# Patient Record
Sex: Male | Born: 1935
Health system: Southern US, Community
[De-identification: ages and names within clinical notes are randomized; demographics above are authoritative.]

## PROBLEM LIST (undated history)

## (undated) ENCOUNTER — Emergency Department (HOSPITAL_COMMUNITY): Discharge status: Absent without leave | Payer: PPO

## (undated) DIAGNOSIS — K219 Gastro-esophageal reflux disease without esophagitis: Secondary | ICD-10-CM

## (undated) DIAGNOSIS — R51 Headache: Secondary | ICD-10-CM

## (undated) DIAGNOSIS — C801 Malignant (primary) neoplasm, unspecified: Secondary | ICD-10-CM

## (undated) DIAGNOSIS — K449 Diaphragmatic hernia without obstruction or gangrene: Secondary | ICD-10-CM

## (undated) DIAGNOSIS — E785 Hyperlipidemia, unspecified: Secondary | ICD-10-CM

## (undated) DIAGNOSIS — J449 Chronic obstructive pulmonary disease, unspecified: Secondary | ICD-10-CM

## (undated) DIAGNOSIS — N400 Enlarged prostate without lower urinary tract symptoms: Secondary | ICD-10-CM

## (undated) DIAGNOSIS — R519 Headache, unspecified: Secondary | ICD-10-CM

## (undated) DIAGNOSIS — H269 Unspecified cataract: Secondary | ICD-10-CM

## (undated) DIAGNOSIS — I509 Heart failure, unspecified: Secondary | ICD-10-CM

## (undated) HISTORY — DX: Heart failure, unspecified: I50.9

## (undated) HISTORY — DX: Hyperlipidemia, unspecified: E78.5

## (undated) HISTORY — PX: OTHER SURGICAL HISTORY: SHX169

## (undated) HISTORY — PX: HERNIA REPAIR: SHX51

## (undated) HISTORY — PX: EYE SURGERY: SHX253

## (undated) HISTORY — DX: Malignant (primary) neoplasm, unspecified: C80.1

## (undated) HISTORY — DX: Benign prostatic hyperplasia without lower urinary tract symptoms: N40.0

## (undated) HISTORY — DX: Headache: R51

## (undated) HISTORY — DX: Unspecified cataract: H26.9

## (undated) HISTORY — DX: Gastro-esophageal reflux disease without esophagitis: K21.9

## (undated) HISTORY — DX: Headache, unspecified: R51.9

## (undated) HISTORY — DX: Diaphragmatic hernia without obstruction or gangrene: K44.9

## (undated) HISTORY — DX: Chronic obstructive pulmonary disease, unspecified: J44.9

---

## 1999-08-13 ENCOUNTER — Encounter (INDEPENDENT_AMBULATORY_CARE_PROVIDER_SITE_OTHER): Payer: Self-pay | Admitting: Specialist

## 1999-08-13 ENCOUNTER — Ambulatory Visit (HOSPITAL_COMMUNITY): Admission: RE | Admit: 1999-08-13 | Discharge: 1999-08-13 | Payer: Self-pay | Admitting: Gastroenterology

## 1999-08-13 ENCOUNTER — Encounter: Payer: Self-pay | Admitting: Gastroenterology

## 1999-10-16 ENCOUNTER — Ambulatory Visit (HOSPITAL_COMMUNITY): Admission: RE | Admit: 1999-10-16 | Discharge: 1999-10-16 | Payer: Self-pay | Admitting: Internal Medicine

## 1999-10-16 ENCOUNTER — Encounter (HOSPITAL_BASED_OUTPATIENT_CLINIC_OR_DEPARTMENT_OTHER): Payer: Self-pay | Admitting: Internal Medicine

## 2000-05-03 ENCOUNTER — Ambulatory Visit (HOSPITAL_COMMUNITY): Admission: RE | Admit: 2000-05-03 | Discharge: 2000-05-03 | Payer: Self-pay

## 2001-11-30 ENCOUNTER — Ambulatory Visit (HOSPITAL_COMMUNITY): Admission: RE | Admit: 2001-11-30 | Discharge: 2001-11-30 | Payer: Self-pay | Admitting: Gastroenterology

## 2001-11-30 ENCOUNTER — Encounter: Payer: Self-pay | Admitting: Gastroenterology

## 2003-08-17 ENCOUNTER — Ambulatory Visit (HOSPITAL_COMMUNITY): Admission: RE | Admit: 2003-08-17 | Discharge: 2003-08-17 | Payer: Self-pay | Admitting: Gastroenterology

## 2004-05-29 ENCOUNTER — Ambulatory Visit: Payer: Self-pay | Admitting: Cardiology

## 2005-01-23 ENCOUNTER — Ambulatory Visit: Payer: Self-pay | Admitting: Gastroenterology

## 2005-01-26 ENCOUNTER — Ambulatory Visit: Payer: Self-pay | Admitting: Gastroenterology

## 2005-03-10 ENCOUNTER — Encounter: Payer: Self-pay | Admitting: Internal Medicine

## 2005-03-10 ENCOUNTER — Ambulatory Visit (HOSPITAL_COMMUNITY): Admission: RE | Admit: 2005-03-10 | Discharge: 2005-03-10 | Payer: Self-pay | Admitting: Family Medicine

## 2005-06-10 ENCOUNTER — Ambulatory Visit (HOSPITAL_COMMUNITY): Admission: RE | Admit: 2005-06-10 | Discharge: 2005-06-10 | Payer: Self-pay | Admitting: Family Medicine

## 2005-06-22 ENCOUNTER — Encounter (HOSPITAL_COMMUNITY): Admission: RE | Admit: 2005-06-22 | Discharge: 2005-09-20 | Payer: Self-pay | Admitting: Dietician

## 2005-12-14 ENCOUNTER — Ambulatory Visit (HOSPITAL_COMMUNITY): Admission: RE | Admit: 2005-12-14 | Discharge: 2005-12-14 | Payer: Self-pay | Admitting: *Deleted

## 2005-12-23 ENCOUNTER — Inpatient Hospital Stay (HOSPITAL_COMMUNITY): Admission: EM | Admit: 2005-12-23 | Discharge: 2005-12-26 | Payer: Self-pay | Admitting: Emergency Medicine

## 2005-12-24 ENCOUNTER — Encounter (INDEPENDENT_AMBULATORY_CARE_PROVIDER_SITE_OTHER): Payer: Self-pay | Admitting: Specialist

## 2005-12-28 ENCOUNTER — Ambulatory Visit: Payer: Self-pay | Admitting: Internal Medicine

## 2006-04-12 ENCOUNTER — Encounter: Payer: Self-pay | Admitting: Internal Medicine

## 2006-06-09 ENCOUNTER — Encounter: Payer: Self-pay | Admitting: Internal Medicine

## 2006-06-09 ENCOUNTER — Ambulatory Visit (HOSPITAL_COMMUNITY): Admission: RE | Admit: 2006-06-09 | Discharge: 2006-06-09 | Payer: Self-pay | Admitting: Family Medicine

## 2007-01-12 ENCOUNTER — Ambulatory Visit: Payer: Self-pay | Admitting: Gastroenterology

## 2007-01-24 ENCOUNTER — Encounter: Payer: Self-pay | Admitting: Internal Medicine

## 2007-01-26 ENCOUNTER — Encounter: Payer: Self-pay | Admitting: Internal Medicine

## 2007-02-09 ENCOUNTER — Ambulatory Visit: Payer: Self-pay | Admitting: Gastroenterology

## 2007-02-09 ENCOUNTER — Encounter: Payer: Self-pay | Admitting: Gastroenterology

## 2007-03-28 DIAGNOSIS — E785 Hyperlipidemia, unspecified: Secondary | ICD-10-CM

## 2007-03-28 DIAGNOSIS — N400 Enlarged prostate without lower urinary tract symptoms: Secondary | ICD-10-CM

## 2007-03-28 DIAGNOSIS — J4489 Other specified chronic obstructive pulmonary disease: Secondary | ICD-10-CM | POA: Insufficient documentation

## 2007-03-28 DIAGNOSIS — K219 Gastro-esophageal reflux disease without esophagitis: Secondary | ICD-10-CM | POA: Insufficient documentation

## 2007-03-28 DIAGNOSIS — J40 Bronchitis, not specified as acute or chronic: Secondary | ICD-10-CM | POA: Insufficient documentation

## 2007-03-28 DIAGNOSIS — J449 Chronic obstructive pulmonary disease, unspecified: Secondary | ICD-10-CM

## 2007-04-28 ENCOUNTER — Ambulatory Visit: Payer: Self-pay | Admitting: Internal Medicine

## 2007-04-28 DIAGNOSIS — J309 Allergic rhinitis, unspecified: Secondary | ICD-10-CM | POA: Insufficient documentation

## 2007-04-28 DIAGNOSIS — J984 Other disorders of lung: Secondary | ICD-10-CM

## 2007-05-26 ENCOUNTER — Ambulatory Visit: Payer: Self-pay | Admitting: Internal Medicine

## 2007-05-26 ENCOUNTER — Encounter: Payer: Self-pay | Admitting: Internal Medicine

## 2007-05-31 ENCOUNTER — Ambulatory Visit: Payer: Self-pay | Admitting: Cardiovascular Disease

## 2007-06-20 ENCOUNTER — Ambulatory Visit: Payer: Self-pay | Admitting: Internal Medicine

## 2007-06-20 DIAGNOSIS — R1310 Dysphagia, unspecified: Secondary | ICD-10-CM | POA: Insufficient documentation

## 2007-09-19 ENCOUNTER — Ambulatory Visit: Payer: Self-pay | Admitting: Internal Medicine

## 2008-02-06 ENCOUNTER — Encounter: Payer: Self-pay | Admitting: Internal Medicine

## 2008-03-22 ENCOUNTER — Ambulatory Visit: Payer: Self-pay | Admitting: Internal Medicine

## 2008-07-18 ENCOUNTER — Encounter: Payer: Self-pay | Admitting: Internal Medicine

## 2009-04-19 ENCOUNTER — Ambulatory Visit: Payer: Self-pay | Admitting: Internal Medicine

## 2009-04-25 ENCOUNTER — Ambulatory Visit: Payer: Self-pay | Admitting: Cardiovascular Disease

## 2009-05-10 ENCOUNTER — Telehealth (INDEPENDENT_AMBULATORY_CARE_PROVIDER_SITE_OTHER): Payer: Self-pay | Admitting: *Deleted

## 2010-04-01 ENCOUNTER — Encounter: Payer: Self-pay | Admitting: Internal Medicine

## 2010-04-08 NOTE — Progress Notes (Signed)
Summary: fax request  Phone Note From Other Clinic   Caller: dr Dorinda Hill moore's office Call For: young Summary of Call: needs CT from 2/17 as well as last ov notes 2/11 faxed to: [phone] Initial call taken by: Tivis Ringer, CNA,  May 10, 2009 10:15 AM  Follow-up for Phone Call        faxed records//Juanita Follow-up by: Darletta Moll,  May 10, 2009 10:21 AM

## 2010-04-08 NOTE — Assessment & Plan Note (Signed)
Summary: 12 months/apc   Copy to:  Dr Jarold Motto Primary Provider/Referring Provider:  Vernon Prey  CC:  Yearly follow up visit.  History of Present Illness: LOV 77 year old man returning for follow up of bronchiectasis and chronic bronchitis.  Sputum culture for AFB was negative.  April 13.  He feels better in dry weather.  Dr.Moore sent old x-ray reports showing scarring in the lung apices.  A CT scan from June 09, 2006 was stable at that time.  Our CT scan of 05/31/2007 showed some bronchitis change and scarring with tree in bud changes consistent with mucous inairways, often a sign of bronchitis, and sometimes an indication of an MAIC.  I think it reflects his chronic bronchitis.  He feels better if he stays out of night air, but says every morning,  he has a congested cough for awhile.  Albuterol irritates his throat but Spiriva helps some.  03/22/08- Rhinitis, COPD,  Bronchitis Notices wheeze early AM. Generally good winter. Avoids "night air". Routine daily productive cough, white mucus. Had seasonal flu vax. Declines H1n1 vax. Meds ok on review. Denies fever, chest pain, sinus discomfort.  April 19, 2009- Rhinitis, COPD, Bronchitis, hx lung nodule "Allergic"  to pneumovax. Had flu vax. Needs refill neb solution. He has been needing it about once daily. If he sits for long he notes joints hurt and he dowsn't breathe as well. Easy dyspnea carrying moderate weight across 20-30 feet. Occasional gas pains. Daily cough productive white mucs. Takes mucinex regularly. Night air and cold air increase his cough. Sneeze with temperature change. He asks status of lung nodule. Has to sleep with extra pillow for orthopnea. Nocturia x 1, without edema. He does a little part time welding, lathing and machine work but doesn't describe it as detrimental to his breathing.     Current Medications (verified): 1)  Caltrate 600+d 600-400 Mg-Unit  Tabs (Calcium Carbonate-Vitamin D) .... Take One Tablet By Mouth  Two Times A Day 2)  Terazosin Hcl 5 Mg  Caps (Terazosin Hcl) .... Take One Capsule By Mouth Once Daily 3)  Proscar 5 Mg  Tabs (Finasteride) .... Take 1 Tablet By Mouth Once A Day 4)  Adult Aspirin Ec Low Strength 81 Mg  Tbec (Aspirin) .... Take 1 Tablet By Mouth Once A Day 5)  Gemfibrozil 600 Mg  Tabs (Gemfibrozil) .... Take 1 Tablet By Mouth Once A Day 6)  Protonix 40 Mg  Pack (Pantoprazole Sodium) .... Take 1 Tablet By Mouth Once A Day 7)  Nasonex 50 Mcg/act  Susp (Mometasone Furoate) .... As Needed 8)  Albuterol Sulfate (2.5 Mg/29ml) 0.083%  Nebu (Albuterol Sulfate) .... One Vial in Nebulizer Every 6 To 8 Hours As Needed 9)  Symbicort 160-4.5 Mcg/act Aero (Budesonide-Formoterol Fumarate) .... 2 Puffs Two Times A Day Rinse Mouth After Use 10)  Mucinex 600 Mg  Tb12 (Guaifenesin) .... Take 1 Tablet By Mouth Once A Day  Allergies (verified): 1)  ! Zocor 2)  ! Zantac 3)  ! Biaxin 4)  ! * Pneumoccal Vaccine  Past History:  Past Medical History: Last updated: 04/28/2007 COPD Pneumonia 2007 Local reaction to pneumovax GERD with esophageal stricture dilatation. Palpitation Recurrent headaches Allergic Rhinitis Hyperlipidemia  Past Surgical History: Last updated: 04/28/2007 Enlarged prostate 3 hernia surgeries  Family History: Last updated: 06/20/2007 Sister- emphysema Brother and son-  died with MI Father- lung ca Mother- ovarian ca brother- liver and colon ca  Social History: Last updated: 04/28/2007 Smoked 20 years, - quit 15 years ago.  Retired Teaching laboratory technician married  Risk Factors: Smoking Status: quit (03/28/2007)  Review of Systems      See HPI       The patient complains of dyspnea on exertion and prolonged cough.  The patient denies anorexia, fever, weight loss, weight gain, vision loss, decreased hearing, hoarseness, chest pain, syncope, peripheral edema, headaches, hemoptysis, and abdominal pain.    Vital Signs:  Patient profile:   77 year old  male Height:      68 inches Weight:      154.50 pounds BMI:     23.58 O2 Sat:      94 % on Room air Pulse rate:   71 / minute BP sitting:   118 / 62  (left arm) Cuff size:   regular  Vitals Entered By: Reynaldo Minium CMA (April 19, 2009 9:41 AM)  O2 Flow:  Room air  Physical Exam  Additional Exam:  General: A/Ox3; pleasant and cooperative, NAD, medium build SKIN: no rash, lesions NODES: no lymphadenopathy HEENT: [place]/AT, EOM- WNL, Conjuctivae- clear, PERRLA, TM-WNL, Nose- clear, Throat- clear and wnl, dentures, [place]  II NECK: Supple w/ fair ROM, JVD- none, normal carotid impulses w/o bruits Thyroid- normal to palpation CHEST: Clear to P&A, minor squeaks and wheeze. HEART: RRR, no m/g/r heard ABDOMEN: Soft and nl; nml bowel sounds; no organomegaly or masses noted ZOX:WRUE, nl pulses, no edema  NEURO: Restless shifting and mild twitching without tremor.      Impression & Recommendations:  Problem # 1:  LUNG NODULE (ICD-518.89)  CT last in 2009 had noted stability of old scarring, but commented on progression of bronchiectasis with some inflammatory tree- in bud changes. We will reassess.  Problem # 2:  COPD (ICD-496) We are refilling neb solution.  Medications Added to Medication List This Visit: 1)  Symbicort 160-4.5 Mcg/act Aero (Budesonide-formoterol fumarate) .... 2 puffs two times a day rinse mouth after use  Other Orders: Est. Patient Level III (45409) Radiology Referral (Radiology)  Patient Instructions: 1)  Schedule return in one year, earlier if needed 2)  Refill neb solution 3)  A Chest CT WITHOUT Contrast has been recommended. Your imaging study may require preauthorization.  Prescriptions: ALBUTEROL SULFATE (2.5 MG/3ML) 0.083%  NEBU (ALBUTEROL SULFATE) one vial in nebulizer every 6 to 8 hours as needed  #50 x prn   Entered and Authorized by:   Waymon Budge MD   Signed by:   Waymon Budge MD on 04/19/2009   Method used:   Print then Give to  Patient   RxID:   [id number]    Immunization History:  Influenza Immunization History:    Influenza:  historical (12/26/2008)

## 2010-04-18 ENCOUNTER — Other Ambulatory Visit: Payer: Self-pay | Admitting: Internal Medicine

## 2010-04-18 ENCOUNTER — Inpatient Hospital Stay: Admission: RE | Admit: 2010-04-18 | Payer: Self-pay | Source: Ambulatory Visit

## 2010-04-18 ENCOUNTER — Encounter: Payer: Self-pay | Admitting: Internal Medicine

## 2010-04-18 ENCOUNTER — Ambulatory Visit (INDEPENDENT_AMBULATORY_CARE_PROVIDER_SITE_OTHER)
Admission: RE | Admit: 2010-04-18 | Discharge: 2010-04-18 | Disposition: A | Discharge status: Home / Self Care | Payer: Medicare Other | Source: Ambulatory Visit | Attending: Internal Medicine | Admitting: Internal Medicine

## 2010-04-18 ENCOUNTER — Ambulatory Visit (INDEPENDENT_AMBULATORY_CARE_PROVIDER_SITE_OTHER): Payer: Medicare Other | Admitting: Internal Medicine

## 2010-04-18 DIAGNOSIS — J449 Chronic obstructive pulmonary disease, unspecified: Secondary | ICD-10-CM

## 2010-04-18 DIAGNOSIS — J309 Allergic rhinitis, unspecified: Secondary | ICD-10-CM

## 2010-04-18 DIAGNOSIS — Z09 Encounter for follow-up examination after completed treatment for conditions other than malignant neoplasm: Secondary | ICD-10-CM

## 2010-04-18 DIAGNOSIS — J40 Bronchitis, not specified as acute or chronic: Secondary | ICD-10-CM

## 2010-04-18 DIAGNOSIS — R0609 Other forms of dyspnea: Secondary | ICD-10-CM

## 2010-04-18 DIAGNOSIS — J984 Other disorders of lung: Secondary | ICD-10-CM

## 2010-04-24 NOTE — Assessment & Plan Note (Signed)
Summary: 1 yr follow up/mbw   Copy to:  Dr Jarold Motto Primary Provider/Referring Provider:  Vernon Prey  CC:  yearly follow up visit-COPD; "always having SOb and wheezing"..  History of Present Illness: 03/22/08- Rhinitis, COPD,  Bronchitis Notices wheeze early AM. Generally good winter. Avoids "night air". Routine daily productive cough, white mucus. Had seasonal flu vax. Declines H1n1 vax. Meds ok on review. Denies fever, chest pain, sinus discomfort.  April 19, 2009- Rhinitis, COPD, Bronchitis, hx lung nodule "Allergic"  to pneumovax. Had flu vax. Needs refill neb solution. He has been needing it about once daily. If he sits for long he notes joints hurt and he dowsn't breathe as well. Easy dyspnea carrying moderate weight across 20-30 feet. Occasional gas pains. Daily cough productive white mucs. Takes mucinex regularly. Night air and cold air increase his cough. Sneeze with temperature change. He asks status of lung nodule. Has to sleep with extra pillow for orthopnea. Nocturia x 1, without edema. He does a little part time welding, lathing and machine work but doesn't describe it as detrimental to his breathing.  April 18, 2010- Rhinitis, COPD, Bronchitis, hx lung nodule Nurse-CC: yearly follow up visit-COPD; "always having SOb and wheezing". CT 2011-IMPRESSION: Reviewed 1.  Stable CT appearance of the chest with chronic lung disease including paraseptal emphysema and biapical scarring. 2.  Chronic bronchiectasis and tree in bud opacity in the right upper lobe compatible with an area of chronic/recurrent infection. 3.  No findings which require specific CT follow-up are identified.// Reports little change in dyspnea or wheeze over several years. not affected by wood shop work. Mostly bothers in his sleep- smothering and some sweats. .  Bedroom kept cool. Denies feet swelling, chest pain- occ chest wall cramp. Daily morning cough. Sputum in past neg for AFB.    Preventive  Screening-Counseling & Management  Alcohol-Tobacco     Smoking Status: quit     Year Quit: 1995  Current Medications (verified): 1)  Caltrate 600+d 600-400 Mg-Unit  Tabs (Calcium Carbonate-Vitamin D) .... Take One Tablet By Mouth Two Times A Day 2)  Terazosin Hcl 5 Mg  Caps (Terazosin Hcl) .... Take One Capsule By Mouth Once Daily 3)  Proscar 5 Mg  Tabs (Finasteride) .... Take 1 Tablet By Mouth Once A Day 4)  Adult Aspirin Ec Low Strength 81 Mg  Tbec (Aspirin) .... Take 1 Tablet By Mouth Once A Day 5)  Gemfibrozil 600 Mg  Tabs (Gemfibrozil) .... Take 1 Tablet By Mouth Once A Day 6)  Protonix 40 Mg  Pack (Pantoprazole Sodium) .... Take 1 Tablet By Mouth Once A Day 7)  Nasonex 50 Mcg/act  Susp (Mometasone Furoate) .... As Needed 8)  Albuterol Sulfate (2.5 Mg/58ml) 0.083%  Nebu (Albuterol Sulfate) .... One Vial in Nebulizer Every 6 To 8 Hours As Needed 9)  Symbicort 160-4.5 Mcg/act Aero (Budesonide-Formoterol Fumarate) .... 2 Puffs Two Times A Day Rinse Mouth After Use 10)  Mucinex 600 Mg  Tb12 (Guaifenesin) .... Take 1 Tablet By Mouth Once A Day  Allergies (verified): 1)  ! Zocor 2)  ! Zantac 3)  ! Biaxin 4)  ! * Pneumoccal Vaccine  Past History:  Past Medical History: Last updated: 04/28/2007 COPD Pneumonia 2007 Local reaction to pneumovax GERD with esophageal stricture dilatation. Palpitation Recurrent headaches Allergic Rhinitis Hyperlipidemia  Past Surgical History: Last updated: 04/28/2007 Enlarged prostate 3 hernia surgeries  Family History: Last updated: 06/20/2007 Sister- emphysema Brother and son-  died with MI Father- lung ca Mother- ovarian  ca brother- liver and colon ca  Social History: Last updated: 04/28/2007 Smoked 20 years, - quit 15 years ago. Retired Teaching laboratory technician married  Risk Factors: Smoking Status: quit (04/18/2010)  Review of Systems       The patient complains of shortness of breath with activity and productive cough.  The  patient denies shortness of breath at rest, non-productive cough, coughing up blood, chest pain, irregular heartbeats, acid heartburn, indigestion, loss of appetite, weight change, abdominal pain, difficulty swallowing, sore throat, tooth/dental problems, headaches, nasal congestion/difficulty breathing through nose, sneezing, hand/feet swelling, joint stiffness or pain, rash, change in color of mucus, and fever.    Vital Signs:  Patient profile:   77 year old male Height:      68 inches Weight:      148.38 pounds BMI:     22.64 O2 Sat:      99 % on Room air Pulse rate:   68 / minute BP sitting:   148 / 60  (right arm) Cuff size:   regular  Vitals Entered By: Reynaldo Minium CMA (April 18, 2010 9:58 AM)  O2 Flow:  Room air CC: yearly follow up visit-COPD; "always having SOb and wheezing".   Physical Exam  Additional Exam:  General: A/Ox3; pleasant and cooperative, NAD, medium build SKIN: no rash, lesions NODES: no lymphadenopathy HEENT: [place]/AT, EOM- WNL, Conjuctivae- clear, PERRLA, TM-WNL, Nose- clear, Throat- clear and wnl, dentures, Mallampati  II NECK: Supple w/ fair ROM, JVD- none, normal carotid impulses w/o bruits Thyroid- normal to palpation CHEST: Clear to P&A, minor squeaks and wheeze. HEART: RRR, no m/g/r heard ABDOMEN: Trim  ZOX:WRUE, nl pulses, no edema  NEURO: Restless shifting and mild twitching without tremor.      Impression & Recommendations:  Problem # 1:  COPD (ICD-496) Chronic bronchitis and empysma. We discussed the suggestion of atypical AFB on previous CT. He uses his meds and nebulizers when he feels he needs. We will update CXR. Consider a f/u CT if there is real concern for progressive bronchiectasis. He emphasizes that he feels quite stable.   Problem # 2:  ALLERGIC RHINITIS (ICD-477.9)  Not currently bothering him so we won't change. His updated medication list for this problem includes:    Nasonex 50 Mcg/act Susp (Mometasone furoate) .Marland Kitchen... As  needed .  Other Orders: Est. Patient Level III (45409) T-2 View CXR (71020TC)  Patient Instructions: 1)  Please schedule a follow-up appointment in 1 year. 2)  A chest x-ray has been recommended.  Your imaging study may require preauthorization.  3)  Continue current meds.

## 2010-06-20 ENCOUNTER — Encounter: Payer: Self-pay | Admitting: Family Medicine

## 2010-07-22 NOTE — Letter (Signed)
February 09, 2007    Evie Lacks, MD  [facility]  [address] [place], [address]  [place], [place] [address]   RE:  Carlos Brooks, Carlos Brooks  MRN:  [id number]  /  DOB:  06/29/1926   Dear Rosanne Ashing:   I would appreciate it if you could see Trecia Rogers for evaluation of  his orogenic oropharyngeal dysphagia.  He has previously seen Casimiro Needle L.  Thad Ranger, M.D. in your office a couple of years ago and plans were to  reassess him in two years.  He continues to have rather severe dysphagia  and episodes of aspiration.  Unfortunately, Mr. Darden does not want to  see Dr. Thad Ranger because of some personality difficulties.  I would  appreciate it if you could see him, review his case, and be sure there  is no progressive neurological disorder accounting for his dysphagia.    Sincerely,      Vania Rea. Jarold Motto, MD, Caleen Essex, FAGA  Electronically Signed    DRP/MedQ  DD: 02/09/2007  DT: 02/09/2007  Job #: [id number]

## 2010-07-25 NOTE — Op Note (Signed)
[facility]  Patient:    Carlos Brooks, Carlos Brooks                    MRN: [id number] Proc. Date: 05/03/00 Adm. Date:  [id number] Attending:  Meredith Leeds                           Operative Report  PREOPERATIVE DIAGNOSIS:  Direct right inguinal hernia.  POSTOPERATIVE DIAGNOSIS:  Direct right inguinal hernia.  OPERATIVE PROCEDURE:  Repair of right inguinal hernia.  SURGEON:  Zigmund Daniel, M.D.  ANESTHESIA:  Local with sedation.  DESCRIPTION OF PROCEDURE:  After the patient was adequately monitored and sedated and after routine preparation and draping the right inguinal region, I liberally infused 0.50% Bupivacaine with epinephrine into the operative field and subsequently gave some local anesthetic as I deepened the dissection. The patient seemed to be comfortable throughout the procedure. I made a slightly oblique skin incision just above and lateral to the pubic tubercle, dissected down through the subcutaneous tissues to the external oblique. I got hemostasis throughout the procedure with the cautery. I opened the external oblique into the external ring, taking care to avoid injury to the ilioinguinal nerve and other cord structures. I then encircled the spermatic cord with a Penrose drain and dissected it up from the inguinal floor and discovered a large direct hernia. I separated that from the cord, reduced it, and held it in with a plug of polypropylene mesh, held in place with a 2-0 silk stitch. I then dissected the proximal part of the spermatic cord and found that there was no evidence of an indirect hernia. I fashioned a patch of polypropylene mesh to fit the inguinal floor with a slit made in the mesh to allow exit of the spermatic cord and then sewed it in from the pubic tubercle medially and superiorly with a basting stitch in the internal oblique fascia and laterally and inferiorly with a running stitch in the inguinal  ligament using 2-0 Prolene suture. I used a single 2-0 Prolene stitch to join the tails of the mesh together just lateral to the spermatic cord, leaving adequate room for exit of the cord. Sponge, needle, lap and instrument counts were correct. I put in a little bit more local anesthetic and assured good hemostasis. I closed the external oblique fascia and subcutaneous tissues with separate running layers of 3-0 Vicryl and I closed the skin with interrupted intracuticular 3-0 Vicryl and Steri-Strips. He tolerated the operation well. DD:  05/03/00 TD:  05/04/00 Job: [id number]

## 2010-07-25 NOTE — Discharge Summary (Signed)
NAMESAHEED, Carlos Brooks             ACCOUNT NO.:  [id number]   MEDICAL RECORD NO.:  [id number]          PATIENT TYPE:  INP   LOCATION:  5732                         FACILITY:  [facility]   PHYSICIAN:  Iva Boop, MD,FACGDATE OF BIRTH:  14-May-1926   DATE OF ADMISSION:  12/23/2005  DATE OF DISCHARGE:  12/26/2005                                 DISCHARGE SUMMARY   ADMITTING DIAGNOSIS:  1. Diarrheal illness, rule out colitis, rule out antritis.  2. History of colon polyps.  Most recent colonoscopy October 2003, at      which time multiple colon polyps and external hemorrhoids noted.  3. History of peptic esophageal stricture.  Latest dilation of esophageal      stricture with an upper endoscopy in November 2006.  4. Hypokalemia.  5. Benign prostatic hypertrophy.  Patient has not undergone TURP but is      status post cystoscopy in the past.  6. Question chronic obstructive pulmonary disease.  7. Stable right lung nodules.  Lingular pulmonary nodules somewhat      increased on recent CT scan of December 14, 2005.  Recommendation was for      follow-up CT in 6 months.  8. History of thyroid nodule.  9. Liver lesions too small to characterize noted on January 2007 CT      abdomen.  10.History of community-acquired pneumonia.  11.Osteoarthritis of the left knee.  12.History of chest pain and palpitations.  Unremarkable treadmill stress      test in 1996 and 2001.  13.Status post single right inguinal hernia repair.  14.Status post left inguinal hernia repair on two occasions.  The patient      thinks that there was mesh enforcement from that second surgery.  15.History of skin reaction owing to pneumococcal vaccine.   DISCHARGE DIAGNOSES:  1. Acute colitis, likely infectious.  Not as likely secondary to      Clostridium Difficile.  2. Hypokalemia, resolved.  3. Minimal anemia, normocytic.   CONSULTATIONS:  None.   PROCEDURES:  Flexible sigmoidoscopy by Dr. Stan Head December 24, 2005.  This showed mild internal hemorrhoids and mild to moderate left-sided  colitis. Appearance of that was that of infectious but not pseudomembranous  colitis.  Activity level was moderate.  Surgical pathology from biopsy of  the region showed active colitis.  Considerations for ischemic colitis  versus NSAID injury were raised on the biopsy.   BRIEF HISTORY:  Carlos Brooks is a pleasant 77 year old white male who is  active and generally healthy.  Beginning 5 days prior to admission, he  developed watery brown, non-bloody stool, associated with fevers, sweats and  rigors. He has become progressively weak.  Some nausea noted of clear watery  emesis.  Also complaining of decreased urine output and low midline  abdominal discomfort.  The patient had not taken any measures to alleviate  the symptoms and did not note increased pain following p.o. intake but stool  output was increased following p.o. intake.   The patient was treated with a course of prednisone about 6 weeks ago for an  upper respiratory infection.  He also notes that, on the day prior to the  onset of symptoms, he did eat a hamburger from a [employer].   LABS:  Hemoglobin 11.5, hematocrit 34.2.  Platelets 290.  White blood cell  count 9.4 with increased monocytes and eosinophils.  Sodium 136, potassium  3.4, corrected to 4.2.  Chloride 102, CO2 28.  Glucose range 92-108.  BUN  14, creatinine 0.8.  Calcium 8.7.  Albumin 3.2, total protein 6.1.  Total  bilirubin 0.6, alkaline phosphatase 50, AST 57, ALT 36.  Stool studies  including culture, C.  Diff, Giardia and Cryptosporidium screen were all  negative.   HOSPITAL COURSE:  1. Acute colitis.  The patient was admitted and placed on a limited clear      liquid diet and IV fluids.  By hospital day 2, he was still passing      multiple loose stools and still feeling quite poorly.  He underwent      flexible sigmoidoscopy on hospital day 2, showing the mildly active       colitis that Dr. Leone Payor felt was due to infectious causes but not      secondary to C.  Diff.  The patient had been started and continued on      IV Cipro and metronidazole, both before and after the flexible      sigmoidoscopy.  At discharge, she was converted over to oral Cipro and      metronidazole to continue an 8-day course.   The patient was with antibiotic therapy and IV fluids.  The patient began to  feel better.  The volume and frequency of bowel movements decreased.  His  appetite was better, and he was felt stable for discharge to home on October  20th.   FOLLOW-UP PLANS:  He is to follow up with Dr. Christell Constant within 2 weeks, and  patient was advised to make that appointment.  He was to call GI physicians  for any severe abdominal pain, blood in stool or return of severe diarrhea.   MEDICATIONS AT DISCHARGE:  Were:  1. Cipro 500 mg twice daily for 8 days.  2. Metronidazole 500 mg twice daily for 8 days.  3. Lomotil 1-2 every 6 hours as needed for diarrhea.  4. Spiriva inhaler 1 puff once daily.  5. Nasonex inhaler 1 puff once daily.  6. Proscar 5 mg once daily.  7. Hytrin 5 mg once daily.  8. Protonix 40 mg once daily.      Jennye Moccasin, PA-C      Iva Boop, MD,FACG  Electronically Signed   SG/MEDQ  D:  01/08/2006  T:  01/08/2006  Job:  [id number]   cc:   Vania Rea. Jarold Motto, MD, Caleen Essex, FAGA  Dr. Christell Constant

## 2010-07-25 NOTE — H&P (Signed)
**Note Carlos Brooks** Carlos Brooks             ACCOUNT NO.:  [id number]   MEDICAL RECORD NO.:  [id number]          PATIENT TYPE:  INP   LOCATION:  5732                         FACILITY:  [facility]   PHYSICIAN:  Iva Boop, MD,FACGDATE OF BIRTH:  07[birth year]27[birth year]   DATE OF ADMISSION:  12/23/2005  DATE OF DISCHARGE:                                HISTORY & PHYSICAL   PRIMARY CARE PHYSICIAN:  Ernestina Penna, M.D.   UROLOGIST:  Maretta Bees. Vonita Moss, M.D.   PULMONOLOGIST:  Caryl Comes. Slotnick, M.D. in [place].   GI PRIMARY:  Vania Rea. Jarold Motto, MD, Clementeen Graham.   CHIEF COMPLAINT:  Several days of diarrhea, weakness, lower abdominal  discomfort.  Recent low potassium on office visits to Dr. Kathi Der office  yesterday.   HISTORY OF PRESENT ILLNESS:  The patient is a nice active generally healthy  77 year old white male.  Since the morning on Saturday which is 5 days prior  to the date of admission, he developed watery, brown, nonbloody stools,  fevers, chills with rigors and sweats.  He feels very weak.  He is nauseated  and either dry heaving or bringing up clear watery deficits.  There is no  blood per rectum or within the emesis. He knows that his urine output has  decreased.  Abdominal pain is in the low midline are and is not severe.   The patient went to Dr. Kathi Der office yesterday.  He was given a bag of IV  fluids; and prescribed Lomotil and Phenergan.  These medications and the IV  fluids helped a little, but he has still has persistent diarrhea and he  feels awful.  The labs, which were obtained came back, and did show a  potassium of 3.0.  It is unclear whether the IV fluids.  She received on the  16th contained potassium or not.  In any event, at re-office visit today,  Dr. Christell Constant was concerned and called [facility] for him to be evaluated here  in [place].  We have advised him to present to the [facility].   The patient has had prior colonoscopy, the latest was in October 2003  at  which time he had multiple colon polyps and external hemorrhoids.  He has  had prior EGDs with dilatation of strictures notably in June of 2001 and  November of 2006.   The patient does say that he ate hamburgers from a placed called [employer]  and from [employer] another restaurant.  The burgers that he had at  [facility] were also shared with cohorts; and none of them are sick.  However, he was the only one who had the hamburger from [facility] that was  within 24 hours of onset of symptoms.  The patient denies the use of  antibiotics recently, but he does think that he has probably had antibiotics  within the last 6 months for upper respiratory infection.  About 6 weeks ago  he was treated with a prednisone taper over about 10-14 days; and says that  it was hard for him to get over his cough.   ALLERGIES:  Known allergies to  pneumococcal vaccine which resulted in a skin  reaction and a large swollen area at the injection site.   MEDICATIONS:  1. Calcium with vitamin D twice daily.  2. Gemfibrozil twice daily.  3. Advair inhaler as needed.  4. Proscar 5 mg once daily.  5. Hytrin 5 mg once daily.  6. Spiriva inhaler 1 puff daily.  7. Nasonex inhaler 1 puff bilaterally once daily.  8. Albuterol nebulizers as needed.  9. Protonix 40 mg a day.  10.Aspirin 81 mg daily.   PAST MEDICAL HISTORY:  1. __________ Question COPD.  2. Lung nodules, those on the right side are stable.  Those in the      lingular area were somewhat increased in size as of a CT chest December 14, 2005 and suggestion was for a followup CT in 6 months.  3. History of thyroid nodule.  4. Liver lesions too small to characterize according to a January 2007 CT      abdomen.  5. Benign prostatic hypertrophy.  He has not had TURP but has undergone      cystoscopy in the past.  6. Community-acquired pneumonia.  7. Status post single right inguinal hernia repair.  8. Status post left inguinal hernia repair x2  occasions.  He thinks there      is mesh in that site.  9. Gastroesophageal reflux disease and esophageal stricture.  10.Colon polyps.  11.Osteoarthritis of the left knee.  12.Chest pain and palpitations.  He had treadmill test in 1996 and 2001      which were unremarkable per notes sent over from Dr. Kathi Der office,      today.   SOCIAL HISTORY:  He is married.  He lives in [place].  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  He has not smoked tobacco for over  20 years.  Does not use alcohol.   FAMILY HISTORY:  Brother was in his 19s when he died with an MI.  Father  died from lung CA.  Mother died from ovarian CA.  Brother died secondary to  colon cancer, and the patient says liver cancer, but it is very possibly  that these were metastasis to the liver from the colon cancer.   REVIEW OF SYSTEMS:  As above, in addition there is been no inappropriate  bleeding.  No inappropriate bruising.  No syncope, though he does feel  tired.  He is not dizzy.  He has had some headaches but no visual changes;  and no unilateral weakness or loss of function of a limbs.  No new cough.  The patient and his wife do describes the patient as being chronically  fidgety in the upper extremities and lower extremities.  Otherwise the  review of systems is unremarkable.   PHYSICAL EXAMINATION:  GENERAL:  The patient is weak and a mildly toxic  looking white male.  He is not obese.  VITAL SIGNS:  Blood pressure 125/79, pulse 75, temperature 97.8,  respirations 20, room air saturation 94%.  HEENT EXAM:  There is no icterus, no pallor, and extraocular movements are  intact.  Oropharynx is dry and clear.  Teeth and dentition are well  repaired.  NECK:  No masses, no JVD, no bruits.  CHEST:  There is scattered scant wheezing.  The patient is NOT short of  breath and there is no cough with deep inspiration. COR:  There is regular rate and rhythm.  No murmurs, rubs, or  gallops.  ABDOMEN:   Soft, quiet, nondistended.  He is tender in the lower abdomen to  mid abdomen bilaterally; and this is greater on the left side.  There is no  guarding or rebound.  No masses, no hepatosplenomegaly, no bruits.  There is  no evidence for recurrent hernias in the groin.  RECTAL EXAM:  No stool no blood and the rectal area looks within normal  limits without erythema.  GU EXAM:  There is no scrotal edema.  EXTREMITIES:  No cyanosis, clubbing or edema.  LYMPH NODES:  No subclavicular or cervical adenopathy.  NEUROLOGIC:  The patient is alert and oriented x3.  He is restless and  moving his upper extremities and lower extremities about, but this is under  voluntary control and he can stop this at will.  PSYCHIATRIC:  The patient is appropriate and pleasant.  DERMATOLOGIC:  Patient is sun tanned.  There is no obvious gross lesions or  rashes on the trunk, lower extremities, or upper extremities.   IMPRESSION:  1. Diarrheal illness, rule out colitis, rule out enteritis.  2. History of colon polyps.  3. History of gastroesophageal reflux disease and esophageal strictures,      dilated in the past.  4. Asthmatic bronchitis.  Stable right lung nodules and somewhat enlarging      lingular lung nodules per CT scan October 8.  He will need a 28-month      follow up CT according to the radiologist.  5. Left thyroid nodule, unknown entity.  6. Status post multiple inguinal hernia repairs.   PLAN:  The patient to be admitted for supportive care including IV fluids,  empiric Flagyl because he may have C. diff.  Provide bowel rest provide  analgesia as needed.  If patient's symptoms do not improve, he will probably  need a flexible sigmoidoscopy in the morning.     ______________________________  Jennye Moccasin, PA-C      Iva Boop, MD,FACG  Electronically Signed    SG/MEDQ  D:  12/24/2005  T:  12/25/2005  Job:  [id number]   cc:   Ernestina Penna, M.D.

## 2010-07-25 NOTE — Op Note (Signed)
[facility]  Patient:    Carlos Brooks, Carlos Brooks                    MRN: [id number] Proc. Date: 08/13/99 Adm. Date:  [id number] Disc. Date: [id number] Attending:  Mardella Layman CC:         Vania Rea. Jarold Motto, M.D. [facility]             Monica Becton, M.D.             Clinton D. Maple Hudson, M.D.                           Operative Report  PROCEDURE:  Outpatient endoscopy and Savary dilation.  INDICATION:  Carlos Brooks is a 77 year old white male with asthmatic chronic bronchitis.  I have seen him in the past for colonic polyposis.  He has a long history of acid reflux disease, was seen in my office on Jul 29, 1999, complaining of progressive solid food dysphagia.  He was restarted on Aciphex 20 mg a day and was scheduled for endoscopy with Savary dilation.  The risks and benefits of this procedure were explained in detail, and he agreed to proceed as planned.  Preoperative cardiopulmonary and mental status exams were unremarkable except for scattered wheezes in both lung fields.  ENDOSCOPY REPORT:  Throughout this procedure, the patient was on pulse oximetry and cardiac monitoring.  He tolerated this procedure well, receiving supplemental low-flow oxygen by nasal cannula throughout the procedure.  He was anesthetized with Cetacaine spray to the oropharynx, fentanyl 50 mcg IV, and Versed 3 mg IV. He was intubated using the Olympus adult video endoscope. Hypopharynx and vocal cords appeared normal.  The esophagus was generally unremarkable except for what appeared to be a possible short segment of Barretts mucosa in the distal esophagus with some nodularity and granularity. There appeared to be a smooth benign stricture of the gastroesophageal junction with a 4-5 cm hiatal hernia present.  The scope passed easily into the stomach.  There were no retained food products or blood in the stomach. The mucosal pattern of the fundus, body, antrum, pylorus, duodenal  bulb, duodenal sweep was normal.  Retroflexed view of the fundus and cardia of the stomach otherwise was unremarkable.  The endoscope was then placed in the antrum of the stomach.  With fluoroscopic control, a floppy-tipped guidewire was inserted through the inner lumen of the endoscope.  The scope was then withdrawn over the guidewire.  With fluoroscopic control, he was then dilated with a #17, and then #18 mm Savary dilators.  These were seen fluoroscopically to pass across the gastroesophageal junction into the stomach.  The patient was then extubated without difficulty.  He tolerated the procedure well.  He was returned in stable condition to the recovery room for observation.  ASSESSMENT: 1. Moderate-sized hiatal hernia with chronic gastroesophageal reflux disease,    rule out Barretts mucosa. 2. Peptic stricture of the distal esophagus secondary to chronic acid reflux. 3. Status post Savary esophageal dilatation.  RECOMMENDATIONS: 1. Strict antireflux regimen along with daily PPI therapy. 2. Follow up on esophageal biopsies. 3. Office follow-up in six weeks- time. DD:  08/13/99 TD:  08/17/99 Job: [id number]

## 2010-09-15 ENCOUNTER — Encounter: Payer: Self-pay | Admitting: Cardiology

## 2011-04-14 ENCOUNTER — Encounter: Payer: Self-pay | Admitting: Internal Medicine

## 2011-04-14 ENCOUNTER — Ambulatory Visit (INDEPENDENT_AMBULATORY_CARE_PROVIDER_SITE_OTHER): Payer: Medicare Other | Admitting: Internal Medicine

## 2011-04-14 VITALS — BP 130/86 | HR 73 | Ht 68.0 in | Wt 152.0 lb

## 2011-04-14 DIAGNOSIS — J4489 Other specified chronic obstructive pulmonary disease: Secondary | ICD-10-CM

## 2011-04-14 DIAGNOSIS — J449 Chronic obstructive pulmonary disease, unspecified: Secondary | ICD-10-CM

## 2011-04-14 MED ORDER — FLUTICASONE-SALMETEROL 45-21 MCG/ACT IN AERO: INHALATION_SPRAY | RESPIRATORY_TRACT | Status: DC

## 2011-04-14 MED ORDER — FAMOTIDINE 20 MG PO TABS: ORAL_TABLET | ORAL | Status: DC

## 2011-04-14 MED ORDER — PREDNISONE (PAK) 10 MG PO TABS: ORAL_TABLET | ORAL | Status: AC

## 2011-04-14 NOTE — Progress Notes (Signed)
  Subjective:    Patient ID: Carlos Brooks, male    DOB: Apr 24, 1926   MRN: [id number]  HPI   Brief patient profile:  46 yowm followed by Dr Maple Hudson with dx of  GOLD II copd 2009  requested transfer care to [facility] office 04/14/2011    04/14/2011 1st pulmonary ov  cc cough daily x 10 years  onset well after stopped smoking with production of  > one tbsp each am  worse with  Winter uri's (but no exacerbations for last year)  c/o cough all day long but not typically disturbing sleep. Has symbicort but  doesn't seem to help and not able to use it constently due to throat irritation.   Has prilosec takes after breakfast daily  Also c/o DOE  X fast walk but not moderate pace x years, occ wakes up at night with breathing difficulty but uses vicks savve and p  usesit on his chest goes back to sleep  and does fine. Has not used the nebulizer in a month but helps when he uses it  Also denies any obvious fluctuation of symptoms with weather or environmental changes or other aggravating or alleviating factors except as outlined above    Review of Systems  Constitutional: Negative for fever and unexpected weight change.  HENT: Positive for congestion and sneezing. Negative for ear pain, nosebleeds, sore throat, rhinorrhea, trouble swallowing, dental problem, postnasal drip and sinus pressure.   Eyes: Negative for redness and itching.  Respiratory: Positive for cough and shortness of breath. Negative for chest tightness and wheezing.   Cardiovascular: Positive for chest pain. Negative for palpitations and leg swelling.  Gastrointestinal: Negative for nausea and vomiting.  Genitourinary: Negative for dysuria.  Musculoskeletal: Negative for joint swelling.  Skin: Negative for rash.  Neurological: Negative for headaches.  Hematological: Does not bruise/bleed easily.  Psychiatric/Behavioral: Negative for dysphoric mood. The patient is not nervous/anxious.        Objective:   Physical Exam  Hoarse amb  wm nad  04/14/2011  152   HEENT mild turbinate edema.  Oropharynx no thrush or excess pnd or cobblestoning.  No JVD or cervical adenopathy. Mild accessory muscle hypertrophy. Trachea midline, nl thryroid. Chest was hyperinflated by percussion with diminished breath sounds and moderate increased exp time with mid exp bilateral  wheeze. Hoover sign positive at mid inspiration. Regular rate and rhythm without murmur gallop or rub or increase P2 or edema.  Abd: no hsm, nl excursion. Ext warm without cyanosis or clubbing.        Assessment & Plan:

## 2011-04-14 NOTE — Patient Instructions (Signed)
Advair 45/21 one twice daily perfectly regularly   Work on inhaler technique:  relax and gently blow all the way out then take a nice smooth deep breath back in, triggering the inhaler at same time you start breathing in.  Hold for up to 5 seconds if you can.  Rinse and gargle with water when done   If your mouth or throat starts to bother you,   I suggest you time the inhaler to your dental care and after using the inhaler(s) brush teeth and tongue with a baking soda containing toothpaste and when you rinse this out, gargle with it first to see if this helps your mouth and throat.     Take prilosec (omeprazole) 40 mg Take 30-60 min before first meal of the day and pepcid 20 mg one at bedtime   GERD (REFLUX)  is an extremely common cause of respiratory symptoms, many times with no significant heartburn at all.    It can be treated with medication, but also with lifestyle changes including avoidance of late meals, excessive alcohol, smoking cessation, and avoid fatty foods, chocolate, peppermint, colas, red wine, and acidic juices such as orange juice.  NO MINT OR MENTHOL PRODUCTS SO NO COUGH DROPS  USE SUGARLESS CANDY INSTEAD (jolley ranchers or Stover's)  NO OIL BASED VITAMINS - use powdered substitutes.  Please schedule a follow up office visit in 4 weeks, sooner if needed

## 2011-04-14 NOTE — Assessment & Plan Note (Addendum)
-   pft's 05/2007   FEV1 1.94 (77%) ratio 58 no better p B2 and dlco 90%   - HFA 50% p coaching 04/14/2011   DDX of  difficult airways managment all start with A and  include Adherence, Ace Inhibitors, Acid Reflux, Active Sinus Disease, Alpha 1 Antitripsin deficiency, Anxiety masquerading as Airways dz,  ABPA,  allergy(esp in young), Aspiration (esp in elderly), Adverse effects of DPI,  Active smokers, plus two Bs  = Bronchiectasis and Beta blocker use..and one C= CHF   Adherence is always the initial "prime suspect" and is a multilayered concern that requires a "trust but verify" approach in every patient - starting with knowing how to use medications, especially inhalers, correctly, keeping up with refills and understanding the fundamental difference between maintenance and prns vs those medications only taken for a very short course and then stopped and not refilled. The proper method of use, as well as anticipated side effects, of this metered-dose inhaler are discussed and demonstrated to the patient. Improved only to 50% with coaching.  Try advair hfa lowest strength and frequency to see if can master hfa and if can tolerate it; otherwise consider change to neb formoterol and budesonide  ? Acid reflux > rec max gerd rx and diet to try to eliminate this as a factor in some of his non-specific symptoms

## 2011-04-16 ENCOUNTER — Telehealth: Payer: Self-pay | Admitting: Internal Medicine

## 2011-04-16 NOTE — Telephone Encounter (Signed)
Received copies from [facility],on 04/16/11. Forwarded  4pages to Dr. Rudell Cobb review.

## 2011-04-20 ENCOUNTER — Ambulatory Visit: Payer: Medicare Other | Admitting: Internal Medicine

## 2011-05-12 ENCOUNTER — Encounter: Payer: Self-pay | Admitting: Internal Medicine

## 2011-05-12 ENCOUNTER — Ambulatory Visit (INDEPENDENT_AMBULATORY_CARE_PROVIDER_SITE_OTHER): Payer: Medicare Other | Admitting: Internal Medicine

## 2011-05-12 VITALS — BP 138/80 | HR 76 | Temp 97.0°F | Ht 68.0 in | Wt 152.0 lb

## 2011-05-12 DIAGNOSIS — J449 Chronic obstructive pulmonary disease, unspecified: Secondary | ICD-10-CM

## 2011-05-12 DIAGNOSIS — J4489 Other specified chronic obstructive pulmonary disease: Secondary | ICD-10-CM

## 2011-05-12 MED ORDER — FLUTICASONE-SALMETEROL 115-21 MCG/ACT IN AERO: INHALATION_SPRAY | RESPIRATORY_TRACT | Status: DC

## 2011-05-12 MED ORDER — LEVALBUTEROL TARTRATE 45 MCG/ACT IN AERO: 1.0000 | INHALATION_SPRAY | RESPIRATORY_TRACT | Status: DC | PRN

## 2011-05-12 NOTE — Assessment & Plan Note (Signed)
-   pft's 05/2007   FEV1 1.94 (77%) ratio 58 no better p B2 and dlco 90%   - HFA 50% p coaching 04/14/2011  > 75% 05/12/2011   GOLD II copd with symptoms improved on low dose advair hfa and on gerd rx.  The proper method of use, as well as anticipated side effects, of this metered-dose inhaler are discussed and demonstrated to the patient. Improved to 75% with coaching so try the higher strength advair 115/41 one bid to see whether cough is completely eliminated in a dose - response fashion.    Each maintenance medication was reviewed in detail including most importantly the difference between maintenance and as needed and under what circumstances the prns are to be used.  Please see instructions for details which were reviewed in writing and the patient given a copy.

## 2011-05-12 NOTE — Progress Notes (Signed)
  Subjective:    Patient ID: Carlos Brooks, male    DOB: 16-Feb-1927   MRN: [id number]  HPI   Brief patient profile:  36 yowm followed by Dr Maple Hudson with dx of  GOLD II copd 2009  requested transfer care to [facility] office 04/14/2011    04/14/2011 1st pulmonary ov  cc cough daily x 10 years  onset well after stopped smoking with production of  > one tbsp each am  worse with  Winter uri's (but no exacerbations for last year)  c/o cough all day long but not typically disturbing sleep. Has symbicort but  doesn't seem to help and not able to use it constently due to throat irritation.   Has prilosec takes after breakfast daily  Also c/o DOE  X fast walk but not moderate pace x years, occ wakes up at night with breathing difficulty but uses vicks savve and p  usesit on his chest goes back to sleep  and does fine. Has not used the nebulizer in a month but helps when he uses it rec Advair 45/21 one twice daily perfectly regularly  Work on inhaler technique:   Take prilosec (omeprazole) 40 mg Take 30-60 min before first meal of the day and pepcid 20 mg one at bedtime   GERD  diet  05/12/2011 f/u ov/Carlos Brooks cc much better cough and breathing on advair 45/21 one bid and hoarsness less but still present on max gerd rx.  No need for daytime rescue saba, not really limited from desired activities by sob  Sleeping ok without nocturnal  or early am exacerbation  of respiratory  c/o's or need for noct saba. Also denies any obvious fluctuation of symptoms with weather or environmental changes or other aggravating or alleviating factors except as outlined above   ROS  At present neg for  any significant sore throat, dysphagia, itching, sneezing,  nasal congestion or excess/ purulent secretions,  fever, chills, sweats, unintended wt loss, pleuritic or exertional cp, hempoptysis, orthopnea pnd or leg swelling.  Also denies presyncope, palpitations, heartburn, abdominal pain, nausea, vomiting, diarrhea  or change in bowel  or urinary habits, dysuria,hematuria,  rash, arthralgias, visual complaints, headache, numbness weakness or ataxia.             Objective:   Physical Exam  Minimally hoarse  amb wm nad  04/14/2011  152  > 152  05/12/2011   HEENT mild turbinate edema.  Oropharynx no thrush or excess pnd or cobblestoning.  No JVD or cervical adenopathy. Mild accessory muscle hypertrophy. Trachea midline, nl thryroid. Chest was hyperinflated by percussion with diminished breath sounds and moderate increased exp time with mid exp bilateral  wheeze. Hoover sign positive at mid inspiration. Regular rate and rhythm without murmur gallop or rub or increase P2 or edema.  Abd: no hsm, nl excursion. Ext warm without cyanosis or clubbing.       cxr reviewed 04/18/10 IMPRESSION: No acute cardiopulmonary disease. Emphysema and  chronic biapical pleuroparenchymal scarring.  Assessment & Plan:

## 2011-05-12 NOTE — Patient Instructions (Addendum)
For cough [place] mucinex every 12 hours as needed  Think of your respiratory medications as multiple steps you can take to control your symptoms and avoid having to go to the ER   Plan A is your maintenance daily no matter what meds: Advair 115/21 one puff first thing  each am and one each pm Plan B only use after you've used your maintenance (Plan A) medication, and only if you can't catch your breath: xopenex HFA up to 2 puffs every  4hous Plan C only use after you've used plan A and B and still can't catch your breath: Nebulizer Xopenex up to 4 hours  Plan D(for Doctor):  If you've used A thru C and not doing a lot better or still needing C more than a once a day,  D = call the doctor for evaluation asap Plan E (for ER):  If still not able to catch your breath, even after using your nebulizer up to every 4 hours, go to ER   Please schedule a follow up office visit in 4 weeks, sooner if needed in [place] office

## 2011-06-09 ENCOUNTER — Ambulatory Visit (INDEPENDENT_AMBULATORY_CARE_PROVIDER_SITE_OTHER): Payer: Medicare Other | Admitting: Internal Medicine

## 2011-06-09 ENCOUNTER — Encounter: Payer: Self-pay | Admitting: Internal Medicine

## 2011-06-09 VITALS — BP 110/70 | HR 74 | Temp 97.8°F | Ht 68.0 in | Wt 151.0 lb

## 2011-06-09 DIAGNOSIS — J449 Chronic obstructive pulmonary disease, unspecified: Secondary | ICD-10-CM

## 2011-06-09 MED ORDER — MOMETASONE FURO-FORMOTEROL FUM 100-5 MCG/ACT IN AERO: INHALATION_SPRAY | RESPIRATORY_TRACT | Status: DC

## 2011-06-09 NOTE — Progress Notes (Signed)
Subjective:    Patient ID: Carlos Brooks, male    DOB: 1926/06/12   MRN: [id number]  HPI   Brief patient profile:  37 yowm quit smoking 1995  followed by Dr Maple Hudson with dx of  GOLD II copd 2009  requested transfer care to [facility] office 04/14/2011    04/14/2011 1st pulmonary ov  cc cough daily x 10 years  onset well after stopped smoking with production of  > one tbsp each am  worse with  Winter uri's (but no exacerbations for last year)  c/o cough all day long but not typically disturbing sleep. Has symbicort but  doesn't seem to help and not able to use it constently due to throat irritation.   Has prilosec takes after breakfast daily  Also c/o DOE  X fast walk but not moderate pace x years, occ wakes up at night with breathing difficulty but uses vicks savve and p  usesit on his chest goes back to sleep  and does fine. Has not used the nebulizer in a month but helps when he uses it rec Advair 45/21 one twice daily perfectly regularly  Work on inhaler technique:   Take prilosec (omeprazole) 40 mg Take 30-60 min before first meal of the day and pepcid 20 mg one at bedtime   GERD  diet  05/12/2011 f/u ov/Atul Delucia cc much better cough and breathing on advair 45/21 one bid and hoarsness less but still present on max gerd rx.  No need for daytime rescue saba, not really limited from desired activities by sob rec For cough [place] mucinex every 12 hours as needed Plan A is your maintenance daily no matter what meds: Advair 115/21 one puff first thing  each am and one each pm Plan B only use after you've used your maintenance (Plan A) medication, and only if you can't catch your breath: xopenex HFA up to 2 puffs every  4hous Plan C only use after you've used plan A and B and still can't catch your breath: Nebulizer Xopenex up to 4 hours  Plan D(for Doctor):  If you've used A thru C and not doing a lot better or still needing C more than a once a day,  D = call the doctor for evaluation asap Plan E (for  ER):   06/09/2011 f/u ov/Aaren Krog cc breathing much better, minimal daytime cough, no excess mucus or limiting activities but still feels needs mucinex daily- no need for saba hfa only, never neb  Sleeping ok without nocturnal  or early am exacerbation  of respiratory  c/o's or need for noct saba. Also denies any obvious fluctuation of symptoms with weather or environmental changes or other aggravating or alleviating factors except as outlined above   ROS  At present neg for  any significant sore throat, dysphagia, itching, sneezing,  nasal congestion or excess/ purulent secretions,  fever, chills, sweats, unintended wt loss, pleuritic or exertional cp, hempoptysis, orthopnea pnd or leg swelling.  Also denies presyncope, palpitations, heartburn, abdominal pain, nausea, vomiting, diarrhea  or change in bowel or urinary habits, dysuria,hematuria,  rash, arthralgias, visual complaints, headache, numbness weakness or ataxia.             Objective:   Physical Exam  Minimally hoarse  amb wm nad  04/14/2011  152  > 152  05/12/2011 > 151 06/09/2011   HEENT mild turbinate edema.  Oropharynx no thrush or excess pnd or cobblestoning.  No JVD or cervical adenopathy. Mild accessory muscle hypertrophy. Trachea  midline, nl thryroid. Chest was hyperinflated by percussion with diminished breath sounds and moderate increased exp time with mid exp bilateral  wheeze. Hoover sign positive at mid inspiration. Regular rate and rhythm without murmur gallop or rub or increase P2 or edema.  Abd: no hsm, nl excursion. Ext warm without cyanosis or clubbing.       cxr reviewed 04/18/10 IMPRESSION: No acute cardiopulmonary disease. Emphysema and  chronic biapical pleuroparenchymal scarring.  Assessment & Plan:

## 2011-06-09 NOTE — Patient Instructions (Signed)
dulera 100 one twice daily should do everything the advair hfa did and also reduce your cough - if not ok to continue advair  Please schedule a follow up visit in 3 months but call sooner if needed

## 2011-06-09 NOTE — Assessment & Plan Note (Addendum)
-   pft's 05/2007   FEV1 1.94 (77%) ratio 58 no better p B2 and dlco 90%   - HFA 90% p teaching 06/09/2011   GOLD II and clinical element of asthma and  Classic Upper airway cough syndrome, so named because it's frequently impossible to sort out how much is  CR/sinusitis with freq throat clearing (which can be related to primary GERD)   vs  causing  secondary (" extra esophageal")  GERD from wide swings in gastric pressure that occur with throat clearing, often  promoting self use of mint and menthol lozenges that reduce the lower esophageal sphincter tone and exacerbate the problem further in a cyclical fashion.   These are the same pts (now being labeled as having "irritable larynx syndrome" by some cough centers) who not infrequently have a history of having failed to tolerate ace inhibitors,  dry powder inhalers or biphosphonates or report having atypical reflux symptoms that don't respond to standard doses of PPI , and are easily confused as having aecopd or asthma flares by even experienced allergists/ pulmonologists.  To try tease out the real from pseudoasthma rec trial of dulera 100 one bid and in meantime emphasized continued diet/ ppi to control GERD    Each maintenance medication was reviewed in detail including most importantly the difference between maintenance and as needed and under what circumstances the prns are to be used.  Please see instructions for details which were reviewed in writing and the patient given a copy.    The proper method of use, as well as anticipated side effects, of a metered-dose inhaler are discussed and demonstrated to the patient. Improved effectiveness after extensive coaching during this visit to a level of approximately  90%

## 2011-07-20 ENCOUNTER — Ambulatory Visit
Admission: RE | Admit: 2011-07-20 | Discharge: 2011-07-20 | Disposition: A | Discharge status: Home / Self Care | Payer: Medicare Other | Source: Ambulatory Visit | Attending: Family Medicine | Admitting: Family Medicine

## 2011-07-20 ENCOUNTER — Other Ambulatory Visit: Payer: Self-pay | Admitting: Family Medicine

## 2011-07-20 DIAGNOSIS — R51 Headache: Secondary | ICD-10-CM

## 2011-10-13 ENCOUNTER — Ambulatory Visit (INDEPENDENT_AMBULATORY_CARE_PROVIDER_SITE_OTHER): Payer: Medicare Other | Admitting: Internal Medicine

## 2011-10-13 ENCOUNTER — Encounter: Payer: Self-pay | Admitting: Internal Medicine

## 2011-10-13 VITALS — BP 136/80 | HR 81 | Temp 97.8°F | Ht 68.0 in | Wt 154.0 lb

## 2011-10-13 DIAGNOSIS — J449 Chronic obstructive pulmonary disease, unspecified: Secondary | ICD-10-CM

## 2011-10-13 MED ORDER — MOMETASONE FURO-FORMOTEROL FUM 100-5 MCG/ACT IN AERO: INHALATION_SPRAY | RESPIRATORY_TRACT | Status: DC

## 2011-10-13 NOTE — Assessment & Plan Note (Signed)
-   pft's 05/2007   FEV1 1.94 (77%) ratio 58 no better p B2 and dlco 90%   - HFA 90% p teaching 06/09/2011   GOLD II and only bothersome symptom is cough which was no better on low dose dulera than low dose advair HFA.  Since advair more assoc with a dose resp curve for cough than dulera would like him to try 100 dosed 2 bid and even then maybe the 200 2bid before giving in and putting up with the chronic am need for mucinex for cough and congestion  See instructions for specific recommendations which were reviewed directly with the patient who was given a copy with highlighter outlining the key components.

## 2011-10-13 NOTE — Progress Notes (Signed)
Subjective:    Patient ID: Carlos Brooks, male    DOB: 20-Dec-1926   MRN: [id number]  HPI   Brief patient profile:  89 yowm quit smoking 1995  followed by Dr Maple Hudson with dx of  GOLD II copd 2009  requested transfer care to [facility] office 04/14/2011 with predominant symptom of cough and doe x fast walk.   04/14/2011 1st pulmonary ov  cc cough daily x 10 years  onset well after stopped smoking with production of  > one tbsp each am  worse with  Winter uri's (but no exacerbations for last year)  c/o cough all day long but not typically disturbing sleep. Has symbicort but  doesn't seem to help and not able to use it constently due to throat irritation.   Has prilosec takes after breakfast daily  Also c/o DOE  X fast walk but not moderate pace x years, occ wakes up at night with breathing difficulty but uses vicks savve and p  usesit on his chest goes back to sleep  and does fine. Has not used the nebulizer in a month but helps when he uses it rec Advair 45/21 one twice daily perfectly regularly  Work on inhaler technique:   Take prilosec (omeprazole) 40 mg Take 30-60 min before first meal of the day and pepcid 20 mg one at bedtime   GERD  diet  05/12/2011 f/u ov/Wert cc much better cough and breathing on advair 45/21 one bid and hoarsness less but still present on max gerd rx.  No need for daytime rescue saba, not really limited from desired activities by sob rec For cough [place] mucinex every 12 hours as needed Plan A is your maintenance daily no matter what meds: Advair 115/21 one puff first thing  each am and one each pm Plan B only use after you've used your maintenance (Plan A) medication, and only if you can't catch your breath: xopenex HFA up to 2 puffs every  4hous Plan C only use after you've used plan A and B and still can't catch your breath: Nebulizer Xopenex up to 4 hours  Plan D(for Doctor):  If you've used A thru C and not doing a lot better or still needing C more than a once a day,  D  = call the doctor for evaluation asap Plan E (for ER):   06/09/2011 f/u ov/Wert cc breathing much better, minimal daytime cough, no excess mucus or limiting activities but still feels needs mucinex daily- no need for saba hfa only, never neb. rec dulera 100 one twice daily should do everything the advair hfa did and also reduce your cough - if not ok to continue advair    10/13/2011 f/u ov/Wert cc chronic cough/congestion no change with dulera 100 so back on advair and takes mucinex one each am and occasional xopenex but never nebulizer and not really limited by sob at this point.  No   purulent sputum or sinus/hb symptoms on present rx.  Sleeping ok without nocturnal  or early am exacerbation  of respiratory  c/o's or need for noct saba. Also denies any obvious fluctuation of symptoms with weather or environmental changes or other aggravating or alleviating factors except as outlined above   ROS  The following are not active complaints unless bolded sore throat, dysphagia, dental problems, itching, sneezing,  nasal congestion or excess/ purulent secretions, ear ache,   fever, chills, sweats, unintended wt loss, pleuritic or exertional cp, hemoptysis,  orthopnea pnd or leg swelling,  presyncope, palpitations, heartburn, abdominal pain, anorexia, nausea, vomiting, diarrhea  or change in bowel or urinary habits, change in stools or urine, dysuria,hematuria,  rash, arthralgias, visual complaints, headache, numbness weakness or ataxia or problems with walking or coordination,  change in mood/affect or memory.    .             Objective:   Physical Exam  Minimally hoarse  amb wm nad  04/14/2011  152  > 152  05/12/2011 > 151 06/09/2011 > 10/13/2011  154   HEENT mild turbinate edema.  Oropharynx no thrush or excess pnd or cobblestoning.  No JVD or cervical adenopathy. Mild accessory muscle hypertrophy. Trachea midline, nl thryroid. Chest was hyperinflated by percussion with diminished breath sounds and  moderate increased exp time with mid exp bilateral  wheeze. Hoover sign positive at mid inspiration. Regular rate and rhythm without murmur gallop or rub or increase P2 or edema.  Abd: no hsm, nl excursion. Ext warm without cyanosis or clubbing.       cxr reviewed 04/18/10 IMPRESSION: No acute cardiopulmonary disease. Emphysema and  chronic biapical pleuroparenchymal scarring.  Assessment & Plan:

## 2011-10-13 NOTE — Patient Instructions (Addendum)
Dulera 100 Take 2 puffs first thing in am and then another 2 puffs about 12 hours later and if you are better then fill the prescription, if not > resume advair  CxR at this point is optional in terms of your care plan  Return every 6 months, sooner if needed

## 2011-12-08 ENCOUNTER — Telehealth: Payer: Self-pay | Admitting: *Deleted

## 2011-12-08 NOTE — Telephone Encounter (Signed)
Dr Kathi Der office called, Joyce Gross to report pt's reflux is getting worse and Dr Christell Constant increased his Omeprazole to 40mg  BID. Dr Christell Constant wants to know if Dr Jarold Motto will add a, EGD to the COLON recall due in 01/2012? Dr Jarold Motto, OK to add EGD to COLON? Thanks.

## 2011-12-16 NOTE — Telephone Encounter (Signed)
Recall added

## 2011-12-16 NOTE — Telephone Encounter (Signed)
yes

## 2012-02-16 ENCOUNTER — Ambulatory Visit (INDEPENDENT_AMBULATORY_CARE_PROVIDER_SITE_OTHER): Payer: Medicare Other | Admitting: Cardiology

## 2012-02-16 ENCOUNTER — Encounter: Payer: Self-pay | Admitting: Cardiology

## 2012-02-16 VITALS — BP 140/72 | HR 54 | Resp 16 | Ht 67.0 in | Wt 152.0 lb

## 2012-02-16 DIAGNOSIS — J449 Chronic obstructive pulmonary disease, unspecified: Secondary | ICD-10-CM

## 2012-02-16 DIAGNOSIS — K219 Gastro-esophageal reflux disease without esophagitis: Secondary | ICD-10-CM

## 2012-02-16 DIAGNOSIS — E7800 Pure hypercholesterolemia, unspecified: Secondary | ICD-10-CM

## 2012-02-16 DIAGNOSIS — J4489 Other specified chronic obstructive pulmonary disease: Secondary | ICD-10-CM

## 2012-02-16 NOTE — Assessment & Plan Note (Signed)
Patient is followed closely in the pulmonary clinic at [facility].  He is presently using Advair HFA 2 puffs twice a day with good results.  He had a recent chest cold and was placed on amoxicillin but stopped it because it caused diarrhea.  He states that this has happened in the past as well.  Patient is a former smoker but has not smoked for many years.

## 2012-02-16 NOTE — Assessment & Plan Note (Signed)
Patient has a history of GERD which is adequately controlled on his current therapy of omeprazole 40 mg daily

## 2012-02-16 NOTE — Assessment & Plan Note (Signed)
She has a history of hypercholesterolemia.  This is followed by Dr. Christell Constant in [place].  The patient is intolerant to all statins.  At the present time he is not on any cholesterol-lowering medicine but is working hard on dietary control.

## 2012-02-16 NOTE — Progress Notes (Signed)
Carlos Brooks Date of Birth:  October 19, 1926 [facility] [address] [place], [place]  [address] [phone]         Fax   [phone]  History of Present Illness: This pleasant 78 year old gentleman is seen after a long absence.  We do not have any prior visits in epic.  We both recalled that the patient had a stress test several years ago which did not show any ischemia.  The patient does not have any history of angina pectoris.  He does have a history of COPD and gets short of breath with walking.  He is followed by Dr. Sherene Sires and Dr. Maple Hudson.  He does not have any history of congestive heart failure.  He has not been aware of many arrhythmias or palpitations.  He does not have any history of ischemic chest pain.  He does get a cramping sensation in the right side of his chest and abdomen periodically if he turns over too quickly.  Current Outpatient Prescriptions  Medication Sig Dispense Refill  . albuterol (PROVENTIL) (2.5 MG/3ML) 0.083% nebulizer solution Take 2.5 mg by nebulization every 6 (six) hours as needed.        Marland Kitchen aspirin 81 MG EC tablet Take 81 mg by mouth daily.        . calcium carbonate (OS-CAL) 600 MG TABS Take 600 mg by mouth 2 (two) times daily with a meal.        . Cholecalciferol (VITAMIN D3) 2000 UNITS TABS Take 1 tablet by mouth daily.        . finasteride (PROSCAR) 5 MG tablet Take 5 mg by mouth daily.        . fluticasone-salmeterol (ADVAIR HFA) 115-21 MCG/ACT inhaler Inhale 2 puffs into the lungs 2 (two) times daily.      Marland Kitchen guaiFENesin (MUCINEX) 600 MG 12 hr tablet Take 1,200 mg by mouth 2 (two) times daily.        Marland Kitchen levalbuterol (XOPENEX HFA) 45 MCG/ACT inhaler Inhale 1 puff into the lungs every 4 (four) hours as needed for wheezing.  1 Inhaler  2  . omeprazole (PRILOSEC) 40 MG capsule Take 40 mg by mouth daily.        Marland Kitchen terazosin (HYTRIN) 5 MG capsule Take 5 mg by mouth at bedtime.          Allergies  Allergen Reactions  . Clarithromycin    . Crestor (Rosuvastatin Calcium)   . Lipitor (Atorvastatin Calcium)   . Niaspan (Niacin Er)   . Pravachol   . Ranitidine Hcl   . Simvastatin     Patient Active Problem List  Diagnosis  . HYPERLIPIDEMIA  . ALLERGIC RHINITIS  . COPD  . LUNG NODULE  . GERD  . DYSPHAGIA UNSPECIFIED  . BENIGN PROSTATIC HYPERTROPHY, HX OF  . Pure hypercholesterolemia    History  Smoking status  . Former Smoker  . Quit date: 04/13/1993  Smokeless tobacco  . Not on file    History  Alcohol Use: Not on file    No family history on file.  Review of Systems: Constitutional: no fever chills diaphoresis or fatigue or change in weight.  Head and neck: no hearing loss, no epistaxis, no photophobia or visual disturbance. Respiratory: No cough, shortness of breath or wheezing. Cardiovascular: No chest pain peripheral edema, palpitations. Gastrointestinal: No abdominal distention, no abdominal pain, no change in bowel habits hematochezia or melena. Genitourinary: No dysuria, no frequency, no urgency, no nocturia. Musculoskeletal:No arthralgias, no back pain,  no gait disturbance or myalgias. Neurological: No dizziness, no headaches, no numbness, no seizures, no syncope, no weakness, no tremors. Hematologic: No lymphadenopathy, no easy bruising. Psychiatric: No confusion, no hallucinations, no sleep disturbance.    Physical Exam: Filed Vitals:   02/16/12 0922  BP: 140/72  Pulse: 54  Resp: 16   the general appearance reveals a well-developed well-nourished gentleman in no distress.The head and neck exam reveals pupils equal and reactive.  Extraocular movements are full.  There is no scleral icterus.  The mouth and pharynx are normal.  The neck is supple.  The carotids reveal no bruits.  The jugular venous pressure is normal.  The  thyroid is not enlarged.  There is no lymphadenopathy.  The chest reveals mild expiratory wheezes . Expansion of the chest is symmetrical.  The precordium is quiet.  The  first heart sound is normal.  The second heart sound is physiologically split.  There is no murmur gallop rub or click.  There is no abnormal lift or heave.  The abdomen is soft and nontender.  The bowel sounds are normal.  The liver and spleen are not enlarged.  There are no abdominal masses.  There are no abdominal bruits.  Extremities reveal good pedal pulses.  There is no phlebitis or edema.  There is no cyanosis or clubbing.  Strength is normal and symmetrical in all extremities.  There is no lateralizing weakness.  There are no sensory deficits.  The skin is warm and dry.  There is no rash.  EKG shows sinus bradycardia and is within normal limits otherwise  Assessment / Plan: The patient is stable from a cardiovascular standpoint.  He is to continue current medication.  Recheck here when necessary.

## 2012-02-16 NOTE — Patient Instructions (Addendum)
Your physician recommends that you continue on your current medications as directed. Please refer to the Current Medication list given to you today.  Follow up as needed  

## 2012-04-12 ENCOUNTER — Ambulatory Visit: Payer: Medicare Other | Admitting: Internal Medicine

## 2012-06-07 ENCOUNTER — Encounter: Payer: Self-pay | Admitting: Internal Medicine

## 2012-06-07 ENCOUNTER — Ambulatory Visit (INDEPENDENT_AMBULATORY_CARE_PROVIDER_SITE_OTHER): Payer: Medicare Other | Admitting: Internal Medicine

## 2012-06-07 VITALS — BP 120/60 | HR 81 | Temp 97.8°F | Ht 68.0 in | Wt 151.0 lb

## 2012-06-07 DIAGNOSIS — J449 Chronic obstructive pulmonary disease, unspecified: Secondary | ICD-10-CM

## 2012-06-07 NOTE — Patient Instructions (Addendum)
For cough [place] mucinex every 12 hours as needed Plan A = automatically is your maintenance daily no matter what meds:  symbicort 160 Take 2 puffs first thing in am and then another 2 puffs about 12 hours later.  Plan B only use after you've used your maintenance (Plan A) medication, and only if you can't catch your breath: xopenex HFA up to 2 puffs every  4hous Plan C only use after you've used plan A and B and still can't catch your breath: Nebulizer Xopenex up to 4 hours    Return in 4 weeks with all your medications, even over the counter meds, separated in two separate bags, the ones you take no matter what vs the ones you stop once you feel better and take only as needed when you feel you need them.     Without this process, it simply isn't possible to assure that we are providing  your outpatient care  with  the attention to detail we feel you deserve.   If we cannot assure that you're getting that kind of care,  then we cannot manage your problem effectively from this clinic.

## 2012-06-07 NOTE — Assessment & Plan Note (Addendum)
-   pft's 05/2007   FEV1 1.94 (77%) ratio 58 no better p B2 and dlco 90%   - HFA 75% p coaching 06/07/2012   He continues to have symptoms on advair hfa   The proper method of use, as well as anticipated side effects, of a metered-dose inhaler are discussed and demonstrated to the patient. Improved effectiveness after extensive coaching during this visit to a level of approximately  75% from baseline of 50%  Try symbicort 160 2bid and address prns on return  He is struggling with concept of maint vs prns and med rec.  To keep things simple, I have asked the patient to first separate medicines that are perceived as maintenance, that is to be taken daily "no matter what", from those medicines that are taken on only on an as-needed basis and I have given the patient examples of both, and then return in one month  See instructions for specific recommendations which were reviewed directly with the patient who was given a copy with highlighter outlining the key components.

## 2012-06-07 NOTE — Progress Notes (Signed)
Subjective:    Patient ID: Carlos Brooks, male    DOB: January 06, 1927   MRN: [id number]      Brief patient profile:  59 yowm quit smoking 1995  followed by Dr Maple Hudson with dx of  GOLD II copd 2009  requested transfer care to [facility] office 04/14/2011 with predominant symptom of cough and doe x fast walk.  HPI 04/14/2011 1st pulmonary ov  cc cough daily x 10 years  onset well after stopped smoking with production of  > one tbsp each am  worse with  Winter uri's (but no exacerbations for last year)  c/o cough all day long but not typically disturbing sleep. Has symbicort but  doesn't seem to help and not able to use it constently due to throat irritation.   Has prilosec takes after breakfast daily  Also c/o DOE  X fast walk but not moderate pace x years, occ wakes up at night with breathing difficulty but uses vicks savve and p  usesit on his chest goes back to sleep  and does fine. Has not used the nebulizer in a month but helps when he uses it rec Advair 45/21 one twice daily perfectly regularly  Work on inhaler technique:   Take prilosec (omeprazole) 40 mg Take 30-60 min before first meal of the day and pepcid 20 mg one at bedtime   GERD  diet  05/12/2011 f/u ov/Wert cc much better cough and breathing on advair 45/21 one bid and hoarsness less but still present on max gerd rx.  No need for daytime rescue saba, not really limited from desired activities by sob rec For cough [place] mucinex every 12 hours as needed Plan A is your maintenance daily no matter what meds: Advair 115/21 one puff first thing  each am and one each pm Plan B only use after you've used your maintenance (Plan A) medication, and only if you can't catch your breath: xopenex HFA up to 2 puffs every  4hous Plan C only use after you've used plan A and B and still can't catch your breath: Nebulizer Xopenex up to 4 hours  Plan D(for Doctor):  If you've used A thru C and not doing a lot better or still needing C more than a once a day,   D = call the doctor for evaluation asap Plan E (for ER):   06/09/2011 f/u ov/Wert cc breathing much better, minimal daytime cough, no excess mucus or limiting activities but still feels needs mucinex daily- no need for saba hfa only, never neb. rec dulera 100 one twice daily should do everything the advair hfa did and also reduce your cough - if not ok to continue advair    10/13/2011 f/u ov/Wert cc chronic cough/congestion no change with dulera 100 so back on advair and takes mucinex one each am and occasional xopenex but never nebulizer and not really limited by sob at this point.  No   purulent sputum or sinus/hb symptoms on present rx. rec No change rx  06/07/2012 f/u ov/Wert/ [place] thoroughly confused with names of  meds/ instructions and maint vs prns/ has some wheezing and congestion each am "better p I use the pump"  But not really limited by breathing.   No obvious daytime variabilty or assoc chronic cough/ purulent sputum or cp or chest tightness, subjective wheeze overt sinus or hb symptoms. No unusual exp hx or h/o childhood pna/ asthma or premature birth to his knowledge.    Sleeping ok without nocturnal  or early am exacerbation  of respiratory  c/o's or need for noct saba. Also denies any obvious fluctuation of symptoms with weather or environmental changes or other aggravating or alleviating factors except as outlined above   ROS  The following are not active complaints unless bolded sore throat, dysphagia, dental problems, itching, sneezing,  nasal congestion or excess/ purulent secretions, ear ache,   fever, chills, sweats, unintended wt loss, pleuritic or exertional cp, hemoptysis,  orthopnea pnd or leg swelling, presyncope, palpitations, heartburn, abdominal pain, anorexia, nausea, vomiting, diarrhea  or change in bowel or urinary habits, change in stools or urine, dysuria,hematuria,  rash, arthralgias, visual complaints, headache, numbness weakness or ataxia or problems with  walking or coordination,  change in mood/affect or memory.    .             Objective:   Physical Exam  Minimally hoarse  amb wm nad  04/14/2011  152  > 152  05/12/2011 > 151 06/09/2011 > 10/13/2011  154 > 06/07/2012  151   HEENT mild turbinate edema.  Oropharynx no thrush or excess pnd or cobblestoning.  No JVD or cervical adenopathy. Mild accessory muscle hypertrophy. Trachea midline, nl thryroid. Chest was hyperinflated by percussion with diminished breath sounds and moderate increased exp time with mid exp bilateral  wheeze. Hoover sign positive at mid inspiration. Regular rate and rhythm without murmur gallop or rub or increase P2 or edema.  Abd: no hsm, nl excursion. Ext warm without cyanosis or clubbing.    CXR  reviewed 04/18/10 IMPRESSION: No acute cardiopulmonary disease. Emphysema and  chronic biapical pleuroparenchymal scarring.  Assessment & Plan:

## 2012-06-30 ENCOUNTER — Ambulatory Visit (INDEPENDENT_AMBULATORY_CARE_PROVIDER_SITE_OTHER): Payer: Medicare Other

## 2012-06-30 ENCOUNTER — Ambulatory Visit (INDEPENDENT_AMBULATORY_CARE_PROVIDER_SITE_OTHER): Payer: Medicare Other | Admitting: Family Medicine

## 2012-06-30 ENCOUNTER — Encounter: Payer: Self-pay | Admitting: Family Medicine

## 2012-06-30 ENCOUNTER — Telehealth: Payer: Self-pay | Admitting: Family Medicine

## 2012-06-30 VITALS — BP 149/84 | HR 104 | Temp 100.1°F | Ht 67.0 in | Wt 150.0 lb

## 2012-06-30 DIAGNOSIS — R509 Fever, unspecified: Secondary | ICD-10-CM

## 2012-06-30 DIAGNOSIS — E785 Hyperlipidemia, unspecified: Secondary | ICD-10-CM

## 2012-06-30 DIAGNOSIS — K219 Gastro-esophageal reflux disease without esophagitis: Secondary | ICD-10-CM

## 2012-06-30 DIAGNOSIS — R05 Cough: Secondary | ICD-10-CM

## 2012-06-30 DIAGNOSIS — J209 Acute bronchitis, unspecified: Secondary | ICD-10-CM

## 2012-06-30 DIAGNOSIS — R350 Frequency of micturition: Secondary | ICD-10-CM

## 2012-06-30 DIAGNOSIS — J449 Chronic obstructive pulmonary disease, unspecified: Secondary | ICD-10-CM

## 2012-06-30 LAB — POCT URINALYSIS DIPSTICK
Bilirubin, UA: NEGATIVE
Nitrite, UA: NEGATIVE
Protein, UA: NEGATIVE
pH, UA: 6.5

## 2012-06-30 LAB — POCT CBC
Hemoglobin: 15.1 g/dL (ref 14.1–18.1)
Lymph, poc: 1.7 (ref 0.6–3.4)
MCH, POC: 30.5 pg (ref 27–31.2)
MCHC: 34.3 g/dL (ref 31.8–35.4)
MPV: 7.9 fL (ref 0–99.8)
POC Granulocyte: 9.9 — AB (ref 2–6.9)
POC LYMPH PERCENT: 14.4 %L (ref 10–50)
RDW, POC: 14 %
WBC: 11.9 10*3/uL — AB (ref 4.6–10.2)

## 2012-06-30 LAB — POCT UA - MICROSCOPIC ONLY
Bacteria, U Microscopic: NEGATIVE
Casts, Ur, LPF, POC: NEGATIVE
Crystals, Ur, HPF, POC: NEGATIVE
Mucus, UA: NEGATIVE
Yeast, UA: NEGATIVE

## 2012-06-30 MED ORDER — ALBUTEROL SULFATE (2.5 MG/3ML) 0.083% IN NEBU
2.5000 mg | INHALATION_SOLUTION | RESPIRATORY_TRACT | Status: AC
  Administered 2012-06-30: 2.5 mg via RESPIRATORY_TRACT

## 2012-06-30 MED ORDER — CEFUROXIME AXETIL 250 MG PO TABS: 250.0000 mg | ORAL_TABLET | Freq: Two times a day (BID) | ORAL | Status: DC

## 2012-06-30 MED ORDER — BUDESONIDE 0.5 MG/2ML IN SUSP
0.2500 mg | Freq: Once | RESPIRATORY_TRACT | Status: AC
  Administered 2012-06-30: 0.25 mg via RESPIRATORY_TRACT

## 2012-06-30 NOTE — Progress Notes (Addendum)
Subjective:    Patient ID: Carlos Brooks, male    DOB: [birth year], 79 y.o.   MRN: [id number]  HPI This patient presents for cough congestion and fever and a recheck of multiple medical problems. He has had chills, fever , cough, and sore throat. Sputum is white and yellow.   Patient Active Problem List  Diagnosis  . HYPERLIPIDEMIA  . ALLERGIC RHINITIS  . COPD  . LUNG NODULE  . GERD  . DYSPHAGIA UNSPECIFIED  . BENIGN PROSTATIC HYPERTROPHY, HX OF  . Pure hypercholesterolemia    In addition, See ROS.  The allergies, current medications, past medical history, surgical history, family and social history are reviewed.         Review of Systems  Constitutional: Positive for fever, chills and fatigue.  HENT: Positive for congestion and sinus pressure.   Respiratory: Positive for cough, chest tightness and wheezing.   Genitourinary: Positive for frequency.  Musculoskeletal: Positive for myalgias (cramping).  Neurological: Positive for headaches.  Psychiatric/Behavioral: Positive for sleep disturbance (due to cough and fever).       Objective:   Physical Exam  Nursing note and vitals reviewed. Constitutional: He is oriented to person, place, and time. He appears well-developed and well-nourished.  HENT:  Head: Normocephalic and atraumatic.  Right Ear: External ear normal.  Left Ear: External ear normal.  Mouth/Throat: Oropharynx is clear and moist. No oropharyngeal exudate.  Nasal turbinate congestion  Eyes: Conjunctivae are normal. Right eye exhibits no discharge. Left eye exhibits no discharge. Scleral icterus is present.  Neck: Normal range of motion. No thyromegaly present.  Cardiovascular: Normal rate, regular rhythm and normal heart sounds.  Exam reveals no gallop and no friction rub.   No murmur heard. Pulmonary/Chest: Effort normal. No respiratory distress. He has wheezes. He has no rales.  Musculoskeletal: He exhibits no edema.  Lymphadenopathy:    He has  no cervical adenopathy.  Neurological: He is alert and oriented to person, place, and time. He has normal reflexes.  Skin: Skin is warm and dry.  Psychiatric: He has a normal mood and affect. His behavior is normal. Judgment and thought content normal.   [facility] reading (PRIMARY) by  Dr. Christell Constant: chest x-ray shows bronchitic changes                                  Results for orders placed in visit on 06/30/12  POCT CBC      Result Value Range   WBC 11.9 (*) 4.6 - 10.2 K/uL   Lymph, poc 1.7  0.6 - 3.4   POC LYMPH PERCENT 14.4  10 - 50 %L   POC Granulocyte 9.9 (*) 2 - 6.9   Granulocyte percent 83.1 (*) 37 - 80 %G   RBC 5.0  4.69 - 6.13 M/uL   Hemoglobin 15.1  14.1 - 18.1 g/dL   HCT, POC 40.9  81.1 - 53.7 %   MCV 88.9  80 - 97 fL   MCH, POC 30.5  27 - 31.2 pg   MCHC 34.3  31.8 - 35.4 g/dL   RDW, POC 91.4     Platelet Count, POC 172.0  142 - 424 K/uL   MPV 7.9  0 - 99.8 fL  POCT URINALYSIS DIPSTICK      Result Value Range   Color, UA yellow     Clarity, UA clear     Glucose, UA neg  Bilirubin, UA neg     Ketones, UA neg     Spec Grav, UA 1.010     Blood, UA mod     pH, UA 6.5     Protein, UA neg     Urobilinogen, UA negative     Nitrite, UA neg     Leukocytes, UA Negative    POCT UA - MICROSCOPIC ONLY      Result Value Range   WBC, Ur, HPF, POC neg     RBC, urine, microscopic 1-5     Bacteria, U Microscopic neg     Mucus, UA neg     Epithelial cells, urine per micros neg     Crystals, Ur, HPF, POC neg     Casts, Ur, LPF, POC neg     Yeast, UA neg            Assessment & Plan:  1. Cough - POCT CBC - DG Chest 2 View - cefUROXime (CEFTIN) 250 MG tablet; Take 1 tablet (250 mg total) by mouth 2 (two) times daily.  Dispense: 20 tablet; Refill: 0 - albuterol (PROVENTIL) (2.5 MG/3ML) 0.083% nebulizer solution 2.5 mg; Take 3 mLs (2.5 mg total) by nebulization now.  2. Fever, unspecified - POCT CBC - DG Chest 2 View - cefUROXime (CEFTIN) 250 MG tablet; Take 1 tablet  (250 mg total) by mouth 2 (two) times daily.  Dispense: 20 tablet; Refill: 0  3. Urinary frequency - POCT CBC - POCT urinalysis dipstick - POCT UA - Microscopic Only  4. COPD - budesonide (PULMICORT) nebulizer solution 0.25 mg; Take 1 mL (0.25 mg total) by nebulization once.  5. GERD  6. HYPERLIPIDEMIA  7. Acute bronchitis  Patient Instructions  Drink plenty of fluids. Take medication as directed Take Tylenol for aches pains and fever use nebulizer at home with albuterol 3 or 4 times a day Get some rest

## 2012-06-30 NOTE — Patient Instructions (Addendum)
Drink plenty of fluids. Take medication as directed Take Tylenol for aches pains and fever use nebulizer at home with albuterol 3 or 4 times a day Get some rest

## 2012-06-30 NOTE — Addendum Note (Signed)
Addended by: Almeta Monas on: 06/30/2012 03:46 PM   Modules accepted: Orders

## 2012-06-30 NOTE — Telephone Encounter (Signed)
APPT MADE

## 2012-07-12 ENCOUNTER — Ambulatory Visit (INDEPENDENT_AMBULATORY_CARE_PROVIDER_SITE_OTHER): Payer: Medicare Other | Admitting: Internal Medicine

## 2012-07-12 ENCOUNTER — Encounter: Payer: Self-pay | Admitting: Internal Medicine

## 2012-07-12 VITALS — BP 130/70 | HR 74 | Temp 98.0°F | Ht 68.0 in | Wt 151.0 lb

## 2012-07-12 DIAGNOSIS — J449 Chronic obstructive pulmonary disease, unspecified: Secondary | ICD-10-CM

## 2012-07-12 MED ORDER — BUDESONIDE-FORMOTEROL FUMARATE 160-4.5 MCG/ACT IN AERO: 2.0000 | INHALATION_SPRAY | Freq: Two times a day (BID) | RESPIRATORY_TRACT | Status: DC

## 2012-07-12 MED ORDER — LEVALBUTEROL TARTRATE 45 MCG/ACT IN AERO: 1.0000 | INHALATION_SPRAY | RESPIRATORY_TRACT | Status: DC | PRN

## 2012-07-12 NOTE — Patient Instructions (Addendum)
For cough use mucinex up to 1200 mg every 12 hours as needed  Work on inhaler technique:  relax and gently blow all the way out then take a nice smooth deep breath back in, triggering the inhaler at same time you start breathing in.  Hold for up to 5 seconds if you can.  Rinse and gargle with water when done      Plan A = automatically is your maintenance daily no matter what meds:  symbicort 160 Take 2 puffs first thing in am and then another 2 puffs about 12 hours later.  Plan B only use after you've used your maintenance (Plan A) medication, and only if you can't catch your breath: xopenex HFA up to 2 puffs every  4hous Plan C only use after you've used plan A and B and still can't catch your breath: Nebulizer albuterol or xopenex up to 4 hours if needed  Omeprazole 40 mg Take 30-60 min before first meal of the day  - if you start having a lot of cough or wheeze go ahead and use it twice daily by adding a second pill before supper

## 2012-07-12 NOTE — Progress Notes (Signed)
Subjective:    Patient ID: Carlos Brooks, male    DOB: March 23, 1926   MRN: [id number]      Brief patient profile:  37 yowm quit smoking 1995  followed by Dr Maple Hudson with dx of  GOLD II copd 2009  requested transfer care to [facility] office 04/14/2011 with predominant symptom of cough and doe x fast walk.  HPI 04/14/2011 1st pulmonary ov  cc cough daily x 10 years  onset well after stopped smoking with production of  > one tbsp each am  worse with  Winter uri's (but no exacerbations for last year)  c/o cough all day long but not typically disturbing sleep. Has symbicort but  doesn't seem to help and not able to use it constently due to throat irritation.   Has prilosec takes after breakfast daily  Also c/o DOE  X fast walk but not moderate pace x years, occ wakes up at night with breathing difficulty but uses vicks savve and p  usesit on his chest goes back to sleep  and does fine. Has not used the nebulizer in a month but helps when he uses it rec Advair 45/21 one twice daily perfectly regularly  Work on inhaler technique:   Take prilosec (omeprazole) 40 mg Take 30-60 min before first meal of the day and pepcid 20 mg one at bedtime   GERD  diet  05/12/2011 f/u ov/Magali Bray cc much better cough and breathing on advair 45/21 one bid and hoarsness less but still present on max gerd rx.  No need for daytime rescue saba, not really limited from desired activities by sob rec For cough [place] mucinex every 12 hours as needed Plan A is your maintenance daily no matter what meds: Advair 115/21 one puff first thing  each am and one each pm Plan B only use after you've used your maintenance (Plan A) medication, and only if you can't catch your breath: xopenex HFA up to 2 puffs every  4hous Plan C only use after you've used plan A and B and still can't catch your breath: Nebulizer Xopenex up to 4 hours  Plan D(for Doctor):  If you've used A thru C and not doing a lot better or still needing C more than a once a day,   D = call the doctor for evaluation asap Plan E (for ER):   06/09/2011 f/u ov/Eragon Hammond cc breathing much better, minimal daytime cough, no excess mucus or limiting activities but still feels needs mucinex daily- no need for saba hfa only, never neb. rec dulera 100 one twice daily should do everything the advair hfa did and also reduce your cough - if not ok to continue advair    10/13/2011 f/u ov/Gunhild Bautch cc chronic cough/congestion no change with dulera 100 so back on advair and takes mucinex one each am and occasional xopenex but never nebulizer and not really limited by sob at this point.  No   purulent sputum or sinus/hb symptoms on present rx. rec No change rx  06/07/2012 f/u ov/Arra Connaughton/ [place] thoroughly confused with names of  meds/ instructions and maint vs prns/ has some wheezing and congestion each am "better p I use the pump"  But not really limited by breathing.  rec Plan A thru E reviewed to include symbicor 160 2bid   07/12/2012 f/u ov/Harlee Eckroth re copd management Chief Complaint  Patient presents with  . Follow-up    for copd.    flared x 2 weeks with cough prod of yellow mucus  rx with ceftin and improved but not following instructions on use of rescue for flare.  No sob over baseline, no need for neb since last ov  No obvious daytime variabilty or assoc chronic cough/ purulent sputum or cp or chest tightness, subjective wheeze overt sinus or hb symptoms. No unusual exp hx or h/o childhood pna/ asthma or premature birth to his knowledge.    Sleeping ok without nocturnal  or early am exacerbation  of respiratory  c/o's or need for noct saba. Also denies any obvious fluctuation of symptoms with weather or environmental changes or other aggravating or alleviating factors except as outlined above   Current Medications, Allergies, Past Medical History, Past Surgical History, Family History, and Social History were reviewed in [employer] record.  ROS  The following are not  active complaints unless bolded sore throat, dysphagia, dental problems, itching, sneezing,  nasal congestion or excess/ purulent secretions, ear ache,   fever, chills, sweats, unintended wt loss, pleuritic or exertional cp, hemoptysis,  orthopnea pnd or leg swelling, presyncope, palpitations, heartburn, abdominal pain, anorexia, nausea, vomiting, diarrhea  or change in bowel or urinary habits, change in stools or urine, dysuria,hematuria,  rash, arthralgias, visual complaints, headache, numbness weakness or ataxia or problems with walking or coordination,  change in mood/affect or memory.                Objective:   Physical Exam  Minimally hoarse  amb wm nad  04/14/2011  152  > 152  05/12/2011 > 151 06/09/2011 > 10/13/2011  154 > 06/07/2012  151 > 07/12/2012  151  HEENT mild turbinate edema.  Oropharynx no thrush or excess pnd or cobblestoning.  No JVD or cervical adenopathy. Mild accessory muscle hypertrophy. Trachea midline, nl thryroid. Chest was hyperinflated by percussion with diminished breath sounds and moderate increased exp time with mid exp bilateral sonorous wheeze. Hoover sign positive at mid inspiration. Regular rate and rhythm without murmur gallop or rub or increase P2 or edema.  Abd: no hsm, nl excursion. Ext warm without cyanosis or clubbing.    CXR  reviewed 06/30/12 Biapical pleural/parenchymal scarring, stable.   Assessment & Plan:

## 2012-07-13 NOTE — Assessment & Plan Note (Addendum)
-   pft's 05/2007   FEV1 1.94 (77%) ratio 58 no better p B2 and dlco 90%   - HFA  75% p coaching 07/13/2012   Overall better on symbicort but still not understanding prns well  The proper method of use, as well as anticipated side effects, of a metered-dose inhaler are discussed and demonstrated to the patient. Improved effectiveness after extensive coaching during this visit to a level of approximately  75%   I had an extended discussion with the patient today lasting 15 to 20 minutes of a 25 minute visit on the following issues:    Each maintenance medication was reviewed in detail including most importantly the difference between maintenance and as needed and under what circumstances the prns are to be used (including second dose of ppi in event cough or wheezing flare on standard doses of symbicort .  Please see instructions for details which were reviewed in writing and the patient given a copy.

## 2012-08-04 ENCOUNTER — Encounter: Payer: Self-pay | Admitting: Family Medicine

## 2012-08-04 ENCOUNTER — Ambulatory Visit (INDEPENDENT_AMBULATORY_CARE_PROVIDER_SITE_OTHER): Payer: Medicare Other | Admitting: Family Medicine

## 2012-08-04 VITALS — BP 135/69 | HR 63 | Temp 96.7°F | Ht 67.0 in | Wt 148.4 lb

## 2012-08-04 DIAGNOSIS — J309 Allergic rhinitis, unspecified: Secondary | ICD-10-CM

## 2012-08-04 DIAGNOSIS — R5381 Other malaise: Secondary | ICD-10-CM

## 2012-08-04 DIAGNOSIS — K219 Gastro-esophageal reflux disease without esophagitis: Secondary | ICD-10-CM

## 2012-08-04 DIAGNOSIS — J449 Chronic obstructive pulmonary disease, unspecified: Secondary | ICD-10-CM

## 2012-08-04 DIAGNOSIS — E559 Vitamin D deficiency, unspecified: Secondary | ICD-10-CM

## 2012-08-04 DIAGNOSIS — R5383 Other fatigue: Secondary | ICD-10-CM

## 2012-08-04 DIAGNOSIS — E785 Hyperlipidemia, unspecified: Secondary | ICD-10-CM

## 2012-08-04 LAB — POCT CBC
Granulocyte percent: 72.9 %G (ref 37–80)
Lymph, poc: 2.3 (ref 0.6–3.4)
MPV: 8.2 fL (ref 0–99.8)
POC LYMPH PERCENT: 22.3 %L (ref 10–50)
Platelet Count, POC: 160 10*3/uL (ref 142–424)
RBC: 4.9 M/uL (ref 4.69–6.13)

## 2012-08-04 LAB — BASIC METABOLIC PANEL WITH GFR
Chloride: 102 mEq/L (ref 96–112)
Creat: 1.02 mg/dL (ref 0.50–1.35)
GFR, Est Non African American: 67 mL/min
Potassium: 4.7 mEq/L (ref 3.5–5.3)

## 2012-08-04 LAB — HEPATIC FUNCTION PANEL
Albumin: 4.4 g/dL (ref 3.5–5.2)
Total Protein: 6.8 g/dL (ref 6.0–8.3)

## 2012-08-04 LAB — THYROID PANEL WITH TSH: T3 Uptake: 33.7 % (ref 22.5–37.0)

## 2012-08-04 NOTE — Addendum Note (Signed)
Addended by: Lisbeth Ply C on: 08/04/2012 11:51 AM   Modules accepted: Orders

## 2012-08-04 NOTE — Progress Notes (Signed)
Subjective:    Patient ID: Carlos Brooks, male    DOB: [birth year], 79 y.o.   MRN: [id number]  HPI This patient presents for recheck of multiple medical problems. His wife accompanies the patient today.  Patient Active Problem List   Diagnosis Date Noted  . Pure hypercholesterolemia 02/16/2012  . DYSPHAGIA UNSPECIFIED 06/20/2007  . ALLERGIC RHINITIS 04/28/2007  . LUNG NODULE 04/28/2007  . HYPERLIPIDEMIA 03/28/2007  . COPD 03/28/2007  . GERD 03/28/2007  . BENIGN PROSTATIC HYPERTROPHY, HX OF 03/28/2007    In addition, see review of systems. His wife indicates when he did check 2 nevi on his back. He uses Symbicort regularly twice daily, and he has an albuterol that he uses as a rescue inhaler. He also takes Mucinex fairly regularly.  The allergies, current medications, past medical history, surgical history, family and social history are reviewed.  Immunizations reviewed.  Health maintenance reviewed.  The following items are outstanding: None      Review of Systems  Constitutional: Positive for fatigue.  HENT: Positive for ear pain (fullness), congestion, sneezing, postnasal drip and sinus pressure (slight). Negative for sore throat.   Eyes: Positive for itching (due to allergies).  Respiratory: Positive for cough and wheezing (slight). Negative for shortness of breath.   Cardiovascular: Negative.   Gastrointestinal: Positive for abdominal pain (LLQ occasional). Negative for constipation.  Endocrine: Negative.   Genitourinary: Negative.   Musculoskeletal: Positive for myalgias (R arm and R leg) and arthralgias (bilateral knees,).  Allergic/Immunologic: Positive for environmental allergies (seasonal).  Neurological: Positive for headaches.       Objective:   Physical Exam BP 135/69  Pulse 63  Temp(Src) 96.7 F (35.9 C) (Oral)  Ht 5\' 7"  (1.702 m)  Wt 148 lb 6.4 oz (67.314 kg)  BMI 23.24 kg/m2  The patient appeared well nourished and normally developed for his  age, alert and oriented to time and place. Speech, behavior and judgement appear normal. Vital signs as documented.  Head exam is unremarkable. No scleral icterus or pallor noted. He does have some head congestion. His mouth and throat were normal with dentures in place. TMs were normal Neck is without jugular venous distension, thyromegally, or carotid bruits. Carotid upstrokes are brisk bilaterally. No cervical adenopathy. Lungs are clear anteriorly and posteriorly to auscultation. The lungs were actually clear than usual for Carlos Brooks. Normal respiratory effort. Cardiac exam reveals regular rate and rhythm at 60 per minute. First and second heart sounds normal.  No murmurs, rubs or gallops.  Abdominal exam reveals normal bowl sounds, no masses, no organomegaly and no aortic enlargement. No inguinal adenopathy. Minimal tenderness in the epigastric area. Extremities are nonedematous and both femoral  are normal. Skin without pallor or jaundice.  Warm and dry, without rash. Neurologic exam reveals normal deep tendon reflexes and normal sensation.          Assessment & Plan:  1. Vitamin D deficiency - Vitamin D 25 hydroxy; Standing  2. Fatigue - POCT CBC; Standing - BASIC METABOLIC PANEL WITH GFR; Standing - Thyroid Panel With TSH  3. Hyperlipidemia - NMR Lipoprofile with Lipids; Standing - Hepatic function panel; Standing  4. ALLERGIC RHINITIS  5. COPD -Try [place] sample as directed  6. GERD Continue Prilosec  7. HYPERLIPIDEMIA Continue aggressive therapeutic lifestyle changes   Patient Instructions  Continue current medications.  Drink plenty of fluids. Try and use new inhaler 1 puff twice daily as directed, see if this helps breathing any Continue aggressive therapeutic lifestyle changes as  much as possible

## 2012-08-04 NOTE — Patient Instructions (Signed)
Continue current medications.  Drink plenty of fluids. Try and use new inhaler 1 puff twice daily as directed, see if this helps breathing any Continue aggressive therapeutic lifestyle changes as much as possible

## 2012-08-05 LAB — VITAMIN D 25 HYDROXY (VIT D DEFICIENCY, FRACTURES): Vit D, 25-Hydroxy: 49 ng/mL (ref 30–89)

## 2012-08-08 LAB — NMR LIPOPROFILE WITH LIPIDS
HDL Size: 8.6 nm — ABNORMAL LOW (ref >=9.2)
HDL-C: 45 mg/dL (ref >=40)
LDL (calc): 120 mg/dL — ABNORMAL HIGH (ref <100)
LDL Size: 20.3 nm — ABNORMAL LOW (ref >20.5)
LP-IR Score: 54 — ABNORMAL HIGH (ref <=45)

## 2012-08-22 ENCOUNTER — Encounter: Payer: Self-pay | Admitting: Physician Assistant

## 2012-08-22 ENCOUNTER — Telehealth: Payer: Self-pay | Admitting: Family Medicine

## 2012-08-22 ENCOUNTER — Ambulatory Visit (INDEPENDENT_AMBULATORY_CARE_PROVIDER_SITE_OTHER): Payer: Medicare Other | Admitting: Physician Assistant

## 2012-08-22 VITALS — BP 138/72 | HR 82 | Temp 97.3°F | Ht 66.0 in | Wt 147.0 lb

## 2012-08-22 DIAGNOSIS — J440 Chronic obstructive pulmonary disease with acute lower respiratory infection: Secondary | ICD-10-CM

## 2012-08-22 MED ORDER — CEFUROXIME AXETIL 250 MG PO TABS: 250.0000 mg | ORAL_TABLET | Freq: Two times a day (BID) | ORAL | Status: DC

## 2012-08-22 MED ORDER — METHYLPREDNISOLONE ACETATE 80 MG/ML IJ SUSP
80.0000 mg | Freq: Once | INTRAMUSCULAR | Status: AC
  Administered 2012-08-22: 80 mg via INTRAMUSCULAR

## 2012-08-22 NOTE — Progress Notes (Signed)
Subjective:     Patient ID: Carlos Brooks, male   DOB: 1926-11-18, 79 y.o.   MRN: [id number]  HPI Pt with a long hx of COPD with intermit bronchitis Pt has noticed increase in SOB and prod cough This is also assoc with general malaise Denies fever/chills No N/V/D Pt tried inhaler that Dr Christell Constant gave but did not like SE  Review of Systems  All other systems reviewed and are negative.       Objective:   Physical Exam  Nursing note and vitals reviewed. Oral- no lesions No cerv nodes Heart- RRR w/o M Lungs- Coarse sounds bilat with exp wheeze     Assessment:     Bronchitis    Plan:     Cont with current inhalers and Alb neb prn Depomedrol 80 IM today Ceftin 250 bid x 10 days that has worked prev F/U prn Keep F/U regarding chronic issues

## 2012-08-22 NOTE — Patient Instructions (Signed)

## 2012-08-22 NOTE — Telephone Encounter (Signed)
Cough- pm appt given

## 2012-09-07 ENCOUNTER — Other Ambulatory Visit: Payer: Self-pay | Admitting: Family Medicine

## 2012-12-07 ENCOUNTER — Other Ambulatory Visit: Payer: Self-pay | Admitting: Family Medicine

## 2012-12-26 ENCOUNTER — Ambulatory Visit (INDEPENDENT_AMBULATORY_CARE_PROVIDER_SITE_OTHER): Payer: Medicare Other

## 2012-12-26 DIAGNOSIS — Z23 Encounter for immunization: Secondary | ICD-10-CM

## 2013-01-02 ENCOUNTER — Ambulatory Visit: Payer: Medicare Other | Admitting: Family Medicine

## 2013-01-09 ENCOUNTER — Encounter: Payer: Self-pay | Admitting: Family Medicine

## 2013-01-09 ENCOUNTER — Encounter (INDEPENDENT_AMBULATORY_CARE_PROVIDER_SITE_OTHER): Payer: Self-pay

## 2013-01-09 ENCOUNTER — Ambulatory Visit (INDEPENDENT_AMBULATORY_CARE_PROVIDER_SITE_OTHER): Payer: Medicare Other | Admitting: Family Medicine

## 2013-01-09 VITALS — BP 140/71 | HR 57 | Temp 97.2°F | Ht 66.0 in | Wt 145.0 lb

## 2013-01-09 DIAGNOSIS — J449 Chronic obstructive pulmonary disease, unspecified: Secondary | ICD-10-CM

## 2013-01-09 DIAGNOSIS — K219 Gastro-esophageal reflux disease without esophagitis: Secondary | ICD-10-CM

## 2013-01-09 DIAGNOSIS — R634 Abnormal weight loss: Secondary | ICD-10-CM

## 2013-01-09 DIAGNOSIS — Z87898 Personal history of other specified conditions: Secondary | ICD-10-CM

## 2013-01-09 DIAGNOSIS — E559 Vitamin D deficiency, unspecified: Secondary | ICD-10-CM

## 2013-01-09 DIAGNOSIS — E785 Hyperlipidemia, unspecified: Secondary | ICD-10-CM

## 2013-01-09 LAB — POCT CBC
HCT, POC: 43.8 % (ref 43.5–53.7)
Hemoglobin: 14.2 g/dL (ref 14.1–18.1)
MCH, POC: 28.8 pg (ref 27–31.2)
MCHC: 32.5 g/dL (ref 31.8–35.4)
MPV: 7.8 fL (ref 0–99.8)
POC LYMPH PERCENT: 30 %L (ref 10–50)
RBC: 4.9 M/uL (ref 4.69–6.13)

## 2013-01-09 NOTE — Progress Notes (Signed)
Subjective:    Patient ID: Carlos Brooks, male    DOB: 27-Jul-1926, 79 y.o.   MRN: [id number]  HPI Pt here for follow up and management of chronic medical problems.      Patient Active Problem List   Diagnosis Date Noted  . DYSPHAGIA UNSPECIFIED 06/20/2007  . ALLERGIC RHINITIS 04/28/2007  . LUNG NODULE 04/28/2007  . HYPERLIPIDEMIA 03/28/2007  . COPD 03/28/2007  . GERD 03/28/2007  . BENIGN PROSTATIC HYPERTROPHY, HX OF 03/28/2007   Outpatient Encounter Prescriptions as of 01/09/2013  Medication Sig  . albuterol (PROVENTIL) (2.5 MG/3ML) 0.083% nebulizer solution Take 2.5 mg by nebulization every 6 (six) hours as needed.    Marland Kitchen aspirin 81 MG EC tablet Take 81 mg by mouth daily.    . budesonide-formoterol (SYMBICORT) 160-4.5 MCG/ACT inhaler Inhale 2 puffs into the lungs 2 (two) times daily.  . calcium carbonate (OS-CAL) 600 MG TABS Take 600 mg by mouth 2 (two) times daily with a meal.    . Cholecalciferol (VITAMIN D3) 2000 UNITS TABS Take 1 tablet by mouth daily.    . finasteride (PROSCAR) 5 MG tablet TAKE 1 TABLET DAILY  . fluticasone (FLONASE) 50 MCG/ACT nasal spray Place 2 sprays into the nose daily.  Marland Kitchen guaiFENesin (MUCINEX) 600 MG 12 hr tablet Take 1,200 mg by mouth 2 (two) times daily.    Marland Kitchen levalbuterol (XOPENEX HFA) 45 MCG/ACT inhaler Inhale 1-2 puffs into the lungs every 4 (four) hours as needed for wheezing.  Marland Kitchen omeprazole (PRILOSEC) 40 MG capsule TAKE  (1)  CAPSULE  TWICE DAILY.  Marland Kitchen terazosin (HYTRIN) 5 MG capsule TAKE (1) CAPSULE DAILY  . [DISCONTINUED] cefUROXime (CEFTIN) 250 MG tablet Take 1 tablet (250 mg total) by mouth 2 (two) times daily.    Review of Systems  Constitutional: Positive for unexpected weight change (lost approx 9 lbs over the summer).  HENT: Negative.   Eyes: Negative.   Respiratory: Negative.   Cardiovascular: Negative.   Gastrointestinal: Negative.   Endocrine: Negative.   Genitourinary: Negative.   Musculoskeletal: Negative.   Skin: Negative.         Check old tick bite- right lower leg  Allergic/Immunologic: Negative.   Neurological: Negative.   Hematological: Negative.   Psychiatric/Behavioral: Negative.        Objective:   Physical Exam  Nursing note and vitals reviewed. Constitutional: He is oriented to person, place, and time. He appears well-developed and well-nourished. No distress.  For his age even with the documented 8 pound weight loss  HENT:  Head: Normocephalic and atraumatic.  Right Ear: External ear normal.  Left Ear: External ear normal.  Nose: Nose normal.  Mouth/Throat: Oropharynx is clear and moist. No oropharyngeal exudate.  Eyes: Conjunctivae and EOM are normal. Pupils are equal, round, and reactive to light. Right eye exhibits no discharge. Left eye exhibits no discharge. No scleral icterus.  Neck: Normal range of motion. Neck supple. No tracheal deviation present. No thyromegaly present.  Cardiovascular: Normal rate, regular rhythm, normal heart sounds and intact distal pulses.  Exam reveals no gallop and no friction rub.   No murmur heard. At 72 per minute  Pulmonary/Chest: Effort normal. No respiratory distress. He has wheezes (a few expiratory wheezes). He has no rales. He exhibits no tenderness.  Abdominal: Soft. Bowel sounds are normal. He exhibits no mass. There is no tenderness. There is no rebound and no guarding.  Genitourinary: Rectum normal and penis normal.  Prostate is enlarged without lumps or masses. No rectal masses.  No inguinal hernia  Musculoskeletal: Normal range of motion. He exhibits no edema and no tenderness.  Lymphadenopathy:    He has no cervical adenopathy.  Neurological: He is alert and oriented to person, place, and time. He has normal reflexes. No cranial nerve deficit.  Skin: Skin is warm and dry. No rash noted. He is not diaphoretic. No erythema. No pallor.  Psychiatric: He has a normal mood and affect. His behavior is normal. Judgment and thought content normal.   BP  140/71  Pulse 57  Temp(Src) 97.2 F (36.2 C) (Oral)  Ht 5\' 6"  (1.676 m)  Wt 145 lb (65.772 kg)  BMI 23.41 kg/m2        Assessment & Plan:   1. BENIGN PROSTATIC HYPERTROPHY, HX OF   2. COPD   3. HYPERLIPIDEMIA   4. GERD   5. Vitamin D deficiency   6. Loss of weight    Orders Placed This Encounter  Procedures  . Hepatic function panel  . BMP8+EGFR  . NMR, lipoprofile  . Vit D  25 hydroxy (rtn osteoporosis monitoring)  . POCT CBC   No orders of the defined types were placed in this encounter.   Patient Instructions  Drink plenty of fluids Take Mucinex maximum strength or the generic version one twice daily with a large glass of water Use inhalers regularly Always be careful and do not put yourself at risk for fall Return FOBT   Nyra Capes MD

## 2013-01-09 NOTE — Patient Instructions (Addendum)
Drink plenty of fluids Take Mucinex maximum strength or the generic version one twice daily with a large glass of water Use inhalers regularly Always be careful and do not put yourself at risk for fall Return FOBT Try cortisone 10 over-the-counter for the tick bite itching

## 2013-01-11 LAB — HEPATIC FUNCTION PANEL
ALT: 16 IU/L (ref 0–44)
Albumin: 4.6 g/dL (ref 3.5–4.7)
Alkaline Phosphatase: 41 IU/L (ref 39–117)
Bilirubin, Direct: 0.11 mg/dL (ref 0.00–0.40)
Total Bilirubin: 0.4 mg/dL (ref 0.0–1.2)

## 2013-01-11 LAB — BMP8+EGFR
BUN/Creatinine Ratio: 19 (ref 10–22)
Calcium: 10 mg/dL (ref 8.6–10.2)
Chloride: 100 mmol/L (ref 97–108)
Creatinine, Ser: 1.05 mg/dL (ref 0.76–1.27)
GFR calc Af Amer: 75 mL/min/{1.73_m2} (ref >59)
GFR calc non Af Amer: 65 mL/min/{1.73_m2} (ref >59)
Glucose: 98 mg/dL (ref 65–99)
Potassium: 5.1 mmol/L (ref 3.5–5.2)
Sodium: 141 mmol/L (ref 134–144)

## 2013-01-11 LAB — NMR, LIPOPROFILE
Cholesterol: 208 mg/dL — ABNORMAL HIGH (ref <200)
HDL Cholesterol by NMR: 52 mg/dL (ref >=40)
HDL Particle Number: 33 umol/L (ref >=30.5)
LDL Size: 20.8 nm (ref >20.5)
LP-IR Score: 43 (ref <=45)
Small LDL Particle Number: 777 nmol/L — ABNORMAL HIGH (ref <=527)
Triglycerides by NMR: 150 mg/dL — ABNORMAL HIGH (ref <150)

## 2013-01-18 ENCOUNTER — Other Ambulatory Visit (INDEPENDENT_AMBULATORY_CARE_PROVIDER_SITE_OTHER): Payer: Medicare Other

## 2013-01-18 DIAGNOSIS — Z1212 Encounter for screening for malignant neoplasm of rectum: Secondary | ICD-10-CM

## 2013-01-20 LAB — FECAL OCCULT BLOOD, IMMUNOCHEMICAL: Fecal Occult Bld: NEGATIVE

## 2013-01-24 ENCOUNTER — Encounter: Payer: Self-pay | Admitting: *Deleted

## 2013-01-24 NOTE — Progress Notes (Signed)
Quick Note:  Copy of labs sent to patient ______ 

## 2013-03-04 ENCOUNTER — Other Ambulatory Visit: Payer: Self-pay | Admitting: Family Medicine

## 2013-03-14 ENCOUNTER — Ambulatory Visit (INDEPENDENT_AMBULATORY_CARE_PROVIDER_SITE_OTHER): Payer: Medicare Other | Admitting: Nurse Practitioner

## 2013-03-14 ENCOUNTER — Telehealth: Payer: Self-pay | Admitting: Family Medicine

## 2013-03-14 ENCOUNTER — Encounter: Payer: Self-pay | Admitting: Nurse Practitioner

## 2013-03-14 VITALS — BP 142/83 | HR 70 | Temp 96.7°F | Ht 66.0 in | Wt 147.0 lb

## 2013-03-14 DIAGNOSIS — J209 Acute bronchitis, unspecified: Secondary | ICD-10-CM

## 2013-03-14 MED ORDER — METHYLPREDNISOLONE ACETATE 80 MG/ML IJ SUSP
80.0000 mg | Freq: Once | INTRAMUSCULAR | Status: AC
  Administered 2013-03-14: 80 mg via INTRAMUSCULAR

## 2013-03-14 MED ORDER — AMOXICILLIN 875 MG PO TABS: 875.0000 mg | ORAL_TABLET | Freq: Two times a day (BID) | ORAL | Status: DC

## 2013-03-14 NOTE — Progress Notes (Signed)
   Subjective:    Patient ID: Carlos Brooks, male    DOB: Jun 27, 1926, 80 y.o.   MRN: [id number]  HPI  Patient here today c/o nasal congestion and cough- started several days ago- loosing his voice in the evenings. SOB in the evenoings.    Review of Systems  Constitutional: Positive for fever (low grade). Negative for chills and appetite change.  HENT: Positive for congestion, postnasal drip, rhinorrhea and sinus pressure. Negative for ear pain, sore throat and trouble swallowing.   Respiratory: Positive for cough (aproductive at times).   Cardiovascular: Negative.   Gastrointestinal: Negative.   All other systems reviewed and are negative.       Objective:   Physical Exam  Constitutional: He appears well-developed and well-nourished.  HENT:  Right Ear: Hearing, tympanic membrane, external ear and ear canal normal.  Left Ear: Hearing, tympanic membrane, external ear and ear canal normal.  Nose: Mucosal edema and rhinorrhea present. Right sinus exhibits no maxillary sinus tenderness and no frontal sinus tenderness. Left sinus exhibits no maxillary sinus tenderness and no frontal sinus tenderness.  Mouth/Throat: Posterior oropharyngeal erythema (mild) present.  Eyes: EOM are normal. Pupils are equal, round, and reactive to light.  Neck: Normal range of motion. Neck supple.  Cardiovascular: Normal rate, regular rhythm and normal heart sounds.   Pulmonary/Chest: Effort normal. He has wheezes (slight exp wheezes). He has no rales.  Abdominal: Soft. Bowel sounds are normal.  Lymphadenopathy:    He has no cervical adenopathy.    BP 142/83  Pulse 70  Temp(Src) 96.7 F (35.9 C) (Oral)  Ht 5\' 6"  (1.676 m)  Wt 147 lb (66.679 kg)  BMI 23.74 kg/m2       Assessment & Plan:   1. Acute bronchitis    Meds ordered this encounter  Medications  . methylPREDNISolone acetate (DEPO-MEDROL) injection 80 mg    Sig:   . amoxicillin (AMOXIL) 875 MG tablet    Sig: Take 1 tablet (875 mg  total) by mouth 2 (two) times daily.    Dispense:  20 tablet    Refill:  0    Order Specific Question:  Supervising Provider    Answer:  Chipper Herb [[id number]]   1. Take meds as prescribed 2. Use a cool mist humidifier especially during the winter months and when heat has  been humid. 3. Use saline nose sprays frequently 4. Saline irrigations of the nose can be very helpful if done frequently.  * 4X daily for 1 week*  * Use of a nettie pot can be helpful with this. Follow directions with this* 5. Drink plenty of fluids 6. Keep thermostat turn down low 7.For any cough or congestion  Use plain Mucinex- regular strength or max strength is fine   * Children- consult with Pharmacist for dosing 8. For fever or aces or pains- take tylenol or ibuprofen appropriate for age and weight.  * for fevers greater than 101 orally you may alternate ibuprofen and tylenol every  3 hours.   Mary-Margaret Hassell Done, FNP

## 2013-03-14 NOTE — Telephone Encounter (Signed)
appt today at 12:20

## 2013-03-14 NOTE — Patient Instructions (Signed)

## 2013-05-02 ENCOUNTER — Ambulatory Visit: Payer: Medicare Other | Admitting: Family Medicine

## 2013-05-20 ENCOUNTER — Ambulatory Visit (INDEPENDENT_AMBULATORY_CARE_PROVIDER_SITE_OTHER): Payer: Medicare Other | Admitting: Nurse Practitioner

## 2013-05-20 ENCOUNTER — Encounter: Payer: Self-pay | Admitting: Nurse Practitioner

## 2013-05-20 VITALS — BP 135/69 | HR 84 | Temp 96.6°F | Ht 66.0 in | Wt 146.6 lb

## 2013-05-20 DIAGNOSIS — J069 Acute upper respiratory infection, unspecified: Secondary | ICD-10-CM

## 2013-05-20 MED ORDER — HYDROCODONE-HOMATROPINE 5-1.5 MG/5ML PO SYRP: 5.0000 mL | ORAL_SOLUTION | Freq: Three times a day (TID) | ORAL | Status: DC | PRN

## 2013-05-20 NOTE — Patient Instructions (Signed)

## 2013-05-20 NOTE — Progress Notes (Signed)
   Subjective:    Patient ID: Carlos Brooks, male    DOB: 1926-06-28, 80 y.o.   MRN: [id number]  HPI Patient presents today complaining of cough that started yesterday. Associated symptoms include congestion, patient felt fever, and chills. Treatment tried includes Amoxicillin.    Review of Systems  Constitutional: Positive for fever and chills.  HENT: Positive for congestion, rhinorrhea and sinus pressure.   Respiratory: Positive for cough. Negative for shortness of breath and wheezing.   Cardiovascular: Negative for chest pain.  All other systems reviewed and are negative.       Objective:   Physical Exam  Constitutional: He is oriented to person, place, and time. He appears well-developed and well-nourished.  HENT:  Right Ear: External ear normal.  Left Ear: External ear normal.  Mouth/Throat: Oropharynx is clear and moist.  Neck: Normal range of motion. Neck supple.  Cardiovascular: Normal rate, regular rhythm and normal heart sounds.   Pulmonary/Chest: Effort normal and breath sounds normal.  Lymphadenopathy:    He has no cervical adenopathy.  Neurological: He is alert and oriented to person, place, and time.  Skin: Skin is warm and dry.  Psychiatric: He has a normal mood and affect. His behavior is normal. Judgment and thought content normal.   BP 135/69  Pulse 84  Temp(Src) 96.6 F (35.9 C) (Oral)  Ht 5\' 6"  (1.676 m)  Wt 146 lb 9.6 oz (66.497 kg)  BMI 23.67 kg/m2        Assessment & Plan:   1. Acute upper respiratory infections of unspecified site    Meds ordered this encounter  Medications  . HYDROcodone-homatropine (HYCODAN) 5-1.5 MG/5ML syrup    Sig: Take 5 mLs by mouth every 8 (eight) hours as needed for cough.    Dispense:  120 mL    Refill:  0    Order Specific Question:  Supervising Provider    Answer:  Chipper Herb [[id number]]    1. Take meds as prescribed 2. Use a cool mist humidifier especially during the winter months and when heat has  been humid. 3. Use saline nose sprays frequently 4. Saline irrigations of the nose can be very helpful if done frequently.  * 4X daily for 1 week*  * Use of a nettie pot can be helpful with this. Follow directions with this* 5. Drink plenty of fluids 6. Keep thermostat turn down low 7.For any cough or congestion  Use plain Mucinex- regular strength or max strength is fine   * Children- consult with Pharmacist for dosing 8. For fever or aces or pains- take tylenol or ibuprofen appropriate for age and weight.  * for fevers greater than 101 orally you may alternate ibuprofen and tylenol every  3 hours.   Mary-Margaret Hassell Done, FNP

## 2013-06-06 ENCOUNTER — Other Ambulatory Visit: Payer: Self-pay | Admitting: Family Medicine

## 2013-07-13 ENCOUNTER — Ambulatory Visit (INDEPENDENT_AMBULATORY_CARE_PROVIDER_SITE_OTHER): Payer: Medicare Other | Admitting: Family Medicine

## 2013-07-13 ENCOUNTER — Encounter: Payer: Self-pay | Admitting: Family Medicine

## 2013-07-13 ENCOUNTER — Ambulatory Visit (INDEPENDENT_AMBULATORY_CARE_PROVIDER_SITE_OTHER): Payer: Medicare Other

## 2013-07-13 VITALS — BP 136/69 | HR 75 | Temp 97.1°F | Ht 66.0 in | Wt 143.0 lb

## 2013-07-13 DIAGNOSIS — G589 Mononeuropathy, unspecified: Secondary | ICD-10-CM

## 2013-07-13 DIAGNOSIS — G629 Polyneuropathy, unspecified: Secondary | ICD-10-CM

## 2013-07-13 DIAGNOSIS — E559 Vitamin D deficiency, unspecified: Secondary | ICD-10-CM

## 2013-07-13 DIAGNOSIS — E785 Hyperlipidemia, unspecified: Secondary | ICD-10-CM

## 2013-07-13 DIAGNOSIS — M542 Cervicalgia: Secondary | ICD-10-CM

## 2013-07-13 DIAGNOSIS — K219 Gastro-esophageal reflux disease without esophagitis: Secondary | ICD-10-CM

## 2013-07-13 DIAGNOSIS — J449 Chronic obstructive pulmonary disease, unspecified: Secondary | ICD-10-CM

## 2013-07-13 DIAGNOSIS — Z87898 Personal history of other specified conditions: Secondary | ICD-10-CM

## 2013-07-13 MED ORDER — ALBUTEROL SULFATE (2.5 MG/3ML) 0.083% IN NEBU: 2.5000 mg | INHALATION_SOLUTION | Freq: Four times a day (QID) | RESPIRATORY_TRACT | Status: DC | PRN

## 2013-07-13 NOTE — Progress Notes (Signed)
 Subjective:    Patient ID: Carlos Brooks, male    DOB: 09/18/1926, 80 y.o.   MRN: [id number]  HPI Pt here for follow up and management of chronic medical problems. The patient's review of systems is negative except for some neck pain. The neck pain started after some episodes of coughing. The neck pain radiates to both sides of his neck about the same distance appeared As of note the patient is statin intolerant. He does have hyperlipidemia COPD and gastroesophageal reflux disease. He has a past history otherwise nodule which on getting repeated CTs required no further followup. His last chest x-ray was about one year ago and showed some by apical and pleural scarring that was stable.        Patient Active Problem List   Diagnosis Date Noted  . DYSPHAGIA UNSPECIFIED 06/20/2007  . ALLERGIC RHINITIS 04/28/2007  . LUNG NODULE 04/28/2007  . HYPERLIPIDEMIA 03/28/2007  . COPD 03/28/2007  . GERD 03/28/2007  . BENIGN PROSTATIC HYPERTROPHY, HX OF 03/28/2007   Outpatient Encounter Prescriptions as of 07/13/2013  Medication Sig  . albuterol (PROVENTIL) (2.5 MG/3ML) 0.083% nebulizer solution Take 2.5 mg by nebulization every 6 (six) hours as needed.    . aspirin 81 MG EC tablet Take 81 mg by mouth daily.    . budesonide-formoterol (SYMBICORT) 160-4.5 MCG/ACT inhaler Inhale 2 puffs into the lungs 2 (two) times daily.  . calcium carbonate (OS-CAL) 600 MG TABS Take 600 mg by mouth 2 (two) times daily with a meal.    . Cholecalciferol (VITAMIN D3) 2000 UNITS TABS Take 1 tablet by mouth daily.    . finasteride (PROSCAR) 5 MG tablet TAKE 1 TABLET DAILY  . guaiFENesin (MUCINEX) 600 MG 12 hr tablet Take 1,200 mg by mouth 2 (two) times daily.    . omeprazole (PRILOSEC) 40 MG capsule TAKE  (1)  CAPSULE  TWICE DAILY.  . terazosin (HYTRIN) 5 MG capsule TAKE 1 CAPSULE BY MOUTH ONCE A DAY TAKE 1 CAPSULE BY MOUTH ONCE A DAY  . fluticasone (FLONASE) 50 MCG/ACT nasal spray Place 2 sprays into the nose  daily.  . [DISCONTINUED] amoxicillin (AMOXIL) 875 MG tablet Take 1 tablet (875 mg total) by mouth 2 (two) times daily.  . [DISCONTINUED] HYDROcodone-homatropine (HYCODAN) 5-1.5 MG/5ML syrup Take 5 mLs by mouth every 8 (eight) hours as needed for cough.  . [DISCONTINUED] levalbuterol (XOPENEX HFA) 45 MCG/ACT inhaler Inhale 1-2 puffs into the lungs every 4 (four) hours as needed for wheezing.    Review of Systems  Constitutional: Negative.   HENT: Negative.   Eyes: Negative.   Respiratory: Negative.   Cardiovascular: Negative.   Gastrointestinal: Negative.   Endocrine: Negative.   Genitourinary: Negative.   Musculoskeletal: Positive for neck pain.  Skin: Negative.   Allergic/Immunologic: Negative.   Neurological: Negative.   Hematological: Negative.   Psychiatric/Behavioral: Negative.        Objective:   Physical Exam  Nursing note and vitals reviewed. Constitutional: He is oriented to person, place, and time. He appears well-developed and well-nourished. No distress.  Pleasant and cooperative  HENT:  Head: Normocephalic and atraumatic.  Right Ear: External ear normal.  Left Ear: External ear normal.  Nose: Nose normal.  Mouth/Throat: Oropharynx is clear and moist. No oropharyngeal exudate.  Eyes: Conjunctivae and EOM are normal. Pupils are equal, round, and reactive to light. Right eye exhibits no discharge. Left eye exhibits no discharge. No scleral icterus.  Neck: Normal range of motion. Neck supple. No thyromegaly present.     Subjective:    Patient ID: Carlos Brooks, male    DOB: 09/18/1926, 80 y.o.   MRN: [id number]  HPI Pt here for follow up and management of chronic medical problems. The patient's review of systems is negative except for some neck pain. The neck pain started after some episodes of coughing. The neck pain radiates to both sides of his neck about the same distance appeared As of note the patient is statin intolerant. He does have hyperlipidemia COPD and gastroesophageal reflux disease. He has a past history otherwise nodule which on getting repeated CTs required no further followup. His last chest x-ray was about one year ago and showed some by apical and pleural scarring that was stable.        Patient Active Problem List   Diagnosis Date Noted  . DYSPHAGIA UNSPECIFIED 06/20/2007  . ALLERGIC RHINITIS 04/28/2007  . LUNG NODULE 04/28/2007  . HYPERLIPIDEMIA 03/28/2007  . COPD 03/28/2007  . GERD 03/28/2007  . BENIGN PROSTATIC HYPERTROPHY, HX OF 03/28/2007   Outpatient Encounter Prescriptions as of 07/13/2013  Medication Sig  . albuterol (PROVENTIL) (2.5 MG/3ML) 0.083% nebulizer solution Take 2.5 mg by nebulization every 6 (six) hours as needed.    . aspirin 81 MG EC tablet Take 81 mg by mouth daily.    . budesonide-formoterol (SYMBICORT) 160-4.5 MCG/ACT inhaler Inhale 2 puffs into the lungs 2 (two) times daily.  . calcium carbonate (OS-CAL) 600 MG TABS Take 600 mg by mouth 2 (two) times daily with a meal.    . Cholecalciferol (VITAMIN D3) 2000 UNITS TABS Take 1 tablet by mouth daily.    . finasteride (PROSCAR) 5 MG tablet TAKE 1 TABLET DAILY  . guaiFENesin (MUCINEX) 600 MG 12 hr tablet Take 1,200 mg by mouth 2 (two) times daily.    . omeprazole (PRILOSEC) 40 MG capsule TAKE  (1)  CAPSULE  TWICE DAILY.  . terazosin (HYTRIN) 5 MG capsule TAKE 1 CAPSULE BY MOUTH ONCE A DAY TAKE 1 CAPSULE BY MOUTH ONCE A DAY  . fluticasone (FLONASE) 50 MCG/ACT nasal spray Place 2 sprays into the nose  daily.  . [DISCONTINUED] amoxicillin (AMOXIL) 875 MG tablet Take 1 tablet (875 mg total) by mouth 2 (two) times daily.  . [DISCONTINUED] HYDROcodone-homatropine (HYCODAN) 5-1.5 MG/5ML syrup Take 5 mLs by mouth every 8 (eight) hours as needed for cough.  . [DISCONTINUED] levalbuterol (XOPENEX HFA) 45 MCG/ACT inhaler Inhale 1-2 puffs into the lungs every 4 (four) hours as needed for wheezing.    Review of Systems  Constitutional: Negative.   HENT: Negative.   Eyes: Negative.   Respiratory: Negative.   Cardiovascular: Negative.   Gastrointestinal: Negative.   Endocrine: Negative.   Genitourinary: Negative.   Musculoskeletal: Positive for neck pain.  Skin: Negative.   Allergic/Immunologic: Negative.   Neurological: Negative.   Hematological: Negative.   Psychiatric/Behavioral: Negative.        Objective:   Physical Exam  Nursing note and vitals reviewed. Constitutional: He is oriented to person, place, and time. He appears well-developed and well-nourished. No distress.  Pleasant and cooperative  HENT:  Head: Normocephalic and atraumatic.  Right Ear: External ear normal.  Left Ear: External ear normal.  Nose: Nose normal.  Mouth/Throat: Oropharynx is clear and moist. No oropharyngeal exudate.  Eyes: Conjunctivae and EOM are normal. Pupils are equal, round, and reactive to light. Right eye exhibits no discharge. Left eye exhibits no discharge. No scleral icterus.  Neck: Normal range of motion. Neck supple. No thyromegaly present.     Subjective:    Patient ID: Carlos Brooks, male    DOB: 09/18/1926, 80 y.o.   MRN: [id number]  HPI Pt here for follow up and management of chronic medical problems. The patient's review of systems is negative except for some neck pain. The neck pain started after some episodes of coughing. The neck pain radiates to both sides of his neck about the same distance appeared As of note the patient is statin intolerant. He does have hyperlipidemia COPD and gastroesophageal reflux disease. He has a past history otherwise nodule which on getting repeated CTs required no further followup. His last chest x-ray was about one year ago and showed some by apical and pleural scarring that was stable.        Patient Active Problem List   Diagnosis Date Noted  . DYSPHAGIA UNSPECIFIED 06/20/2007  . ALLERGIC RHINITIS 04/28/2007  . LUNG NODULE 04/28/2007  . HYPERLIPIDEMIA 03/28/2007  . COPD 03/28/2007  . GERD 03/28/2007  . BENIGN PROSTATIC HYPERTROPHY, HX OF 03/28/2007   Outpatient Encounter Prescriptions as of 07/13/2013  Medication Sig  . albuterol (PROVENTIL) (2.5 MG/3ML) 0.083% nebulizer solution Take 2.5 mg by nebulization every 6 (six) hours as needed.    . aspirin 81 MG EC tablet Take 81 mg by mouth daily.    . budesonide-formoterol (SYMBICORT) 160-4.5 MCG/ACT inhaler Inhale 2 puffs into the lungs 2 (two) times daily.  . calcium carbonate (OS-CAL) 600 MG TABS Take 600 mg by mouth 2 (two) times daily with a meal.    . Cholecalciferol (VITAMIN D3) 2000 UNITS TABS Take 1 tablet by mouth daily.    . finasteride (PROSCAR) 5 MG tablet TAKE 1 TABLET DAILY  . guaiFENesin (MUCINEX) 600 MG 12 hr tablet Take 1,200 mg by mouth 2 (two) times daily.    . omeprazole (PRILOSEC) 40 MG capsule TAKE  (1)  CAPSULE  TWICE DAILY.  . terazosin (HYTRIN) 5 MG capsule TAKE 1 CAPSULE BY MOUTH ONCE A DAY TAKE 1 CAPSULE BY MOUTH ONCE A DAY  . fluticasone (FLONASE) 50 MCG/ACT nasal spray Place 2 sprays into the nose  daily.  . [DISCONTINUED] amoxicillin (AMOXIL) 875 MG tablet Take 1 tablet (875 mg total) by mouth 2 (two) times daily.  . [DISCONTINUED] HYDROcodone-homatropine (HYCODAN) 5-1.5 MG/5ML syrup Take 5 mLs by mouth every 8 (eight) hours as needed for cough.  . [DISCONTINUED] levalbuterol (XOPENEX HFA) 45 MCG/ACT inhaler Inhale 1-2 puffs into the lungs every 4 (four) hours as needed for wheezing.    Review of Systems  Constitutional: Negative.   HENT: Negative.   Eyes: Negative.   Respiratory: Negative.   Cardiovascular: Negative.   Gastrointestinal: Negative.   Endocrine: Negative.   Genitourinary: Negative.   Musculoskeletal: Positive for neck pain.  Skin: Negative.   Allergic/Immunologic: Negative.   Neurological: Negative.   Hematological: Negative.   Psychiatric/Behavioral: Negative.        Objective:   Physical Exam  Nursing note and vitals reviewed. Constitutional: He is oriented to person, place, and time. He appears well-developed and well-nourished. No distress.  Pleasant and cooperative  HENT:  Head: Normocephalic and atraumatic.  Right Ear: External ear normal.  Left Ear: External ear normal.  Nose: Nose normal.  Mouth/Throat: Oropharynx is clear and moist. No oropharyngeal exudate.  Eyes: Conjunctivae and EOM are normal. Pupils are equal, round, and reactive to light. Right eye exhibits no discharge. Left eye exhibits no discharge. No scleral icterus.  Neck: Normal range of motion. Neck supple. No thyromegaly present.  

## 2013-07-13 NOTE — Patient Instructions (Addendum)
Medicare Annual Wellness Visit  [facility] and the medical providers at [facility] strive to bring you the best medical care.  In doing so we not only want to address your current medical conditions and concerns but also to detect new conditions early and prevent illness, disease and health-related problems.    Medicare offers a yearly Wellness Visit which allows our clinical staff to assess your need for preventative services including immunizations, lifestyle education, counseling to decrease risk of preventable diseases and screening for fall risk and other medical concerns.    This visit is provided free of charge (no copay) for all Medicare recipients. The clinical pharmacists at [facility] have begun to conduct these Wellness Visits which will also include a thorough review of all your medications.    As you primary medical provider recommend that you make an appointment for your Annual Wellness Visit if you have not done so already this year.  You may set up this appointment before you leave today or you may call back ([phone]) and schedule an appointment.  Please make sure when you call that you mention that you are scheduling your Annual Wellness Visit with the clinical pharmacist so that the appointment may be made for the proper length of time.      Continue current medications. Continue good therapeutic lifestyle changes which include good diet and exercise. Fall precautions discussed with patient. If an FOBT was given today- please return it to our front desk. If you are over 15 years old - you may need Prevnar 26 or the adult Pneumonia vaccine.  Do not do any climbing Use warm wet compresses on your neck 20 minutes 3 or 4 times daily We will call you with the results of the cervical spine films as soon as those results are available At that time we will consider using a short course of prednisone to see if it  might help the inflammation in your neck Avoid heavy lifting

## 2013-07-14 ENCOUNTER — Other Ambulatory Visit: Payer: Self-pay | Admitting: *Deleted

## 2013-07-14 MED ORDER — ALBUTEROL SULFATE (2.5 MG/3ML) 0.083% IN NEBU: 2.5000 mg | INHALATION_SOLUTION | Freq: Four times a day (QID) | RESPIRATORY_TRACT | Status: DC | PRN

## 2013-07-17 ENCOUNTER — Other Ambulatory Visit: Payer: Self-pay | Admitting: *Deleted

## 2013-07-17 DIAGNOSIS — M4802 Spinal stenosis, cervical region: Secondary | ICD-10-CM

## 2013-07-18 ENCOUNTER — Telehealth: Payer: Self-pay | Admitting: *Deleted

## 2013-07-18 ENCOUNTER — Other Ambulatory Visit (INDEPENDENT_AMBULATORY_CARE_PROVIDER_SITE_OTHER): Payer: Medicare Other

## 2013-07-18 DIAGNOSIS — K219 Gastro-esophageal reflux disease without esophagitis: Secondary | ICD-10-CM

## 2013-07-18 DIAGNOSIS — R5381 Other malaise: Secondary | ICD-10-CM

## 2013-07-18 DIAGNOSIS — J449 Chronic obstructive pulmonary disease, unspecified: Secondary | ICD-10-CM

## 2013-07-18 DIAGNOSIS — R5383 Other fatigue: Principal | ICD-10-CM

## 2013-07-18 NOTE — Telephone Encounter (Signed)
Patient is having some anxiety and difficulty sleeping due to neck problems and upcoming MRI. His wife takes Lorazepam and he was wanting to know if he could get a prescription for this as well.

## 2013-07-18 NOTE — Progress Notes (Signed)
Pt came in for labs from the [facility]th for dwm

## 2013-07-18 NOTE — Addendum Note (Signed)
Addended by: Pollyann Kennedy F on: 07/18/2013 10:33 AM   Modules accepted: Orders

## 2013-07-18 NOTE — Telephone Encounter (Signed)
Please call in lorazepam 0.5 one daily as needed #30 no refill

## 2013-07-19 LAB — BMP8+EGFR
BUN/Creatinine Ratio: 25 — ABNORMAL HIGH (ref 10–22)
BUN: 24 mg/dL (ref 8–27)
CO2: 26 mmol/L (ref 18–29)
Calcium: 9.6 mg/dL (ref 8.6–10.2)
Chloride: 100 mmol/L (ref 97–108)
Creatinine, Ser: 0.96 mg/dL (ref 0.76–1.27)
GFR calc Af Amer: 83 mL/min/{1.73_m2} (ref >59)
GFR calc non Af Amer: 72 mL/min/{1.73_m2} (ref >59)
Glucose: 94 mg/dL (ref 65–99)
Potassium: 4.1 mmol/L (ref 3.5–5.2)
Sodium: 140 mmol/L (ref 134–144)

## 2013-07-19 LAB — CBC WITH DIFFERENTIAL
BASOS ABS: 0 10*3/uL (ref 0.0–0.2)
BASOS: 0 %
Eos: 0 %
Eosinophils Absolute: 0 10*3/uL (ref 0.0–0.4)
HEMATOCRIT: 39.7 % (ref 37.5–51.0)
HEMOGLOBIN: 13.3 g/dL (ref 12.6–17.7)
Immature Grans (Abs): 0 10*3/uL (ref 0.0–0.1)
Immature Granulocytes: 0 %
LYMPHS ABS: 2.7 10*3/uL (ref 0.7–3.1)
LYMPHS: 18 %
MCH: 30.4 pg (ref 26.6–33.0)
MCHC: 33.5 g/dL (ref 31.5–35.7)
MCV: 91 fL (ref 79–97)
MONOCYTES: 9 %
Monocytes Absolute: 1.4 10*3/uL — ABNORMAL HIGH (ref 0.1–0.9)
NEUTROS ABS: 11.1 10*3/uL — AB (ref 1.4–7.0)
Neutrophils Relative %: 73 %
Platelets: 237 10*3/uL (ref 150–379)
RBC: 4.37 x10E6/uL (ref 4.14–5.80)
RDW: 14.3 % (ref 12.3–15.4)
WBC: 15.2 10*3/uL — AB (ref 3.4–10.8)

## 2013-07-19 LAB — NMR, LIPOPROFILE
Cholesterol: 209 mg/dL — ABNORMAL HIGH (ref 100–199)
HDL CHOLESTEROL BY NMR: 60 mg/dL (ref >39)
HDL PARTICLE NUMBER: 33.3 umol/L (ref >=30.5)
LDL Particle Number: 1407 nmol/L — ABNORMAL HIGH (ref <1000)
LDL Size: 21 nm (ref >20.5)
LDLC SERPL CALC-MCNC: 126 mg/dL — AB (ref 0–99)
LP-IR Score: 41 (ref <=45)
Small LDL Particle Number: 490 nmol/L (ref <=527)
Triglycerides by NMR: 116 mg/dL (ref 0–149)

## 2013-07-19 LAB — HEPATIC FUNCTION PANEL
ALBUMIN: 4.5 g/dL (ref 3.5–4.7)
ALT: 13 IU/L (ref 0–44)
AST: 18 IU/L (ref 0–40)
Alkaline Phosphatase: 44 IU/L (ref 39–117)
BILIRUBIN TOTAL: 0.4 mg/dL (ref 0.0–1.2)
Bilirubin, Direct: 0.11 mg/dL (ref 0.00–0.40)
Total Protein: 6.6 g/dL (ref 6.0–8.5)

## 2013-07-19 LAB — VITAMIN D 25 HYDROXY (VIT D DEFICIENCY, FRACTURES): Vit D, 25-Hydroxy: 27.2 ng/mL — ABNORMAL LOW (ref 30.0–100.0)

## 2013-07-21 MED ORDER — LORAZEPAM 0.5 MG PO TABS
0.5000 mg | ORAL_TABLET | Freq: Every day | ORAL | Status: DC
Start: 2013-07-21 — End: 2013-09-01

## 2013-07-21 NOTE — Telephone Encounter (Signed)
Phoned into [facility]

## 2013-07-26 ENCOUNTER — Other Ambulatory Visit: Payer: Self-pay | Admitting: Family Medicine

## 2013-07-26 DIAGNOSIS — Z139 Encounter for screening, unspecified: Secondary | ICD-10-CM

## 2013-07-28 ENCOUNTER — Telehealth: Payer: Self-pay | Admitting: Family Medicine

## 2013-08-01 ENCOUNTER — Ambulatory Visit
Admission: RE | Admit: 2013-08-01 | Discharge: 2013-08-01 | Disposition: A | Discharge status: Home / Self Care | Payer: Medicare Other | Source: Ambulatory Visit | Attending: Family Medicine | Admitting: Family Medicine

## 2013-08-01 ENCOUNTER — Other Ambulatory Visit (INDEPENDENT_AMBULATORY_CARE_PROVIDER_SITE_OTHER): Payer: Medicare Other

## 2013-08-01 DIAGNOSIS — Z139 Encounter for screening, unspecified: Secondary | ICD-10-CM

## 2013-08-01 DIAGNOSIS — M4802 Spinal stenosis, cervical region: Secondary | ICD-10-CM

## 2013-08-01 DIAGNOSIS — E785 Hyperlipidemia, unspecified: Secondary | ICD-10-CM

## 2013-08-01 DIAGNOSIS — R5381 Other malaise: Secondary | ICD-10-CM

## 2013-08-01 DIAGNOSIS — R5383 Other fatigue: Secondary | ICD-10-CM

## 2013-08-01 DIAGNOSIS — E559 Vitamin D deficiency, unspecified: Secondary | ICD-10-CM

## 2013-08-01 LAB — POCT CBC
GRANULOCYTE PERCENT: 67.6 % (ref 37–80)
HCT, POC: 43.2 % — AB (ref 43.5–53.7)
HEMOGLOBIN: 13.7 g/dL — AB (ref 14.1–18.1)
Lymph, poc: 2.3 (ref 0.6–3.4)
MCH, POC: 29.7 pg (ref 27–31.2)
MCHC: 31.7 g/dL — AB (ref 31.8–35.4)
MCV: 93.6 fL (ref 80–97)
MPV: 7.4 fL (ref 0–99.8)
POC GRANULOCYTE: 5.5 (ref 2–6.9)
POC LYMPH %: 28 % (ref 10–50)
Platelet Count, POC: 179 10*3/uL (ref 142–424)
RBC: 4.6 M/uL — AB (ref 4.69–6.13)
RDW, POC: 13.4 %
WBC: 8.2 10*3/uL (ref 4.6–10.2)

## 2013-08-01 NOTE — Progress Notes (Signed)
Patient came in to repeat cbc

## 2013-08-03 ENCOUNTER — Ambulatory Visit: Payer: Medicare Other | Admitting: Family Medicine

## 2013-08-03 ENCOUNTER — Other Ambulatory Visit: Payer: Self-pay | Admitting: *Deleted

## 2013-08-03 DIAGNOSIS — M47812 Spondylosis without myelopathy or radiculopathy, cervical region: Secondary | ICD-10-CM

## 2013-08-10 ENCOUNTER — Encounter (INDEPENDENT_AMBULATORY_CARE_PROVIDER_SITE_OTHER): Payer: Medicare Other | Admitting: Ophthalmology

## 2013-09-01 ENCOUNTER — Other Ambulatory Visit: Payer: Self-pay | Admitting: Family Medicine

## 2013-09-04 ENCOUNTER — Other Ambulatory Visit: Payer: Self-pay | Admitting: Family Medicine

## 2013-09-04 NOTE — Telephone Encounter (Signed)
Patient last seen in office on 07-13-13. Rx last filled on 07-25-13 for #30. Please advise. If approved please route to pool A so nurse can phone in to pharmacy

## 2013-09-05 ENCOUNTER — Ambulatory Visit: Payer: Medicare Other | Attending: Physical Medicine and Rehabilitation | Admitting: Physical Therapy

## 2013-09-05 DIAGNOSIS — R293 Abnormal posture: Secondary | ICD-10-CM | POA: Insufficient documentation

## 2013-09-05 DIAGNOSIS — M5030 Other cervical disc degeneration, unspecified cervical region: Secondary | ICD-10-CM | POA: Insufficient documentation

## 2013-09-05 DIAGNOSIS — IMO0001 Reserved for inherently not codable concepts without codable children: Secondary | ICD-10-CM | POA: Diagnosis not present

## 2013-09-05 DIAGNOSIS — R5381 Other malaise: Secondary | ICD-10-CM | POA: Insufficient documentation

## 2013-09-05 NOTE — Telephone Encounter (Signed)
Last seen 07/13/13  DWM  If approved route to nurse to call into [facility]

## 2013-09-06 ENCOUNTER — Encounter (INDEPENDENT_AMBULATORY_CARE_PROVIDER_SITE_OTHER): Payer: Medicare Other | Admitting: Ophthalmology

## 2013-09-07 ENCOUNTER — Encounter (INDEPENDENT_AMBULATORY_CARE_PROVIDER_SITE_OTHER): Payer: Medicare Other | Admitting: Ophthalmology

## 2013-09-07 ENCOUNTER — Other Ambulatory Visit: Payer: Self-pay | Admitting: Family Medicine

## 2013-09-07 DIAGNOSIS — I10 Essential (primary) hypertension: Secondary | ICD-10-CM

## 2013-09-07 DIAGNOSIS — H43819 Vitreous degeneration, unspecified eye: Secondary | ICD-10-CM

## 2013-09-07 DIAGNOSIS — H35039 Hypertensive retinopathy, unspecified eye: Secondary | ICD-10-CM

## 2013-09-07 DIAGNOSIS — H26499 Other secondary cataract, unspecified eye: Secondary | ICD-10-CM

## 2013-09-11 NOTE — Telephone Encounter (Signed)
Called to [facility] 

## 2013-09-11 NOTE — Telephone Encounter (Signed)
Last seen 07/13/13 DWM  If approved route to nurse to call into [facility]

## 2013-09-11 NOTE — Telephone Encounter (Signed)
Last seen 07/13/13  DWM  If approved route to nurse to call into [facility]

## 2013-09-11 NOTE — Telephone Encounter (Signed)
This is okay to be refilled for 6 months.

## 2013-09-12 ENCOUNTER — Ambulatory Visit: Payer: Medicare Other | Attending: Physical Medicine and Rehabilitation | Admitting: *Deleted

## 2013-09-12 DIAGNOSIS — R293 Abnormal posture: Secondary | ICD-10-CM | POA: Insufficient documentation

## 2013-09-12 DIAGNOSIS — IMO0001 Reserved for inherently not codable concepts without codable children: Secondary | ICD-10-CM | POA: Insufficient documentation

## 2013-09-12 DIAGNOSIS — R5381 Other malaise: Secondary | ICD-10-CM | POA: Insufficient documentation

## 2013-09-12 DIAGNOSIS — M5030 Other cervical disc degeneration, unspecified cervical region: Secondary | ICD-10-CM | POA: Insufficient documentation

## 2013-09-18 ENCOUNTER — Ambulatory Visit: Payer: Medicare Other | Admitting: Physical Therapy

## 2013-09-18 DIAGNOSIS — IMO0001 Reserved for inherently not codable concepts without codable children: Secondary | ICD-10-CM | POA: Diagnosis not present

## 2013-10-02 ENCOUNTER — Other Ambulatory Visit: Payer: Self-pay | Admitting: Family Medicine

## 2013-10-13 ENCOUNTER — Encounter (INDEPENDENT_AMBULATORY_CARE_PROVIDER_SITE_OTHER): Payer: Medicare Other | Admitting: Ophthalmology

## 2013-10-13 DIAGNOSIS — H2700 Aphakia, unspecified eye: Secondary | ICD-10-CM

## 2013-11-03 ENCOUNTER — Other Ambulatory Visit: Payer: Self-pay

## 2013-11-03 MED ORDER — FINASTERIDE 5 MG PO TABS: ORAL_TABLET | ORAL | Status: DC

## 2013-11-03 MED ORDER — OMEPRAZOLE 40 MG PO CPDR: DELAYED_RELEASE_CAPSULE | ORAL | Status: DC

## 2013-11-08 ENCOUNTER — Other Ambulatory Visit: Payer: Self-pay | Admitting: Family Medicine

## 2013-11-24 ENCOUNTER — Ambulatory Visit: Payer: Medicare Other | Admitting: Family Medicine

## 2013-11-28 ENCOUNTER — Other Ambulatory Visit: Payer: Self-pay | Admitting: Family Medicine

## 2013-12-05 ENCOUNTER — Encounter: Payer: Self-pay | Admitting: Family Medicine

## 2013-12-05 ENCOUNTER — Ambulatory Visit (INDEPENDENT_AMBULATORY_CARE_PROVIDER_SITE_OTHER): Payer: Medicare Other | Admitting: Family Medicine

## 2013-12-05 VITALS — BP 121/67 | HR 64 | Temp 97.0°F | Ht 66.0 in | Wt 142.0 lb

## 2013-12-05 DIAGNOSIS — R300 Dysuria: Secondary | ICD-10-CM

## 2013-12-05 DIAGNOSIS — J449 Chronic obstructive pulmonary disease, unspecified: Secondary | ICD-10-CM

## 2013-12-05 DIAGNOSIS — K219 Gastro-esophageal reflux disease without esophagitis: Secondary | ICD-10-CM

## 2013-12-05 DIAGNOSIS — M545 Low back pain, unspecified: Secondary | ICD-10-CM

## 2013-12-05 DIAGNOSIS — Z87898 Personal history of other specified conditions: Secondary | ICD-10-CM

## 2013-12-05 DIAGNOSIS — E559 Vitamin D deficiency, unspecified: Secondary | ICD-10-CM

## 2013-12-05 DIAGNOSIS — E785 Hyperlipidemia, unspecified: Secondary | ICD-10-CM

## 2013-12-05 DIAGNOSIS — Z23 Encounter for immunization: Secondary | ICD-10-CM

## 2013-12-05 LAB — POCT UA - MICROSCOPIC ONLY
BACTERIA, U MICROSCOPIC: NEGATIVE
CRYSTALS, UR, HPF, POC: NEGATIVE
Casts, Ur, LPF, POC: NEGATIVE
Mucus, UA: NEGATIVE
Yeast, UA: NEGATIVE

## 2013-12-05 LAB — POCT CBC
Granulocyte percent: 69.4 %G (ref 37–80)
HCT, POC: 42.2 % — AB (ref 43.5–53.7)
HEMOGLOBIN: 14 g/dL — AB (ref 14.1–18.1)
LYMPH, POC: 1.6 (ref 0.6–3.4)
MCH: 30.4 pg (ref 27–31.2)
MCHC: 33.1 g/dL (ref 31.8–35.4)
MCV: 91.8 fL (ref 80–97)
MPV: 7.2 fL (ref 0–99.8)
POC Granulocyte: 5.1 (ref 2–6.9)
POC LYMPH %: 22.2 % (ref 10–50)
Platelet Count, POC: 180 10*3/uL (ref 142–424)
RBC: 4.63 M/uL — AB (ref 4.69–6.13)
RDW, POC: 14.5 %
WBC: 7.3 10*3/uL (ref 4.6–10.2)

## 2013-12-05 LAB — POCT URINALYSIS DIPSTICK
Bilirubin, UA: NEGATIVE
GLUCOSE UA: NEGATIVE
Ketones, UA: NEGATIVE
Nitrite, UA: NEGATIVE
Protein, UA: NEGATIVE
Urobilinogen, UA: NEGATIVE
pH, UA: 5

## 2013-12-05 MED ORDER — SULFAMETHOXAZOLE-TMP DS 800-160 MG PO TABS: 1.0000 | ORAL_TABLET | Freq: Two times a day (BID) | ORAL | Status: DC

## 2013-12-05 NOTE — Patient Instructions (Addendum)
Medicare Annual Wellness Visit  [facility] and the medical providers at [facility] strive to bring you the best medical care.  In doing so we not only want to address your current medical conditions and concerns but also to detect new conditions early and prevent illness, disease and health-related problems.    Medicare offers a yearly Wellness Visit which allows our clinical staff to assess your need for preventative services including immunizations, lifestyle education, counseling to decrease risk of preventable diseases and screening for fall risk and other medical concerns.    This visit is provided free of charge (no copay) for all Medicare recipients. The clinical pharmacists at [facility] have begun to conduct these Wellness Visits which will also include a thorough review of all your medications.    As you primary medical provider recommend that you make an appointment for your Annual Wellness Visit if you have not done so already this year.  You may set up this appointment before you leave today or you may call back ([phone]) and schedule an appointment.  Please make sure when you call that you mention that you are scheduling your Annual Wellness Visit with the clinical pharmacist so that the appointment may be made for the proper length of time.       Continue current medications. Continue good therapeutic lifestyle changes which include good diet and exercise. Fall precautions discussed with patient. If an FOBT was given today- please return it to our front desk. If you are over 76 years old - you may need Prevnar 30 or the adult Pneumonia vaccine.  Flu Shots will be available at our office starting mid- September. Please call and schedule a FLU CLINIC APPOINTMENT.   Drink plenty of fluids, continue his regular medication Try to use your inhalers more regularly including the nasal inhaler

## 2013-12-05 NOTE — Addendum Note (Signed)
Addended by: Zannie Cove on: 12/05/2013 04:17 PM   Modules accepted: Orders

## 2013-12-05 NOTE — Progress Notes (Signed)
Subjective:    Patient ID: Carlos Brooks, male    DOB: May 26, 1926, 80 y.o.   MRN: [id number]  HPI Pt here for follow up and management of chronic medical problems. The patient complains of neck and low back pain. He is seeing the orthopedic surgeon and getting injections for the neck pain. He is somewhat better with this. He is also having some frequency and dysuria. He sees the pulmonologist regularly. He is due to get his flu shot today.         Patient Active Problem List   Diagnosis Date Noted  . DYSPHAGIA UNSPECIFIED 06/20/2007  . ALLERGIC RHINITIS 04/28/2007  . LUNG NODULE 04/28/2007  . HYPERLIPIDEMIA 03/28/2007  . COPD 03/28/2007  . GERD 03/28/2007  . BENIGN PROSTATIC HYPERTROPHY, HX OF 03/28/2007   Outpatient Encounter Prescriptions as of 12/05/2013  Medication Sig  . albuterol (PROVENTIL) (2.5 MG/3ML) 0.083% nebulizer solution Take 3 mLs (2.5 mg total) by nebulization every 6 (six) hours as needed. Dx: 496  . aspirin 81 MG EC tablet Take 81 mg by mouth daily.    . calcium carbonate (OS-CAL) 600 MG TABS Take 600 mg by mouth 2 (two) times daily with a meal.    . Cholecalciferol (VITAMIN D3) 2000 UNITS TABS Take 1 tablet by mouth daily.    . finasteride (PROSCAR) 5 MG tablet TAKE 1 TABLET DAILY  . guaiFENesin (MUCINEX) 600 MG 12 hr tablet Take 1,200 mg by mouth 2 (two) times daily.    Marland Kitchen LORazepam (ATIVAN) 0.5 MG tablet TAKE 1 TABLET BY MOUTH EVERY DAY AS NEEDED  . omeprazole (PRILOSEC) 40 MG capsule TAKE  (1)  CAPSULE  TWICE DAILY.  Marland Kitchen terazosin (HYTRIN) 5 MG capsule TAKE ONE CAPSULE EVERY DAY  . budesonide-formoterol (SYMBICORT) 160-4.5 MCG/ACT inhaler Inhale 2 puffs into the lungs 2 (two) times daily.  . fluticasone (FLONASE) 50 MCG/ACT nasal spray Place 2 sprays into the nose daily.    Review of Systems  Constitutional: Negative.   HENT: Negative.   Eyes: Negative.   Respiratory: Negative.   Cardiovascular: Negative.   Gastrointestinal: Negative.   Endocrine:  Negative.   Genitourinary: Positive for dysuria and frequency.  Musculoskeletal: Positive for back pain (low back) and neck pain. Neck stiffness: going to Dr. Nelva Bush for this.  Skin: Negative.   Allergic/Immunologic: Negative.   Neurological: Negative.   Hematological: Negative.   Psychiatric/Behavioral: Negative.        Objective:   Physical Exam  Nursing note and vitals reviewed. Constitutional: He is oriented to person, place, and time. He appears well-developed and well-nourished. No distress.  Patient is doing well and continues to be followed by the orthopedic surgeon. He has had a slower stream and some discomfort with voiding  HENT:  Head: Normocephalic and atraumatic.  Right Ear: External ear normal.  Left Ear: External ear normal.  Nose: Nose normal.  Mouth/Throat: Oropharynx is clear and moist. No oropharyngeal exudate.  Nasal congestion left greater than  Eyes: Conjunctivae and EOM are normal. Pupils are equal, round, and reactive to light. Right eye exhibits no discharge. Left eye exhibits no discharge. No scleral icterus.  Neck: Normal range of motion. Neck supple. No tracheal deviation present. No thyromegaly present.  No carotid bruits  Cardiovascular: Normal rate, regular rhythm, normal heart sounds and intact distal pulses.  Exam reveals no gallop and no friction rub.   No murmur heard. At 72 per minute  Pulmonary/Chest: Effort normal. No respiratory distress. He has wheezes. He has  no rales. He exhibits no tenderness.  A few expiratory wheezes. No axillary adenopathy. Patient has a tight cough. He will not use his inhalers was discussed with him   Abdominal: Soft. Bowel sounds are normal. He exhibits no mass. There is no tenderness. There is no rebound and no guarding.  No suprapubic or epigastric tenderness  Genitourinary: Rectum normal and prostate normal.  The prostate was enlarged soft and tender to palpation. The external genitalia were not examined at the  visit today to  Musculoskeletal: Normal range of motion. He exhibits no edema and no tenderness.  Lymphadenopathy:    He has no cervical adenopathy.  Neurological: He is alert and oriented to person, place, and time. He has normal reflexes. No cranial nerve deficit.  Skin: Skin is warm and dry. No rash noted. No pallor.  Psychiatric: He has a normal mood and affect. His behavior is normal. Judgment and thought content normal.   BP 121/67  Pulse 64  Temp(Src) 97 F (36.1 C) (Oral)  Ht _0  (1.676 m)  Wt 142 lb (64.411 kg)  BMI 22.93 kg/m2        Assessment & Plan:  1. BENIGN PROSTATIC HYPERTROPHY, HX OF - POCT CBC  2. COPD - POCT CBC  3. Gastroesophageal reflux disease, esophagitis presence not specified - POCT CBC  4. HYPERLIPIDEMIA - POCT CBC - BMP8+EGFR - Hepatic function panel - NMR, lipoprofile  5. Dysuria -Urinalysis pending - POCT urinalysis dipstick - POCT UA - Microscopic Only - Urine culture - POCT CBC  6. Low back pain, unspecified back pain laterality, with sciatica presence unspecified - POCT urinalysis dipstick - POCT UA - Microscopic Only - Urine culture - POCT CBC  7. Vitamin D deficiency - Vit D  25 hydroxy (rtn osteoporosis monitoring)  Patient Instructions                       Medicare Annual Wellness Visit  [facility] and the medical providers at [facility] strive to bring you the best medical care.  In doing so we not only want to address your current medical conditions and concerns but also to detect new conditions early and prevent illness, disease and health-related problems.    Medicare offers a yearly Wellness Visit which allows our clinical staff to assess your need for preventative services including immunizations, lifestyle education, counseling to decrease risk of preventable diseases and screening for fall risk and other medical concerns.    This visit is provided free of charge (no copay) for all  Medicare recipients. The clinical pharmacists at [facility] have begun to conduct these Wellness Visits which will also include a thorough review of all your medications.    As you primary medical provider recommend that you make an appointment for your Annual Wellness Visit if you have not done so already this year.  You may set up this appointment before you leave today or you may call back ([phone]) and schedule an appointment.  Please make sure when you call that you mention that you are scheduling your Annual Wellness Visit with the clinical pharmacist so that the appointment may be made for the proper length of time.       Continue current medications. Continue good therapeutic lifestyle changes which include good diet and exercise. Fall precautions discussed with patient. If an FOBT was given today- please return it to our front desk. If you are over 39 years old -  you may need Prevnar 13 or the adult Pneumonia vaccine.  Flu Shots will be available at our office starting mid- September. Please call and schedule a FLU CLINIC APPOINTMENT.   Drink plenty of fluids, continue his regular medication Try to use your inhalers more regularly including the nasal inhaler   Arrie Senate MD

## 2013-12-06 LAB — NMR, LIPOPROFILE
Cholesterol: 230 mg/dL — ABNORMAL HIGH (ref 100–199)
HDL Cholesterol by NMR: 46 mg/dL (ref >39)
HDL Particle Number: 32.7 umol/L (ref >=30.5)
LDL Particle Number: 1833 nmol/L — ABNORMAL HIGH (ref <1000)
LDL SIZE: 20.2 nm (ref >20.5)
LDLC SERPL CALC-MCNC: 154 mg/dL — ABNORMAL HIGH (ref 0–99)
LP-IR Score: 51 — ABNORMAL HIGH (ref <=45)
Small LDL Particle Number: 1152 nmol/L — ABNORMAL HIGH (ref <=527)
Triglycerides by NMR: 150 mg/dL — ABNORMAL HIGH (ref 0–149)

## 2013-12-06 LAB — BMP8+EGFR
BUN/Creatinine Ratio: 17 (ref 10–22)
BUN: 18 mg/dL (ref 8–27)
CO2: 26 mmol/L (ref 18–29)
Calcium: 9.7 mg/dL (ref 8.6–10.2)
Chloride: 98 mmol/L (ref 97–108)
Creatinine, Ser: 1.09 mg/dL (ref 0.76–1.27)
GFR calc Af Amer: 71 mL/min/{1.73_m2} (ref >59)
GFR, EST NON AFRICAN AMERICAN: 62 mL/min/{1.73_m2} (ref >59)
Glucose: 97 mg/dL (ref 65–99)
Potassium: 4.1 mmol/L (ref 3.5–5.2)
SODIUM: 140 mmol/L (ref 134–144)

## 2013-12-06 LAB — HEPATIC FUNCTION PANEL
ALT: 12 IU/L (ref 0–44)
AST: 16 IU/L (ref 0–40)
Albumin: 4.7 g/dL (ref 3.5–4.7)
Alkaline Phosphatase: 53 IU/L (ref 39–117)
BILIRUBIN DIRECT: 0.1 mg/dL (ref 0.00–0.40)
Total Bilirubin: 0.5 mg/dL (ref 0.0–1.2)
Total Protein: 6.9 g/dL (ref 6.0–8.5)

## 2013-12-06 LAB — VITAMIN D 25 HYDROXY (VIT D DEFICIENCY, FRACTURES): Vit D, 25-Hydroxy: 31.3 ng/mL (ref 30.0–100.0)

## 2014-01-30 ENCOUNTER — Encounter: Payer: Self-pay | Admitting: Nurse Practitioner

## 2014-01-30 ENCOUNTER — Ambulatory Visit (INDEPENDENT_AMBULATORY_CARE_PROVIDER_SITE_OTHER): Payer: Medicare Other | Admitting: Nurse Practitioner

## 2014-01-30 VITALS — BP 156/79 | HR 88 | Temp 97.2°F | Ht 66.0 in | Wt 140.6 lb

## 2014-01-30 DIAGNOSIS — R059 Cough, unspecified: Secondary | ICD-10-CM

## 2014-01-30 DIAGNOSIS — J029 Acute pharyngitis, unspecified: Secondary | ICD-10-CM

## 2014-01-30 DIAGNOSIS — R05 Cough: Secondary | ICD-10-CM

## 2014-01-30 DIAGNOSIS — R52 Pain, unspecified: Secondary | ICD-10-CM

## 2014-01-30 DIAGNOSIS — J209 Acute bronchitis, unspecified: Secondary | ICD-10-CM

## 2014-01-30 LAB — POCT RAPID STREP A (OFFICE): Rapid Strep A Screen: NEGATIVE

## 2014-01-30 LAB — POCT INFLUENZA A/B
INFLUENZA A, POC: NEGATIVE
INFLUENZA B, POC: NEGATIVE

## 2014-01-30 MED ORDER — AMOXICILLIN 875 MG PO TABS
875.0000 mg | ORAL_TABLET | Freq: Two times a day (BID) | ORAL | Status: DC
Start: 2014-01-30 — End: 2014-04-17

## 2014-01-30 MED ORDER — BENZONATATE 100 MG PO CAPS: 100.0000 mg | ORAL_CAPSULE | Freq: Two times a day (BID) | ORAL | Status: DC | PRN

## 2014-01-30 NOTE — Patient Instructions (Signed)

## 2014-01-30 NOTE — Progress Notes (Signed)
Subjective:    Patient ID: Carlos Brooks, male    DOB: February 21, 1927, 80 y.o.   MRN: [id number]  HPI Patient in today c/o cough and congestion- patient says that it always gets down in i=his chest. No fever    Review of Systems  Constitutional: Negative for fever and chills.  HENT: Positive for congestion and rhinorrhea.   Respiratory: Positive for cough.   Cardiovascular: Negative.   Gastrointestinal: Negative.   Genitourinary: Negative.   Neurological: Negative.   Psychiatric/Behavioral: Negative.   All other systems reviewed and are negative.      Objective:   Physical Exam  Constitutional: He is oriented to person, place, and time. He appears well-developed and well-nourished.  HENT:  Right Ear: Hearing, tympanic membrane, external ear and ear canal normal.  Left Ear: Hearing, tympanic membrane, external ear and ear canal normal.  Nose: Mucosal edema and rhinorrhea present. Right sinus exhibits no maxillary sinus tenderness and no frontal sinus tenderness. Left sinus exhibits no maxillary sinus tenderness and no frontal sinus tenderness.  Mouth/Throat: Uvula is midline, oropharynx is clear and moist and mucous membranes are normal.  Eyes: Pupils are equal, round, and reactive to light.  Neck: Normal range of motion. Neck supple.  Cardiovascular: Normal rate, regular rhythm and normal heart sounds.   Pulmonary/Chest: Effort normal and breath sounds normal.  Deep dry cough  Abdominal: Soft.  Lymphadenopathy:    He has no cervical adenopathy.  Neurological: He is alert and oriented to person, place, and time.  Skin: Skin is warm and dry.  Psychiatric: He has a normal mood and affect. His behavior is normal. Judgment and thought content normal.    BP 156/79 mmHg  Pulse 88  Temp(Src) 97.2 F (36.2 C) (Oral)  Ht 5\' 6"  (1.676 m)  Wt 140 lb 9.6 oz (63.776 kg)  BMI 22.70 kg/m2       Assessment & Plan:   1. Sore throat   2. Cough   3. Body aches   4. Acute  bronchitis, unspecified organism    Meds ordered this encounter  Medications  . amoxicillin (AMOXIL) 875 MG tablet    Sig: Take 1 tablet (875 mg total) by mouth 2 (two) times daily.    Dispense:  20 tablet    Refill:  0    Order Specific Question:  Supervising Provider    Answer:  Chipper Herb [[id number]]  . benzonatate (TESSALON) 100 MG capsule    Sig: Take 1 capsule (100 mg total) by mouth 2 (two) times daily as needed for cough.    Dispense:  20 capsule    Refill:  0    Order Specific Question:  Supervising Provider    Answer:  Chipper Herb [[id number]]   1. Take meds as prescribed 2. Use a cool mist humidifier especially during the winter months and when heat has been humid. 3. Use saline nose sprays frequently 4. Saline irrigations of the nose can be very helpful if done frequently.  * 4X daily for 1 week*  * Use of a nettie pot can be helpful with this. Follow directions with this* 5. Drink plenty of fluids 6. Keep thermostat turn down low 7.For any cough or congestion  Use plain Mucinex- regular strength or max strength is fine   * Children- consult with Pharmacist for dosing 8. For fever or aces or pains- take tylenol or ibuprofen appropriate for age and weight.  * for fevers greater than 101 orally you may  alternate ibuprofen and tylenol every  3 hours.   Mary-Margaret Hassell Done, FNP

## 2014-02-05 ENCOUNTER — Other Ambulatory Visit: Payer: Self-pay | Admitting: Family Medicine

## 2014-03-05 ENCOUNTER — Other Ambulatory Visit: Payer: Self-pay | Admitting: Family Medicine

## 2014-03-06 NOTE — Telephone Encounter (Signed)
Last seen 12/05/13, last filled 02/05/14. Call into [facility]

## 2014-04-17 ENCOUNTER — Ambulatory Visit (INDEPENDENT_AMBULATORY_CARE_PROVIDER_SITE_OTHER): Payer: PPO

## 2014-04-17 ENCOUNTER — Ambulatory Visit (INDEPENDENT_AMBULATORY_CARE_PROVIDER_SITE_OTHER): Payer: PPO | Admitting: Family Medicine

## 2014-04-17 ENCOUNTER — Encounter: Payer: Self-pay | Admitting: Family Medicine

## 2014-04-17 VITALS — BP 142/81 | HR 80 | Temp 99.0°F | Ht 66.0 in | Wt 141.0 lb

## 2014-04-17 DIAGNOSIS — E785 Hyperlipidemia, unspecified: Secondary | ICD-10-CM

## 2014-04-17 DIAGNOSIS — R05 Cough: Secondary | ICD-10-CM

## 2014-04-17 DIAGNOSIS — E559 Vitamin D deficiency, unspecified: Secondary | ICD-10-CM

## 2014-04-17 DIAGNOSIS — I700 Atherosclerosis of aorta: Secondary | ICD-10-CM

## 2014-04-17 DIAGNOSIS — J209 Acute bronchitis, unspecified: Secondary | ICD-10-CM

## 2014-04-17 DIAGNOSIS — J441 Chronic obstructive pulmonary disease with (acute) exacerbation: Secondary | ICD-10-CM

## 2014-04-17 DIAGNOSIS — R059 Cough, unspecified: Secondary | ICD-10-CM

## 2014-04-17 DIAGNOSIS — K219 Gastro-esophageal reflux disease without esophagitis: Secondary | ICD-10-CM

## 2014-04-17 LAB — POCT CBC
GRANULOCYTE PERCENT: 78.8 % (ref 37–80)
HCT, POC: 43.8 % (ref 43.5–53.7)
Hemoglobin: 13.6 g/dL — AB (ref 14.1–18.1)
LYMPH, POC: 2.4 (ref 0.6–3.4)
MCH, POC: 28.5 pg (ref 27–31.2)
MCHC: 31.1 g/dL — AB (ref 31.8–35.4)
MCV: 91.5 fL (ref 80–97)
MPV: 7.8 fL (ref 0–99.8)
PLATELET COUNT, POC: 235 10*3/uL (ref 142–424)
POC GRANULOCYTE: 11.6 — AB (ref 2–6.9)
POC LYMPH PERCENT: 16.6 %L (ref 10–50)
RBC: 4.8 M/uL (ref 4.69–6.13)
RDW, POC: 14.2 %
WBC: 14.7 10*3/uL — AB (ref 4.6–10.2)

## 2014-04-17 MED ORDER — LEVOFLOXACIN 500 MG PO TABS: 500.0000 mg | ORAL_TABLET | Freq: Every day | ORAL | Status: DC

## 2014-04-17 MED ORDER — METHYLPREDNISOLONE ACETATE 80 MG/ML IJ SUSP
60.0000 mg | Freq: Once | INTRAMUSCULAR | Status: AC
  Administered 2014-04-17: 60 mg via INTRAMUSCULAR

## 2014-04-17 MED ORDER — PREDNISONE 10 MG PO TABS: ORAL_TABLET | ORAL | Status: DC

## 2014-04-17 NOTE — Progress Notes (Signed)
Subjective:    Patient ID: Carlos Brooks, male    DOB: 1926-08-17, 81 y.o.   MRN: [id number]  HPI Pt here for follow up and management of chronic medical problems which includes hyperlipidemia. He is taking medications regularly. The patient has a history of COPD GERD and hyperlipidemia. He has had headache congestion and cough for 3 days. He continues to have neck pain radiating to both shoulders and he is been seen by the neurosurgeon for this. He is due today to get an FOBT and he is due soon for a chest x-ray. He is also due to get lab work. He is using his inhaler regularly but is not using a long acting bronchodilator. The patient is not using his albuterol inhaler regularly. He is taking Mucinex. He has been running some low-grade fever with this cough and congestion. He's also had a headache. As far as the neck pain is concerned he has seen the neurosurgeon regularly and has had up to this point in time 3 injections and was told by the neurosurgeon if he continued to have neck pain he would need to see him again for a further examination before he could do any more injections. His GI tract and urinary tract symptoms are stable. He is not having any chest pain that his significant other than soreness with coughing.       Patient Active Problem List   Diagnosis Date Noted  . DYSPHAGIA UNSPECIFIED 06/20/2007  . ALLERGIC RHINITIS 04/28/2007  . LUNG NODULE 04/28/2007  . HYPERLIPIDEMIA 03/28/2007  . COPD 03/28/2007  . GERD 03/28/2007  . BENIGN PROSTATIC HYPERTROPHY, HX OF 03/28/2007   Outpatient Encounter Prescriptions as of 04/17/2014  Medication Sig  . albuterol (PROVENTIL) (2.5 MG/3ML) 0.083% nebulizer solution Take 3 mLs (2.5 mg total) by nebulization every 6 (six) hours as needed. Dx: 496  . aspirin 81 MG EC tablet Take 81 mg by mouth daily.    . calcium carbonate (OS-CAL) 600 MG TABS Take 600 mg by mouth 2 (two) times daily with a meal.    . Cholecalciferol (VITAMIN D3) 2000  UNITS TABS Take 1 tablet by mouth daily.    . finasteride (PROSCAR) 5 MG tablet TAKE 1 TABLET DAILY  . guaiFENesin (MUCINEX) 600 MG 12 hr tablet Take 1,200 mg by mouth 2 (two) times daily.    Marland Kitchen LORazepam (ATIVAN) 0.5 MG tablet TAKE 1 TABLET DAILY AS NEEDED  . omeprazole (PRILOSEC) 40 MG capsule TAKE (1) CAPSULE TWICE DAILY.  Marland Kitchen terazosin (HYTRIN) 5 MG capsule TAKE ONE CAPSULE EVERY DAY  . fluticasone (FLONASE) 50 MCG/ACT nasal spray Place 2 sprays into the nose daily.  . [DISCONTINUED] amoxicillin (AMOXIL) 875 MG tablet Take 1 tablet (875 mg total) by mouth 2 (two) times daily.  . [DISCONTINUED] benzonatate (TESSALON) 100 MG capsule Take 1 capsule (100 mg total) by mouth 2 (two) times daily as needed for cough.  . [DISCONTINUED] budesonide-formoterol (SYMBICORT) 160-4.5 MCG/ACT inhaler Inhale 2 puffs into the lungs 2 (two) times daily.  . [DISCONTINUED] sulfamethoxazole-trimethoprim (BACTRIM DS) 800-160 MG per tablet Take 1 tablet by mouth 2 (two) times daily.     Review of Systems  Constitutional: Negative.   HENT: Positive for congestion and postnasal drip.   Eyes: Negative.   Respiratory: Positive for cough (started saturday.).   Cardiovascular: Negative.   Gastrointestinal: Negative.   Endocrine: Negative.   Genitourinary: Negative.   Musculoskeletal: Positive for neck pain (rediates down into shoulders- sees Dr Nelva Bush for this).  Skin: Negative.   Allergic/Immunologic: Negative.   Neurological: Positive for headaches.  Hematological: Negative.   Psychiatric/Behavioral: Negative.        Objective:   Physical Exam  Constitutional: He is oriented to person, place, and time. He appears well-developed and well-nourished. No distress.  The patient is alert and appears somewhat fatigued.  HENT:  Head: Normocephalic and atraumatic.  Right Ear: External ear normal.  Left Ear: External ear normal.  Nose: Nose normal.  Mouth/Throat: Oropharynx is clear and moist. No oropharyngeal  exudate.  There are no carotid bruits. There is no thyromegaly. There is mild nasal congestion bilaterally  Eyes: Conjunctivae and EOM are normal. Pupils are equal, round, and reactive to light. Right eye exhibits no discharge. Left eye exhibits no discharge. No scleral icterus.  Neck: Normal range of motion. Neck supple. No thyromegaly present.  Cardiovascular: Normal rate, regular rhythm, normal heart sounds and intact distal pulses.  Exam reveals no gallop and no friction rub.   No murmur heard. The heart is regular at 72/m  Pulmonary/Chest: Effort normal. No respiratory distress. He has wheezes. He has no rales. He exhibits no tenderness.  There is inspiratory and expiratory wheezes. There are no rales or rhonchi.  Abdominal: Soft. Bowel sounds are normal. He exhibits no mass. There is no tenderness. There is no rebound and no guarding.  There is no abdominal tenderness or abdominal bruits.  Musculoskeletal: Normal range of motion. He exhibits no edema or tenderness.  Lymphadenopathy:    He has no cervical adenopathy.  Neurological: He is alert and oriented to person, place, and time. He has normal reflexes. No cranial nerve deficit.  Skin: Skin is warm and dry. No rash noted. No erythema. No pallor.  Psychiatric: He has a normal mood and affect. His behavior is normal. Judgment and thought content normal.  Nursing note and vitals reviewed.  BP 142/81 mmHg  Pulse 80  Temp(Src) 99 F (37.2 C) (Oral)  Ht 5' 6"  (1.676 m)  Wt 141 lb (63.957 kg)  BMI 22.77 kg/m2  Results for orders placed or performed in visit on 04/17/14  POCT CBC  Result Value Ref Range   WBC 14.7 (A) 4.6 - 10.2 K/uL   Lymph, poc 2.4 0.6 - 3.4   POC LYMPH PERCENT 16.6 10 - 50 %L   POC Granulocyte 11.6 (A) 2 - 6.9   Granulocyte percent 78.8 37 - 80 %G   RBC 4.8 4.69 - 6.13 M/uL   Hemoglobin 13.6 (A) 14.1 - 18.1 g/dL   HCT, POC 43.8 43.5 - 53.7 %   MCV 91.5 80 - 97 fL   MCH, POC 28.5 27 - 31.2 pg   MCHC 31.1  (A) 31.8 - 35.4 g/dL   RDW, POC 14.2 %   Platelet Count, POC 235.0 142 - 424 K/uL   MPV 7.8 0 - 99.8 fL   The patient was informed of the CBC result before he left the office.  [facility] reading (PRIMARY) by  Dr. Brunilda Payor x-ray- no active disease, atherosclerosis of the thoracic aorta.                                       Assessment & Plan:  1. Gastroesophageal reflux disease, esophagitis presence not specified -The patient is doing well with this at this time and not having any problems and should continue with his omeprazole. - POCT CBC  2.  Vitamin D deficiency -The patient should continue with his vitamin D3 2000 daily and any change in this will be noted with the upcoming lab work which is being done today. - POCT CBC - Vit D  25 hydroxy (rtn osteoporosis monitoring)  3. Hyperlipidemia -Since the patient is statin intolerant he will have to continue with diet and exercise for controlling his lipids and he should continue to do this. - POCT CBC - BMP8+EGFR - NMR, lipoprofile - Hepatic function panel  4. Cough -He'll be asked to continue with his Mucinex, and good pulmonary hygiene as noted in the patient instructions. - DG Chest 2 View; Future - methylPREDNISolone acetate (DEPO-MEDROL) injection 60 mg; Inject 0.75 mLs (60 mg total) into the muscle once.  5. Bronchitis, acute, with bronchospasm -Take antibiotic as directed -Take prednisone as directed -Practice good pulmonary hygiene as noted and patient instructions  6. COPD exacerbation -Use inhalers as directed and this includes long acting bronchodilator as well as needed albuterol inhaler  7. Atherosclerosis of the thoracic aorta   Meds ordered this encounter  Medications  . predniSONE (DELTASONE) 10 MG tablet    Sig: TAPER- 1 tab by mouth four times daily for 2 days, 1 tab by mouth three times daily for 2 days, 1 tab by mouth twice a day for 2 days, then 1 tab by mouth daily for 2 days, then stop.    Dispense:   20 tablet    Refill:  0  . levofloxacin (LEVAQUIN) 500 MG tablet    Sig: Take 1 tablet (500 mg total) by mouth daily.    Dispense:  7 tablet    Refill:  0  . methylPREDNISolone acetate (DEPO-MEDROL) injection 60 mg    Sig:    Patient Instructions                       Medicare Annual Wellness Visit  [facility] and the medical providers at [facility] strive to bring you the best medical care.  In doing so we not only want to address your current medical conditions and concerns but also to detect new conditions early and prevent illness, disease and health-related problems.    Medicare offers a yearly Wellness Visit which allows our clinical staff to assess your need for preventative services including immunizations, lifestyle education, counseling to decrease risk of preventable diseases and screening for fall risk and other medical concerns.    This visit is provided free of charge (no copay) for all Medicare recipients. The clinical pharmacists at [facility] have begun to conduct these Wellness Visits which will also include a thorough review of all your medications.    As you primary medical provider recommend that you make an appointment for your Annual Wellness Visit if you have not done so already this year.  You may set up this appointment before you leave today or you may call back ([phone]) and schedule an appointment.  Please make sure when you call that you mention that you are scheduling your Annual Wellness Visit with the clinical pharmacist so that the appointment may be made for the proper length of time.     Continue current medications. Continue good therapeutic lifestyle changes which include good diet and exercise. Fall precautions discussed with patient. If an FOBT was given today- please return it to our front desk. If you are over 45 years old - you may need Prevnar 80 or the adult Pneumonia vaccine.  Flu Shots are  still available at our office. If you still haven't had one please call to set up a nurse visit to get one.   After your visit with [place] today you will receive a survey in the mail or online from [employer] regarding your care with [place]. Please take a moment to fill this out. Your feedback is very important to [place] as you can help [place] better understand your patient needs as well as improve your experience and satisfaction. WE CARE ABOUT YOU!!!   The patient should continue to drink plenty of fluids and practice good pulmonary hygiene. He should keep the house as cool as possible and use a cool mist humidifier. He should also use nasal saline and continue to take Mucinex plain, blue and white in color with a large glass of water for cough and congestion He should take Tylenol for aches pains and fever He should take the antibiotic and the prednisone that we are calling in today as directed    Use the sample inhaler and use 1 puff daily and rinse mouth after using Arrie Senate MD

## 2014-04-17 NOTE — Patient Instructions (Addendum)
Medicare Annual Wellness Visit  [facility] and the medical providers at [facility] strive to bring you the best medical care.  In doing so we not only want to address your current medical conditions and concerns but also to detect new conditions early and prevent illness, disease and health-related problems.    Medicare offers a yearly Wellness Visit which allows our clinical staff to assess your need for preventative services including immunizations, lifestyle education, counseling to decrease risk of preventable diseases and screening for fall risk and other medical concerns.    This visit is provided free of charge (no copay) for all Medicare recipients. The clinical pharmacists at [facility] have begun to conduct these Wellness Visits which will also include a thorough review of all your medications.    As you primary medical provider recommend that you make an appointment for your Annual Wellness Visit if you have not done so already this year.  You may set up this appointment before you leave today or you may call back ([phone]) and schedule an appointment.  Please make sure when you call that you mention that you are scheduling your Annual Wellness Visit with the clinical pharmacist so that the appointment may be made for the proper length of time.     Continue current medications. Continue good therapeutic lifestyle changes which include good diet and exercise. Fall precautions discussed with patient. If an FOBT was given today- please return it to our front desk. If you are over 68 years old - you may need Prevnar 44 or the adult Pneumonia vaccine.  Flu Shots are still available at our office. If you still haven't had one please call to set up a nurse visit to get one.   After your visit with [place] today you will receive a survey in the mail or online from [employer] regarding your care with [place]. Please take a moment to  fill this out. Your feedback is very important to [place] as you can help [place] better understand your patient needs as well as improve your experience and satisfaction. WE CARE ABOUT YOU!!!   The patient should continue to drink plenty of fluids and practice good pulmonary hygiene. He should keep the house as cool as possible and use a cool mist humidifier. He should also use nasal saline and continue to take Mucinex plain, blue and white in color with a large glass of water for cough and congestion He should take Tylenol for aches pains and fever He should take the antibiotic and the prednisone that we are calling in today as directed The patient should use his long-acting bronchodilator which is Anoro--- one puff daily and rinse mouth after using samples of this were given to the patient to use.

## 2014-04-18 LAB — BMP8+EGFR
BUN / CREAT RATIO: 16 (ref 10–22)
BUN: 16 mg/dL (ref 8–27)
CHLORIDE: 98 mmol/L (ref 97–108)
CO2: 25 mmol/L (ref 18–29)
Calcium: 9.8 mg/dL (ref 8.6–10.2)
Creatinine, Ser: 1.02 mg/dL (ref 0.76–1.27)
GFR calc Af Amer: 77 mL/min/{1.73_m2} (ref >59)
GFR calc non Af Amer: 67 mL/min/{1.73_m2} (ref >59)
Glucose: 92 mg/dL (ref 65–99)
POTASSIUM: 4.7 mmol/L (ref 3.5–5.2)
SODIUM: 138 mmol/L (ref 134–144)

## 2014-04-18 LAB — NMR, LIPOPROFILE
CHOLESTEROL: 213 mg/dL — AB (ref 100–199)
HDL Cholesterol by NMR: 54 mg/dL (ref >39)
HDL Particle Number: 31.4 umol/L (ref >=30.5)
LDL PARTICLE NUMBER: 1472 nmol/L — AB (ref <1000)
LDL SIZE: 20.6 nm (ref >20.5)
LDL-C: 136 mg/dL — ABNORMAL HIGH (ref 0–99)
LP-IR Score: 31 (ref <=45)
Small LDL Particle Number: 630 nmol/L — ABNORMAL HIGH (ref <=527)
TRIGLYCERIDES BY NMR: 115 mg/dL (ref 0–149)

## 2014-04-18 LAB — HEPATIC FUNCTION PANEL
ALK PHOS: 60 IU/L (ref 39–117)
ALT: 9 IU/L (ref 0–44)
AST: 12 IU/L (ref 0–40)
Albumin: 4.3 g/dL (ref 3.5–4.7)
Bilirubin Total: 0.5 mg/dL (ref 0.0–1.2)
Bilirubin, Direct: 0.15 mg/dL (ref 0.00–0.40)
TOTAL PROTEIN: 6.9 g/dL (ref 6.0–8.5)

## 2014-04-18 LAB — VITAMIN D 25 HYDROXY (VIT D DEFICIENCY, FRACTURES): Vit D, 25-Hydroxy: 27.4 ng/mL — ABNORMAL LOW (ref 30.0–100.0)

## 2014-04-18 MED ORDER — VITAMIN D (ERGOCALCIFEROL) 1.25 MG (50000 UNIT) PO CAPS: 50000.0000 [IU] | ORAL_CAPSULE | ORAL | Status: DC

## 2014-04-18 NOTE — Addendum Note (Signed)
Addended by: Marylin Crosby on: 04/18/2014 09:00 AM   Modules accepted: Orders

## 2014-04-24 ENCOUNTER — Telehealth: Payer: Self-pay | Admitting: Family Medicine

## 2014-04-24 MED ORDER — DOXYCYCLINE HYCLATE 100 MG PO TABS: 100.0000 mg | ORAL_TABLET | Freq: Two times a day (BID) | ORAL | Status: DC

## 2014-04-24 NOTE — Telephone Encounter (Signed)
Patient completed prednisone and levaquin yesterday and he still seems stopped up, he states he is not coughing, he states that he unstable in his feet. He states he can not get in today and wants to know if you can send in another antibiotic.

## 2014-04-24 NOTE — Telephone Encounter (Signed)
Pharm and pt notified

## 2014-04-24 NOTE — Telephone Encounter (Signed)
Please try doxycycline 100 mg twice daily for 10 days with food If possible repeat CBC and a couple of days Reviewing his last visit the white blood cell count was elevated. The chest x-ray was read as no active disease. His kidney function test was good as far as the creatinine is concerned. He should continue with Mucinex and drink plenty of fluids

## 2014-04-26 ENCOUNTER — Other Ambulatory Visit (INDEPENDENT_AMBULATORY_CARE_PROVIDER_SITE_OTHER): Payer: PPO

## 2014-04-26 DIAGNOSIS — D72829 Elevated white blood cell count, unspecified: Secondary | ICD-10-CM

## 2014-04-26 NOTE — Addendum Note (Signed)
Addended by: Wyline Mood on: 04/26/2014 08:58 AM   Modules accepted: Orders

## 2014-04-26 NOTE — Progress Notes (Signed)
Lab only 

## 2014-04-26 NOTE — Addendum Note (Signed)
Addended by: Earlene Plater on: 04/26/2014 09:39 AM   Modules accepted: Orders

## 2014-04-27 LAB — CBC WITH DIFFERENTIAL/PLATELET
BASOS ABS: 0.1 10*3/uL (ref 0.0–0.2)
Basos: 1 %
EOS: 2 %
Eosinophils Absolute: 0.3 10*3/uL (ref 0.0–0.4)
HCT: 44.3 % (ref 37.5–51.0)
Hemoglobin: 14.8 g/dL (ref 12.6–17.7)
Lymphocytes Absolute: 3.6 10*3/uL — ABNORMAL HIGH (ref 0.7–3.1)
Lymphs: 26 %
MCH: 30.5 pg (ref 26.6–33.0)
MCHC: 33.4 g/dL (ref 31.5–35.7)
MCV: 91 fL (ref 79–97)
Monocytes Absolute: 1.4 10*3/uL — ABNORMAL HIGH (ref 0.1–0.9)
Monocytes: 10 %
NEUTROS PCT: 54 %
Neutrophils Absolute: 7.6 10*3/uL — ABNORMAL HIGH (ref 1.4–7.0)
PLATELETS: 290 10*3/uL (ref 150–379)
RBC: 4.85 x10E6/uL (ref 4.14–5.80)
RDW: 14.5 % (ref 12.3–15.4)
WBC: 13.8 10*3/uL — AB (ref 3.4–10.8)

## 2014-04-27 LAB — IMMATURE CELLS
Bands(Auto) Relative: 1 %
Metamyelocytes: 4 % — ABNORMAL HIGH (ref 0–0)
Myelocytes: 2 % — ABNORMAL HIGH (ref 0–0)

## 2014-04-30 ENCOUNTER — Telehealth: Payer: Self-pay | Admitting: Family Medicine

## 2014-04-30 NOTE — Telephone Encounter (Signed)
Ok, pt to come tomorrow

## 2014-05-01 ENCOUNTER — Ambulatory Visit (INDEPENDENT_AMBULATORY_CARE_PROVIDER_SITE_OTHER): Payer: PPO | Admitting: Family Medicine

## 2014-05-01 ENCOUNTER — Encounter: Payer: Self-pay | Admitting: Family Medicine

## 2014-05-01 VITALS — BP 133/71 | HR 60 | Temp 96.9°F | Ht 66.0 in | Wt 140.0 lb

## 2014-05-01 DIAGNOSIS — J441 Chronic obstructive pulmonary disease with (acute) exacerbation: Secondary | ICD-10-CM

## 2014-05-01 DIAGNOSIS — R062 Wheezing: Secondary | ICD-10-CM

## 2014-05-01 DIAGNOSIS — J209 Acute bronchitis, unspecified: Secondary | ICD-10-CM

## 2014-05-01 LAB — POCT CBC
GRANULOCYTE PERCENT: 62.4 % (ref 37–80)
HEMATOCRIT: 45 % (ref 43.5–53.7)
Hemoglobin: 14.1 g/dL (ref 14.1–18.1)
Lymph, poc: 3 (ref 0.6–3.4)
MCH, POC: 28.8 pg (ref 27–31.2)
MCHC: 31.4 g/dL — AB (ref 31.8–35.4)
MCV: 91.7 fL (ref 80–97)
MPV: 6.9 fL (ref 0–99.8)
POC Granulocyte: 6.8 (ref 2–6.9)
POC LYMPH PERCENT: 27.3 %L (ref 10–50)
Platelet Count, POC: 240 10*3/uL (ref 142–424)
RBC: 4.9 M/uL (ref 4.69–6.13)
RDW, POC: 14.6 %
WBC: 10.9 10*3/uL — AB (ref 4.6–10.2)

## 2014-05-01 NOTE — Patient Instructions (Addendum)
Continue using Anoro elip--1 puff once daily Continue taking Mucinex and finish doxycycline See Dr. Luan Pulling as planned the middle of next month Increase activity gradually

## 2014-05-01 NOTE — Progress Notes (Signed)
Subjective:    Patient ID: Carlos Brooks, male    DOB: [birth year], 81 y.o.   MRN: [id number]  HPI Patient here today for 2-3 week follow up on bronchitis. He is still having some congestion, but is feeling some better. The patient also complains of some lightheadedness with standing. He is finished his prednisone and is still taking his doxycycline and has a few of these left.  The chest x-ray at the last visit was reviewed and showed no active disease.        Patient Active Problem List   Diagnosis Date Noted  . DYSPHAGIA UNSPECIFIED 06/20/2007  . ALLERGIC RHINITIS 04/28/2007  . LUNG NODULE 04/28/2007  . HYPERLIPIDEMIA 03/28/2007  . COPD 03/28/2007  . GERD 03/28/2007  . BENIGN PROSTATIC HYPERTROPHY, HX OF 03/28/2007   Outpatient Encounter Prescriptions as of 05/01/2014  Medication Sig  . albuterol (PROVENTIL) (2.5 MG/3ML) 0.083% nebulizer solution Take 3 mLs (2.5 mg total) by nebulization every 6 (six) hours as needed. Dx: 496  . aspirin 81 MG EC tablet Take 81 mg by mouth daily.    . calcium carbonate (OS-CAL) 600 MG TABS Take 600 mg by mouth 2 (two) times daily with a meal.    . Cholecalciferol (VITAMIN D3) 2000 UNITS TABS Take 1 tablet by mouth daily.    Marland Kitchen doxycycline (VIBRA-TABS) 100 MG tablet Take 1 tablet (100 mg total) by mouth 2 (two) times daily.  . finasteride (PROSCAR) 5 MG tablet TAKE 1 TABLET DAILY  . fluticasone (FLONASE) 50 MCG/ACT nasal spray Place 2 sprays into the nose daily.  Marland Kitchen guaiFENesin (MUCINEX) 600 MG 12 hr tablet Take 1,200 mg by mouth 2 (two) times daily.    Marland Kitchen LORazepam (ATIVAN) 0.5 MG tablet TAKE 1 TABLET DAILY AS NEEDED  . omeprazole (PRILOSEC) 40 MG capsule TAKE (1) CAPSULE TWICE DAILY.  Marland Kitchen terazosin (HYTRIN) 5 MG capsule TAKE ONE CAPSULE EVERY DAY  . Vitamin D, Ergocalciferol, (DRISDOL) 50000 UNITS CAPS capsule Take 1 capsule (50,000 Units total) by mouth every 7 (seven) days.  . [DISCONTINUED] predniSONE (DELTASONE) 10 MG tablet TAPER- 1 tab by  mouth four times daily for 2 days, 1 tab by mouth three times daily for 2 days, 1 tab by mouth twice a day for 2 days, then 1 tab by mouth daily for 2 days, then stop.  . [DISCONTINUED] levofloxacin (LEVAQUIN) 500 MG tablet Take 1 tablet (500 mg total) by mouth daily.    Review of Systems  Constitutional: Negative.   HENT: Positive for congestion.   Eyes: Negative.   Respiratory: Negative.   Cardiovascular: Negative.   Gastrointestinal: Negative.   Endocrine: Negative.   Genitourinary: Negative.   Musculoskeletal: Negative.   Skin: Negative.   Allergic/Immunologic: Negative.   Neurological: Positive for light-headedness (with standing).  Hematological: Negative.   Psychiatric/Behavioral: Negative.        Objective:   Physical Exam  Constitutional: He is oriented to person, place, and time. He appears well-developed and well-nourished. No distress.  The patient appears less stressed and breathing better than when we saw him at the last visit.  HENT:  Head: Normocephalic and atraumatic.  Right Ear: External ear normal.  Left Ear: External ear normal.  Nose: Nose normal.  Mouth/Throat: Oropharynx is clear and moist. No oropharyngeal exudate.  Oral mucosa is well-hydrated  Eyes: Conjunctivae and EOM are normal. Pupils are equal, round, and reactive to light. Right eye exhibits no discharge. Left eye exhibits no discharge. No scleral icterus.  Neck:  Normal range of motion. Neck supple. No thyromegaly present.  Cardiovascular: Normal rate, regular rhythm, normal heart sounds and intact distal pulses.  Exam reveals no gallop and no friction rub.   No murmur heard. The heart is regular at 72/m  Pulmonary/Chest: Effort normal and breath sounds normal. No respiratory distress. He has no wheezes. He has no rales. He exhibits no tenderness.  The lungs are much clearer and with minimal wheezes. There is slight congestion with coughing.  Musculoskeletal: Normal range of motion. He exhibits  no edema.  Lymphadenopathy:    He has no cervical adenopathy.  Neurological: He is alert and oriented to person, place, and time.  Skin: Skin is warm and dry. No rash noted.  Psychiatric: He has a normal mood and affect. His behavior is normal. Judgment and thought content normal.  Nursing note and vitals reviewed.  BP 133/71 mmHg  Pulse 60  Temp(Src) 96.9 F (36.1 C) (Oral)  Ht 5\' 6"  (1.676 m)  Wt 140 lb (63.504 kg)  BMI 22.61 kg/m2  SpO2 98%        Assessment & Plan:  1. Acute bronchitis, unspecified organism --Antibiotic should be finished - POCT CBC  2. Wheezing -Continue with Anoro, 1 puff daily  3. COPD exacerbation -This is improved and he should continue with his inhaler and Mucinex -Follow up with Dr. Luan Pulling as planned  Patient Instructions  Continue using Anoro elip--1 puff once daily Continue taking Mucinex and finish doxycycline See Dr. Luan Pulling as planned the middle of next month Increase activity gradually   Arrie Senate MD

## 2014-05-08 ENCOUNTER — Other Ambulatory Visit: Payer: Self-pay | Admitting: Family Medicine

## 2014-05-08 NOTE — Telephone Encounter (Signed)
Last filled 04/09/14, last seen 04/17/14. Call in at [facility]

## 2014-05-22 ENCOUNTER — Other Ambulatory Visit: Payer: Self-pay | Admitting: Family Medicine

## 2014-06-05 ENCOUNTER — Ambulatory Visit (INDEPENDENT_AMBULATORY_CARE_PROVIDER_SITE_OTHER): Payer: PPO

## 2014-06-05 ENCOUNTER — Encounter: Payer: Self-pay | Admitting: Family Medicine

## 2014-06-05 ENCOUNTER — Ambulatory Visit (INDEPENDENT_AMBULATORY_CARE_PROVIDER_SITE_OTHER): Payer: PPO | Admitting: Family Medicine

## 2014-06-05 VITALS — BP 146/81 | HR 62 | Temp 96.2°F | Ht 66.0 in | Wt 142.0 lb

## 2014-06-05 DIAGNOSIS — J418 Mixed simple and mucopurulent chronic bronchitis: Secondary | ICD-10-CM | POA: Diagnosis not present

## 2014-06-05 DIAGNOSIS — R05 Cough: Secondary | ICD-10-CM

## 2014-06-05 DIAGNOSIS — R059 Cough, unspecified: Secondary | ICD-10-CM

## 2014-06-05 DIAGNOSIS — J441 Chronic obstructive pulmonary disease with (acute) exacerbation: Secondary | ICD-10-CM | POA: Diagnosis not present

## 2014-06-05 LAB — POCT CBC
Granulocyte percent: 61.2 %G (ref 37–80)
HCT, POC: 44.9 % (ref 43.5–53.7)
Hemoglobin: 13.8 g/dL — AB (ref 14.1–18.1)
LYMPH, POC: 2.7 (ref 0.6–3.4)
MCH, POC: 28.2 pg (ref 27–31.2)
MCHC: 30.7 g/dL — AB (ref 31.8–35.4)
MCV: 92 fL (ref 80–97)
MPV: 7 fL (ref 0–99.8)
POC Granulocyte: 5.1 (ref 2–6.9)
POC LYMPH PERCENT: 32.1 %L (ref 10–50)
Platelet Count, POC: 223 10*3/uL (ref 142–424)
RBC: 4.88 M/uL (ref 4.69–6.13)
RDW, POC: 14.5 %
WBC: 8.3 10*3/uL (ref 4.6–10.2)

## 2014-06-05 NOTE — Progress Notes (Signed)
Subjective:    Patient ID: Carlos Brooks, male    DOB: Aug 28, 1926, 81 y.o.   MRN: [id number]  HPI Patient here today for follow up on bronchitis. He is feeling some better and has completed all antibiotics. The patient recently saw the pulmonologist and was placed on doxycycline because of suspected pneumonia. He has finished this antibiotic and is feeling somewhat better.  The patient has been using his Anoro regularly.      Patient Active Problem List   Diagnosis Date Noted  . DYSPHAGIA UNSPECIFIED 06/20/2007  . ALLERGIC RHINITIS 04/28/2007  . LUNG NODULE 04/28/2007  . HYPERLIPIDEMIA 03/28/2007  . COPD 03/28/2007  . GERD 03/28/2007  . BENIGN PROSTATIC HYPERTROPHY, HX OF 03/28/2007   Outpatient Encounter Prescriptions as of 06/05/2014  Medication Sig  . albuterol (PROVENTIL) (2.5 MG/3ML) 0.083% nebulizer solution Take 3 mLs (2.5 mg total) by nebulization every 6 (six) hours as needed. Dx: 496  . aspirin 81 MG EC tablet Take 81 mg by mouth daily.    . calcium carbonate (OS-CAL) 600 MG TABS Take 600 mg by mouth 2 (two) times daily with a meal.    . Cholecalciferol (VITAMIN D3) 2000 UNITS TABS Take 1 tablet by mouth daily.    . finasteride (PROSCAR) 5 MG tablet TAKE 1 TABLET DAILY  . fluticasone (FLONASE) 50 MCG/ACT nasal spray Place 2 sprays into the nose daily.  Marland Kitchen guaiFENesin (MUCINEX) 600 MG 12 hr tablet Take 1,200 mg by mouth 2 (two) times daily.    Marland Kitchen LORazepam (ATIVAN) 0.5 MG tablet TAKE 1 TABLET BY MOUTH EVERY DAY AS NEEDED  . omeprazole (PRILOSEC) 40 MG capsule TAKE (1) CAPSULE TWICE DAILY.  Marland Kitchen terazosin (HYTRIN) 5 MG capsule TAKE ONE CAPSULE EVERY DAY  . Vitamin D, Ergocalciferol, (DRISDOL) 50000 UNITS CAPS capsule Take 1 capsule (50,000 Units total) by mouth every 7 (seven) days.  . [DISCONTINUED] doxycycline (VIBRA-TABS) 100 MG tablet Take 1 tablet (100 mg total) by mouth 2 (two) times daily.    Review of Systems  Constitutional: Negative.   HENT: Positive for  congestion (slight).   Eyes: Negative.   Respiratory: Positive for cough (slight).   Cardiovascular: Negative.   Gastrointestinal: Negative.   Endocrine: Negative.   Genitourinary: Negative.   Musculoskeletal: Negative.   Skin: Negative.   Allergic/Immunologic: Negative.   Neurological: Negative.   Hematological: Negative.   Psychiatric/Behavioral: Negative.        Objective:   Physical Exam  Constitutional: He is oriented to person, place, and time. He appears well-developed and well-nourished.  HENT:  Head: Normocephalic and atraumatic.  Right Ear: External ear normal.  Left Ear: External ear normal.  Nose: Nose normal.  Mouth/Throat: Oropharynx is clear and moist. No oropharyngeal exudate.  Eyes: Conjunctivae and EOM are normal. Pupils are equal, round, and reactive to light. Right eye exhibits discharge. Left eye exhibits no discharge.  Neck: Normal range of motion. Neck supple. No thyromegaly present.  Cardiovascular: Normal rate, regular rhythm and normal heart sounds.   No murmur heard. Pulmonary/Chest: Effort normal. No respiratory distress. He has wheezes. He has no rales.  The patient has expiratory wheezes and some rhonchi with coughing. The lung bases were clear.  Musculoskeletal: Normal range of motion. He exhibits no edema.  Neurological: He is alert and oriented to person, place, and time.  Skin: Skin is warm and dry. No rash noted.  Psychiatric: He has a normal mood and affect. His behavior is normal. Judgment and thought content normal.  Nursing note and vitals reviewed.  BP 146/81 mmHg  Pulse 62  Temp(Src) 96.2 F (35.7 C) (Oral)  Ht 5\' 6"  (1.676 m)  Wt 142 lb (64.411 kg)  BMI 22.93 kg/m2  [facility] reading (PRIMARY) by  Dr. Brunilda Payor x-ray- no active disease                                       Assessment & Plan:  1. Cough -The patient should continue to take Mucinex regularly twice daily - DG Chest 2 View; Future - POCT CBC  2. Mixed simple  and mucopurulent chronic bronchitis -Use rescue inhaler as needed  3. COPD exacerbation -This is improved and patient should remain on his Anoro inhaler--1 puff daily  Patient Instructions  The patient should continue to take Mucinex regularly twice daily with a large glass of water He should avoid irritating environments as much as possible He should continue to use his albuterol inhaler as a rescue inhaler He should use the Anoro 1 puff daily He should continue to drink plenty of fluids   Arrie Senate MD

## 2014-06-05 NOTE — Patient Instructions (Signed)
The patient should continue to take Mucinex regularly twice daily with a large glass of water He should avoid irritating environments as much as possible He should continue to use his albuterol inhaler as a rescue inhaler He should use the Anoro 1 puff daily He should continue to drink plenty of fluids

## 2014-08-09 ENCOUNTER — Other Ambulatory Visit: Payer: Self-pay | Admitting: Nurse Practitioner

## 2014-08-09 ENCOUNTER — Other Ambulatory Visit: Payer: Self-pay | Admitting: Family Medicine

## 2014-08-09 NOTE — Telephone Encounter (Signed)
Last seen 06/05/14  DWM  If approved route to nurse to call into [facility]

## 2014-08-10 ENCOUNTER — Other Ambulatory Visit: Payer: Self-pay | Admitting: Family Medicine

## 2014-08-10 NOTE — Telephone Encounter (Signed)
Please call in ativan with 1 refills 

## 2014-08-10 NOTE — Telephone Encounter (Signed)
[facility] pt, last filled 07/12/14, last seen 06/05/14. Call into [facility]

## 2014-08-30 ENCOUNTER — Ambulatory Visit (INDEPENDENT_AMBULATORY_CARE_PROVIDER_SITE_OTHER): Payer: PPO | Admitting: Family Medicine

## 2014-08-30 ENCOUNTER — Encounter: Payer: Self-pay | Admitting: Family Medicine

## 2014-08-30 VITALS — BP 132/60 | HR 48 | Temp 96.8°F | Ht 66.0 in | Wt 145.0 lb

## 2014-08-30 DIAGNOSIS — R3989 Other symptoms and signs involving the genitourinary system: Secondary | ICD-10-CM | POA: Diagnosis not present

## 2014-08-30 DIAGNOSIS — R39198 Other difficulties with micturition: Secondary | ICD-10-CM

## 2014-08-30 DIAGNOSIS — R441 Visual hallucinations: Secondary | ICD-10-CM

## 2014-08-30 DIAGNOSIS — K219 Gastro-esophageal reflux disease without esophagitis: Secondary | ICD-10-CM

## 2014-08-30 DIAGNOSIS — E559 Vitamin D deficiency, unspecified: Secondary | ICD-10-CM

## 2014-08-30 DIAGNOSIS — J439 Emphysema, unspecified: Secondary | ICD-10-CM

## 2014-08-30 DIAGNOSIS — I700 Atherosclerosis of aorta: Secondary | ICD-10-CM | POA: Diagnosis not present

## 2014-08-30 DIAGNOSIS — E785 Hyperlipidemia, unspecified: Secondary | ICD-10-CM | POA: Diagnosis not present

## 2014-08-30 LAB — POCT UA - MICROSCOPIC ONLY
Casts, Ur, LPF, POC: NEGATIVE
Crystals, Ur, HPF, POC: NEGATIVE
MUCUS UA: NEGATIVE
WBC, UR, HPF, POC: NEGATIVE
Yeast, UA: NEGATIVE

## 2014-08-30 LAB — POCT CBC
GRANULOCYTE PERCENT: 66 % (ref 37–80)
HCT, POC: 44.6 % (ref 43.5–53.7)
Hemoglobin: 14 g/dL — AB (ref 14.1–18.1)
Lymph, poc: 2.7 (ref 0.6–3.4)
MCH, POC: 28.8 pg (ref 27–31.2)
MCHC: 31.3 g/dL — AB (ref 31.8–35.4)
MCV: 92 fL (ref 80–97)
MPV: 7.8 fL (ref 0–99.8)
PLATELET COUNT, POC: 199 10*3/uL (ref 142–424)
POC GRANULOCYTE: 7 — AB (ref 2–6.9)
POC LYMPH %: 25.9 % (ref 10–50)
RBC: 4.85 M/uL (ref 4.69–6.13)
RDW, POC: 13.8 %
WBC: 10.6 10*3/uL — AB (ref 4.6–10.2)

## 2014-08-30 LAB — POCT URINALYSIS DIPSTICK
BILIRUBIN UA: NEGATIVE
Glucose, UA: NEGATIVE
KETONES UA: NEGATIVE
Leukocytes, UA: NEGATIVE
Nitrite, UA: NEGATIVE
Protein, UA: NEGATIVE
Spec Grav, UA: 1.015
Urobilinogen, UA: NEGATIVE
pH, UA: 5

## 2014-08-30 NOTE — Patient Instructions (Addendum)
Medicare Annual Wellness Visit  [facility] and the medical providers at [facility] strive to bring you the best medical care.  In doing so we not only want to address your current medical conditions and concerns but also to detect new conditions early and prevent illness, disease and health-related problems.    Medicare offers a yearly Wellness Visit which allows our clinical staff to assess your need for preventative services including immunizations, lifestyle education, counseling to decrease risk of preventable diseases and screening for fall risk and other medical concerns.    This visit is provided free of charge (no copay) for all Medicare recipients. The clinical pharmacists at [facility] have begun to conduct these Wellness Visits which will also include a thorough review of all your medications.    As you primary medical provider recommend that you make an appointment for your Annual Wellness Visit if you have not done so already this year.  You may set up this appointment before you leave today or you may call back ([phone]) and schedule an appointment.  Please make sure when you call that you mention that you are scheduling your Annual Wellness Visit with the clinical pharmacist so that the appointment may be made for the proper length of time.     Continue current medications. Continue good therapeutic lifestyle changes which include good diet and exercise. Fall precautions discussed with patient. If an FOBT was given today- please return it to our front desk. If you are over 68 years old - you may need Prevnar 7 or the adult Pneumonia vaccine.  Flu Shots are still available at our office. If you still haven't had one please call to set up a nurse visit to get one.   After your visit with [place] today you will receive a survey in the mail or online from [employer] regarding your care with [place]. Please take a moment to  fill this out. Your feedback is very important to [place] as you can help [place] better understand your patient needs as well as improve your experience and satisfaction. WE CARE ABOUT YOU!!!   Continue follow-up with pulmonologist as planned If you have any more visual hallucinations please get back in touch with [place] and we may arrange to get a CT scan or MRI of your head and schedule a visit possibly with the neurologist. Please tell the ophthalmologist about these visual hallucinations. Please use your inhalers regularly As mentioned during the visit, take Zantac 150 before dinner every night and take your omeprazole in the morning before breakfast and see if this helps her swallowing issues and your heartburn [facility].

## 2014-08-30 NOTE — Progress Notes (Signed)
Subjective:    Patient ID: Carlos Brooks, male    DOB: [birth year], 81 y.o.   MRN: [id number]  HPI Pt here for follow up and management of chronic medical problems which includes hyperlipidemia and GERD. He is taking medications regularly. The patient sees the pulmonologist regularly. He complains today of some issues with visual hallucinations and this has occurred on 2 occasions 6 months apart in a doctor's office in [place]. He has recently seen the ophthalmologist and no problems were noted from that visit. The patient continues to have breathing but those problems are stable currently and he does see the pulmonologist for this. He denies chest pain. He indicates he does have the reflux issues and he is taking omeprazole but can only take it once a day because of GI side effects. He is also having episodes of slowness with voiding. And that is better presently but recently there was an episode where he had trouble passing his water at all.    Patient Active Problem List   Diagnosis Date Noted  . DYSPHAGIA UNSPECIFIED 06/20/2007  . ALLERGIC RHINITIS 04/28/2007  . LUNG NODULE 04/28/2007  . HYPERLIPIDEMIA 03/28/2007  . COPD 03/28/2007  . GERD 03/28/2007  . BENIGN PROSTATIC HYPERTROPHY, HX OF 03/28/2007   Outpatient Encounter Prescriptions as of 08/30/2014  Medication Sig  . albuterol (PROVENTIL) (2.5 MG/3ML) 0.083% nebulizer solution Take 3 mLs (2.5 mg total) by nebulization every 6 (six) hours as needed. Dx: 496  . aspirin 81 MG EC tablet Take 81 mg by mouth daily.    . calcium carbonate (OS-CAL) 600 MG TABS Take 600 mg by mouth 2 (two) times daily with a meal.    . Cholecalciferol (VITAMIN D3) 2000 UNITS TABS Take 1 tablet by mouth daily.    . finasteride (PROSCAR) 5 MG tablet TAKE 1 TABLET DAILY  . fluticasone (FLONASE) 50 MCG/ACT nasal spray Place 2 sprays into the nose daily.  Marland Kitchen guaiFENesin (MUCINEX) 600 MG 12 hr tablet Take 1,200 mg by mouth 2 (two) times daily.    Marland Kitchen  LORazepam (ATIVAN) 0.5 MG tablet TAKE 1 TABLET BY MOUTH EVERY DAY AS NEEDED  . omeprazole (PRILOSEC) 40 MG capsule TAKE (1) CAPSULE TWICE DAILY.  Marland Kitchen terazosin (HYTRIN) 5 MG capsule TAKE ONE CAPSULE EVERY DAY  . Vitamin D, Ergocalciferol, (DRISDOL) 50000 UNITS CAPS capsule Take 1 capsule (50,000 Units total) by mouth every 7 (seven) days.   No facility-administered encounter medications on file as of 08/30/2014.      Review of Systems  Constitutional: Negative.   HENT: Negative.   Eyes: Negative.   Respiratory: Negative.   Cardiovascular: Negative.   Gastrointestinal: Negative.   Endocrine: Negative.   Genitourinary: Positive for difficulty urinating. Dysuria: has the urge - can not go.  Musculoskeletal: Negative.   Skin: Negative.   Allergic/Immunologic: Negative.   Neurological: Positive for light-headedness (recent "spell" at Dr Nelva Bush office).  Hematological: Negative.   Psychiatric/Behavioral: Negative.        Objective:   Physical Exam  Constitutional: He is oriented to person, place, and time. He appears well-developed and well-nourished. No distress.  HENT:  Head: Normocephalic and atraumatic.  Right Ear: External ear normal.  Left Ear: External ear normal.  Nose: Nose normal.  Mouth/Throat: Oropharynx is clear and moist. No oropharyngeal exudate.  Eyes: Conjunctivae and EOM are normal. Pupils are equal, round, and reactive to light. Right eye exhibits no discharge. Left eye exhibits no discharge. No scleral icterus.  Neck: Normal range of  motion. Neck supple. No thyromegaly present.  Cardiovascular: Normal rate, regular rhythm, normal heart sounds and intact distal pulses.   No murmur heard. Pulmonary/Chest: Effort normal. No respiratory distress. He has wheezes. He has no rales. He exhibits no tenderness.  There are a few crackles in the left base posteriorly and some expiratory wheezes off and on bilaterally.  Abdominal: Soft. Bowel sounds are normal. He exhibits no  mass. There is no tenderness. There is no rebound and no guarding.  Musculoskeletal: Normal range of motion. He exhibits no edema or tenderness.  Lymphadenopathy:    He has no cervical adenopathy.  Neurological: He is alert and oriented to person, place, and time. He has normal reflexes. No cranial nerve deficit.  Skin: Skin is warm and dry. No rash noted. No erythema. No pallor.  Psychiatric: He has a normal mood and affect. His behavior is normal. Judgment and thought content normal.  Nursing note and vitals reviewed.   BP 132/60 mmHg  Pulse 48  Temp(Src) 96.8 F (36 C) (Oral)  Ht 5' 6"  (1.676 m)  Wt 145 lb (65.772 kg)  BMI 23.41 kg/m2       Assessment & Plan:  1. Gastroesophageal reflux disease, esophagitis presence not specified -The patient will continue with his omeprazole in the morning and will add Zantac 150 before dinner at nighttime - POCT CBC - Hepatic function panel  2. Vitamin D deficiency -Continue current vitamin D treatment pending results of lab work - POCT CBC - Vit D  25 hydroxy (rtn osteoporosis monitoring)  3. Hyperlipidemia -Continue with aggressive therapeutic lifestyle changes - POCT CBC - BMP8+EGFR - Hepatic function panel - NMR, lipoprofile  4. Thoracic aorta atherosclerosis -Is having no issues with this at the current time and no chest pain. - POCT CBC - NMR, lipoprofile  5. Difficulty urinating -He is taking finasteride he should continue with this as this will help his prostate enlargement - POCT CBC - POCT UA - Microscopic Only - POCT urinalysis dipstick  6. Visual hallucinations -This is a new problem and it has occurred on only 2 occasions and we will ask him to let us know if it continues to occur and will get a head scan and visit to the neurologist if the problem continues  7. Pulmonary emphysema, unspecified emphysema type -Continue follow-up with pulmonology  Patient Instructions                       Medicare Annual  Wellness Visit  [facility] and the medical providers at [facility] strive to bring you the best medical care.  In doing so we not only want to address your current medical conditions and concerns but also to detect new conditions early and prevent illness, disease and health-related problems.    Medicare offers a yearly Wellness Visit which allows our clinical staff to assess your need for preventative services including immunizations, lifestyle education, counseling to decrease risk of preventable diseases and screening for fall risk and other medical concerns.    This visit is provided free of charge (no copay) for all Medicare recipients. The clinical pharmacists at [facility] have begun to conduct these Wellness Visits which will also include a thorough review of all your medications.    As you primary medical provider recommend that you make an appointment for your Annual Wellness Visit if you have not done so already this year.  You may set up this appointment before  you leave today or you may call back ([phone]) and schedule an appointment.  Please make sure when you call that you mention that you are scheduling your Annual Wellness Visit with the clinical pharmacist so that the appointment may be made for the proper length of time.     Continue current medications. Continue good therapeutic lifestyle changes which include good diet and exercise. Fall precautions discussed with patient. If an FOBT was given today- please return it to our front desk. If you are over 78 years old - you may need Prevnar 33 or the adult Pneumonia vaccine.  Flu Shots are still available at our office. If you still haven't had one please call to set up a nurse visit to get one.   After your visit with [place] today you will receive a survey in the mail or online from [employer] regarding your care with [place]. Please take a moment to fill this out. Your feedback is very  important to [place] as you can help [place] better understand your patient needs as well as improve your experience and satisfaction. WE CARE ABOUT YOU!!!   Continue follow-up with pulmonologist as planned If you have any more visual hallucinations please get back in touch with [place] and we may arrange to get a CT scan or MRI of your head and schedule a visit possibly with the neurologist. Please tell the ophthalmologist about these visual hallucinations. Please use your inhalers regularly As mentioned during the visit, take Zantac 150 before dinner every night and take your omeprazole in the morning before breakfast and see if this helps her swallowing issues and your heartburn Moore.   Arrie Senate MD

## 2014-08-31 LAB — NMR, LIPOPROFILE
Cholesterol: 227 mg/dL — ABNORMAL HIGH (ref 100–199)
HDL Cholesterol by NMR: 55 mg/dL (ref >39)
HDL PARTICLE NUMBER: 29.4 umol/L — AB (ref >=30.5)
LDL Particle Number: 1726 nmol/L — ABNORMAL HIGH (ref <1000)
LDL Size: 20.5 nm (ref >20.5)
LDL-C: 147 mg/dL — ABNORMAL HIGH (ref 0–99)
LP-IR Score: 59 — ABNORMAL HIGH (ref <=45)
SMALL LDL PARTICLE NUMBER: 973 nmol/L — AB (ref <=527)
TRIGLYCERIDES BY NMR: 126 mg/dL (ref 0–149)

## 2014-08-31 LAB — HEPATIC FUNCTION PANEL
ALK PHOS: 36 IU/L — AB (ref 39–117)
ALT: 12 IU/L (ref 0–44)
AST: 16 IU/L (ref 0–40)
Albumin: 5 g/dL — ABNORMAL HIGH (ref 3.5–4.7)
Bilirubin Total: 0.4 mg/dL (ref 0.0–1.2)
Bilirubin, Direct: 0.13 mg/dL (ref 0.00–0.40)
Total Protein: 7.4 g/dL (ref 6.0–8.5)

## 2014-08-31 LAB — BMP8+EGFR
BUN/Creatinine Ratio: 15 (ref 10–22)
BUN: 16 mg/dL (ref 8–27)
CALCIUM: 9.6 mg/dL (ref 8.6–10.2)
CO2: 25 mmol/L (ref 18–29)
CREATININE: 1.04 mg/dL (ref 0.76–1.27)
Chloride: 100 mmol/L (ref 97–108)
GFR calc Af Amer: 75 mL/min/{1.73_m2} (ref >59)
GFR calc non Af Amer: 65 mL/min/{1.73_m2} (ref >59)
Glucose: 92 mg/dL (ref 65–99)
Potassium: 4.2 mmol/L (ref 3.5–5.2)
Sodium: 142 mmol/L (ref 134–144)

## 2014-08-31 LAB — VITAMIN D 25 HYDROXY (VIT D DEFICIENCY, FRACTURES): Vit D, 25-Hydroxy: 42 ng/mL (ref 30.0–100.0)

## 2014-09-05 ENCOUNTER — Other Ambulatory Visit: Payer: PPO

## 2014-09-05 DIAGNOSIS — Z1212 Encounter for screening for malignant neoplasm of rectum: Secondary | ICD-10-CM

## 2014-09-07 LAB — FECAL OCCULT BLOOD, IMMUNOCHEMICAL: FECAL OCCULT BLD: NEGATIVE

## 2014-09-14 ENCOUNTER — Encounter: Payer: Self-pay | Admitting: Family Medicine

## 2014-09-20 ENCOUNTER — Other Ambulatory Visit: Payer: Self-pay | Admitting: Family Medicine

## 2014-09-20 NOTE — Telephone Encounter (Signed)
Last seen and last VIt D 08/30/14 DW[facility]   42.0

## 2014-10-14 ENCOUNTER — Other Ambulatory Visit: Payer: Self-pay | Admitting: Family Medicine

## 2014-10-15 NOTE — Telephone Encounter (Signed)
Last filled 09/08/14, last seen 08/30/14. Call in at [facility]

## 2014-10-19 ENCOUNTER — Ambulatory Visit (INDEPENDENT_AMBULATORY_CARE_PROVIDER_SITE_OTHER): Payer: PPO | Admitting: Pharmacist

## 2014-10-19 ENCOUNTER — Ambulatory Visit: Payer: Self-pay

## 2014-10-19 VITALS — BP 142/62 | HR 60 | Ht 66.0 in | Wt 148.0 lb

## 2014-10-19 DIAGNOSIS — Z0000 Encounter for general adult medical examination without abnormal findings: Secondary | ICD-10-CM | POA: Diagnosis not present

## 2014-10-19 NOTE — Progress Notes (Signed)
Patient ID: Carlos Brooks, male   DOB: 1926-09-18, 81 y.o.   MRN: [id number]    Subjective:   Carlos Brooks is a 81 y.o. male who presents for an Initial Medicare Annual Wellness Visit.  [name] is a WM in NAD.  He lives in [place] , [place] with his wife.  He is retired from working in a [employer] but he has returned to work part time.   He has no health related concerns today.   Current Medications (verified) Outpatient Encounter Prescriptions as of 10/19/2014  Medication Sig  . albuterol (PROVENTIL) (2.5 MG/3ML) 0.083% nebulizer solution Take 3 mLs (2.5 mg total) by nebulization every 6 (six) hours as needed. Dx: 496  . aspirin 81 MG EC tablet Take 81 mg by mouth daily.    . calcium carbonate (OS-CAL) 600 MG TABS Take 600 mg by mouth 2 (two) times daily with a meal.    . finasteride (PROSCAR) 5 MG tablet TAKE 1 TABLET DAILY  . fluticasone (FLONASE) 50 MCG/ACT nasal spray Place 2 sprays into the nose daily.  Marland Kitchen guaiFENesin (MUCINEX) 600 MG 12 hr tablet Take 1,200 mg by mouth 2 (two) times daily.    Marland Kitchen LORazepam (ATIVAN) 0.5 MG tablet TAKE 1 TABLET BY MOUTH EVERY DAY AS NEEDED  . omeprazole (PRILOSEC) 40 MG capsule TAKE (1) CAPSULE TWICE DAILY.  Marland Kitchen terazosin (HYTRIN) 5 MG capsule TAKE ONE CAPSULE EVERY DAY  . Vitamin D, Ergocalciferol, (DRISDOL) 50000 UNITS CAPS capsule TAKE 1 CAPSULE (50,000 UNITS TOTAL) BY MOUTH EVERY 7 (SEVEN) DAYS.  Marland Kitchen Cholecalciferol (VITAMIN D3) 2000 UNITS TABS Take 1 tablet by mouth daily.     No facility-administered encounter medications on file as of 10/19/2014.    Allergies (verified) Clarithromycin; Crestor; Lipitor; Niaspan; Pneumovax; Pravachol; Ranitidine hcl; and Simvastatin   History: Past Medical History  Diagnosis Date  . COPD (chronic obstructive pulmonary disease)   . BPH (benign prostatic hyperplasia)   . Hyperlipidemia   . GERD (gastroesophageal reflux disease)   . Hiatal hernia   . Cataract    Past Surgical History  Procedure  Laterality Date  . Hernia repair    . Eye surgery     Family History  Problem Relation Age of Onset  . Cancer Mother     [name]  . Cancer Father     lung   Social History   Occupational History  . Not on file.   Social History Main Topics  . Smoking status: Former Smoker    Quit date: 04/13/1993  . Smokeless tobacco: Not on file  . Alcohol Use: No  . Drug Use: No  . Sexual Activity: Not on file    Do you feel safe at home?  Yes  Dietary issues and exercise activities discussed: Current Exercise Habits:: The patient has a physically strenous job, but has no regular exercise apart from work.  Current Dietary habits:  Tries to limit fat intake.  He has elevated  LDL but is intolerant of statins. No h/o ASCVD  Cardiac Risk Factors include: advanced age (>72men, >29 women);hypertension;male gender;sedentary lifestyle  Objective:    Today's Vitals   10/19/14 1515  BP: 142/62  Pulse: 60  Height: 5\' 6"  (1.676 m)  Weight: 148 lb (67.132 kg)  PainSc: 2   PainLoc: Ankle   Body mass index is 23.9 kg/(m^2).   Activities of Daily Living In your present state of health, do you have any difficulty performing the following activities: 10/19/2014  Hearing? Carlos Brooks  Vision? N  Difficulty concentrating or making decisions? N  Walking or climbing stairs? N  Dressing or bathing? N  Doing errands, shopping? N  Preparing Food and eating ? N  Using the Toilet? N  In the past six months, have you accidently leaked urine? N  Do you have problems with loss of bowel control? N  Managing your Medications? N  Managing your Finances? N  Housekeeping or managing your Housekeeping? N    Are there smokers in your home (other than you)? No    Depression Screen PHQ 2/9 Scores 10/19/2014 08/30/2014 06/05/2014 05/01/2014  PHQ - 2 Score 0 0 0 0    Fall Risk Fall Risk  10/19/2014 08/30/2014 06/05/2014 05/01/2014 04/17/2014  Falls in the past year? No No No No No    Cognitive Function: MMSE -  Mini Mental State Exam 10/19/2014  Orientation to time 5  Orientation to Place 5  Registration 3  Attention/ Calculation 4  Recall 3  Language- name 2 objects 2  Language- repeat 1  Language- follow 3 step command 3  Language- read & follow direction 1  Write a sentence 1  Copy design 1  Total score 29    Immunizations and Health Maintenance Immunization History  Administered Date(s) Administered  . Influenza Split 12/08/2010, 12/08/2011  . Influenza Whole 12/26/2008  . Influenza,inj,Quad PF,36+ Mos 12/26/2012  . Influenza,inj,quad, With Preservative 12/05/2013  . Pneumococcal Polysaccharide-23 03/09/1994  . Td 03/09/2005  . Zoster 01/08/2007   Health Maintenance Due  Topic Date Due  . INFLUENZA VACCINE  10/08/2014    Patient Care Team: Chipper Herb, MD as PCP - General (Family Medicine) Adelina Mings Margret Chance, MD as Consulting Physician (Optometry) Hayden Pedro, MD as Consulting Physician (Ophthalmology) Jarome Matin, MD as Consulting Physician (Dermatology) Lennon Alstrom, MD as Consulting Physician (Neurology)  Indicate any recent Medical Services you may have received from other than [facility] providers in the past year (date may be approximate).    Assessment:    Annual Wellness Visit  HTN  - patient's BP elevated but previous was at goal.   Screening Tests Health Maintenance  Topic Date Due  . INFLUENZA VACCINE  10/08/2014  . PNA vac Low Risk Adult (2 of 2 - PCV13) 08/29/2024 (Originally 03/10/1995)  . TETANUS/TDAP  03/10/2015  . COLONOSCOPY  02/06/2017  . ZOSTAVAX  Completed        Plan:   During the course of the visit [place] was educated and counseled about the following appropriate screening and preventive services:   Vaccines to include Pneumoccal, Influenza, Hepatitis B, Td, Zostavax - due Prevnar 13 but patient have contraindication due to h/o arm swelling with past pneumonia vaccine  Colorectal cancer screening - colonoscopy UTD; FOBT  UTD  Cardiovascular disease screening - last EKG 02/2012.  LDL elevated; BP slightly elevated today but last check was WNL. Recheck BP in 1-2 months.  Diabetes screening - UTD  Glaucoma screening / Eye Exam - UTD  Nutrition counseling - continue to limit high fat foods  Prostate cancer screening - last was 2011; get with next labs  Advanced Directives - information given  Exercise - continue with active lifestyle; consider adding walking to daily activities.    Patient Instructions (the written plan) were given to the patient.   Cherre Robins, [facility]   10/19/2014

## 2014-10-19 NOTE — Patient Instructions (Signed)
  [name] , Thank you for taking time to come for your Medicare Wellness Visit. I appreciate your ongoing commitment to your health goals. Please review the following plan we discussed and let me know if I can assist you in the future.    This is a list of the screening recommended for you and due dates:  Health Maintenance  Topic Date Due  . Flu Shot  Fall 2016  . Pneumonia vaccines (2 of 2 - PCV13) Unable to get due to past history of allergic reaction  . Tetanus Vaccine  03/10/2015  . Colon Cancer Screening  02/06/2017  . Shingles Vaccine  Completed  *Topic was postponed. The date shown is not the original due date.

## 2014-11-07 ENCOUNTER — Other Ambulatory Visit: Payer: Self-pay | Admitting: Family Medicine

## 2014-11-07 ENCOUNTER — Ambulatory Visit (INDEPENDENT_AMBULATORY_CARE_PROVIDER_SITE_OTHER): Payer: PPO | Admitting: Family Medicine

## 2014-11-07 ENCOUNTER — Encounter: Payer: Self-pay | Admitting: Family Medicine

## 2014-11-07 ENCOUNTER — Ambulatory Visit (INDEPENDENT_AMBULATORY_CARE_PROVIDER_SITE_OTHER): Payer: PPO

## 2014-11-07 VITALS — BP 143/79 | HR 81 | Temp 97.0°F | Ht 66.0 in | Wt 145.0 lb

## 2014-11-07 DIAGNOSIS — S60940A Unspecified superficial injury of right index finger, initial encounter: Secondary | ICD-10-CM | POA: Diagnosis not present

## 2014-11-07 DIAGNOSIS — T148XXA Other injury of unspecified body region, initial encounter: Secondary | ICD-10-CM

## 2014-11-07 DIAGNOSIS — S61220A Laceration with foreign body of right index finger without damage to nail, initial encounter: Secondary | ICD-10-CM | POA: Diagnosis not present

## 2014-11-07 DIAGNOSIS — Z23 Encounter for immunization: Secondary | ICD-10-CM | POA: Diagnosis not present

## 2014-11-07 MED ORDER — CEPHALEXIN 500 MG PO CAPS: 500.0000 mg | ORAL_CAPSULE | Freq: Two times a day (BID) | ORAL | Status: DC

## 2014-11-07 NOTE — Progress Notes (Signed)
Subjective:    Patient ID: Carlos Brooks, male    DOB: August 08, 1926, 81 y.o.   MRN: [id number]  HPI  81 year old male comes in today with a large splinter in his right index finger. He did not try to remove it.   Patient Active Problem List   Diagnosis Date Noted  . DYSPHAGIA UNSPECIFIED 06/20/2007  . ALLERGIC RHINITIS 04/28/2007  . LUNG NODULE 04/28/2007  . HYPERLIPIDEMIA 03/28/2007  . COPD 03/28/2007  . GERD 03/28/2007  . BENIGN PROSTATIC HYPERTROPHY, HX OF 03/28/2007   Outpatient Encounter Prescriptions as of 11/07/2014  Medication Sig  . albuterol (PROVENTIL) (2.5 MG/3ML) 0.083% nebulizer solution Take 3 mLs (2.5 mg total) by nebulization every 6 (six) hours as needed. Dx: 496  . aspirin 81 MG EC tablet Take 81 mg by mouth daily.    . calcium carbonate (OS-CAL) 600 MG TABS Take 600 mg by mouth 2 (two) times daily with a meal.    . Cholecalciferol (VITAMIN D3) 2000 UNITS TABS Take 1 tablet by mouth daily.    . finasteride (PROSCAR) 5 MG tablet TAKE 1 TABLET DAILY  . fluticasone (FLONASE) 50 MCG/ACT nasal spray Place 2 sprays into the nose daily.  Marland Kitchen guaiFENesin (MUCINEX) 600 MG 12 hr tablet Take 1,200 mg by mouth 2 (two) times daily.    Marland Kitchen LORazepam (ATIVAN) 0.5 MG tablet TAKE 1 TABLET BY MOUTH EVERY DAY AS NEEDED  . omeprazole (PRILOSEC) 40 MG capsule TAKE (1) CAPSULE TWICE DAILY.  Marland Kitchen terazosin (HYTRIN) 5 MG capsule TAKE ONE CAPSULE EVERY DAY  . Vitamin D, Ergocalciferol, (DRISDOL) 50000 UNITS CAPS capsule TAKE 1 CAPSULE (50,000 UNITS TOTAL) BY MOUTH EVERY 7 (SEVEN) DAYS.   No facility-administered encounter medications on file as of 11/07/2014.       Review of Systems  Constitutional: Negative.   HENT: Negative.   Eyes: Negative.   Respiratory: Negative.   Cardiovascular: Negative.   Gastrointestinal: Negative.   Endocrine: Negative.   Genitourinary: Negative.   Musculoskeletal: Negative.   Skin: Positive for wound (large splinter right index finger).    Allergic/Immunologic: Negative.   Neurological: Negative.   Hematological: Negative.   Psychiatric/Behavioral: Negative.        Objective:   Physical Exam  BP 143/79 mmHg  Pulse 81  Temp(Src) 97 F (36.1 C) (Oral)  Ht 5\' 6"  (1.676 m)  Wt 145 lb (65.772 kg)  BMI 23.41 kg/m2   [facility] reading (PRIMARY) by  Dr. Dimitri Ped i.e. splinter thru right index finger                                Xylocaine was infiltrated into finger and a small incision was made in order to grasp the splinter that was through the finger. The splinter was successfully removed. There was some bleeding following and one suture was inserted to stop the bleeding. A pressure dressing was applied. The patient tolerated the procedure well. He had movement of the finger other than with the dressing that was applied. The fingertip was somewhat numb but Xylocaine had been infiltrated.     Assessment & Plan:  1. Splinter -The splinter was removed after soaking the finger in Betadine solution following anesthesia with locally infiltrated Xylocaine. -A pressure dressing was applied - DG Finger Index Right; Future  Meds ordered this encounter  Medications  . cephALEXin (KEFLEX) 500 MG capsule    Sig: Take 1 capsule (500 mg total)  by mouth 2 (two) times daily.    Dispense:  20 capsule    Refill:  0    Patient Instructions  Keep dressing applied to wound and return to clinic in 2 days for recheck Take antibiotic as directed and until completed   Arrie Senate MD

## 2014-11-07 NOTE — Patient Instructions (Signed)
Keep dressing applied to wound and return to clinic in 2 days for recheck Take antibiotic as directed and until completed

## 2014-11-09 ENCOUNTER — Ambulatory Visit: Payer: PPO | Admitting: Family Medicine

## 2014-11-14 ENCOUNTER — Ambulatory Visit (INDEPENDENT_AMBULATORY_CARE_PROVIDER_SITE_OTHER): Payer: PPO | Admitting: Family Medicine

## 2014-11-14 ENCOUNTER — Encounter: Payer: Self-pay | Admitting: Family Medicine

## 2014-11-14 VITALS — BP 137/69 | HR 56 | Temp 97.1°F | Ht 66.0 in | Wt 147.0 lb

## 2014-11-14 DIAGNOSIS — K219 Gastro-esophageal reflux disease without esophagitis: Secondary | ICD-10-CM

## 2014-11-14 DIAGNOSIS — T148 Other injury of unspecified body region: Secondary | ICD-10-CM

## 2014-11-14 DIAGNOSIS — I700 Atherosclerosis of aorta: Secondary | ICD-10-CM

## 2014-11-14 DIAGNOSIS — E559 Vitamin D deficiency, unspecified: Secondary | ICD-10-CM

## 2014-11-14 DIAGNOSIS — T148XXA Other injury of unspecified body region, initial encounter: Secondary | ICD-10-CM

## 2014-11-14 DIAGNOSIS — E785 Hyperlipidemia, unspecified: Secondary | ICD-10-CM

## 2014-11-14 NOTE — Progress Notes (Signed)
Subjective:    Patient ID: Carlos Brooks, male    DOB: December 18, 1926, 81 y.o.   MRN: [id number]  HPI Patient here today for suture removal of finger. Patient continues to take the antibiotic and the finger is feeling better though not completely healed.      Patient Active Problem List   Diagnosis Date Noted  . DYSPHAGIA UNSPECIFIED 06/20/2007  . ALLERGIC RHINITIS 04/28/2007  . LUNG NODULE 04/28/2007  . HYPERLIPIDEMIA 03/28/2007  . COPD 03/28/2007  . GERD 03/28/2007  . BENIGN PROSTATIC HYPERTROPHY, HX OF 03/28/2007   Outpatient Encounter Prescriptions as of 11/14/2014  Medication Sig  . albuterol (PROVENTIL) (2.5 MG/3ML) 0.083% nebulizer solution Take 3 mLs (2.5 mg total) by nebulization every 6 (six) hours as needed. Dx: 496  . aspirin 81 MG EC tablet Take 81 mg by mouth daily.    . calcium carbonate (OS-CAL) 600 MG TABS Take 600 mg by mouth 2 (two) times daily with a meal.    . cephALEXin (KEFLEX) 500 MG capsule Take 1 capsule (500 mg total) by mouth 2 (two) times daily.  . Cholecalciferol (VITAMIN D3) 2000 UNITS TABS Take 1 tablet by mouth daily.    . finasteride (PROSCAR) 5 MG tablet TAKE 1 TABLET DAILY  . fluticasone (FLONASE) 50 MCG/ACT nasal spray Place 2 sprays into the nose daily.  Marland Kitchen guaiFENesin (MUCINEX) 600 MG 12 hr tablet Take 1,200 mg by mouth 2 (two) times daily.    Marland Kitchen LORazepam (ATIVAN) 0.5 MG tablet TAKE 1 TABLET BY MOUTH EVERY DAY AS NEEDED  . omeprazole (PRILOSEC) 40 MG capsule TAKE (1) CAPSULE TWICE DAILY.  Marland Kitchen terazosin (HYTRIN) 5 MG capsule TAKE ONE CAPSULE EVERY DAY  . Vitamin D, Ergocalciferol, (DRISDOL) 50000 UNITS CAPS capsule TAKE 1 CAPSULE (50,000 UNITS TOTAL) BY MOUTH EVERY 7 (SEVEN) DAYS.   No facility-administered encounter medications on file as of 11/14/2014.      Review of Systems  Constitutional: Negative.   HENT: Negative.   Eyes: Negative.   Respiratory: Negative.   Cardiovascular: Negative.   Gastrointestinal: Negative.   Endocrine:  Negative.   Genitourinary: Negative.   Musculoskeletal: Negative.   Skin: Negative.   Allergic/Immunologic: Negative.   Neurological: Negative.   Hematological: Negative.   Psychiatric/Behavioral: Negative.        Objective:   Physical Exam BP 137/69 mmHg  Pulse 56  Temp(Src) 97.1 F (36.2 C) (Oral)  Ht 5\' 6"  (1.676 m)  Wt 147 lb (66.679 kg)  BMI 23.74 kg/m2  The single suture was removed without problems. The patient still has some distal numbness and tingling in the finger. There is minimal swelling and there is no erythema or drainage from the wound.      Assessment & Plan:  1. Gastroesophageal reflux disease, esophagitis presence not specified  2. Vitamin D deficiency  3. Hyperlipidemia  4. Thoracic aorta atherosclerosis  5. Splinter -The suture was removed from the finger today and the finger pills to be healing well other than some residual swelling. There is no redness erythema or drainage and the patient will continue and complete antibiotic that was started. He will call us back in about a week and let us know how things are going.  Patient Instructions                       Medicare Annual Wellness Visit  [facility] and the medical providers at [facility] strive to bring you the best medical  care.  In doing so we not only want to address your current medical conditions and concerns but also to detect new conditions early and prevent illness, disease and health-related problems.    Medicare offers a yearly Wellness Visit which allows our clinical staff to assess your need for preventative services including immunizations, lifestyle education, counseling to decrease risk of preventable diseases and screening for fall risk and other medical concerns.    This visit is provided free of charge (no copay) for all Medicare recipients. The clinical pharmacists at [facility] have begun to conduct these Wellness Visits which  will also include a thorough review of all your medications.    As you primary medical provider recommend that you make an appointment for your Annual Wellness Visit if you have not done so already this year.  You may set up this appointment before you leave today or you may call back [phone]) and schedule an appointment.  Please make sure when you call that you mention that you are scheduling your Annual Wellness Visit with the clinical pharmacist so that the appointment may be made for the proper length of time.      Continue current medications. Continue good therapeutic lifestyle changes which include good diet and exercise. Fall precautions discussed with patient. If an FOBT was given today- please return it to our front desk. If you are over 72 years old - you may need Prevnar 81 or the adult Pneumonia vaccine.  **Flu shots will be available soon--- please call and schedule a FLU-CLINIC appointment**  After your visit with [place] today you will receive a survey in the mail or online from [employer] regarding your care with [place]. Please take a moment to fill this out. Your feedback is very important to [place] as you can help [place] better understand your patient needs as well as improve your experience and satisfaction. WE CARE ABOUT YOU!!!   **Please join [place] [email or web address], 2016 from 5:00 to 7:00pm for our OPEN HOUSE! Come out and meet our NEW providers** The patient should continue and complete the antibiotic that was started    Arrie Senate MD

## 2014-11-14 NOTE — Patient Instructions (Addendum)
Medicare Annual Wellness Visit  [facility] and the medical providers at [facility] strive to bring you the best medical care.  In doing so we not only want to address your current medical conditions and concerns but also to detect new conditions early and prevent illness, disease and health-related problems.    Medicare offers a yearly Wellness Visit which allows our clinical staff to assess your need for preventative services including immunizations, lifestyle education, counseling to decrease risk of preventable diseases and screening for fall risk and other medical concerns.    This visit is provided free of charge (no copay) for all Medicare recipients. The clinical pharmacists at [facility] have begun to conduct these Wellness Visits which will also include a thorough review of all your medications.    As you primary medical provider recommend that you make an appointment for your Annual Wellness Visit if you have not done so already this year.  You may set up this appointment before you leave today or you may call back [phone]) and schedule an appointment.  Please make sure when you call that you mention that you are scheduling your Annual Wellness Visit with the clinical pharmacist so that the appointment may be made for the proper length of time.      Continue current medications. Continue good therapeutic lifestyle changes which include good diet and exercise. Fall precautions discussed with patient. If an FOBT was given today- please return it to our front desk. If you are over 58 years old - you may need Prevnar 38 or the adult Pneumonia vaccine.  **Flu shots will be available soon--- please call and schedule a FLU-CLINIC appointment**  After your visit with [place] today you will receive a survey in the mail or online from [employer] regarding your care with [place]. Please take a moment to fill this out. Your feedback is  very important to [place] as you can help [place] better understand your patient needs as well as improve your experience and satisfaction. WE CARE ABOUT YOU!!!   **Please join [place] [email or web address], 2016 from 5:00 to 7:00pm for our OPEN HOUSE! Come out and meet our NEW providers** The patient should continue and complete the antibiotic that was started

## 2014-11-19 ENCOUNTER — Other Ambulatory Visit: Payer: Self-pay | Admitting: Family Medicine

## 2014-12-10 ENCOUNTER — Other Ambulatory Visit: Payer: Self-pay | Admitting: Family Medicine

## 2014-12-10 NOTE — Telephone Encounter (Signed)
Last seen 11/14/14  Dr Laurance Flatten  If approved route to nurse to [facility] into [facility]

## 2014-12-21 ENCOUNTER — Ambulatory Visit (INDEPENDENT_AMBULATORY_CARE_PROVIDER_SITE_OTHER): Payer: PPO

## 2014-12-21 DIAGNOSIS — Z23 Encounter for immunization: Secondary | ICD-10-CM

## 2014-12-27 ENCOUNTER — Other Ambulatory Visit: Payer: Self-pay | Admitting: Family Medicine

## 2014-12-28 NOTE — Telephone Encounter (Signed)
Vitamin D level was 42 in June, 2016

## 2015-01-15 ENCOUNTER — Ambulatory Visit (INDEPENDENT_AMBULATORY_CARE_PROVIDER_SITE_OTHER): Payer: PPO | Admitting: Family Medicine

## 2015-01-15 ENCOUNTER — Encounter: Payer: Self-pay | Admitting: Family Medicine

## 2015-01-15 VITALS — BP 138/65 | HR 53 | Temp 96.1°F | Ht 66.0 in | Wt 144.0 lb

## 2015-01-15 DIAGNOSIS — J439 Emphysema, unspecified: Secondary | ICD-10-CM

## 2015-01-15 DIAGNOSIS — G629 Polyneuropathy, unspecified: Secondary | ICD-10-CM

## 2015-01-15 DIAGNOSIS — N400 Enlarged prostate without lower urinary tract symptoms: Secondary | ICD-10-CM | POA: Diagnosis not present

## 2015-01-15 DIAGNOSIS — E559 Vitamin D deficiency, unspecified: Secondary | ICD-10-CM

## 2015-01-15 DIAGNOSIS — E785 Hyperlipidemia, unspecified: Secondary | ICD-10-CM

## 2015-01-15 DIAGNOSIS — K219 Gastro-esophageal reflux disease without esophagitis: Secondary | ICD-10-CM | POA: Diagnosis not present

## 2015-01-15 DIAGNOSIS — I700 Atherosclerosis of aorta: Secondary | ICD-10-CM

## 2015-01-15 LAB — POCT URINALYSIS DIPSTICK
Bilirubin, UA: NEGATIVE
GLUCOSE UA: NEGATIVE
Ketones, UA: NEGATIVE
LEUKOCYTES UA: NEGATIVE
NITRITE UA: NEGATIVE
PROTEIN UA: NEGATIVE
SPEC GRAV UA: 1.015
UROBILINOGEN UA: NEGATIVE
pH, UA: 5

## 2015-01-15 LAB — POCT UA - MICROSCOPIC ONLY
CRYSTALS, UR, HPF, POC: NEGATIVE
Casts, Ur, LPF, POC: NEGATIVE
Mucus, UA: NEGATIVE
Yeast, UA: NEGATIVE

## 2015-01-15 NOTE — Patient Instructions (Addendum)
Medicare Annual Wellness Visit  [facility] and the medical providers at [facility] strive to bring you the best medical care.  In doing so we not only want to address your current medical conditions and concerns but also to detect new conditions early and prevent illness, disease and health-related problems.    Medicare offers a yearly Wellness Visit which allows our clinical staff to assess your need for preventative services including immunizations, lifestyle education, counseling to decrease risk of preventable diseases and screening for fall risk and other medical concerns.    This visit is provided free of charge (no copay) for all Medicare recipients. The clinical pharmacists at [facility] have begun to conduct these Wellness Visits which will also include a thorough review of all your medications.    As you primary medical provider recommend that you make an appointment for your Annual Wellness Visit if you have not done so already this year.  You may set up this appointment before you leave today or you may call back [phone]) and schedule an appointment.  Please make sure when you call that you mention that you are scheduling your Annual Wellness Visit with the clinical pharmacist so that the appointment may be made for the proper length of time.     Continue current medications. Continue good therapeutic lifestyle changes which include good diet and exercise. Fall precautions discussed with patient. If an FOBT was given today- please return it to our front desk. If you are over 8 years old - you may need Prevnar 36 or the adult Pneumonia vaccine.  **Flu shots are available--- please call and schedule a FLU-CLINIC appointment**  After your visit with [place] today you will receive a survey in the mail or online from [employer] regarding your care with [place]. Please take a moment to fill this out. Your feedback is very  important to [place] as you can help [place] better understand your patient needs as well as improve your experience and satisfaction. WE CARE ABOUT YOU!!!   The patient will call back and make arrangements to see the orthopedic surgeon for another injection in his neck He should continue to be careful and avoid heavy lifting pushing and pulling because of the disc problems that he has in his neck He should continue to follow-up as needed with his pulmonologist This winter he should drink plenty of fluids and stay well hydrated We will call with the lab work results as soon as that becomes available

## 2015-01-15 NOTE — Progress Notes (Signed)
Subjective:    Patient ID: Carlos Brooks, male    DOB: 12/09/1926, 81 y.o.   MRN: [id number]  HPI Pt here for follow up and management of chronic medical problems which includes hyperlipidemia. He is taking medications regularly. The patient continues to have problems with his neck. He had an MRI of this over year ago and it shows cervical disc disease especially at C5 and 6 with a bulging disc. He has seen the orthopedic surgeon and has had injections. He is having more trouble with his neck now. It is especially is bad at nighttime when he was sleeping and when he turns his head to one side or the other. He denies any chest pain or any more shortness of breath than usual. He does have problems with indigestion at times and he takes omeprazole. He has not seen any blood in the stool or had any black tarry bowel movements. He does have problems with passing his water and it is slow at times but not always.      Patient Active Problem List   Diagnosis Date Noted  . DYSPHAGIA UNSPECIFIED 06/20/2007  . ALLERGIC RHINITIS 04/28/2007  . LUNG NODULE 04/28/2007  . HYPERLIPIDEMIA 03/28/2007  . COPD 03/28/2007  . GERD 03/28/2007  . BENIGN PROSTATIC HYPERTROPHY, HX OF 03/28/2007   Outpatient Encounter Prescriptions as of 01/15/2015  Medication Sig  . albuterol (PROVENTIL) (2.5 MG/3ML) 0.083% nebulizer solution Take 3 mLs (2.5 mg total) by nebulization every 6 (six) hours as needed. Dx: 496  . aspirin 81 MG EC tablet Take 81 mg by mouth daily.    . calcium carbonate (OS-CAL) 600 MG TABS Take 600 mg by mouth 2 (two) times daily with a meal.    . finasteride (PROSCAR) 5 MG tablet TAKE 1 TABLET DAILY  . guaiFENesin (MUCINEX) 600 MG 12 hr tablet Take 1,200 mg by mouth 2 (two) times daily.    Marland Kitchen LORazepam (ATIVAN) 0.5 MG tablet TAKE 1 TABLET EVERY DAY AS NEEDED  . terazosin (HYTRIN) 5 MG capsule TAKE ONE CAPSULE EVERY DAY  . Vitamin D, Ergocalciferol, (DRISDOL) 50000 UNITS CAPS capsule TAKE ONE  CAPSULE BY MOUTH EVERY 7 DAYS  . [DISCONTINUED] Cholecalciferol (VITAMIN D3) 2000 UNITS TABS Take 1 tablet by mouth daily.    . fluticasone (FLONASE) 50 MCG/ACT nasal spray Place 2 sprays into the nose daily.  Marland Kitchen omeprazole (PRILOSEC) 40 MG capsule TAKE (1) CAPSULE TWICE DAILY. (Patient not taking: Reported on 01/15/2015)  . [DISCONTINUED] cephALEXin (KEFLEX) 500 MG capsule Take 1 capsule (500 mg total) by mouth 2 (two) times daily.   No facility-administered encounter medications on file as of 01/15/2015.     Review of Systems  Constitutional: Negative.   HENT: Negative.   Eyes: Negative.   Respiratory: Negative.   Cardiovascular: Negative.   Gastrointestinal: Negative.   Endocrine: Negative.   Genitourinary: Negative.   Musculoskeletal: Positive for neck pain.  Skin: Negative.   Allergic/Immunologic: Negative.   Neurological: Negative.   Hematological: Negative.   Psychiatric/Behavioral: Negative.        Objective:   Physical Exam  Constitutional: He is oriented to person, place, and time. He appears well-developed and well-nourished. No distress.  The patient is alert and pleasant and much younger looking than his stated age.  HENT:  Head: Normocephalic and atraumatic.  Right Ear: External ear normal.  Left Ear: External ear normal.  Nose: Nose normal.  Mouth/Throat: Oropharynx is clear and moist. No oropharyngeal exudate.  Eyes: Conjunctivae and  EOM are normal. Pupils are equal, round, and reactive to light. Right eye exhibits no discharge. Left eye exhibits no discharge. No scleral icterus.  Neck: Normal range of motion. Neck supple. No thyromegaly present.  Cardiovascular: Normal rate, regular rhythm, normal heart sounds and intact distal pulses.  Exam reveals no gallop and no friction rub.   No murmur heard. The rhythm is regular at 60/m  Pulmonary/Chest: Effort normal and breath sounds normal. No respiratory distress. He has no wheezes. He has no rales. He exhibits no  tenderness.  There are a few expiratory wheezes but minimally compared to the past. There is no axillary adenopathy. There is no chest wall masses.  Abdominal: Soft. Bowel sounds are normal. He exhibits no mass. There is tenderness. There is no rebound and no guarding.  There is some generalized abdominal tenderness without organ enlargement or bruits.  Genitourinary: Rectum normal and penis normal.  The prostate is enlarged but smooth. There is no rectal masses. The external genitalia were within normal limits and there was no inguinal hernia palpable. There are no inguinal nodes.  Musculoskeletal: He exhibits no edema or tenderness.  There is limited range of motion at the neck without pain with turning the head from one side to the other.  Lymphadenopathy:    He has no cervical adenopathy.  Neurological: He is alert and oriented to person, place, and time. He has normal reflexes. No cranial nerve deficit.  Skin: Skin is warm and dry. No rash noted.  Psychiatric: He has a normal mood and affect. His behavior is normal. Judgment and thought content normal.  Nursing note and vitals reviewed.  BP 138/65 mmHg  Pulse 53  Temp(Src) 96.1 F (35.6 C) (Oral)  Ht 5' 6"  (1.676 m)  Wt 144 lb (65.318 kg)  BMI 23.25 kg/m2        Assessment & Plan:  1. Gastroesophageal reflux disease, esophagitis presence not specified -The patient should continue with his omeprazole or Zantac as needed - CBC with Differential/Platelet - Hepatic function panel  2. Vitamin D deficiency -Continue with vitamin D replacement. The results of lab work - CBC with Differential/Platelet - Vit D  25 hydroxy (rtn osteoporosis monitoring)  3. Hyperlipidemia -Patient is statin intolerant. He will continue with as aggressive therapeutic lifestyle changes as possible - BMP8+EGFR - CBC with Differential/Platelet - Hepatic function panel - NMR, lipoprofile  4. Thoracic aorta atherosclerosis ([place]) -Continue with low  cholesterol diet and aggressive therapeutic lifestyle changes - CBC with Differential/Platelet  5. BPH (benign prostatic hyperplasia) -The patient says his voiding is slow at times but good at times. He will continue with the finasteride he is taking. - POCT UA - Microscopic Only - POCT urinalysis dipstick  6. Pulmonary emphysema, unspecified emphysema type ([place]) -This seems to be stable at present. He sees the pulmonologist as needed.  7. Neuropathy ([place]) -He has the neuropathy in both arms from his disc disease in his neck. He will follow-up with orthopedic surgeon for another injection in his neck.  Patient Instructions                       Medicare Annual Wellness Visit  [facility] and the medical providers at [facility] strive to bring you the best medical care.  In doing so we not only want to address your current medical conditions and concerns but also to detect new conditions early and prevent illness, disease and health-related problems.  Medicare offers a yearly Wellness Visit which allows our clinical staff to assess your need for preventative services including immunizations, lifestyle education, counseling to decrease risk of preventable diseases and screening for fall risk and other medical concerns.    This visit is provided free of charge (no copay) for all Medicare recipients. The clinical pharmacists at [facility] have begun to conduct these Wellness Visits which will also include a thorough review of all your medications.    As you primary medical provider recommend that you make an appointment for your Annual Wellness Visit if you have not done so already this year.  You may set up this appointment before you leave today or you may call back ([phone]) and schedule an appointment.  Please make sure when you call that you mention that you are scheduling your Annual Wellness Visit with the clinical pharmacist so that the  appointment may be made for the proper length of time.     Continue current medications. Continue good therapeutic lifestyle changes which include good diet and exercise. Fall precautions discussed with patient. If an FOBT was given today- please return it to our front desk. If you are over 42 years old - you may need Prevnar 63 or the adult Pneumonia vaccine.  **Flu shots are available--- please call and schedule a FLU-CLINIC appointment**  After your visit with [place] today you will receive a survey in the mail or online from [employer] regarding your care with [place]. Please take a moment to fill this out. Your feedback is very important to [place] as you can help [place] better understand your patient needs as well as improve your experience and satisfaction. WE CARE ABOUT YOU!!!   The patient will call back and make arrangements to see the orthopedic surgeon for another injection in his neck He should continue to be careful and avoid heavy lifting pushing and pulling because of the disc problems that he has in his neck He should continue to follow-up as needed with his pulmonologist This winter he should drink plenty of fluids and stay well hydrated We will call with the lab work results as soon as that becomes available   Arrie Senate MD

## 2015-01-16 LAB — CBC WITH DIFFERENTIAL/PLATELET
Basophils Absolute: 0.1 10*3/uL (ref 0.0–0.2)
Basos: 1 %
EOS (ABSOLUTE): 0.9 10*3/uL — ABNORMAL HIGH (ref 0.0–0.4)
EOS: 11 %
HEMATOCRIT: 42.2 % (ref 37.5–51.0)
HEMOGLOBIN: 14.1 g/dL (ref 12.6–17.7)
IMMATURE GRANS (ABS): 0 10*3/uL (ref 0.0–0.1)
IMMATURE GRANULOCYTES: 0 %
Lymphocytes Absolute: 2.7 10*3/uL (ref 0.7–3.1)
Lymphs: 35 %
MCH: 30.5 pg (ref 26.6–33.0)
MCHC: 33.4 g/dL (ref 31.5–35.7)
MCV: 91 fL (ref 79–97)
MONOCYTES: 10 %
Monocytes Absolute: 0.8 10*3/uL (ref 0.1–0.9)
NEUTROS PCT: 43 %
Neutrophils Absolute: 3.3 10*3/uL (ref 1.4–7.0)
Platelets: 195 10*3/uL (ref 150–379)
RBC: 4.62 x10E6/uL (ref 4.14–5.80)
RDW: 13.7 % (ref 12.3–15.4)
WBC: 7.8 10*3/uL (ref 3.4–10.8)

## 2015-01-16 LAB — NMR, LIPOPROFILE
CHOLESTEROL: 214 mg/dL — AB (ref 100–199)
HDL Cholesterol by NMR: 47 mg/dL (ref >39)
HDL Particle Number: 29.8 umol/L — ABNORMAL LOW (ref >=30.5)
LDL Particle Number: 1350 nmol/L — ABNORMAL HIGH (ref <1000)
LDL Size: 20.6 nm (ref >20.5)
LDL-C: 138 mg/dL — AB (ref 0–99)
LP-IR Score: 56 — ABNORMAL HIGH (ref <=45)
Small LDL Particle Number: 581 nmol/L — ABNORMAL HIGH (ref <=527)
TRIGLYCERIDES BY NMR: 144 mg/dL (ref 0–149)

## 2015-01-16 LAB — BMP8+EGFR
BUN/Creatinine Ratio: 18 (ref 10–22)
BUN: 17 mg/dL (ref 8–27)
CO2: 25 mmol/L (ref 18–29)
CREATININE: 0.97 mg/dL (ref 0.76–1.27)
Calcium: 10 mg/dL (ref 8.6–10.2)
Chloride: 99 mmol/L (ref 97–106)
GFR calc Af Amer: 81 mL/min/{1.73_m2} (ref >59)
GFR calc non Af Amer: 70 mL/min/{1.73_m2} (ref >59)
GLUCOSE: 91 mg/dL (ref 65–99)
Potassium: 4.4 mmol/L (ref 3.5–5.2)
Sodium: 142 mmol/L (ref 136–144)

## 2015-01-16 LAB — VITAMIN D 25 HYDROXY (VIT D DEFICIENCY, FRACTURES): Vit D, 25-Hydroxy: 46.8 ng/mL (ref 30.0–100.0)

## 2015-01-16 LAB — HEPATIC FUNCTION PANEL
ALBUMIN: 4.7 g/dL (ref 3.5–4.7)
ALK PHOS: 41 IU/L (ref 39–117)
ALT: 15 IU/L (ref 0–44)
AST: 22 IU/L (ref 0–40)
BILIRUBIN TOTAL: 0.5 mg/dL (ref 0.0–1.2)
Bilirubin, Direct: 0.13 mg/dL (ref 0.00–0.40)
Total Protein: 7.1 g/dL (ref 6.0–8.5)

## 2015-02-04 ENCOUNTER — Other Ambulatory Visit: Payer: Self-pay | Admitting: Family Medicine

## 2015-02-04 NOTE — Telephone Encounter (Signed)
Last seen 11/14/14  DWM  If approved route [facility]

## 2015-04-05 DIAGNOSIS — L820 Inflamed seborrheic keratosis: Secondary | ICD-10-CM | POA: Diagnosis not present

## 2015-04-05 DIAGNOSIS — D485 Neoplasm of uncertain behavior of skin: Secondary | ICD-10-CM | POA: Diagnosis not present

## 2015-04-05 DIAGNOSIS — C44629 Squamous cell carcinoma of skin of left upper limb, including shoulder: Secondary | ICD-10-CM | POA: Diagnosis not present

## 2015-04-05 DIAGNOSIS — L821 Other seborrheic keratosis: Secondary | ICD-10-CM | POA: Diagnosis not present

## 2015-04-05 DIAGNOSIS — D1801 Hemangioma of skin and subcutaneous tissue: Secondary | ICD-10-CM | POA: Diagnosis not present

## 2015-04-05 DIAGNOSIS — D692 Other nonthrombocytopenic purpura: Secondary | ICD-10-CM | POA: Diagnosis not present

## 2015-04-05 DIAGNOSIS — M7130 Other bursal cyst, unspecified site: Secondary | ICD-10-CM | POA: Diagnosis not present

## 2015-04-08 ENCOUNTER — Other Ambulatory Visit: Payer: Self-pay | Admitting: Family Medicine

## 2015-04-08 NOTE — Telephone Encounter (Signed)
Last filled 03/07/15, last seen 01/15/15. [facility]

## 2015-04-11 ENCOUNTER — Encounter: Payer: Self-pay | Admitting: *Deleted

## 2015-04-18 ENCOUNTER — Encounter: Payer: Self-pay | Admitting: Family Medicine

## 2015-05-06 ENCOUNTER — Other Ambulatory Visit: Payer: Self-pay | Admitting: Family Medicine

## 2015-06-04 ENCOUNTER — Other Ambulatory Visit: Payer: Self-pay | Admitting: Family Medicine

## 2015-06-04 ENCOUNTER — Ambulatory Visit: Payer: Self-pay | Admitting: Family Medicine

## 2015-06-05 NOTE — Telephone Encounter (Signed)
Last seen 01/15/15 DWM  Last VIT D 01/15/15  46.8  If approved route to [facility]

## 2015-06-06 ENCOUNTER — Encounter: Payer: Self-pay | Admitting: Family Medicine

## 2015-06-06 ENCOUNTER — Ambulatory Visit (INDEPENDENT_AMBULATORY_CARE_PROVIDER_SITE_OTHER): Payer: PPO | Admitting: Family Medicine

## 2015-06-06 ENCOUNTER — Telehealth: Payer: Self-pay | Admitting: *Deleted

## 2015-06-06 VITALS — BP 131/69 | HR 70 | Temp 96.6°F | Ht 66.0 in | Wt 145.0 lb

## 2015-06-06 DIAGNOSIS — E785 Hyperlipidemia, unspecified: Secondary | ICD-10-CM | POA: Diagnosis not present

## 2015-06-06 DIAGNOSIS — J449 Chronic obstructive pulmonary disease, unspecified: Secondary | ICD-10-CM

## 2015-06-06 DIAGNOSIS — J301 Allergic rhinitis due to pollen: Secondary | ICD-10-CM

## 2015-06-06 DIAGNOSIS — K219 Gastro-esophageal reflux disease without esophagitis: Secondary | ICD-10-CM

## 2015-06-06 DIAGNOSIS — I700 Atherosclerosis of aorta: Secondary | ICD-10-CM

## 2015-06-06 DIAGNOSIS — E559 Vitamin D deficiency, unspecified: Secondary | ICD-10-CM | POA: Diagnosis not present

## 2015-06-06 DIAGNOSIS — N400 Enlarged prostate without lower urinary tract symptoms: Secondary | ICD-10-CM

## 2015-06-06 DIAGNOSIS — J40 Bronchitis, not specified as acute or chronic: Secondary | ICD-10-CM | POA: Diagnosis not present

## 2015-06-06 LAB — MICROSCOPIC EXAMINATION
BACTERIA UA: NONE SEEN
EPITHELIAL CELLS (NON RENAL): NONE SEEN /HPF (ref 0–10)
WBC UA: NONE SEEN /HPF (ref 0–?)

## 2015-06-06 LAB — URINALYSIS, COMPLETE
Bilirubin, UA: NEGATIVE
Glucose, UA: NEGATIVE
Ketones, UA: NEGATIVE
Leukocytes, UA: NEGATIVE
NITRITE UA: NEGATIVE
PH UA: 5.5 (ref 5.0–7.5)
Protein, UA: NEGATIVE
Specific Gravity, UA: 1.025 (ref 1.005–1.030)
UUROB: 0.2 mg/dL (ref 0.2–1.0)

## 2015-06-06 NOTE — Progress Notes (Signed)
 Subjective:    Patient ID: Carlos Brooks, male    DOB: 03/07/1927, 82 y.o.   MRN: [id number]  HPI Pt here for follow up and management of chronic medical problems which includes hyperlipidemia. He is taking medications regularly.The patient today does complain of some neck pain and congestion. The patient continues to have problems with his neck and with chest congestion and sometimes some problems with swallowing. He does not use a long-acting bronchodilator and only uses his when necessary albuterol. I do not think he is using his Flonase regularly either. It appears that he is not taking his proton pump inhibitor either. He has problems with COPD and with reflux. I reminded him that it is important to use his inhalers regularly especially during this time of the year. Also reminded him is important to control his reflux symptoms with either an H2 blocker or proton pump inhibitor. The patient denies any shortness of breath other than his typical COPD and shortness of breath. He does have some left-sided chest pain that is sharp in nature. He also complains with his neck and has been seeing orthopedic surgeon for this. He complains of some trouble swallowing light food once to hang up in his throat. He otherwise denies any heartburn indigestion nausea vomiting diarrhea or blood in the stool. He passes his water well but it is slow in nature. 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      Patient Active Problem List   Diagnosis Date Noted  . DYSPHAGIA UNSPECIFIED 06/20/2007  . ALLERGIC RHINITIS 04/28/2007  . LUNG NODULE 04/28/2007  . HYPERLIPIDEMIA 03/28/2007  . COPD 03/28/2007  . GERD 03/28/2007  . BENIGN PROSTATIC HYPERTROPHY, HX OF 03/28/2007   Outpatient Encounter Prescriptions as of 06/06/2015  Medication Sig  .  albuterol (PROVENTIL) (2.5 MG/3ML) 0.083% nebulizer solution Take 3 mLs (2.5 mg total) by nebulization every 6 (six) hours as needed. Dx: 496  . aspirin 81 MG EC tablet Take 81 mg by mouth daily.    . calcium carbonate (OS-CAL) 600 MG TABS Take 600 mg by mouth 2 (two) times daily with a meal.    . finasteride (PROSCAR) 5 MG tablet TAKE 1 TABLET DAILY  . fluticasone (FLONASE) 50 MCG/ACT nasal spray Place 2 sprays into the nose daily.  . guaiFENesin (MUCINEX) 600 MG 12 hr tablet Take 1,200 mg by mouth 2 (two) times daily.    . LORazepam (ATIVAN) 0.5 MG tablet TAKE 1 TABLET DAILY AS NEEDED  . omeprazole (PRILOSEC) 40 MG capsule TAKE (1) CAPSULE TWICE DAILY.  . terazosin (HYTRIN) 5 MG capsule TAKE ONE CAPSULE EVERY DAY  . Vitamin D, Ergocalciferol, (DRISDOL) 50000 units CAPS capsule TAKE ONE CAPSULE BY MOUTH EVERY 7 DAYS   No facility-administered encounter medications on file as of 06/06/2015.      Review of Systems  Constitutional: Negative.   HENT: Positive for congestion.   Eyes: Negative.   Respiratory: Negative.   Cardiovascular: Negative.   Gastrointestinal: Negative.   Endocrine: Negative.   Genitourinary: Negative.   Musculoskeletal: Positive for neck pain.  Skin: Negative.   Allergic/Immunologic: Negative.   Neurological: Negative.   Hematological: Negative.   Psychiatric/Behavioral: Negative.        Objective:   Physical Exam  Constitutional: He is oriented to person, place,    Subjective:    Patient ID: Carlos Brooks, male    DOB: 03/07/1927, 82 y.o.   MRN: [id number]  HPI Pt here for follow up and management of chronic medical problems which includes hyperlipidemia. He is taking medications regularly.The patient today does complain of some neck pain and congestion. The patient continues to have problems with his neck and with chest congestion and sometimes some problems with swallowing. He does not use a long-acting bronchodilator and only uses his when necessary albuterol. I do not think he is using his Flonase regularly either. It appears that he is not taking his proton pump inhibitor either. He has problems with COPD and with reflux. I reminded him that it is important to use his inhalers regularly especially during this time of the year. Also reminded him is important to control his reflux symptoms with either an H2 blocker or proton pump inhibitor. The patient denies any shortness of breath other than his typical COPD and shortness of breath. He does have some left-sided chest pain that is sharp in nature. He also complains with his neck and has been seeing orthopedic surgeon for this. He complains of some trouble swallowing light food once to hang up in his throat. He otherwise denies any heartburn indigestion nausea vomiting diarrhea or blood in the stool. He passes his water well but it is slow in nature. 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      Patient Active Problem List   Diagnosis Date Noted  . DYSPHAGIA UNSPECIFIED 06/20/2007  . ALLERGIC RHINITIS 04/28/2007  . LUNG NODULE 04/28/2007  . HYPERLIPIDEMIA 03/28/2007  . COPD 03/28/2007  . GERD 03/28/2007  . BENIGN PROSTATIC HYPERTROPHY, HX OF 03/28/2007   Outpatient Encounter Prescriptions as of 06/06/2015  Medication Sig  .  albuterol (PROVENTIL) (2.5 MG/3ML) 0.083% nebulizer solution Take 3 mLs (2.5 mg total) by nebulization every 6 (six) hours as needed. Dx: 496  . aspirin 81 MG EC tablet Take 81 mg by mouth daily.    . calcium carbonate (OS-CAL) 600 MG TABS Take 600 mg by mouth 2 (two) times daily with a meal.    . finasteride (PROSCAR) 5 MG tablet TAKE 1 TABLET DAILY  . fluticasone (FLONASE) 50 MCG/ACT nasal spray Place 2 sprays into the nose daily.  . guaiFENesin (MUCINEX) 600 MG 12 hr tablet Take 1,200 mg by mouth 2 (two) times daily.    . LORazepam (ATIVAN) 0.5 MG tablet TAKE 1 TABLET DAILY AS NEEDED  . omeprazole (PRILOSEC) 40 MG capsule TAKE (1) CAPSULE TWICE DAILY.  . terazosin (HYTRIN) 5 MG capsule TAKE ONE CAPSULE EVERY DAY  . Vitamin D, Ergocalciferol, (DRISDOL) 50000 units CAPS capsule TAKE ONE CAPSULE BY MOUTH EVERY 7 DAYS   No facility-administered encounter medications on file as of 06/06/2015.      Review of Systems  Constitutional: Negative.   HENT: Positive for congestion.   Eyes: Negative.   Respiratory: Negative.   Cardiovascular: Negative.   Gastrointestinal: Negative.   Endocrine: Negative.   Genitourinary: Negative.   Musculoskeletal: Positive for neck pain.  Skin: Negative.   Allergic/Immunologic: Negative.   Neurological: Negative.   Hematological: Negative.   Psychiatric/Behavioral: Negative.        Objective:   Physical Exam  Constitutional: He is oriented to person, place,    Subjective:    Patient ID: Carlos Brooks, male    DOB: 03/07/1927, 82 y.o.   MRN: [id number]  HPI Pt here for follow up and management of chronic medical problems which includes hyperlipidemia. He is taking medications regularly.The patient today does complain of some neck pain and congestion. The patient continues to have problems with his neck and with chest congestion and sometimes some problems with swallowing. He does not use a long-acting bronchodilator and only uses his when necessary albuterol. I do not think he is using his Flonase regularly either. It appears that he is not taking his proton pump inhibitor either. He has problems with COPD and with reflux. I reminded him that it is important to use his inhalers regularly especially during this time of the year. Also reminded him is important to control his reflux symptoms with either an H2 blocker or proton pump inhibitor. The patient denies any shortness of breath other than his typical COPD and shortness of breath. He does have some left-sided chest pain that is sharp in nature. He also complains with his neck and has been seeing orthopedic surgeon for this. He complains of some trouble swallowing light food once to hang up in his throat. He otherwise denies any heartburn indigestion nausea vomiting diarrhea or blood in the stool. He passes his water well but it is slow in nature. 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      Patient Active Problem List   Diagnosis Date Noted  . DYSPHAGIA UNSPECIFIED 06/20/2007  . ALLERGIC RHINITIS 04/28/2007  . LUNG NODULE 04/28/2007  . HYPERLIPIDEMIA 03/28/2007  . COPD 03/28/2007  . GERD 03/28/2007  . BENIGN PROSTATIC HYPERTROPHY, HX OF 03/28/2007   Outpatient Encounter Prescriptions as of 06/06/2015  Medication Sig  .  albuterol (PROVENTIL) (2.5 MG/3ML) 0.083% nebulizer solution Take 3 mLs (2.5 mg total) by nebulization every 6 (six) hours as needed. Dx: 496  . aspirin 81 MG EC tablet Take 81 mg by mouth daily.    . calcium carbonate (OS-CAL) 600 MG TABS Take 600 mg by mouth 2 (two) times daily with a meal.    . finasteride (PROSCAR) 5 MG tablet TAKE 1 TABLET DAILY  . fluticasone (FLONASE) 50 MCG/ACT nasal spray Place 2 sprays into the nose daily.  . guaiFENesin (MUCINEX) 600 MG 12 hr tablet Take 1,200 mg by mouth 2 (two) times daily.    . LORazepam (ATIVAN) 0.5 MG tablet TAKE 1 TABLET DAILY AS NEEDED  . omeprazole (PRILOSEC) 40 MG capsule TAKE (1) CAPSULE TWICE DAILY.  . terazosin (HYTRIN) 5 MG capsule TAKE ONE CAPSULE EVERY DAY  . Vitamin D, Ergocalciferol, (DRISDOL) 50000 units CAPS capsule TAKE ONE CAPSULE BY MOUTH EVERY 7 DAYS   No facility-administered encounter medications on file as of 06/06/2015.      Review of Systems  Constitutional: Negative.   HENT: Positive for congestion.   Eyes: Negative.   Respiratory: Negative.   Cardiovascular: Negative.   Gastrointestinal: Negative.   Endocrine: Negative.   Genitourinary: Negative.   Musculoskeletal: Positive for neck pain.  Skin: Negative.   Allergic/Immunologic: Negative.   Neurological: Negative.   Hematological: Negative.   Psychiatric/Behavioral: Negative.        Objective:   Physical Exam  Constitutional: He is oriented to person, place,

## 2015-06-06 NOTE — Telephone Encounter (Signed)
The patient says that the Prilosec that he was taking causes him to have loose bowel movements. He could try some Pepcid before meals and see if this bothers him it is similar to Zantac.

## 2015-06-06 NOTE — Patient Instructions (Addendum)
Medicare Annual Wellness Visit  [facility] and the medical providers at [facility] strive to bring you the best medical care.  In doing so we not only want to address your current medical conditions and concerns but also to detect new conditions early and prevent illness, disease and health-related problems.    Medicare offers a yearly Wellness Visit which allows our clinical staff to assess your need for preventative services including immunizations, lifestyle education, counseling to decrease risk of preventable diseases and screening for fall risk and other medical concerns.    This visit is provided free of charge (no copay) for all Medicare recipients. The clinical pharmacists at [facility] have begun to conduct these Wellness Visits which will also include a thorough review of all your medications.    As you primary medical provider recommend that you make an appointment for your Annual Wellness Visit if you have not done so already this year.  You may set up this appointment before you leave today or you may call back [phone] and schedule an appointment.  Please make sure when you call that you mention that you are scheduling your Annual Wellness Visit with the clinical pharmacist so that the appointment may be made for the proper length of time.     Continue current medications. Continue good therapeutic lifestyle changes which include good diet and exercise. Fall precautions discussed with patient. If an FOBT was given today- please return it to our front desk. If you are over 27 years old - you may need Prevnar 49 or the adult Pneumonia vaccine.  **Flu shots are available--- please call and schedule a FLU-CLINIC appointment**  After your visit with [place] today you will receive a survey in the mail or online from [employer] regarding your care with [place]. Please take a moment to fill this out. Your feedback is very  important to [place] as you can help [place] better understand your patient needs as well as improve your experience and satisfaction. WE CARE ABOUT YOU!!!   Continue to follow up with pulmonology Wear respiratory protection because of the heavy pollen outside--- this is very important Use your Flonase regularly Use your albuterol as needed Consider trying Symbicort or Brio and discuss this with your pulmonologist Drink plenty of fluids Take Tylenol for aches pains and fever If the neck problems continue call Dr. Nelva Bush to consider another injection in the neck

## 2015-06-06 NOTE — Telephone Encounter (Signed)
Pt aware not to use the OTC Zantac - but wonders if you would like him to be on a similar drug / different kind.?

## 2015-06-06 NOTE — Telephone Encounter (Signed)
Pt aware.

## 2015-06-07 LAB — HEPATIC FUNCTION PANEL
ALK PHOS: 39 IU/L (ref 39–117)
ALT: 13 IU/L (ref 0–44)
AST: 18 IU/L (ref 0–40)
Albumin: 4.6 g/dL (ref 3.5–4.7)
BILIRUBIN, DIRECT: 0.13 mg/dL (ref 0.00–0.40)
Bilirubin Total: 0.5 mg/dL (ref 0.0–1.2)
Total Protein: 7 g/dL (ref 6.0–8.5)

## 2015-06-07 LAB — BMP8+EGFR
BUN/Creatinine Ratio: 21 (ref 10–22)
BUN: 20 mg/dL (ref 8–27)
CALCIUM: 9.6 mg/dL (ref 8.6–10.2)
CHLORIDE: 101 mmol/L (ref 96–106)
CO2: 24 mmol/L (ref 18–29)
Creatinine, Ser: 0.96 mg/dL (ref 0.76–1.27)
GFR calc non Af Amer: 71 mL/min/{1.73_m2} (ref >59)
GFR, EST AFRICAN AMERICAN: 82 mL/min/{1.73_m2} (ref >59)
Glucose: 83 mg/dL (ref 65–99)
Potassium: 4.6 mmol/L (ref 3.5–5.2)
Sodium: 142 mmol/L (ref 134–144)

## 2015-06-07 LAB — CBC WITH DIFFERENTIAL/PLATELET
BASOS ABS: 0.1 10*3/uL (ref 0.0–0.2)
Basos: 1 %
EOS (ABSOLUTE): 1.1 10*3/uL — AB (ref 0.0–0.4)
Eos: 14 %
HEMOGLOBIN: 14 g/dL (ref 12.6–17.7)
Hematocrit: 41.6 % (ref 37.5–51.0)
IMMATURE GRANS (ABS): 0.1 10*3/uL (ref 0.0–0.1)
IMMATURE GRANULOCYTES: 1 %
LYMPHS: 30 %
Lymphocytes Absolute: 2.4 10*3/uL (ref 0.7–3.1)
MCH: 31 pg (ref 26.6–33.0)
MCHC: 33.7 g/dL (ref 31.5–35.7)
MCV: 92 fL (ref 79–97)
MONOCYTES: 10 %
Monocytes Absolute: 0.8 10*3/uL (ref 0.1–0.9)
NEUTROS ABS: 3.5 10*3/uL (ref 1.4–7.0)
NEUTROS PCT: 44 %
PLATELETS: 193 10*3/uL (ref 150–379)
RBC: 4.51 x10E6/uL (ref 4.14–5.80)
RDW: 14.4 % (ref 12.3–15.4)
WBC: 8 10*3/uL (ref 3.4–10.8)

## 2015-06-07 LAB — LIPID PANEL
CHOLESTEROL TOTAL: 207 mg/dL — AB (ref 100–199)
Chol/HDL Ratio: 4.5 ratio units (ref 0.0–5.0)
HDL: 46 mg/dL (ref >39)
LDL Calculated: 128 mg/dL — ABNORMAL HIGH (ref 0–99)
TRIGLYCERIDES: 167 mg/dL — AB (ref 0–149)
VLDL Cholesterol Cal: 33 mg/dL (ref 5–40)

## 2015-06-07 LAB — VITAMIN D 25 HYDROXY (VIT D DEFICIENCY, FRACTURES): Vit D, 25-Hydroxy: 39.4 ng/mL (ref 30.0–100.0)

## 2015-07-23 DIAGNOSIS — M47812 Spondylosis without myelopathy or radiculopathy, cervical region: Secondary | ICD-10-CM | POA: Diagnosis not present

## 2015-08-01 ENCOUNTER — Encounter: Payer: Medicare Other | Admitting: Cardiology

## 2015-08-01 ENCOUNTER — Telehealth: Payer: Self-pay | Admitting: Family Medicine

## 2015-08-01 NOTE — Telephone Encounter (Signed)
Practice is EPIC

## 2015-08-07 ENCOUNTER — Encounter: Payer: Self-pay | Admitting: Cardiology

## 2015-08-07 ENCOUNTER — Ambulatory Visit (INDEPENDENT_AMBULATORY_CARE_PROVIDER_SITE_OTHER): Payer: PPO | Admitting: Cardiology

## 2015-08-07 ENCOUNTER — Other Ambulatory Visit: Payer: Self-pay | Admitting: Family Medicine

## 2015-08-07 VITALS — BP 142/78 | HR 56 | Ht 67.0 in | Wt 141.0 lb

## 2015-08-07 DIAGNOSIS — E785 Hyperlipidemia, unspecified: Secondary | ICD-10-CM

## 2015-08-07 DIAGNOSIS — R0600 Dyspnea, unspecified: Secondary | ICD-10-CM | POA: Insufficient documentation

## 2015-08-07 NOTE — Telephone Encounter (Signed)
Last seen 06/06/15  DWM  If approved route to nurse to call into [facility]

## 2015-08-07 NOTE — Patient Instructions (Signed)
Medication Instructions:  The current medical regimen is effective;  continue present plan and medications.  Follow-Up: Follow up in 2 years with Dr. Percival Spanish in [place].  You will receive a letter in the mail 2 months before you are due.  Please call us when you receive this letter to schedule your follow up appointment.  If you need a refill on your cardiac medications before your next appointment, please call your pharmacy.  Thank you for choosing [facility]!!

## 2015-08-07 NOTE — Progress Notes (Signed)
Cardiology Office Note   Date:  08/07/2015   ID:  Carlos Brooks, DOB 1926-04-14, MRN [id number]  PCP:  Redge Gainer, MD  Cardiologist:   Minus Breeding, MD   No chief complaint on file.     History of Present Illness: Carlos Brooks is a 82 y.o. male who presents for evaluation of dyspnea. He has previously seen Dr. Mare Ferrari although it has been greater than 3 years. He has a history of COPD. However, he's not had any cardiac history in the past. He does have shortness of breath with exertion and this has been slightly worse over the years. However, he's not describing any chest pressure, neck or arm discomfort. He doesn't describe palpitations, presyncope or syncope. He does not have any PND or orthopnea. 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  Past Medical History  Diagnosis Date  . COPD (chronic obstructive pulmonary disease) ([place])   . BPH (benign prostatic hyperplasia)   . Hyperlipidemia   . GERD (gastroesophageal reflux disease)   . Hiatal hernia   . Cataract     Past Surgical History  Procedure Laterality Date  . Hernia repair      x 3, inguinal   . Eye surgery       Current Outpatient Prescriptions  Medication Sig Dispense Refill  . albuterol (PROVENTIL) (2.5 MG/3ML) 0.083% nebulizer solution Take 3 mLs (2.5 mg total) by nebulization every 6 (six) hours as needed. Dx: 496 75 mL 3  . aspirin 81 MG EC tablet Take 81 mg by mouth daily.      . calcium carbonate (OS-CAL) 600 MG TABS Take 600 mg by mouth 2 (two) times daily with a meal.      . finasteride (PROSCAR) 5 MG tablet Take 5 mg by mouth daily.    . fluticasone (FLONASE) 50 MCG/ACT nasal spray Place 2 sprays into the nose daily.    Marland Kitchen guaiFENesin (MUCINEX) 600 MG 12 hr tablet Take 1,200 mg by mouth 2 (two) times daily.      Marland Kitchen LORazepam (ATIVAN) 0.5 MG tablet Take 0.5 mg by mouth daily.    Marland Kitchen terazosin (HYTRIN) 5 MG capsule Take 5 mg by mouth at bedtime.    . Vitamin D, Ergocalciferol,  (DRISDOL) 50000 units CAPS capsule TAKE ONE CAPSULE BY MOUTH EVERY 7 DAYS 12 capsule 0   No current facility-administered medications for this visit.    Allergies:   Clarithromycin; Crestor; Lipitor; Niaspan; Pneumovax; Pravachol; Ranitidine hcl; and Simvastatin    Social History:  The patient  reports that he quit smoking about 22 years ago. He has never used smokeless tobacco. He reports that he does not drink alcohol or use illicit drugs.   Family History:  The patient's family history includes Cancer in his father and mother; Heart attack (age of onset: 77) in his son; Heart attack (age of onset: 68) in his brother.    ROS:  Please see the history of present illness.   Otherwise, review of systems are positive for none.   All other systems are reviewed and negative.    PHYSICAL EXAM: VS:  BP 142/78 mmHg  Pulse 56  Ht 5\' 7"  (1.702 m)  Wt 141 lb (63.957 kg)  BMI 22.08 kg/m2 , BMI Body mass index is 22.08 kg/(m^2). GENERAL:  Well appearing HEENT:  Pupils equal round and reactive, fundi not visualized, oral mucosa unremarkable NECK:  No jugular venous distention, waveform within normal limits, carotid upstroke  brisk and symmetric, no bruits, no thyromegaly LYMPHATICS:  No cervical, inguinal adenopathy LUNGS:  Diffuse mild wheezing BACK:  No CVA tenderness CHEST:  Unremarkable HEART:  PMI not displaced or sustained,S1 and S2 within normal limits, no S3, no S4, no clicks, no rubs, no murmurs ABD:  Flat, positive bowel sounds normal in frequency in pitch, no bruits, no rebound, no guarding, no midline pulsatile mass, no hepatomegaly, no splenomegaly EXT:  2 plus pulses throughout, no edema, no cyanosis no clubbing SKIN:  No rashes no nodules NEURO:  Cranial nerves II through XII grossly intact, motor grossly intact throughout PSYCH:  Cognitively intact, oriented to person place and time    EKG:  EKG is ordered today. The ekg ordered today demonstrates sinus bradycardia, rate 54,  axis within normal limits, intervals within normal limits, no acute ST-T wave changes.   Recent Labs: 08/30/2014: Hemoglobin 14.0* 06/06/2015: ALT 13; BUN 20; Creatinine, Ser 0.96; Platelets 193; Potassium 4.6; Sodium 142    Lipid Panel    Component Value Date/Time   CHOL 207* 06/06/2015 1033   CHOL 214* 01/15/2015 0932   CHOL 197 08/04/2012 1151   TRIG 167* 06/06/2015 1033   TRIG 144 01/15/2015 0932   TRIG 162* 08/04/2012 1151   HDL 46 06/06/2015 1033   HDL 47 01/15/2015 0932   HDL 45 08/04/2012 1151   CHOLHDL 4.5 06/06/2015 1033   LDLCALC 128* 06/06/2015 1033   LDLCALC 154* 12/05/2013 0920   LDLCALC 120* 08/04/2012 1151      Wt Readings from Last 3 Encounters:  08/07/15 141 lb (63.957 kg)  06/06/15 145 lb (65.772 kg)  01/15/15 144 lb (65.318 kg)      Other studies Reviewed: Additional studies/ records that were reviewed today include: Office records. Review of the above records demonstrates:  Please see elsewhere in the note.     ASSESSMENT AND PLAN:  Dyspnea:  I suspect this is related to his COPD. Seems to be chronic and stable. I don't see any evidence of heart failure. At this point no change in therapy would be indicated. I don't think further imaging is indicated unless he has worsening symptoms.  Bradycardia:  He is not reporting any symptoms related to this. No change in therapy is indicated.  Dyslipidemia:  He does have some mild dyslipidemia. However, the absence of coronary disease or would not suggest any change in therapy.  Current medicines are reviewed at length with the patient today.  The patient does not have concerns regarding medicines.  The following changes have been made:  no change  Labs/ tests ordered today include: None  No orders of the defined types were placed in this encounter.     Disposition:   FU with me in a couple of years.      Signed, Minus Breeding, MD  08/07/2015 10:06 AM    [facility]

## 2015-09-02 DIAGNOSIS — H04123 Dry eye syndrome of bilateral lacrimal glands: Secondary | ICD-10-CM | POA: Diagnosis not present

## 2015-09-02 DIAGNOSIS — H40033 Anatomical narrow angle, bilateral: Secondary | ICD-10-CM | POA: Diagnosis not present

## 2015-09-04 ENCOUNTER — Other Ambulatory Visit: Payer: Self-pay | Admitting: Family Medicine

## 2015-09-06 DIAGNOSIS — L820 Inflamed seborrheic keratosis: Secondary | ICD-10-CM | POA: Diagnosis not present

## 2015-09-06 DIAGNOSIS — Z85828 Personal history of other malignant neoplasm of skin: Secondary | ICD-10-CM | POA: Diagnosis not present

## 2015-09-06 DIAGNOSIS — D1801 Hemangioma of skin and subcutaneous tissue: Secondary | ICD-10-CM | POA: Diagnosis not present

## 2015-09-06 DIAGNOSIS — L821 Other seborrheic keratosis: Secondary | ICD-10-CM | POA: Diagnosis not present

## 2015-09-18 ENCOUNTER — Ambulatory Visit (INDEPENDENT_AMBULATORY_CARE_PROVIDER_SITE_OTHER): Payer: PPO | Admitting: Family Medicine

## 2015-09-18 ENCOUNTER — Encounter: Payer: Self-pay | Admitting: Family Medicine

## 2015-09-18 VITALS — BP 142/77 | HR 68 | Temp 97.2°F | Ht 67.0 in | Wt 142.0 lb

## 2015-09-18 DIAGNOSIS — M1990 Unspecified osteoarthritis, unspecified site: Secondary | ICD-10-CM

## 2015-09-18 MED ORDER — MELOXICAM 7.5 MG PO TABS: 7.5000 mg | ORAL_TABLET | Freq: Every day | ORAL | Status: DC

## 2015-09-18 NOTE — Progress Notes (Signed)
BP 142/77 mmHg  Pulse 68  Temp(Src) 97.2 F (36.2 C) (Oral)  Ht 5\' 7"  (1.702 m)  Wt 142 lb (64.411 kg)  BMI 22.24 kg/m2   Subjective:    Patient ID: Carlos Brooks, male    DOB: August 20, 1926, 82 y.o.   MRN: [id number]  HPI: Carlos Brooks is a 82 y.o. male presenting on 09/18/2015 for swelling knuckles   HPI Pain and swelling in joints associated with his fingers. Patient has pain and swelling that's been coming on gradually in the joints of his fingers. Specifically it's worse on his ring finger and pinky finger on his left hand. The patient has been to a dermatologist and they thought it might be cystic in nature but he also has some firm hard areas underneath. There is no erythema or warmth noted in the joint. There is no decreased range of motion. Pain is low-grade and 2 out of 10.  Relevant past medical, surgical, family and social history reviewed and updated as indicated. Interim medical history since our last visit reviewed. Allergies and medications reviewed and updated.  Review of Systems  Constitutional: Negative for fever.  HENT: Negative for ear discharge and ear pain.   Eyes: Negative for discharge and visual disturbance.  Respiratory: Negative for shortness of breath and wheezing.   Cardiovascular: Negative for chest pain and leg swelling.  Gastrointestinal: Negative for abdominal pain, diarrhea and constipation.  Genitourinary: Negative for difficulty urinating.  Musculoskeletal: Positive for joint swelling and arthralgias. Negative for back pain and gait problem.  Skin: Negative for rash.  Neurological: Negative for syncope, light-headedness and headaches.  All other systems reviewed and are negative.   Per HPI unless specifically indicated above     Medication List       This list is accurate as of: 09/18/15  9:54 AM.  Always use your most recent med list.               albuterol (2.5 MG/3ML) 0.083% nebulizer solution  Commonly known as:   PROVENTIL  Take 3 mLs (2.5 mg total) by nebulization every 6 (six) hours as needed. Dx: 496     aspirin 81 MG EC tablet  Take 81 mg by mouth daily.     calcium carbonate 600 MG Tabs tablet  Commonly known as:  OS-CAL  Take 600 mg by mouth 2 (two) times daily with a meal.     finasteride 5 MG tablet  Commonly known as:  PROSCAR  Take 5 mg by mouth daily.     fluticasone 50 MCG/ACT nasal spray  Commonly known as:  FLONASE  Place 2 sprays into the nose daily.     guaiFENesin 600 MG 12 hr tablet  Commonly known as:  MUCINEX  Take 1,200 mg by mouth 2 (two) times daily.     LORazepam 0.5 MG tablet  Commonly known as:  ATIVAN  TAKE 1 TABLET DAILY AS NEEDED     meloxicam 7.5 MG tablet  Commonly known as:  MOBIC  Take 1 tablet (7.5 mg total) by mouth daily.     terazosin 5 MG capsule  Commonly known as:  HYTRIN  TAKE ONE CAPSULE EVERY DAY     Vitamin D (Ergocalciferol) 50000 units Caps capsule  Commonly known as:  DRISDOL  TAKE ONE CAPSULE BY MOUTH EVERY 7 DAYS           Objective:    BP 142/77 mmHg  Pulse 68  Temp(Src) 97.2 F (36.2 C) (  Oral)  Ht 5\' 7"  (1.702 m)  Wt 142 lb (64.411 kg)  BMI 22.24 kg/m2  Wt Readings from Last 3 Encounters:  09/18/15 142 lb (64.411 kg)  08/07/15 141 lb (63.957 kg)  06/06/15 145 lb (65.772 kg)    Physical Exam  Constitutional: He is oriented to person, place, and time. He appears well-developed and well-nourished. No distress.  Eyes: Conjunctivae and EOM are normal. Pupils are equal, round, and reactive to light. Right eye exhibits no discharge. No scleral icterus.  Neck: Neck supple. No thyromegaly present.  Cardiovascular: Normal rate, regular rhythm, normal heart sounds and intact distal pulses.   No murmur heard. Pulmonary/Chest: Effort normal and breath sounds normal. No respiratory distress. He has no wheezes.  Musculoskeletal: Normal range of motion. He exhibits no edema.       Left hand: He exhibits tenderness (Tenderness  and swelling in PIP joint of fourth and fifth finger on left hand. Low-grade tenderness are all PIP joints. Swelling is more firm consistent with a possible Heberden's). Normal sensation noted. Normal strength noted.  Lymphadenopathy:    He has no cervical adenopathy.  Neurological: He is alert and oriented to person, place, and time. Coordination normal.  Skin: Skin is warm and dry. No rash noted. He is not diaphoretic.  Psychiatric: He has a normal mood and affect. His behavior is normal.  Nursing note and vitals reviewed.     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  Counseling provided for all of the vaccine components No orders of the defined types were placed in this encounter.    Caryl Pina, MD [facility] 09/18/2015, 9:54 AM

## 2015-10-03 ENCOUNTER — Other Ambulatory Visit: Payer: Self-pay | Admitting: Family Medicine

## 2015-10-03 NOTE — Telephone Encounter (Signed)
Last filled 09/07/15

## 2015-10-21 ENCOUNTER — Ambulatory Visit (INDEPENDENT_AMBULATORY_CARE_PROVIDER_SITE_OTHER): Payer: PPO | Admitting: Family Medicine

## 2015-10-21 ENCOUNTER — Encounter: Payer: Self-pay | Admitting: Family Medicine

## 2015-10-21 VITALS — BP 128/68 | HR 57 | Temp 97.0°F | Ht 67.0 in | Wt 143.0 lb

## 2015-10-21 DIAGNOSIS — E559 Vitamin D deficiency, unspecified: Secondary | ICD-10-CM

## 2015-10-21 DIAGNOSIS — E785 Hyperlipidemia, unspecified: Secondary | ICD-10-CM

## 2015-10-21 DIAGNOSIS — M79642 Pain in left hand: Secondary | ICD-10-CM | POA: Diagnosis not present

## 2015-10-21 DIAGNOSIS — G629 Polyneuropathy, unspecified: Secondary | ICD-10-CM

## 2015-10-21 DIAGNOSIS — I700 Atherosclerosis of aorta: Secondary | ICD-10-CM | POA: Diagnosis not present

## 2015-10-21 DIAGNOSIS — M79641 Pain in right hand: Secondary | ICD-10-CM

## 2015-10-21 DIAGNOSIS — J449 Chronic obstructive pulmonary disease, unspecified: Secondary | ICD-10-CM | POA: Diagnosis not present

## 2015-10-21 DIAGNOSIS — N400 Enlarged prostate without lower urinary tract symptoms: Secondary | ICD-10-CM

## 2015-10-21 DIAGNOSIS — K219 Gastro-esophageal reflux disease without esophagitis: Secondary | ICD-10-CM | POA: Diagnosis not present

## 2015-10-21 DIAGNOSIS — J4489 Other specified chronic obstructive pulmonary disease: Secondary | ICD-10-CM

## 2015-10-21 NOTE — Patient Instructions (Addendum)
Medicare Annual Wellness Visit  [facility] [address] and the medical providers at [facility] strive to bring you the best medical care.  In doing so we not only want to address your current medical conditions and concerns but also to detect new conditions early and prevent illness, disease and health-related problems.    Medicare offers a yearly Wellness Visit which allows our clinical staff to assess your need for preventative services including immunizations, lifestyle education, counseling to decrease risk of preventable diseases and screening for fall risk and other medical concerns.    This visit is provided free of charge (no copay) for all Medicare recipients. The clinical pharmacists at [facility] have begun to conduct these Wellness Visits which will also include a thorough review of all your medications.    As you primary medical provider recommend that you make an appointment for your Annual Wellness Visit if you have not done so already this year.  You may set up this appointment before you leave today or you may call back [phone] and schedule an appointment.  Please make sure when you call that you mention that you are scheduling your Annual Wellness Visit with the clinical pharmacist so that the appointment may be made for the proper length of time.     Continue current medications. Continue good therapeutic lifestyle changes which include good diet and exercise. Fall precautions discussed with patient. If an FOBT was given today- please return it to our front desk. If you are over 42 years old - you may need Prevnar 49 or the adult Pneumonia vaccine.  **Flu shots a  After your visit with [place] today you will receive a survey in the mail or online from [employer] regarding your care with [place]. Please take a moment to fill this out. Your feedback is very important to [place] as you can help [place] better understand your patient needs  as well as improve your experience and satisfaction. WE CARE ABOUT YOU!!!   We will arrange for you to have an appointment with the hand surgeon in [place] to help check out your arthralgias in the left hand and possibly receive some injections We will also ask that you pick up some Zantac or ranitidine 150 mg twice daily, the equate brand at [facility] and take this twice daily before breakfast and supper Continue to follow-up with pulmonology Be sure and continue with Mucinex and use your Advair inhaler more regularly as this would help your breathing Drink plenty of fluids and stay well hydrated Avoid NSAIDs as much as possible as these are not good for the stomach nor good for your blood pressure. Return the FOBT follow-up with Dr. Nelva Bush as needed Follow-up with Dr. Luan Pulling

## 2015-10-21 NOTE — Progress Notes (Signed)
Subjective:    Patient ID: Carlos Brooks, male    DOB: May 17, 1926, 82 y.o.   MRN: [id number]  HPI Pt here for follow up and management of chronic medical problems which includes hyperlipidemia and GERD. He is taking medications regularly.The patient is doing well overall. He does today complain of some neck pain and right hip pain. He has a history of COPD and sees the pulmonologist on a regular basis. He also has hyperlipidemia and GERD. He is due to return an FOBT and get lab work. His vital signs are stable and his weight is stable. He is intolerant to statins. The patient has multiple complaints with his joints including his right hip with stiffness and soreness,Continued problems with his neck for which he has seen Dr. Nelva Bush, and ongoing problems especially with the left hand and the joints and swelling in his left hand. The patient also complains with swallowing and feeling that food gets hung up in his throat. He is had multiple evaluations for this with no specific findings that he is aware of or that I am aware of. He recently had one episode where he had some dark bowel movements. He also has some indigestion. He does not describe any chest pain or issues with his COPD beyond his usual issues with these. He's passing his water without problems. It is just slow at times. He is up-to-date on his eye exams.     Patient Active Problem List   Diagnosis Date Noted  . Dyspnea 08/07/2015  . DYSPHAGIA UNSPECIFIED 06/20/2007  . Allergic rhinitis 04/28/2007  . LUNG NODULE 04/28/2007  . Hyperlipidemia LDL goal <100 03/28/2007  . COPD (chronic obstructive pulmonary disease) with chronic bronchitis ([place]) 03/28/2007  . GERD 03/28/2007  . BPH (benign prostatic hyperplasia) 03/28/2007   Outpatient Encounter Prescriptions as of 10/21/2015  Medication Sig  . albuterol (PROVENTIL) (2.5 MG/3ML) 0.083% nebulizer solution Take 3 mLs (2.5 mg total) by nebulization every 6 (six) hours as needed. Dx: 496   . aspirin 81 MG EC tablet Take 81 mg by mouth daily.    . calcium carbonate (OS-CAL) 600 MG TABS Take 600 mg by mouth 2 (two) times daily with a meal.    . finasteride (PROSCAR) 5 MG tablet Take 5 mg by mouth daily.  . fluticasone (FLONASE) 50 MCG/ACT nasal spray Place 2 sprays into the nose daily.  [facility] Kitchen guaiFENesin (MUCINEX) 600 MG 12 hr tablet Take 1,200 mg by mouth 2 (two) times daily.    Marland Kitchen LORazepam (ATIVAN) 0.5 MG tablet TAKE 1 TABLET EVERY DAY AS NEEDED  . meloxicam (MOBIC) 7.5 MG tablet Take 1 tablet (7.5 mg total) by mouth daily.  Marland Kitchen terazosin (HYTRIN) 5 MG capsule TAKE ONE CAPSULE EVERY DAY  . Vitamin D, Ergocalciferol, (DRISDOL) 50000 units CAPS capsule TAKE ONE CAPSULE BY MOUTH EVERY 7 DAYS  . azithromycin (ZITHROMAX) 250 MG tablet TAKE 2 TABLETS BY MOUTH TODAY, THEN TAKE 1 TABLET DAILY FOR 4 DAYS   No facility-administered encounter medications on file as of 10/21/2015.       Review of Systems  Constitutional: Negative.   HENT: Negative.   Eyes: Negative.   Respiratory: Negative.   Cardiovascular: Negative.   Gastrointestinal: Negative.   Endocrine: Negative.   Genitourinary: Negative.   Musculoskeletal: Positive for arthralgias (right hip pain) and neck pain.  Skin: Negative.   Allergic/Immunologic: Negative.   Neurological: Negative.   Hematological: Negative.   Psychiatric/Behavioral: Negative.        Objective:  Physical Exam  Constitutional: He is oriented to person, place, and time. He appears well-developed and well-nourished. No distress.  The patient is small framed but alert and communicative. He looks much younger than his stated age of 75 years.  HENT:  Head: Normocephalic and atraumatic.  Right Ear: External ear normal.  Left Ear: External ear normal.  Mouth/Throat: Oropharynx is clear and moist. No oropharyngeal exudate.  Nasal congestion bilaterally  Eyes: Conjunctivae and EOM are normal. Pupils are equal, round, and reactive to light. Right eye  exhibits no discharge. Left eye exhibits no discharge. No scleral icterus.  Neck: Normal range of motion. Neck supple. No thyromegaly present.  No bruits thyromegaly or adenopathy  Cardiovascular: Normal rate, regular rhythm, normal heart sounds and intact distal pulses.   No murmur heard. The heart has a regular rate and rhythm at 72/m  Pulmonary/Chest: Effort normal and breath sounds normal. No respiratory distress. He has no wheezes. He has no rales. He exhibits no tenderness.  The lungs are clear anteriorly and posteriorly but with faint wheezes. With coughing there is a tight cough.  Abdominal: Soft. Bowel sounds are normal. He exhibits no mass. There is tenderness. There is no rebound and no guarding.  Slight epigastric tenderness  Musculoskeletal: Normal range of motion. He exhibits no edema.  The patient has stiffness in the neck and the right hip and the hip pain seems to be getting some better and he does not recall injuring it in any way. He also has a lot of problems with the PIP joints third finger of the left hand. He had an appointment to see a hand surgeon but they were not on his insurance and he did not follow-up with them.  Lymphadenopathy:    He has no cervical adenopathy.  Neurological: He is alert and oriented to person, place, and time. He has normal reflexes. No cranial nerve deficit.  Skin: Skin is warm and dry. No rash noted.  Psychiatric: He has a normal mood and affect. His behavior is normal. Judgment and thought content normal.  Nursing note and vitals reviewed.  BP 128/68 (BP Location: Left Arm)   Pulse (!) 57   Temp 97 F (36.1 C) (Oral)   Ht _0  (1.702 m)   Wt 143 lb (64.9 kg)   BMI 22.40 kg/m         Assessment & Plan:  1. Hyperlipidemia -The patient is statin intolerant as multiple statins have been tried. He must continue with as aggressive therapeutic lifestyle changes as possible - CBC with Differential/Platelet - BMP8+EGFR - Hepatic  function panel - NMR, lipoprofile  2. Vitamin D deficiency -Continue with vitamin D replacement pending results of lab work - CBC with Differential/Platelet - VITAMIN D 25 Hydroxy (Vit-D Deficiency, Fractures)  3. Gastroesophageal reflux disease, esophagitis presence not specified -He was encouraged to use ranitidine 150 mg twice daily before breakfast and supper - CBC with Differential/Platelet  4. Thoracic aorta atherosclerosis ([place]) -Continue with aggressive therapeutic lifestyle changes - CBC with Differential/Platelet  5. BPH (benign prostatic hyperplasia) -Continue to drink plenty of fluids. He still has problems with a slow stream at times and urgency at other times. - CBC with Differential/Platelet  6. Bilateral hand pain -Referral to Dr. Apolonio Schneiders for possible injection - Ambulatory referral to Orthopedic Surgery  7. COPD -Continue to follow-up with Dr. Luan Pulling.  8. Peripheral neuropathy no specific complaints regarding this problem today.  Patient Instructions  Medicare Annual Wellness Visit  [facility] and the medical providers at [facility] strive to bring you the best medical care.  In doing so we not only want to address your current medical conditions and concerns but also to detect new conditions early and prevent illness, disease and health-related problems.    Medicare offers a yearly Wellness Visit which allows our clinical staff to assess your need for preventative services including immunizations, lifestyle education, counseling to decrease risk of preventable diseases and screening for fall risk and other medical concerns.    This visit is provided free of charge (no copay) for all Medicare recipients. The clinical pharmacists at [facility] have begun to conduct these Wellness Visits which will also include a thorough review of all your medications.    As you primary medical provider  recommend that you make an appointment for your Annual Wellness Visit if you have not done so already this year.  You may set up this appointment before you leave today or you may call back [phone]) and schedule an appointment.  Please make sure when you call that you mention that you are scheduling your Annual Wellness Visit with the clinical pharmacist so that the appointment may be made for the proper length of time.     Continue current medications. Continue good therapeutic lifestyle changes which include good diet and exercise. Fall precautions discussed with patient. If an FOBT was given today- please return it to our front desk. If you are over 99 years old - you may need Prevnar 33 or the adult Pneumonia vaccine.  **Flu shots a  After your visit with [place] today you will receive a survey in the mail or online from [employer] regarding your care with [place]. Please take a moment to fill this out. Your feedback is very important to [place] as you can help [place] better understand your patient needs as well as improve your experience and satisfaction. WE CARE ABOUT YOU!!!   We will arrange for you to have an appointment with the hand surgeon in [place] to help check out your arthralgias in the left hand and possibly receive some injections We will also ask that you pick up some Zantac or ranitidine 150 mg twice daily, the equate brand at [facility] and take this twice daily before breakfast and supper Continue to follow-up with pulmonology Be sure and continue with Mucinex and use your Advair inhaler more regularly as this would help your breathing Drink plenty of fluids and stay well hydrated Avoid NSAIDs as much as possible as these are not good for the stomach nor good for your blood pressure. Return the FOBT follow-up with Dr. Nelva Bush as needed Follow-up with Dr. Hassan Rowan MD

## 2015-10-22 LAB — NMR, LIPOPROFILE
CHOLESTEROL: 194 mg/dL (ref 100–199)
HDL CHOLESTEROL BY NMR: 46 mg/dL (ref >39)
HDL PARTICLE NUMBER: 29 umol/L — AB (ref >=30.5)
LDL Particle Number: 1309 nmol/L — ABNORMAL HIGH (ref <1000)
LDL Size: 20.3 nm (ref >20.5)
LDL-C: 110 mg/dL — AB (ref 0–99)
LP-IR SCORE: 61 — AB (ref <=45)
Small LDL Particle Number: 728 nmol/L — ABNORMAL HIGH (ref <=527)
Triglycerides by NMR: 191 mg/dL — ABNORMAL HIGH (ref 0–149)

## 2015-10-22 LAB — HEPATIC FUNCTION PANEL
ALBUMIN: 4.6 g/dL (ref 3.5–4.7)
ALT: 11 IU/L (ref 0–44)
AST: 16 IU/L (ref 0–40)
Alkaline Phosphatase: 39 IU/L (ref 39–117)
BILIRUBIN TOTAL: 0.4 mg/dL (ref 0.0–1.2)
Bilirubin, Direct: 0.11 mg/dL (ref 0.00–0.40)
Total Protein: 6.8 g/dL (ref 6.0–8.5)

## 2015-10-22 LAB — VITAMIN D 25 HYDROXY (VIT D DEFICIENCY, FRACTURES): Vit D, 25-Hydroxy: 31.4 ng/mL (ref 30.0–100.0)

## 2015-10-22 LAB — CBC WITH DIFFERENTIAL/PLATELET
BASOS ABS: 0.1 10*3/uL (ref 0.0–0.2)
Basos: 1 %
EOS (ABSOLUTE): 0.8 10*3/uL — AB (ref 0.0–0.4)
Eos: 11 %
Hematocrit: 42.8 % (ref 37.5–51.0)
Hemoglobin: 14.3 g/dL (ref 12.6–17.7)
IMMATURE GRANULOCYTES: 0 %
Immature Grans (Abs): 0 10*3/uL (ref 0.0–0.1)
LYMPHS ABS: 2.1 10*3/uL (ref 0.7–3.1)
Lymphs: 28 %
MCH: 30.7 pg (ref 26.6–33.0)
MCHC: 33.4 g/dL (ref 31.5–35.7)
MCV: 92 fL (ref 79–97)
MONOS ABS: 0.7 10*3/uL (ref 0.1–0.9)
Monocytes: 9 %
NEUTROS PCT: 51 %
Neutrophils Absolute: 3.8 10*3/uL (ref 1.4–7.0)
PLATELETS: 200 10*3/uL (ref 150–379)
RBC: 4.66 x10E6/uL (ref 4.14–5.80)
RDW: 14.3 % (ref 12.3–15.4)
WBC: 7.5 10*3/uL (ref 3.4–10.8)

## 2015-10-22 LAB — BMP8+EGFR
BUN/Creatinine Ratio: 18 (ref 10–24)
BUN: 18 mg/dL (ref 8–27)
CALCIUM: 9.6 mg/dL (ref 8.6–10.2)
CHLORIDE: 102 mmol/L (ref 96–106)
CO2: 25 mmol/L (ref 18–29)
Creatinine, Ser: 1.02 mg/dL (ref 0.76–1.27)
GFR calc Af Amer: 76 mL/min/{1.73_m2} (ref >59)
GFR calc non Af Amer: 66 mL/min/{1.73_m2} (ref >59)
Glucose: 91 mg/dL (ref 65–99)
POTASSIUM: 4.5 mmol/L (ref 3.5–5.2)
SODIUM: 141 mmol/L (ref 134–144)

## 2015-10-24 ENCOUNTER — Ambulatory Visit (INDEPENDENT_AMBULATORY_CARE_PROVIDER_SITE_OTHER): Payer: PPO | Admitting: Pharmacist

## 2015-10-24 ENCOUNTER — Encounter: Payer: Self-pay | Admitting: Pharmacist

## 2015-10-24 ENCOUNTER — Other Ambulatory Visit: Payer: Self-pay | Admitting: *Deleted

## 2015-10-24 VITALS — BP 138/68 | HR 60 | Ht 66.0 in | Wt 143.0 lb

## 2015-10-24 DIAGNOSIS — I700 Atherosclerosis of aorta: Secondary | ICD-10-CM

## 2015-10-24 DIAGNOSIS — Z0000 Encounter for general adult medical examination without abnormal findings: Secondary | ICD-10-CM

## 2015-10-24 NOTE — Patient Instructions (Addendum)
[name] , Thank you for taking time to come for your Medicare Wellness Visit. I appreciate your ongoing commitment to your health goals. Please review the following plan we discussed and let me know if I can assist you in the future.   These are the goals we discussed:  Appointment with Dr Ambrose Finland with [facility] for Tuesday, August 29th at 12:15pm  Increase non-starchy vegetables - carrots, green bean, squash, zucchini, tomatoes, onions, peppers, spinach and other green leafy vegetables, cabbage, lettuce, cucumbers, asparagus, okra (not fried), eggplant Limit sugar and processed foods (cakes, cookies, ice cream, crackers and chips) Increase fresh fruit but limit serving sizes 1/2 cup or about the size of tennis or baseball Limit red meat to no more than 1-2 times per week (serving size about the size of your palm) Choose whole grains / lean proteins - whole wheat bread, quinoa, whole grain rice (1/2 cup), fish, chicken, [place] Avoid sugar and calorie containing beverages - soda, sweet tea and juice.  Choose water or unsweetened tea instead.   This is a list of the screening recommended for you and due dates:  Health Maintenance  Topic Date Due  . Pneumonia vaccines (2 of 2 - PCV13)  not recommended due to history of reaction to pneumonia vaccine in past  . Flu Shot  05/20/2016*  . Tetanus Vaccine  11/06/2024  . Shingles Vaccine  Completed  *Topic was postponed. The date shown is not the original due date.   Health Maintenance, Male A healthy lifestyle and preventative care can promote health and wellness.  Maintain regular health, dental, and eye exams.  Eat a healthy diet. Foods like vegetables, fruits, whole grains, low-fat dairy products, and lean protein foods contain the nutrients you need and are low in calories. Decrease your intake of foods high in solid fats, added sugars, and salt. Get information about a proper diet from your health care provider, if  necessary.  Regular physical exercise is one of the most important things you can do for your health. Most adults should get at least 150 minutes of moderate-intensity exercise (any activity that increases your heart rate and causes you to sweat) each week. In addition, most adults need muscle-strengthening exercises on 2 or more days a week.   Maintain a healthy weight. The body mass index (BMI) is a screening tool to identify possible weight problems. It provides an estimate of body fat based on height and weight. Your health care provider can find your BMI and can help you achieve or maintain a healthy weight. For males 20 years and older:  A BMI below 18.5 is considered underweight.  A BMI of 18.5 to 24.9 is normal.  A BMI of 25 to 29.9 is considered overweight.  A BMI of 30 and above is considered obese.  Maintain normal blood lipids and cholesterol by exercising and minimizing your intake of saturated fat. Eat a balanced diet with plenty of fruits and vegetables. Blood tests for lipids and cholesterol should begin at age 55 and be repeated every 5 years. If your lipid or cholesterol levels are high, you are over age 20, or you are at high risk for heart disease, you may need your cholesterol levels checked more frequently.Ongoing high lipid and cholesterol levels should be treated with medicines if diet and exercise are not working.  If you smoke, find out from your health care provider how to quit. If you do not use tobacco, do not start.  Lung cancer screening is  recommended for adults aged 12-80 years who are at high risk for developing lung cancer because of a history of smoking. A yearly low-dose CT scan of the lungs is recommended for people who have at least a 30-pack-year history of smoking and are current smokers or have quit within the past 15 years. A pack year of smoking is smoking an average of 1 pack of cigarettes a day for 1 year (for example, a 30-pack-year history of  smoking could mean smoking 1 pack a day for 30 years or 2 packs a day for 15 years). Yearly screening should continue until the smoker has stopped smoking for at least 15 years. Yearly screening should be stopped for people who develop a health problem that would prevent them from having lung cancer treatment.  If you choose to drink alcohol, do not have more than 2 drinks per day. One drink is considered to be 12 oz (360 mL) of beer, 5 oz (150 mL) of wine, or 1.5 oz (45 mL) of liquor.  Avoid the use of street drugs. Do not share needles with anyone. Ask for help if you need support or instructions about stopping the use of drugs.  High blood pressure causes heart disease and increases the risk of stroke. High blood pressure is more likely to develop in:  People who have blood pressure in the end of the normal range (100-139/85-89 mm Hg).  People who are overweight or obese.  People who are African American.  If you are 61-13 years of age, have your blood pressure checked every 3-5 years. If you are 72 years of age or older, have your blood pressure checked every year. You should have your blood pressure measured twice--once when you are at a hospital or clinic, and once when you are not at a hospital or clinic. Record the average of the two measurements. To check your blood pressure when you are not at a hospital or clinic, you can use:  An automated blood pressure machine at a pharmacy.  A home blood pressure monitor.  If you are 38-32 years old, ask your health care provider if you should take aspirin to prevent heart disease.  Diabetes screening involves taking a blood sample to check your fasting blood sugar level. This should be done once every 3 years after age 39 if you are at a normal weight and without risk factors for diabetes. Testing should be considered at a younger age or be carried out more frequently if you are overweight and have at least 1 risk factor for  diabetes.  Colorectal cancer can be detected and often prevented. Most routine colorectal cancer screening begins at the age of 46 and continues through age 75. However, your health care provider may recommend screening at an earlier age if you have risk factors for colon cancer. On a yearly basis, your health care provider may provide home test kits to check for hidden blood in the stool. A small camera at the end of a tube may be used to directly examine the colon (sigmoidoscopy or colonoscopy) to detect the earliest forms of colorectal cancer. Talk to your health care provider about this at age 37 when routine screening begins. A direct exam of the colon should be repeated every 5-10 years through age 33, unless early forms of precancerous polyps or small growths are found.  People who are at an increased risk for hepatitis B should be screened for this virus. You are considered at high risk for  hepatitis B if:  You were born in a country where hepatitis B occurs often. Talk with your health care provider about which countries are considered high risk.  Your parents were born in a high-risk country and you have not received a shot to protect against hepatitis B (hepatitis B vaccine).  You have HIV or AIDS.  You use needles to inject street drugs.  You live with, or have sex with, someone who has hepatitis B.  You are a man who has sex with other men (MSM).  You get hemodialysis treatment.  You take certain medicines for conditions like cancer, organ transplantation, and autoimmune conditions.  Hepatitis C blood testing is recommended for all people born from [birth year] through 1963 and any individual with known risk factors for hepatitis C.  Healthy men should no longer receive prostate-specific antigen (PSA) blood tests as part of routine cancer screening. Talk to your health care provider about prostate cancer screening.  Testicular cancer screening is not recommended for adolescents or  adult males who have no symptoms. Screening includes self-exam, a health care provider exam, and other screening tests. Consult with your health care provider about any symptoms you have or any concerns you have about testicular cancer.  Practice safe sex. Use condoms and avoid high-risk sexual practices to reduce the spread of sexually transmitted infections (STIs).  You should be screened for STIs, including gonorrhea and chlamydia if:  You are sexually active and are younger than 24 years.  You are older than 24 years, and your health care provider tells you that you are at risk for this type of infection.  Your sexual activity has changed since you were last screened, and you are at an increased risk for chlamydia or gonorrhea. Ask your health care provider if you are at risk.  If you are at risk of being infected with HIV, it is recommended that you take a prescription medicine daily to prevent HIV infection. This is called pre-exposure prophylaxis (PrEP). You are considered at risk if:  You are a man who has sex with other men (MSM).  You are a heterosexual man who is sexually active with multiple partners.  You take drugs by injection.  You are sexually active with a partner who has HIV.  Talk with your health care provider about whether you are at high risk of being infected with HIV. If you choose to begin PrEP, you should first be tested for HIV. You should then be tested every 3 months for as long as you are taking PrEP.  Use sunscreen. Apply sunscreen liberally and repeatedly throughout the day. You should seek shade when your shadow is shorter than you. Protect yourself by wearing long sleeves, pants, a wide-brimmed hat, and sunglasses year round whenever you are outdoors.  Tell your health care provider of new moles or changes in moles, especially if there is a change in shape or color. Also, tell your health care provider if a mole is larger than the size of a pencil  eraser.  A one-time screening for abdominal aortic aneurysm (AAA) and surgical repair of large AAAs by ultrasound is recommended for men aged 31-75 years who are current or former smokers.  Stay current with your vaccines (immunizations).   This information is not intended to replace advice given to you by your health care provider. Make sure you discuss any questions you have with your health care provider.   Document Released: 08/22/2007 Document Revised: 03/16/2014 Document Reviewed: 07/21/2010 Elsevier Interactive  Patient Education 2016 [employer].

## 2015-10-25 NOTE — Progress Notes (Signed)
Patient ID: Carlos Brooks, male   DOB: [birth year], 82 y.o.   MRN: [id number]    Subjective:   Carlos Brooks is a 82 y.o. male who presents for a sbusequent Medicare Annual Wellness Visit.  Mr. Akers is married. Has 5 grown children. He is a retired Furniture conservator/restorer at [employer] but he still works there from time to time when needed to cover vacations or for special projects.   His main health concern is his hand and hip pain.  He was referred to [facility] but when he called a few days ago he owed a balance and could not make appt.  The balance has since been removed from his record but he has not called back for appt.   Review of Systems  Review of Systems  Constitutional: Negative.   HENT: Negative.   Eyes: Negative.   Respiratory: Negative.   Cardiovascular: Negative.   Gastrointestinal: Negative.   Genitourinary: Negative.   Musculoskeletal: Positive for joint pain (hip and hand  pain) and neck pain.  Skin: Negative.   Neurological: Negative.   Endo/Heme/Allergies: Negative.   Psychiatric/Behavioral: The patient is nervous/anxious (take lorazepam as needed with adequate relief).        Current Medications (verified) Outpatient Encounter Prescriptions as of 10/24/2015  Medication Sig  . albuterol (PROVENTIL) (2.5 MG/3ML) 0.083% nebulizer solution Take 3 mLs (2.5 mg total) by nebulization every 6 (six) hours as needed. Dx: 496  . aspirin 81 MG EC tablet Take 81 mg by mouth daily.    . calcium carbonate (OS-CAL) 600 MG TABS Take 600 mg by mouth 2 (two) times daily with a meal.    . finasteride (PROSCAR) 5 MG tablet Take 5 mg by mouth daily.  . fluticasone (FLONASE) 50 MCG/ACT nasal spray Place 2 sprays into the nose daily.  Marland Kitchen guaiFENesin (MUCINEX) 600 MG 12 hr tablet Take 1,200 mg by mouth 2 (two) times daily.    Marland Kitchen LORazepam (ATIVAN) 0.5 MG tablet TAKE 1 TABLET EVERY DAY AS NEEDED  . meloxicam (MOBIC) 7.5 MG tablet Take 1 tablet (7.5 mg total) by mouth daily.  .  RESTASIS MULTIDOSE 0.05 % ophthalmic emulsion   . terazosin (HYTRIN) 5 MG capsule TAKE ONE CAPSULE EVERY DAY  . Vitamin D, Ergocalciferol, (DRISDOL) 50000 units CAPS capsule TAKE ONE CAPSULE BY MOUTH EVERY 7 DAYS  . azithromycin (ZITHROMAX) 250 MG tablet TAKE 2 TABLETS BY MOUTH TODAY, THEN TAKE 1 TABLET DAILY FOR 4 DAYS   No facility-administered encounter medications on file as of 10/24/2015.     Allergies (verified) Clarithromycin; Crestor [rosuvastatin calcium]; Lipitor [atorvastatin calcium]; Niaspan [niacin er]; Pneumovax [pneumococcal polysaccharide vaccine]; Pravachol; Ranitidine hcl; and Simvastatin   History: Past Medical History:  Diagnosis Date  . BPH (benign prostatic hyperplasia)   . Cancer ([place])    skin  . Cataract   . COPD (chronic obstructive pulmonary disease) ([place])   . Generalized headaches   . GERD (gastroesophageal reflux disease)   . Hiatal hernia   . Hyperlipidemia    Past Surgical History:  Procedure Laterality Date  . EYE SURGERY    . HERNIA REPAIR     x 3, inguinal    Family History  Problem Relation Age of Onset  . Cancer Mother     [name]  . Cancer Father     lung  . Heart attack Brother 64    Not clear details  . Heart attack Son 31    Died with an MI  .  Cancer Brother     liver  . Heart attack Brother 15  . Aneurysm Brother    Social History   Occupational History  . Not on file.   Social History Main Topics  . Smoking status: Former Smoker    Quit date: 04/13/1993  . Smokeless tobacco: Never Used  . Alcohol use No  . Drug use: No  . Sexual activity: No    Do you feel safe at home?  Yes Are there smokers in your home (other than you)? No  Dietary issues and exercise activities discussed: Current Exercise Habits: The patient does not participate in regular exercise at present, Exercise limited by: orthopedic condition(s) (neck and hip pain - appt to see ortho)  Current Dietary habits:  Eats lots of vegetables; lean proteins  mostly.     Cardiac Risk Factors include: advanced age (>9men, >32 women);dyslipidemia;male gender  Objective:    Today's Vitals   10/24/15 1011  BP: 138/68  Pulse: 60  Weight: 143 lb (64.9 kg)  Height: 5\' 6"  (1.676 m)  PainSc: 6   PainLoc: Abdomen   Body mass index is 23.08 kg/m.   Activities of Daily Living In your present state of health, do you have any difficulty performing the following activities: 10/24/2015  Hearing? N  Vision? N  Difficulty concentrating or making decisions? N  Walking or climbing stairs? N  Dressing or bathing? N  Doing errands, shopping? N  Preparing Food and eating ? Y  Using the Toilet? N  In the past six months, have you accidently leaked urine? N  Do you have problems with loss of bowel control? N  Managing your Medications? N  Managing your Finances? N  Housekeeping or managing your Housekeeping? N  Some recent data might be hidden     Depression Screen PHQ 2/9 Scores 10/24/2015 10/21/2015 09/18/2015 06/06/2015  PHQ - 2 Score 0 0 0 0     Fall Risk Fall Risk  10/24/2015 10/21/2015 09/18/2015 06/06/2015 01/15/2015  Falls in the past year? No No No No No    Cognitive Function: MMSE - Mini Mental State Exam 10/24/2015 10/19/2014  Orientation to time 5 5  Orientation to Place 5 5  Registration 3 3  Attention/ Calculation 4 4  Recall 2 3  Language- name 2 objects 2 2  Language- repeat 1 1  Language- follow 3 step command 3 3  Language- read & follow direction 1 1  Write a sentence 1 1  Copy design 1 1  Total score 28 29    Immunizations and Health Maintenance Immunization History  Administered Date(s) Administered  . Influenza Split 12/08/2010, 12/08/2011  . Influenza Whole 12/26/2008  . Influenza,inj,Quad PF,36+ Mos 12/26/2012, 12/21/2014  . Influenza,inj,quad, With Preservative 12/05/2013  . Pneumococcal Polysaccharide-23 03/09/1994  . Td 03/09/2005, 11/07/2014  . Zoster 01/08/2007   There are no preventive care reminders to  display for this patient.  Patient Care Team: Chipper Herb, MD as PCP - General (Family Medicine) Adelina Mings Margret Chance, MD as Consulting Physician (Optometry) Hayden Pedro, MD as Consulting Physician (Ophthalmology) Jarome Matin, MD as Consulting Physician (Dermatology) Minus Breeding, MD as Consulting Physician (Cardiology)  Indicate any recent Medical Services you may have received from other than [facility] providers in the past year (date may be approximate).    Assessment:    Annual Wellness Visit    Screening Tests Health Maintenance  Topic Date Due  . PNA vac Low Risk Adult (2 of 2 -  PCV13) 11/13/2015 (Originally 03/10/1995)  . INFLUENZA VACCINE  05/20/2016 (Originally 10/08/2015)  . TETANUS/TDAP  11/06/2024  . ZOSTAVAX  Completed        Plan:   During the course of the visit Emidio was educated and counseled about the following appropriate screening and preventive services:   Vaccines to include Pneumoccal, Influenza,  Td, Zostavax - UTD except prevnar 13 but since patient has had past adverse rxn to pneumo 23 will not administer prevnar 13.  Reminded to call for influenza vaccine at end of August  Colorectal cancer screening - colonoscopy UTD - patient brouht in FOBT today - results pending  Cardiovascular disease screening - last EKG 2017  Diabetes screening - UTD  Glaucoma screening / Eye Exam - UTD  Nutrition counseling - continue to limit high fat foods and eat lots of vegetables  Called [facility] for patient and helped make appt for August 29th for evaluation of hand pain   Advanced Directives - pt and wife are working on these currently  Physical Activity - pending appt with ortho.  Would not recommend any increase in activity currently but continue to stay active and once pain in hands / hip addressed may be able to start exercise if cleared by ortho.     Patient Instructions (the written plan) were given to the patient.   Cherre Robins,  PharmD   10/25/2015

## 2015-10-28 LAB — FECAL OCCULT BLOOD, IMMUNOCHEMICAL: Fecal Occult Bld: NEGATIVE

## 2015-11-02 ENCOUNTER — Other Ambulatory Visit: Payer: Self-pay | Admitting: Family Medicine

## 2015-11-05 DIAGNOSIS — M19042 Primary osteoarthritis, left hand: Secondary | ICD-10-CM | POA: Diagnosis not present

## 2015-11-20 DIAGNOSIS — J410 Simple chronic bronchitis: Secondary | ICD-10-CM | POA: Diagnosis not present

## 2015-11-20 DIAGNOSIS — K210 Gastro-esophageal reflux disease with esophagitis: Secondary | ICD-10-CM | POA: Diagnosis not present

## 2015-11-20 DIAGNOSIS — M542 Cervicalgia: Secondary | ICD-10-CM | POA: Diagnosis not present

## 2015-11-25 DIAGNOSIS — M19042 Primary osteoarthritis, left hand: Secondary | ICD-10-CM | POA: Diagnosis not present

## 2015-12-02 DIAGNOSIS — M19042 Primary osteoarthritis, left hand: Secondary | ICD-10-CM | POA: Diagnosis not present

## 2015-12-03 DIAGNOSIS — H40033 Anatomical narrow angle, bilateral: Secondary | ICD-10-CM | POA: Diagnosis not present

## 2015-12-03 DIAGNOSIS — H1013 Acute atopic conjunctivitis, bilateral: Secondary | ICD-10-CM | POA: Diagnosis not present

## 2015-12-05 ENCOUNTER — Other Ambulatory Visit: Payer: Self-pay | Admitting: Family Medicine

## 2015-12-05 NOTE — Telephone Encounter (Signed)
Lorazepam last filled 11/04/15. If approved please route to pool and advise

## 2015-12-06 DIAGNOSIS — L820 Inflamed seborrheic keratosis: Secondary | ICD-10-CM | POA: Diagnosis not present

## 2015-12-06 DIAGNOSIS — L821 Other seborrheic keratosis: Secondary | ICD-10-CM | POA: Diagnosis not present

## 2015-12-06 DIAGNOSIS — C44222 Squamous cell carcinoma of skin of right ear and external auricular canal: Secondary | ICD-10-CM | POA: Diagnosis not present

## 2015-12-06 DIAGNOSIS — Z85828 Personal history of other malignant neoplasm of skin: Secondary | ICD-10-CM | POA: Diagnosis not present

## 2015-12-06 DIAGNOSIS — D485 Neoplasm of uncertain behavior of skin: Secondary | ICD-10-CM | POA: Diagnosis not present

## 2015-12-10 ENCOUNTER — Other Ambulatory Visit: Payer: Self-pay | Admitting: Family Medicine

## 2015-12-11 ENCOUNTER — Other Ambulatory Visit: Payer: Self-pay | Admitting: *Deleted

## 2015-12-13 ENCOUNTER — Ambulatory Visit (INDEPENDENT_AMBULATORY_CARE_PROVIDER_SITE_OTHER): Payer: PPO

## 2015-12-13 DIAGNOSIS — Z23 Encounter for immunization: Secondary | ICD-10-CM | POA: Diagnosis not present

## 2015-12-23 ENCOUNTER — Encounter: Payer: Self-pay | Admitting: Pharmacist

## 2015-12-25 ENCOUNTER — Telehealth: Payer: Self-pay

## 2015-12-26 NOTE — Telephone Encounter (Signed)
Pt  Called - this message was for his wife helen.

## 2016-01-02 DIAGNOSIS — G5602 Carpal tunnel syndrome, left upper limb: Secondary | ICD-10-CM | POA: Diagnosis not present

## 2016-01-02 DIAGNOSIS — M19042 Primary osteoarthritis, left hand: Secondary | ICD-10-CM | POA: Diagnosis not present

## 2016-01-15 DIAGNOSIS — M5033 Other cervical disc degeneration, cervicothoracic region: Secondary | ICD-10-CM | POA: Diagnosis not present

## 2016-02-03 ENCOUNTER — Other Ambulatory Visit: Payer: Self-pay | Admitting: Family Medicine

## 2016-02-04 NOTE — Telephone Encounter (Signed)
rx called into pharmacy

## 2016-02-25 ENCOUNTER — Ambulatory Visit (INDEPENDENT_AMBULATORY_CARE_PROVIDER_SITE_OTHER): Payer: PPO | Admitting: Family Medicine

## 2016-02-25 ENCOUNTER — Encounter: Payer: Self-pay | Admitting: Family Medicine

## 2016-02-25 VITALS — BP 138/71 | HR 53 | Temp 96.7°F | Ht 66.0 in | Wt 142.0 lb

## 2016-02-25 DIAGNOSIS — I700 Atherosclerosis of aorta: Secondary | ICD-10-CM

## 2016-02-25 DIAGNOSIS — E559 Vitamin D deficiency, unspecified: Secondary | ICD-10-CM | POA: Diagnosis not present

## 2016-02-25 DIAGNOSIS — J449 Chronic obstructive pulmonary disease, unspecified: Secondary | ICD-10-CM | POA: Diagnosis not present

## 2016-02-25 DIAGNOSIS — E7800 Pure hypercholesterolemia, unspecified: Secondary | ICD-10-CM | POA: Diagnosis not present

## 2016-02-25 DIAGNOSIS — G629 Polyneuropathy, unspecified: Secondary | ICD-10-CM | POA: Diagnosis not present

## 2016-02-25 DIAGNOSIS — N400 Enlarged prostate without lower urinary tract symptoms: Secondary | ICD-10-CM

## 2016-02-25 DIAGNOSIS — J4489 Other specified chronic obstructive pulmonary disease: Secondary | ICD-10-CM

## 2016-02-25 DIAGNOSIS — K219 Gastro-esophageal reflux disease without esophagitis: Secondary | ICD-10-CM

## 2016-02-25 NOTE — Patient Instructions (Addendum)
Medicare Annual Wellness Visit  [facility] and the medical providers at [facility] strive to bring you the best medical care.  In doing so we not only want to address your current medical conditions and concerns but also to detect new conditions early and prevent illness, disease and health-related problems.    Medicare offers a yearly Wellness Visit which allows our clinical staff to assess your need for preventative services including immunizations, lifestyle education, counseling to decrease risk of preventable diseases and screening for fall risk and other medical concerns.    This visit is provided free of charge (no copay) for all Medicare recipients. The clinical pharmacists at [facility] have begun to conduct these Wellness Visits which will also include a thorough review of all your medications.    As you primary medical provider recommend that you make an appointment for your Annual Wellness Visit if you have not done so already this year.  You may set up this appointment before you leave today or you may call back [phone]) and schedule an appointment.  Please make sure when you call that you mention that you are scheduling your Annual Wellness Visit with the clinical pharmacist so that the appointment may be made for the proper length of time.     Continue current medications. Continue good therapeutic lifestyle changes which include good diet and exercise. Fall precautions discussed with patient. If an FOBT was given today- please return it to our front desk. If you are over 80 years old - you may need Prevnar 27 or the adult Pneumonia vaccine.  **Flu shots are available--- please call and schedule a FLU-CLINIC appointment**  After your visit with [place] today you will receive a survey in the mail or online from [employer] regarding your care with [place]. Please take a moment to fill this out. Your feedback is very  important to [place] as you can help [place] better understand your patient needs as well as improve your experience and satisfaction. WE CARE ABOUT YOU!!!   Avoid the use of overhead fans Keep the house as cool as possible Use a cool mist humidifier Use your inhalers regularly Follow-up with the pulmonologist as planned Drink plenty of fluids and stay well hydrated

## 2016-02-25 NOTE — Progress Notes (Signed)
Subjective:    Patient ID: Carlos Brooks, male    DOB: 1926-07-09, 82 y.o.   MRN: [id number]  HPI Pt here for follow up and management of chronic medical problems which includes hyperlipidemia. He is taking medications regularly.     Patient Active Problem List   Diagnosis Date Noted  . Neuropathy ([place]) 10/21/2015  . Thoracic aorta atherosclerosis ([place]) 10/21/2015  . Dyspnea 08/07/2015  . DYSPHAGIA UNSPECIFIED 06/20/2007  . Allergic rhinitis 04/28/2007  . LUNG NODULE 04/28/2007  . Hyperlipidemia LDL goal <100 03/28/2007  . COPD (chronic obstructive pulmonary disease) with chronic bronchitis ([place]) 03/28/2007  . GERD 03/28/2007  . BPH (benign prostatic hyperplasia) 03/28/2007   Outpatient Encounter Prescriptions as of 02/25/2016  Medication Sig  . albuterol (PROVENTIL) (2.5 MG/3ML) 0.083% nebulizer solution Take 3 mLs (2.5 mg total) by nebulization every 6 (six) hours as needed. Dx: 496  . aspirin 81 MG EC tablet Take 81 mg by mouth daily.    Marland Kitchen azithromycin (ZITHROMAX) 250 MG tablet TAKE 2 TABLETS BY MOUTH TODAY, THEN TAKE 1 TABLET DAILY FOR 4 DAYS  . calcium carbonate (OS-CAL) 600 MG TABS Take 600 mg by mouth 2 (two) times daily with a meal.    . finasteride (PROSCAR) 5 MG tablet TAKE 1 TABLET DAILY  . fluticasone (FLONASE) 50 MCG/ACT nasal spray Place 2 sprays into the nose daily.  Marland Kitchen guaiFENesin (MUCINEX) 600 MG 12 hr tablet Take 1,200 mg by mouth 2 (two) times daily.    Marland Kitchen LORazepam (ATIVAN) 0.5 MG tablet TAKE 1 TABLET EVERY DAY AS NEEDED  . RESTASIS MULTIDOSE 0.05 % ophthalmic emulsion   . terazosin (HYTRIN) 5 MG capsule TAKE ONE CAPSULE EVERY DAY  . Vitamin D, Ergocalciferol, (DRISDOL) 50000 units CAPS capsule TAKE ONE CAPSULE BY MOUTH EVERY 7 DAYS  . [DISCONTINUED] finasteride (PROSCAR) 5 MG tablet Take 5 mg by mouth daily.  . [DISCONTINUED] meloxicam (MOBIC) 7.5 MG tablet Take 1 tablet (7.5 mg total) by mouth daily.   No facility-administered encounter medications on  file as of 02/25/2016.      Review of Systems  Constitutional: Negative.   HENT: Negative.   Eyes: Negative.   Respiratory: Negative.   Cardiovascular: Negative.   Gastrointestinal: Negative.   Endocrine: Negative.   Genitourinary: Negative.   Musculoskeletal: Negative.   Skin: Negative.   Allergic/Immunologic: Negative.   Neurological: Negative.   Hematological: Negative.   Psychiatric/Behavioral: Negative.        Objective:   Physical Exam  Constitutional: He is oriented to person, place, and time. He appears well-developed and well-nourished. No distress.  The patient is pleasant and alert with no specific complaints. He is followed also by the pulmonologist.  HENT:  Head: Normocephalic and atraumatic.  Right Ear: External ear normal.  Left Ear: External ear normal.  Nose: Nose normal.  Mouth/Throat: Oropharynx is clear and moist. No oropharyngeal exudate.  Eyes: Conjunctivae and EOM are normal. Pupils are equal, round, and reactive to light. Right eye exhibits no discharge. Left eye exhibits no discharge. No scleral icterus.  Neck: Normal range of motion. Neck supple. No thyromegaly present.  No bruits thyromegaly or anterior cervical adenopathy  Cardiovascular: Normal rate, regular rhythm, normal heart sounds and intact distal pulses.   No murmur heard. The heart was regular at 60/m  Pulmonary/Chest: Effort normal. No respiratory distress. He has no wheezes. He has no rales.  Occasional wheezes. Slightly tight cough.  Abdominal: Soft. Bowel sounds are normal. He exhibits no mass. There  is no tenderness. There is no rebound and no guarding.  No liver or spleen enlargement no bruits and no inguinal adenopathy  Genitourinary: Rectum normal and penis normal.  Genitourinary Comments: The prostate is enlarged. There were no rectal masses. There are no inguinal hernias. External genitalia were within normal limits.  Musculoskeletal: Normal range of motion. He exhibits no  edema.  Lymphadenopathy:    He has no cervical adenopathy.  Neurological: He is alert and oriented to person, place, and time. He has normal reflexes. No cranial nerve deficit.  Skin: Skin is warm and dry. No rash noted.  The skin is dry in general.  Psychiatric: He has a normal mood and affect. His behavior is normal. Judgment and thought content normal.  Nursing note and vitals reviewed.  BP 138/71 (BP Location: Left Arm)   Pulse (!) 53   Temp 97.1 F (36.2 C) (Oral)   Ht 5' 6"  (1.676 m)   Wt 142 lb (64.4 kg)   BMI 22.92 kg/m         Assessment & Plan:  1. Pure hypercholesterolemia -Continue aggressive therapeutic lifestyle changes - BMP8+EGFR - CBC with Differential/Platelet - Hepatic function panel - Lipid panel  2. Vitamin D deficiency -Continue current treatment pending results of lab work - CBC with Differential/Platelet - VITAMIN D 25 Hydroxy (Vit-D Deficiency, Fractures)  3. Thoracic aorta atherosclerosis ([place]) -Continue aggressive therapeutic lifestyle changes which include diet and exercise as much as possible - CBC with Differential/Platelet - Lipid panel  4. Gastroesophageal reflux disease, esophagitis presence not specified -Avoid NSAIDs as much as possible and take Tylenol if needed for pain - CBC with Differential/Platelet  5. Benign prostatic hyperplasia, unspecified whether lower urinary tract symptoms present -The patient continues to have an enlarged prostate and the rectal exam was negative and he should continue with his pro scar and Hytrin. - CBC with Differential/Platelet - Urinalysis, Complete  6. Neuropathy ([place]) -No complaints with this today.  7. COPD (chronic obstructive pulmonary disease) with chronic bronchitis (HCC) *-Continue inhalers and drinking plenty of fluids and keeping the house as cool as possible and avoiding the use of the overhead fan  Patient Instructions                       Medicare Annual Wellness Visit  [facility] and the medical providers at [facility] strive to bring you the best medical care.  In doing so we not only want to address your current medical conditions and concerns but also to detect new conditions early and prevent illness, disease and health-related problems.    Medicare offers a yearly Wellness Visit which allows our clinical staff to assess your need for preventative services including immunizations, lifestyle education, counseling to decrease risk of preventable diseases and screening for fall risk and other medical concerns.    This visit is provided free of charge (no copay) for all Medicare recipients. The clinical pharmacists at [facility] have begun to conduct these Wellness Visits which will also include a thorough review of all your medications.    As you primary medical provider recommend that you make an appointment for your Annual Wellness Visit if you have not done so already this year.  You may set up this appointment before you leave today or you may call back ([phone]) and schedule an appointment.  Please make sure when you call that you mention that you are scheduling your Annual Wellness Visit  with the clinical pharmacist so that the appointment may be made for the proper length of time.     Continue current medications. Continue good therapeutic lifestyle changes which include good diet and exercise. Fall precautions discussed with patient. If an FOBT was given today- please return it to our front desk. If you are over 38 years old - you may need Prevnar 47 or the adult Pneumonia vaccine.  **Flu shots are available--- please call and schedule a FLU-CLINIC appointment**  After your visit with [place] today you will receive a survey in the mail or online from [employer] regarding your care with [place]. Please take a moment to fill this out. Your feedback is very important to [place] as you can help [place] better understand your patient  needs as well as improve your experience and satisfaction. WE CARE ABOUT YOU!!!   Avoid the use of overhead fans Keep the house as cool as possible Use a cool mist humidifier Use your inhalers regularly Follow-up with the pulmonologist as planned Drink plenty of fluids and stay well hydrated    Arrie Senate MD

## 2016-03-09 ENCOUNTER — Other Ambulatory Visit: Payer: Self-pay | Admitting: Family Medicine

## 2016-03-13 DIAGNOSIS — D485 Neoplasm of uncertain behavior of skin: Secondary | ICD-10-CM | POA: Diagnosis not present

## 2016-03-13 DIAGNOSIS — Z85828 Personal history of other malignant neoplasm of skin: Secondary | ICD-10-CM | POA: Diagnosis not present

## 2016-03-13 DIAGNOSIS — C44519 Basal cell carcinoma of skin of other part of trunk: Secondary | ICD-10-CM | POA: Diagnosis not present

## 2016-03-13 DIAGNOSIS — L821 Other seborrheic keratosis: Secondary | ICD-10-CM | POA: Diagnosis not present

## 2016-04-07 ENCOUNTER — Other Ambulatory Visit: Payer: Self-pay | Admitting: Family

## 2016-05-04 ENCOUNTER — Other Ambulatory Visit: Payer: Self-pay | Admitting: Family Medicine

## 2016-06-03 ENCOUNTER — Other Ambulatory Visit: Payer: Self-pay | Admitting: Family Medicine

## 2016-06-03 NOTE — Telephone Encounter (Signed)
Last filled 05/06/16, last seen 02/25/16. Call in

## 2016-06-18 DIAGNOSIS — N401 Enlarged prostate with lower urinary tract symptoms: Secondary | ICD-10-CM | POA: Diagnosis not present

## 2016-06-18 DIAGNOSIS — J411 Mucopurulent chronic bronchitis: Secondary | ICD-10-CM | POA: Diagnosis not present

## 2016-06-18 DIAGNOSIS — R079 Chest pain, unspecified: Secondary | ICD-10-CM | POA: Diagnosis not present

## 2016-06-18 DIAGNOSIS — K210 Gastro-esophageal reflux disease with esophagitis: Secondary | ICD-10-CM | POA: Diagnosis not present

## 2016-07-15 ENCOUNTER — Ambulatory Visit: Payer: PPO | Admitting: Family Medicine

## 2016-08-04 ENCOUNTER — Other Ambulatory Visit: Payer: Self-pay | Admitting: Family Medicine

## 2016-08-07 ENCOUNTER — Other Ambulatory Visit: Payer: PPO

## 2016-08-07 DIAGNOSIS — E7800 Pure hypercholesterolemia, unspecified: Secondary | ICD-10-CM

## 2016-08-07 DIAGNOSIS — E559 Vitamin D deficiency, unspecified: Secondary | ICD-10-CM | POA: Diagnosis not present

## 2016-08-07 DIAGNOSIS — I700 Atherosclerosis of aorta: Secondary | ICD-10-CM | POA: Diagnosis not present

## 2016-08-07 DIAGNOSIS — K219 Gastro-esophageal reflux disease without esophagitis: Secondary | ICD-10-CM

## 2016-08-08 LAB — BMP8+EGFR
BUN / CREAT RATIO: 19 (ref 10–24)
BUN: 20 mg/dL (ref 8–27)
CHLORIDE: 103 mmol/L (ref 96–106)
CO2: 25 mmol/L (ref 18–29)
CREATININE: 1.07 mg/dL (ref 0.76–1.27)
Calcium: 9.6 mg/dL (ref 8.6–10.2)
GFR calc non Af Amer: 62 mL/min/{1.73_m2} (ref >59)
GFR, EST AFRICAN AMERICAN: 71 mL/min/{1.73_m2} (ref >59)
Glucose: 93 mg/dL (ref 65–99)
Potassium: 4.4 mmol/L (ref 3.5–5.2)
Sodium: 142 mmol/L (ref 134–144)

## 2016-08-08 LAB — CBC WITH DIFFERENTIAL/PLATELET
BASOS: 1 %
Basophils Absolute: 0.1 10*3/uL (ref 0.0–0.2)
EOS (ABSOLUTE): 0.8 10*3/uL — AB (ref 0.0–0.4)
Eos: 10 %
Hematocrit: 42.8 % (ref 37.5–51.0)
Hemoglobin: 14.6 g/dL (ref 13.0–17.7)
IMMATURE GRANS (ABS): 0 10*3/uL (ref 0.0–0.1)
Immature Granulocytes: 0 %
LYMPHS: 29 %
Lymphocytes Absolute: 2.2 10*3/uL (ref 0.7–3.1)
MCH: 30.9 pg (ref 26.6–33.0)
MCHC: 34.1 g/dL (ref 31.5–35.7)
MCV: 91 fL (ref 79–97)
MONOS ABS: 0.8 10*3/uL (ref 0.1–0.9)
Monocytes: 11 %
NEUTROS ABS: 3.6 10*3/uL (ref 1.4–7.0)
Neutrophils: 49 %
PLATELETS: 158 10*3/uL (ref 150–379)
RBC: 4.72 x10E6/uL (ref 4.14–5.80)
RDW: 14.7 % (ref 12.3–15.4)
WBC: 7.4 10*3/uL (ref 3.4–10.8)

## 2016-08-08 LAB — HEPATIC FUNCTION PANEL
ALK PHOS: 49 IU/L (ref 39–117)
ALT: 11 IU/L (ref 0–44)
AST: 17 IU/L (ref 0–40)
Albumin: 4.5 g/dL (ref 3.5–4.7)
BILIRUBIN, DIRECT: 0.14 mg/dL (ref 0.00–0.40)
Bilirubin Total: 0.5 mg/dL (ref 0.0–1.2)
Total Protein: 6.6 g/dL (ref 6.0–8.5)

## 2016-08-08 LAB — LIPID PANEL
Chol/HDL Ratio: 4 ratio (ref 0.0–5.0)
Cholesterol, Total: 195 mg/dL (ref 100–199)
HDL: 49 mg/dL (ref >39)
LDL Calculated: 121 mg/dL — ABNORMAL HIGH (ref 0–99)
Triglycerides: 127 mg/dL (ref 0–149)
VLDL Cholesterol Cal: 25 mg/dL (ref 5–40)

## 2016-08-08 LAB — VITAMIN D 25 HYDROXY (VIT D DEFICIENCY, FRACTURES): VIT D 25 HYDROXY: 43.4 ng/mL (ref 30.0–100.0)

## 2016-08-10 ENCOUNTER — Encounter: Payer: Self-pay | Admitting: Family Medicine

## 2016-08-10 ENCOUNTER — Ambulatory Visit (INDEPENDENT_AMBULATORY_CARE_PROVIDER_SITE_OTHER): Payer: PPO

## 2016-08-10 ENCOUNTER — Ambulatory Visit (INDEPENDENT_AMBULATORY_CARE_PROVIDER_SITE_OTHER): Payer: PPO | Admitting: Family Medicine

## 2016-08-10 VITALS — BP 130/69 | HR 72 | Temp 97.4°F | Ht 66.0 in | Wt 140.1 lb

## 2016-08-10 DIAGNOSIS — I700 Atherosclerosis of aorta: Secondary | ICD-10-CM

## 2016-08-10 DIAGNOSIS — E785 Hyperlipidemia, unspecified: Secondary | ICD-10-CM | POA: Diagnosis not present

## 2016-08-10 DIAGNOSIS — K219 Gastro-esophageal reflux disease without esophagitis: Secondary | ICD-10-CM

## 2016-08-10 DIAGNOSIS — G629 Polyneuropathy, unspecified: Secondary | ICD-10-CM | POA: Diagnosis not present

## 2016-08-10 DIAGNOSIS — E559 Vitamin D deficiency, unspecified: Secondary | ICD-10-CM

## 2016-08-10 DIAGNOSIS — J449 Chronic obstructive pulmonary disease, unspecified: Secondary | ICD-10-CM | POA: Diagnosis not present

## 2016-08-10 DIAGNOSIS — M549 Dorsalgia, unspecified: Secondary | ICD-10-CM | POA: Diagnosis not present

## 2016-08-10 DIAGNOSIS — N400 Enlarged prostate without lower urinary tract symptoms: Secondary | ICD-10-CM | POA: Diagnosis not present

## 2016-08-10 DIAGNOSIS — M150 Primary generalized (osteo)arthritis: Secondary | ICD-10-CM

## 2016-08-10 DIAGNOSIS — M159 Polyosteoarthritis, unspecified: Secondary | ICD-10-CM

## 2016-08-10 DIAGNOSIS — M542 Cervicalgia: Secondary | ICD-10-CM | POA: Insufficient documentation

## 2016-08-10 NOTE — Patient Instructions (Addendum)
Continue current medications. Continue good therapeutic lifestyle changes which include good diet and exercise. Fall precautions discussed with patient. If an FOBT was given today- please return it to our front desk. If you are over 81 years old - you may need Prevnar 12 or the adult Pneumonia vaccine.   After your visit with [place] today you will receive a survey in the mail or online from [employer] regarding your care with [place]. Please take a moment to fill this out. Your feedback is very important to [place] as you can help [place] better understand your patient needs as well as improve your experience and satisfaction. WE CARE ABOUT YOU!!!   We will schedule you to see the orthopedic surgeon regarding her neck pain and neuropathy We will also have him look at your back and the neuropathy that you're having in your feet Stay active but remember your age and drink plenty of fluids and stay well hydrated Avoid any climbing unless absolutely necessary Follow-up with the pulmonologist as planned The swallowing issues get worse get back in touch with [place]

## 2016-08-10 NOTE — Progress Notes (Signed)
Subjective:    Patient ID: Carlos Brooks, male    DOB: [birth year], 83 y.o.   MRN: [id number]  HPI  Carlos Brooks is here today for a 4 month follow up on hypertension, hyperlipidemia, GERD and Vitamin D deficiency with no complaints.The patient comes to the visit today for regular follow-up. He has no specific complaints. Has a history of BPH atherosclerosis of the aorta and chronic obstructive pulmonary disease and reflux as well as hyperlipidemia. He is due to get a chest x-ray and we will review his lab work with him during the visit. On his cholesterol his LDL C remains elevated at 121. The patient has been intolerant to statin drugs in the past and we'll have to continue with as aggressive therapeutic lifestyle changes as possible. His HDL is good and his triglycerides are within normal limits. CBC has a normal white blood cell count a good hemoglobin at 14.6 with adequate platelet count. The blood sugar was good at 93 and a creatinine and electrolytes were within normal limits. All liver function tests were good. Vitamin D level was also good at 43.4. The patient has COPD and does see Dr. Luan Pulling periodically. He says he has some shortness of breath at nighttime periodically and occasionally uses his inhalers but not regularly. He denies any chest pain related to exertion. He does have some facial pain and neck pain and has had shots in his neck in the past from the orthopedic surgeon. He also has some trouble swallowing at times and something seems to get hung up in his throat he specifically mentioned popcorn. He has not seen any blood in the stool or had any black tarry bowel movements. He says he is slow to start his urine whenever he starts to void. As mentioned he does see the pulmonologist periodically.       Patient Active Problem List   Diagnosis Date Noted  . Neuropathy 10/21/2015  . Thoracic aorta atherosclerosis ([place]) 10/21/2015  . Dyspnea 08/07/2015  . DYSPHAGIA UNSPECIFIED  06/20/2007  . Allergic rhinitis 04/28/2007  . LUNG NODULE 04/28/2007  . Hyperlipidemia LDL goal <100 03/28/2007  . COPD (chronic obstructive pulmonary disease) with chronic bronchitis ([place]) 03/28/2007  . GERD 03/28/2007  . BPH (benign prostatic hyperplasia) 03/28/2007   Outpatient Encounter Prescriptions as of 08/10/2016  Medication Sig  . albuterol (PROVENTIL) (2.5 MG/3ML) 0.083% nebulizer solution Take 3 mLs (2.5 mg total) by nebulization every 6 (six) hours as needed. Dx: 496  . aspirin 81 MG EC tablet Take 81 mg by mouth daily.    . calcium carbonate (OS-CAL) 600 MG TABS Take 600 mg by mouth 2 (two) times daily with a meal.    . finasteride (PROSCAR) 5 MG tablet TAKE 1 TABLET EVERY DAY  . fluticasone (FLONASE) 50 MCG/ACT nasal spray Place 2 sprays into the nose daily.  Marland Kitchen guaiFENesin (MUCINEX) 600 MG 12 hr tablet Take 1,200 mg by mouth 2 (two) times daily.    Marland Kitchen LORazepam (ATIVAN) 0.5 MG tablet TAKE 1 TABLET BY MOUTH EVERY DAY AS NEEDED  . RESTASIS MULTIDOSE 0.05 % ophthalmic emulsion   . terazosin (HYTRIN) 5 MG capsule TAKE 1 CAPSULE EVERY DAY  . Vitamin D, Ergocalciferol, (DRISDOL) 50000 units CAPS capsule TAKE ONE CAPSULE BY MOUTH EVERY WEEK  . [DISCONTINUED] azithromycin (ZITHROMAX) 250 MG tablet TAKE 2 TABLETS BY MOUTH TODAY, THEN TAKE 1 TABLET DAILY FOR 4 DAYS   No facility-administered encounter medications on file as of 08/10/2016.  Review of Systems  Constitutional: Negative.   HENT: Negative.   Eyes: Negative.   Respiratory: Negative.   Cardiovascular: Negative.   Gastrointestinal: Negative.   Endocrine: Negative.   Genitourinary: Negative.   Musculoskeletal: Negative.   Skin: Negative.   Allergic/Immunologic: Negative.   Neurological: Negative.   Hematological: Negative.   Psychiatric/Behavioral: Negative.        Objective:   Physical Exam  Constitutional: He is oriented to person, place, and time. He appears well-developed and well-nourished. No distress.    The patient is pleasant and alert  HENT:  Head: Normocephalic and atraumatic.  Right Ear: External ear normal.  Left Ear: External ear normal.  Nose: Nose normal.  Mouth/Throat: Oropharynx is clear and moist. No oropharyngeal exudate.  Eyes: Conjunctivae and EOM are normal. Pupils are equal, round, and reactive to light. Right eye exhibits no discharge. Left eye exhibits no discharge. No scleral icterus.  Neck: Normal range of motion. Neck supple. No thyromegaly present.  No bruits thyromegaly or anterior cervical adenopathy  Cardiovascular: Normal rate, regular rhythm, normal heart sounds and intact distal pulses.   No murmur heard. Heart is regular at 72/m  Pulmonary/Chest: Effort normal. No respiratory distress. He has wheezes. He has no rales. He exhibits no tenderness.  Axillary negative for adenopathy Expiratory wheezes right greater than left  Abdominal: Soft. Bowel sounds are normal. He exhibits no mass. There is tenderness. There is no rebound and no guarding.  Slight lower abdominal tenderness  Musculoskeletal: He exhibits no edema or tenderness.  Range of motion is limited with his neck.  Lymphadenopathy:    He has no cervical adenopathy.  Neurological: He is alert and oriented to person, place, and time. He has normal reflexes. No cranial nerve deficit.  Skin: Skin is warm and dry. No rash noted.  Psychiatric: He has a normal mood and affect. His behavior is normal. Judgment and thought content normal.  Nursing note and vitals reviewed.  BP 130/69   Pulse 72   Temp 97.4 F (36.3 C) (Oral)   Ht 5\' 6"  (1.676 m)   Wt 140 lb 1.6 oz (63.5 kg)   BMI 22.61 kg/m         Assessment & Plan:  1. Hyperlipidemia LDL goal <100 -Continue aggressive therapeutic lifestyle changes  2. COPD (chronic obstructive pulmonary disease) with chronic bronchitis ([place]) -Follow-up with pulmonology as planned - DG Chest 2 View; Future  3. Hyperlipidemia, unspecified hyperlipidemia  type -Continue aggressive therapeutic lifestyle changes and he is intolerant to statins.  4. Gastroesophageal reflux disease, esophagitis presence not specified -The patient still has some issues with swallowing at times and will try to avoid irritating foods.  5. Vitamin D deficiency -Continue current treatment  6. Benign prostatic hyperplasia, unspecified whether lower urinary tract symptoms present -The patient voiding symptoms are that he has trouble with a slow stream.  7. Thoracic aorta atherosclerosis ([place]) -Continue with as aggressive therapeutic lifestyle changes as possible  8. Neuropathy - AMB referral to orthopedics  9. Neck pain - AMB referral to orthopedics  10. Back pain, unspecified back location, unspecified back pain laterality, unspecified chronicity - AMB referral to orthopedics  11. Osteoarthritis multiple joints -The patient has been a Games developer for many years and has a lot of rigidity in his neck and numbness and tingling in his lower extremities.  Patient Instructions  Continue current medications. Continue good therapeutic lifestyle changes which include good diet and exercise. Fall precautions discussed with patient. If an FOBT was  given today- please return it to our front desk. If you are over 30 years old - you may need Prevnar 39 or the adult Pneumonia vaccine.   After your visit with [place] today you will receive a survey in the mail or online from [employer] regarding your care with [place]. Please take a moment to fill this out. Your feedback is very important to [place] as you can help [place] better understand your patient needs as well as improve your experience and satisfaction. WE CARE ABOUT YOU!!!   We will schedule you to see the orthopedic surgeon regarding her neck pain and neuropathy We will also have him look at your back and the neuropathy that you're having in your feet Stay active but remember your age and drink plenty of fluids and stay well  hydrated Avoid any climbing unless absolutely necessary Follow-up with the pulmonologist as planned The swallowing issues get worse get back in touch with [place]  Arrie Senate MD

## 2016-09-01 DIAGNOSIS — M5033 Other cervical disc degeneration, cervicothoracic region: Secondary | ICD-10-CM | POA: Diagnosis not present

## 2016-09-03 ENCOUNTER — Other Ambulatory Visit: Payer: Self-pay | Admitting: Family Medicine

## 2016-09-18 DIAGNOSIS — L821 Other seborrheic keratosis: Secondary | ICD-10-CM | POA: Diagnosis not present

## 2016-09-18 DIAGNOSIS — Z85828 Personal history of other malignant neoplasm of skin: Secondary | ICD-10-CM | POA: Diagnosis not present

## 2016-09-18 DIAGNOSIS — D692 Other nonthrombocytopenic purpura: Secondary | ICD-10-CM | POA: Diagnosis not present

## 2016-09-18 DIAGNOSIS — L570 Actinic keratosis: Secondary | ICD-10-CM | POA: Diagnosis not present

## 2016-09-18 DIAGNOSIS — L820 Inflamed seborrheic keratosis: Secondary | ICD-10-CM | POA: Diagnosis not present

## 2016-09-18 DIAGNOSIS — D1801 Hemangioma of skin and subcutaneous tissue: Secondary | ICD-10-CM | POA: Diagnosis not present

## 2016-10-16 DIAGNOSIS — H43393 Other vitreous opacities, bilateral: Secondary | ICD-10-CM | POA: Diagnosis not present

## 2016-10-16 DIAGNOSIS — H40033 Anatomical narrow angle, bilateral: Secondary | ICD-10-CM | POA: Diagnosis not present

## 2016-10-19 ENCOUNTER — Encounter: Payer: Self-pay | Admitting: *Deleted

## 2016-10-28 ENCOUNTER — Ambulatory Visit: Payer: Self-pay | Admitting: *Deleted

## 2016-10-28 ENCOUNTER — Ambulatory Visit: Payer: Self-pay | Admitting: Pharmacist

## 2016-11-02 ENCOUNTER — Encounter: Payer: Self-pay | Admitting: *Deleted

## 2016-11-04 ENCOUNTER — Other Ambulatory Visit: Payer: Self-pay | Admitting: Family Medicine

## 2016-11-06 DIAGNOSIS — Z85828 Personal history of other malignant neoplasm of skin: Secondary | ICD-10-CM | POA: Diagnosis not present

## 2016-11-06 DIAGNOSIS — L239 Allergic contact dermatitis, unspecified cause: Secondary | ICD-10-CM | POA: Diagnosis not present

## 2016-11-27 ENCOUNTER — Other Ambulatory Visit: Payer: Self-pay | Admitting: Family Medicine

## 2016-11-30 ENCOUNTER — Ambulatory Visit: Payer: Self-pay | Admitting: *Deleted

## 2016-12-11 DIAGNOSIS — Z85828 Personal history of other malignant neoplasm of skin: Secondary | ICD-10-CM | POA: Diagnosis not present

## 2016-12-11 DIAGNOSIS — L738 Other specified follicular disorders: Secondary | ICD-10-CM | POA: Diagnosis not present

## 2016-12-15 ENCOUNTER — Encounter: Payer: Self-pay | Admitting: Family Medicine

## 2016-12-15 ENCOUNTER — Ambulatory Visit (INDEPENDENT_AMBULATORY_CARE_PROVIDER_SITE_OTHER): Payer: PPO | Admitting: Family Medicine

## 2016-12-15 VITALS — BP 138/67 | HR 47 | Temp 96.8°F | Ht 66.0 in | Wt 138.0 lb

## 2016-12-15 DIAGNOSIS — Z23 Encounter for immunization: Secondary | ICD-10-CM

## 2016-12-15 DIAGNOSIS — E785 Hyperlipidemia, unspecified: Secondary | ICD-10-CM

## 2016-12-15 DIAGNOSIS — J449 Chronic obstructive pulmonary disease, unspecified: Secondary | ICD-10-CM | POA: Diagnosis not present

## 2016-12-15 DIAGNOSIS — K219 Gastro-esophageal reflux disease without esophagitis: Secondary | ICD-10-CM | POA: Diagnosis not present

## 2016-12-15 DIAGNOSIS — I700 Atherosclerosis of aorta: Secondary | ICD-10-CM | POA: Diagnosis not present

## 2016-12-15 DIAGNOSIS — E559 Vitamin D deficiency, unspecified: Secondary | ICD-10-CM

## 2016-12-15 DIAGNOSIS — J4489 Other specified chronic obstructive pulmonary disease: Secondary | ICD-10-CM

## 2016-12-15 NOTE — Progress Notes (Signed)
Subjective:    Patient ID: Carlos Brooks, male    DOB: 1926/12/11, 83 y.o.   MRN: [id number]  HPI Pt here for follow up and management of chronic medical problems which includes hyperlipidemia and COPD. He is taking medication regularly. The patient today complains of some congestion and cough. He had an infection or lesion on his back which he would like for [place] to follow-up on. He would like to get his flu shot today and we will determine that after doing his exam. He is due to return an FOBT and will get lab work. His vital signs are stable his weight is down a couple of pounds. The patient does plan to see his pulmonologist in a couple weeks. He has had some left-sided pain and this was when he was not physically doing anything. Since that time he's been active physically with no pain. The patient denies any more shortness of breath than usual. He says that the inhaler given to him by his pulmonologist has helped him a lot and he does not use his rescue inhaler. He denies any trouble with his stomach and attributes this to taking his probiotic daily. He's passing his water without problems just goes frequently. He does take his Mucinex regularly. I encouraged him to keep doing this.    Patient Active Problem List   Diagnosis Date Noted  . Primary osteoarthritis involving multiple joints 08/10/2016  . Back pain 08/10/2016  . Neck pain 08/10/2016  . Neuropathy 10/21/2015  . Aortic atherosclerosis ([place]) 10/21/2015  . Dyspnea 08/07/2015  . DYSPHAGIA UNSPECIFIED 06/20/2007  . Allergic rhinitis 04/28/2007  . LUNG NODULE 04/28/2007  . Hyperlipidemia LDL goal <100 03/28/2007  . COPD (chronic obstructive pulmonary disease) with chronic bronchitis ([place]) 03/28/2007  . GERD 03/28/2007  . BPH (benign prostatic hyperplasia) 03/28/2007   Outpatient Encounter Prescriptions as of 12/15/2016  Medication Sig  . albuterol (PROVENTIL) (2.5 MG/3ML) 0.083% nebulizer solution Take 3 mLs (2.5 mg total) by  nebulization every 6 (six) hours as needed. Dx: 496  . aspirin 81 MG EC tablet Take 81 mg by mouth daily.    . calcium carbonate (OS-CAL) 600 MG TABS Take 600 mg by mouth 2 (two) times daily with a meal.    . finasteride (PROSCAR) 5 MG tablet TAKE 1 TABLET EVERY DAY  . fluticasone (FLONASE) 50 MCG/ACT nasal spray Place 2 sprays into the nose daily.  Marland Kitchen guaiFENesin (MUCINEX) 600 MG 12 hr tablet Take 1,200 mg by mouth 2 (two) times daily.    Marland Kitchen LORazepam (ATIVAN) 0.5 MG tablet TAKE 1 TABLET DAILY AS NEEDED  . RESTASIS MULTIDOSE 0.05 % ophthalmic emulsion   . terazosin (HYTRIN) 5 MG capsule TAKE 1 CAPSULE EVERY DAY  . Vitamin D, Ergocalciferol, (DRISDOL) 50000 units CAPS capsule TAKE ONE CAPSULE BY MOUTH ONE TIME PER WEEK   No facility-administered encounter medications on file as of 12/15/2016.       Review of Systems  Constitutional: Negative.   HENT: Positive for congestion.   Eyes: Negative.   Respiratory: Positive for cough.   Cardiovascular: Negative.   Gastrointestinal: Negative.   Endocrine: Negative.   Genitourinary: Negative.   Musculoskeletal: Negative.   Skin: Negative.        lesion on back - check for infection   Allergic/Immunologic: Negative.   Neurological: Negative.   Hematological: Negative.   Psychiatric/Behavioral: Negative.        Objective:   Physical Exam  Constitutional: He is oriented to person, place,  and time. He appears well-developed and well-nourished. No distress.  The patient is 82 years old but is staying active and alert with some speech hesitancy.  HENT:  Head: Normocephalic and atraumatic.  Right Ear: External ear normal.  Left Ear: External ear normal.  Nose: Nose normal.  Mouth/Throat: Oropharynx is clear and moist. No oropharyngeal exudate.  Eyes: Pupils are equal, round, and reactive to light. Conjunctivae and EOM are normal. Right eye exhibits no discharge. Left eye exhibits no discharge. No scleral icterus.  Neck: Normal range of  motion. Neck supple. No thyromegaly present.  No bruits thyromegaly or anterior cervical adenopathy  Cardiovascular: Normal rate, regular rhythm, normal heart sounds and intact distal pulses.   No murmur heard. The heart is regular at 60/m  Pulmonary/Chest: Effort normal and breath sounds normal. No respiratory distress. He has no wheezes. He has no rales. He exhibits no tenderness.  No axillary adenopathy Clear anteriorly and posteriorly and slight congestion with coughing  Abdominal: Soft. Bowel sounds are normal. He exhibits no mass. There is no tenderness. There is no rebound and no guarding.  No abdominal tenderness masses or organ enlargement or bruits  Musculoskeletal: Normal range of motion. He exhibits no edema.  Lymphadenopathy:    He has no cervical adenopathy.  Neurological: He is alert and oriented to person, place, and time. He has normal reflexes. No cranial nerve deficit.  Skin: Skin is warm and dry. No rash noted.  Psychiatric: He has a normal mood and affect. His behavior is normal. Judgment and thought content normal.  Nursing note and vitals reviewed.  BP 138/67 (BP Location: Left Arm)   Pulse (!) 47   Temp (!) 96.8 F (36 C) (Oral)   Ht 5' 6"  (1.676 m)   Wt 138 lb (62.6 kg)   BMI 22.27 kg/m         Assessment & Plan:  1. COPD (chronic obstructive pulmonary disease) with chronic bronchitis (HCC) -Continue inhalers and follow-up with Dr. Luan Pulling as planned - CBC with Differential/Platelet  2. Hyperlipidemia LDL goal <100 -Continue with as aggressive therapeutic lifestyle changes as possible - CBC with Differential/Platelet - Lipid panel  3. Gastroesophageal reflux disease, esophagitis presence not specified -Continue with anti-reflux measures and diet - CBC with Differential/Platelet - Hepatic function panel  4. Vitamin D deficiency -Continue with current treatment pending results of lab work - CBC with Differential/Platelet - VITAMIN D 25 Hydroxy  (Vit-D Deficiency, Fractures)  5. Thoracic aorta atherosclerosis ([place]) -Continue with as aggressive therapeutic lifestyle changes as possible. The patient has had intolerance to statin drugs in the past and we'll have to continue with therapeutic lifestyle changes. - CBC with Differential/Platelet - BMP8+EGFR - Lipid panel  Patient Instructions                       Medicare Annual Wellness Visit  [facility] and the medical providers at [facility] strive to bring you the best medical care.  In doing so we not only want to address your current medical conditions and concerns but also to detect new conditions early and prevent illness, disease and health-related problems.    Medicare offers a yearly Wellness Visit which allows our clinical staff to assess your need for preventative services including immunizations, lifestyle education, counseling to decrease risk of preventable diseases and screening for fall risk and other medical concerns.    This visit is provided free of charge (no copay) for all Medicare recipients.  The clinical pharmacists at [facility] have begun to conduct these Wellness Visits which will also include a thorough review of all your medications.    As you primary medical provider recommend that you make an appointment for your Annual Wellness Visit if you have not done so already this year.  You may set up this appointment before you leave today or you may call back ([phone]) and schedule an appointment.  Please make sure when you call that you mention that you are scheduling your Annual Wellness Visit with the clinical pharmacist so that the appointment may be made for the proper length of time.     Continue current medications. Continue good therapeutic lifestyle changes which include good diet and exercise. Fall precautions discussed with patient. If an FOBT was given today- please return it to our front desk. If you  are over 41 years old - you may need Prevnar 35 or the adult Pneumonia vaccine.  **Flu shots are available--- please call and schedule a FLU-CLINIC appointment**  After your visit with [place] today you will receive a survey in the mail or online from [employer] regarding your care with [place]. Please take a moment to fill this out. Your feedback is very important to [place] as you can help [place] better understand your patient needs as well as improve your experience and satisfaction. WE CARE ABOUT YOU!!!   The patient should continue to follow-up with the pulmonologist as planned He should drink plenty of fluids and stay well hydrated He should continue to use his inhaler regularly He should continue his Mucinex twice daily with a large glass of water on a regular basis He should use his rescue inhaler if needed He should return the FOBT at his convenience    Arrie Senate MD

## 2016-12-15 NOTE — Patient Instructions (Addendum)
Medicare Annual Wellness Visit  [facility] and the medical providers at [facility] strive to bring you the best medical care.  In doing so we not only want to address your current medical conditions and concerns but also to detect new conditions early and prevent illness, disease and health-related problems.    Medicare offers a yearly Wellness Visit which allows our clinical staff to assess your need for preventative services including immunizations, lifestyle education, counseling to decrease risk of preventable diseases and screening for fall risk and other medical concerns.    This visit is provided free of charge (no copay) for all Medicare recipients. The clinical pharmacists at [facility] have begun to conduct these Wellness Visits which will also include a thorough review of all your medications.    As you primary medical provider recommend that you make an appointment for your Annual Wellness Visit if you have not done so already this year.  You may set up this appointment before you leave today or you may call back ([phone]) and schedule an appointment.  Please make sure when you call that you mention that you are scheduling your Annual Wellness Visit with the clinical pharmacist so that the appointment may be made for the proper length of time.     Continue current medications. Continue good therapeutic lifestyle changes which include good diet and exercise. Fall precautions discussed with patient. If an FOBT was given today- please return it to our front desk. If you are over 5 years old - you may need Prevnar 79 or the adult Pneumonia vaccine.  **Flu shots are available--- please call and schedule a FLU-CLINIC appointment**  After your visit with [place] today you will receive a survey in the mail or online from [employer] regarding your care with [place]. Please take a moment to fill this out. Your feedback is very  important to [place] as you can help [place] better understand your patient needs as well as improve your experience and satisfaction. WE CARE ABOUT YOU!!!   The patient should continue to follow-up with the pulmonologist as planned He should drink plenty of fluids and stay well hydrated He should continue to use his inhaler regularly He should continue his Mucinex twice daily with a large glass of water on a regular basis He should use his rescue inhaler if needed He should return the FOBT at his convenience

## 2016-12-16 LAB — CBC WITH DIFFERENTIAL/PLATELET
BASOS ABS: 0.1 10*3/uL (ref 0.0–0.2)
Basos: 1 %
EOS (ABSOLUTE): 1.2 10*3/uL — ABNORMAL HIGH (ref 0.0–0.4)
Eos: 13 %
HEMOGLOBIN: 14.3 g/dL (ref 13.0–17.7)
Hematocrit: 40.7 % (ref 37.5–51.0)
IMMATURE GRANS (ABS): 0.1 10*3/uL (ref 0.0–0.1)
IMMATURE GRANULOCYTES: 1 %
LYMPHS: 28 %
Lymphocytes Absolute: 2.4 10*3/uL (ref 0.7–3.1)
MCH: 30.9 pg (ref 26.6–33.0)
MCHC: 35.1 g/dL (ref 31.5–35.7)
MCV: 88 fL (ref 79–97)
MONOCYTES: 10 %
Monocytes Absolute: 0.9 10*3/uL (ref 0.1–0.9)
Neutrophils Absolute: 4.2 10*3/uL (ref 1.4–7.0)
Neutrophils: 47 %
Platelets: 228 10*3/uL (ref 150–379)
RBC: 4.63 x10E6/uL (ref 4.14–5.80)
RDW: 14.2 % (ref 12.3–15.4)
WBC: 8.7 10*3/uL (ref 3.4–10.8)

## 2016-12-16 LAB — LIPID PANEL
CHOLESTEROL TOTAL: 198 mg/dL (ref 100–199)
Chol/HDL Ratio: 3.9 ratio (ref 0.0–5.0)
HDL: 51 mg/dL (ref >39)
LDL CALC: 127 mg/dL — AB (ref 0–99)
TRIGLYCERIDES: 99 mg/dL (ref 0–149)
VLDL CHOLESTEROL CAL: 20 mg/dL (ref 5–40)

## 2016-12-16 LAB — HEPATIC FUNCTION PANEL
ALBUMIN: 4.7 g/dL (ref 3.5–4.7)
ALK PHOS: 68 IU/L (ref 39–117)
ALT: 13 IU/L (ref 0–44)
AST: 21 IU/L (ref 0–40)
Bilirubin Total: 0.4 mg/dL (ref 0.0–1.2)
Bilirubin, Direct: 0.12 mg/dL (ref 0.00–0.40)
Total Protein: 6.8 g/dL (ref 6.0–8.5)

## 2016-12-16 LAB — BMP8+EGFR
BUN/Creatinine Ratio: 15 (ref 10–24)
BUN: 16 mg/dL (ref 8–27)
CO2: 25 mmol/L (ref 20–29)
CREATININE: 1.1 mg/dL (ref 0.76–1.27)
Calcium: 9.5 mg/dL (ref 8.6–10.2)
Chloride: 100 mmol/L (ref 96–106)
GFR calc Af Amer: 69 mL/min/{1.73_m2} (ref >59)
GFR calc non Af Amer: 60 mL/min/{1.73_m2} (ref >59)
GLUCOSE: 82 mg/dL (ref 65–99)
Potassium: 4.4 mmol/L (ref 3.5–5.2)
SODIUM: 141 mmol/L (ref 134–144)

## 2016-12-16 LAB — VITAMIN D 25 HYDROXY (VIT D DEFICIENCY, FRACTURES): Vit D, 25-Hydroxy: 51.1 ng/mL (ref 30.0–100.0)

## 2016-12-21 DIAGNOSIS — J411 Mucopurulent chronic bronchitis: Secondary | ICD-10-CM | POA: Diagnosis not present

## 2016-12-21 DIAGNOSIS — N401 Enlarged prostate with lower urinary tract symptoms: Secondary | ICD-10-CM | POA: Diagnosis not present

## 2016-12-21 DIAGNOSIS — K210 Gastro-esophageal reflux disease with esophagitis: Secondary | ICD-10-CM | POA: Diagnosis not present

## 2016-12-21 DIAGNOSIS — M542 Cervicalgia: Secondary | ICD-10-CM | POA: Diagnosis not present

## 2017-02-02 ENCOUNTER — Other Ambulatory Visit: Payer: Self-pay | Admitting: Family Medicine

## 2017-03-13 ENCOUNTER — Other Ambulatory Visit: Payer: Self-pay | Admitting: Family Medicine

## 2017-04-27 DIAGNOSIS — L820 Inflamed seborrheic keratosis: Secondary | ICD-10-CM | POA: Diagnosis not present

## 2017-04-27 DIAGNOSIS — L821 Other seborrheic keratosis: Secondary | ICD-10-CM | POA: Diagnosis not present

## 2017-04-27 DIAGNOSIS — D1801 Hemangioma of skin and subcutaneous tissue: Secondary | ICD-10-CM | POA: Diagnosis not present

## 2017-04-27 DIAGNOSIS — L570 Actinic keratosis: Secondary | ICD-10-CM | POA: Diagnosis not present

## 2017-04-27 DIAGNOSIS — Z85828 Personal history of other malignant neoplasm of skin: Secondary | ICD-10-CM | POA: Diagnosis not present

## 2017-04-30 ENCOUNTER — Other Ambulatory Visit: Payer: Self-pay | Admitting: Family Medicine

## 2017-05-04 ENCOUNTER — Other Ambulatory Visit: Payer: Self-pay | Admitting: Family Medicine

## 2017-05-04 NOTE — Telephone Encounter (Signed)
Last Vit D 12/15/16  51.1

## 2017-05-12 ENCOUNTER — Encounter: Payer: Self-pay | Admitting: Family Medicine

## 2017-05-12 ENCOUNTER — Ambulatory Visit (INDEPENDENT_AMBULATORY_CARE_PROVIDER_SITE_OTHER): Payer: PPO | Admitting: Family Medicine

## 2017-05-12 VITALS — BP 139/69 | HR 57 | Temp 96.6°F | Ht 66.0 in | Wt 136.0 lb

## 2017-05-12 DIAGNOSIS — E785 Hyperlipidemia, unspecified: Secondary | ICD-10-CM

## 2017-05-12 DIAGNOSIS — N400 Enlarged prostate without lower urinary tract symptoms: Secondary | ICD-10-CM | POA: Diagnosis not present

## 2017-05-12 DIAGNOSIS — K219 Gastro-esophageal reflux disease without esophagitis: Secondary | ICD-10-CM | POA: Diagnosis not present

## 2017-05-12 DIAGNOSIS — Z0000 Encounter for general adult medical examination without abnormal findings: Secondary | ICD-10-CM | POA: Diagnosis not present

## 2017-05-12 DIAGNOSIS — E559 Vitamin D deficiency, unspecified: Secondary | ICD-10-CM

## 2017-05-12 DIAGNOSIS — I700 Atherosclerosis of aorta: Secondary | ICD-10-CM | POA: Diagnosis not present

## 2017-05-12 DIAGNOSIS — J449 Chronic obstructive pulmonary disease, unspecified: Secondary | ICD-10-CM

## 2017-05-12 DIAGNOSIS — J4489 Other specified chronic obstructive pulmonary disease: Secondary | ICD-10-CM

## 2017-05-12 LAB — MICROSCOPIC EXAMINATION
BACTERIA UA: NONE SEEN
EPITHELIAL CELLS (NON RENAL): NONE SEEN /HPF (ref 0–10)
WBC, UA: NONE SEEN /hpf (ref 0–?)

## 2017-05-12 LAB — URINALYSIS, COMPLETE
Bilirubin, UA: NEGATIVE
Glucose, UA: NEGATIVE
Ketones, UA: NEGATIVE
LEUKOCYTES UA: NEGATIVE
Nitrite, UA: NEGATIVE
PH UA: 5.5 (ref 5.0–7.5)
Protein, UA: NEGATIVE
Specific Gravity, UA: 1.01 (ref 1.005–1.030)
Urobilinogen, Ur: 0.2 mg/dL (ref 0.2–1.0)

## 2017-05-12 MED ORDER — ALBUTEROL SULFATE HFA 108 (90 BASE) MCG/ACT IN AERS: 2.0000 | INHALATION_SPRAY | Freq: Four times a day (QID) | RESPIRATORY_TRACT | 0 refills | Status: DC | PRN

## 2017-05-12 NOTE — Progress Notes (Signed)
Subjective:    Patient ID: Carlos Brooks, male    DOB: 10/23/1926, 84 y.o.   MRN: [id number]  HPI Patient is here today for annual wellness exam and follow up of chronic medical problems which includes GERD, hyperlipidemia and COPD. He is taking medication regularly.  Patient today complains of having to clear his throat constantly.  He does have COPD and is due to see Dr. Luan Pulling and he takes a periodic Z-Pak.  He recently took Keflex.  He is due to have a rectal exam and to return in FOBT and get lab work today.  He is also due to get a urinalysis.  It is important that we note that I saw the patient's wife today and she keeps a fan on them continuously.  This is at nighttime.  I have encouraged her and I will encourage the patient to diminish the use of the fan.  The patient denies any chest pain.  He does have some left-sided discomfort that comes and goes and is quick in nature.  He has had ongoing issues with his COPD and is followed regularly by the pulmonologist.  He is using an inhaler at home and he could not give me the name of it but it sounds like he is using 1 puff once daily and it may be Brio.  He says this is the only inhaler that he has.  He denies any nausea vomiting diarrhea blood in the stool or black tarry bowel movements.  He does have some periodic problems with reflux.  He denies any trouble with passing his water other than frequency.  There is no burning.     Patient Active Problem List   Diagnosis Date Noted  . Primary osteoarthritis involving multiple joints 08/10/2016  . Back pain 08/10/2016  . Neck pain 08/10/2016  . Neuropathy 10/21/2015  . Aortic atherosclerosis ([place]) 10/21/2015  . Dyspnea 08/07/2015  . DYSPHAGIA UNSPECIFIED 06/20/2007  . Allergic rhinitis 04/28/2007  . LUNG NODULE 04/28/2007  . Hyperlipidemia LDL goal <100 03/28/2007  . COPD (chronic obstructive pulmonary disease) with chronic bronchitis ([place]) 03/28/2007  . GERD 03/28/2007  . BPH  (benign prostatic hyperplasia) 03/28/2007   Outpatient Encounter Medications as of 05/12/2017  Medication Sig  . albuterol (PROVENTIL) (2.5 MG/3ML) 0.083% nebulizer solution Take 3 mLs (2.5 mg total) by nebulization every 6 (six) hours as needed. Dx: 496  . aspirin 81 MG EC tablet Take 81 mg by mouth daily.    . calcium carbonate (OS-CAL) 600 MG TABS Take 600 mg by mouth 2 (two) times daily with a meal.    . finasteride (PROSCAR) 5 MG tablet TAKE 1 TABLET DAILY  . fluticasone (FLONASE) 50 MCG/ACT nasal spray Place 2 sprays into the nose daily.  Marland Kitchen guaiFENesin (MUCINEX) 600 MG 12 hr tablet Take 1,200 mg by mouth 2 (two) times daily.    Marland Kitchen LORazepam (ATIVAN) 0.5 MG tablet TAKE (1) TABLET DAILY AS NEEDED.  Marland Kitchen RESTASIS MULTIDOSE 0.05 % ophthalmic emulsion   . terazosin (HYTRIN) 5 MG capsule TAKE (1) CAPSULE DAILY  . Vitamin D, Ergocalciferol, (DRISDOL) 50000 units CAPS capsule TAKE ONE CAPSULE BY MOUTH ONE TIME PER WEEK   No facility-administered encounter medications on file as of 05/12/2017.      Review of Systems  Constitutional: Negative.   HENT: Positive for congestion.   Eyes: Negative.   Respiratory: Positive for shortness of breath.   Cardiovascular: Negative.   Gastrointestinal: Negative.   Endocrine: Negative.   Genitourinary:  Negative.   Musculoskeletal: Negative.   Skin: Negative.   Allergic/Immunologic: Negative.   Neurological: Negative.   Hematological: Negative.   Psychiatric/Behavioral: Negative.        Objective:   Physical Exam  Constitutional: He is oriented to person, place, and time. He appears well-developed and well-nourished. No distress.  Elderly, but alert and looks younger than his stated age.  HENT:  Head: Normocephalic and atraumatic.  Right Ear: External ear normal.  Left Ear: External ear normal.  Nose: Nose normal.  Mouth/Throat: Oropharynx is clear and moist. No oropharyngeal exudate.  Eyes: Conjunctivae and EOM are normal. Pupils are equal, round,  and reactive to light. Right eye exhibits no discharge. Left eye exhibits no discharge. No scleral icterus.  Neck: Normal range of motion. Neck supple. No thyromegaly present.  Left supraclavicular bruit  Cardiovascular: Normal rate, regular rhythm, normal heart sounds and intact distal pulses.  No murmur heard. Heart is regular at 60/min  Pulmonary/Chest: Effort normal and breath sounds normal. No respiratory distress. He has no wheezes. He has no rales. He exhibits no tenderness.  Minimal congestion with fairly good breath sounds anteriorly and posteriorly no axillary adenopathy  Abdominal: Soft. Bowel sounds are normal. He exhibits no mass. There is no tenderness. There is no rebound and no guarding.  Some epigastric tenderness with palpation without liver or spleen enlargement.  There is some gaseous distention.  There is no inguinal adenopathy or masses.  Genitourinary:  Genitourinary Comments: This exam was deferred today at the patient's request  Musculoskeletal: Normal range of motion. He exhibits no edema.  Lymphadenopathy:    He has no cervical adenopathy.  Neurological: He is alert and oriented to person, place, and time. He has normal reflexes. No cranial nerve deficit.  Skin: Skin is warm and dry. No rash noted.  Psychiatric: He has a normal mood and affect. His behavior is normal. Judgment and thought content normal.  Nursing note and vitals reviewed.  BP 139/69 (BP Location: Left Arm)   Pulse (!) 57   Temp (!) 96.6 F (35.9 C) (Oral)   Ht _0  (1.676 m)   Wt 136 lb (61.7 kg)   BMI 21.95 kg/m         Assessment & Plan:  1. Gastroesophageal reflux disease, esophagitis presence not specified -The patient should restart his ranitidine 150 mg, the equate brand from [facility] and take this once or twice daily before breakfast and supper - CBC with Differential/Platelet - Hepatic function panel  2. Hyperlipidemia LDL goal <100 -Continue aggressive therapeutic lifestyle  changes he has been intolerant to statin drugs - CBC with Differential/Platelet - BMP8+EGFR - Lipid panel - Hepatic function panel  3. COPD (chronic obstructive pulmonary disease) with chronic bronchitis ([place]) -The patient says he only has one inhaler at home and he does not think this is helping him a lot and this is a Brio inhaler.  We will switch this to anoro and we will give him 2 sample inhalers and hope that this will be enough for him until he sees the pulmonologist to see if this makes any difference with his breathing. - CBC with Differential/Platelet  4. Thoracic aorta atherosclerosis ([place]) -Continue aggressive therapeutic lifestyle changes - CBC with Differential/Platelet - Lipid panel  5. Vitamin D deficiency -Continue current treatment pending results of lab work - CBC with Differential/Platelet - VITAMIN D 25 Hydroxy (Vit-D Deficiency, Fractures)  6. Benign prostatic hyperplasia, unspecified whether lower urinary tract symptoms present -The patient requested not  to have a rectal exam today because of some GI issues and we will postpone this until the next visit. - CBC with Differential/Platelet - Urinalysis, Complete  7. Annual physical exam -The patient will follow up with the pulmonologist. -He will start ranitidine 150 twice daily before breakfast and supper and we will change his inhalers to see if this makes any difference prior to his visit with the pulmonologist -We will do his rectal exam at the next visit. - CBC with Differential/Platelet - BMP8+EGFR - Lipid panel - VITAMIN D 25 Hydroxy (Vit-D Deficiency, Fractures) - Hepatic function panel - Urinalysis, Complete  Patient Instructions                       Medicare Annual Wellness Visit  [facility] and the medical providers at [facility] strive to bring you the best medical care.  In doing so we not only want to address your current medical conditions and concerns but also to  detect new conditions early and prevent illness, disease and health-related problems.    Medicare offers a yearly Wellness Visit which allows our clinical staff to assess your need for preventative services including immunizations, lifestyle education, counseling to decrease risk of preventable diseases and screening for fall risk and other medical concerns.    This visit is provided free of charge (no copay) for all Medicare recipients. The clinical pharmacists at [facility] have begun to conduct these Wellness Visits which will also include a thorough review of all your medications.    As you primary medical provider recommend that you make an appointment for your Annual Wellness Visit if you have not done so already this year.  You may set up this appointment before you leave today or you may call back ([phone]) and schedule an appointment.  Please make sure when you call that you mention that you are scheduling your Annual Wellness Visit with the clinical pharmacist so that the appointment may be made for the proper length of time.     Continue current medications. Continue good therapeutic lifestyle changes which include good diet and exercise. Fall precautions discussed with patient. If an FOBT was given today- please return it to our front desk. If you are over 8 years old - you may need Prevnar 65 or the adult Pneumonia vaccine.  **Flu shots are available--- please call and schedule a FLU-CLINIC appointment**  After your visit with [place] today you will receive a survey in the mail or online from [employer] regarding your care with [place]. Please take a moment to fill this out. Your feedback is very important to [place] as you can help [place] better understand your patient needs as well as improve your experience and satisfaction. WE CARE ABOUT YOU!!!   Keep appointment with pulmonology as planned Make sure that he reviews your inhalers with you If you are using a Brio inhaler  make sure that you use it regularly and on a daily basis, however, today we will give you an anoro inhaler to try 1 puff once daily and he will hold the Brio inhaler while you are using the anoro We do need to have a rescue inhaler if needed and we will make sure this gets refilled. Drink plenty of fluids and stay well-hydrated Discontinue the use of the fan in the bedroom at nighttime Try ranitidine 150 mg, the equate brand that can be purchased at [facility] and take 1 twice daily before  breakfast and supper to see if this helps your reflux and your hoarseness. We will call with the results of the blood work and the urine as soon as those results become available  Arrie Senate MD

## 2017-05-12 NOTE — Patient Instructions (Addendum)
Medicare Annual Wellness Visit  [facility] and the medical providers at [facility] strive to bring you the best medical care.  In doing so we not only want to address your current medical conditions and concerns but also to detect new conditions early and prevent illness, disease and health-related problems.    Medicare offers a yearly Wellness Visit which allows our clinical staff to assess your need for preventative services including immunizations, lifestyle education, counseling to decrease risk of preventable diseases and screening for fall risk and other medical concerns.    This visit is provided free of charge (no copay) for all Medicare recipients. The clinical pharmacists at [facility] have begun to conduct these Wellness Visits which will also include a thorough review of all your medications.    As you primary medical provider recommend that you make an appointment for your Annual Wellness Visit if you have not done so already this year.  You may set up this appointment before you leave today or you may call back ([phone]) and schedule an appointment.  Please make sure when you call that you mention that you are scheduling your Annual Wellness Visit with the clinical pharmacist so that the appointment may be made for the proper length of time.     Continue current medications. Continue good therapeutic lifestyle changes which include good diet and exercise. Fall precautions discussed with patient. If an FOBT was given today- please return it to our front desk. If you are over 20 years old - you may need Prevnar 75 or the adult Pneumonia vaccine.  **Flu shots are available--- please call and schedule a FLU-CLINIC appointment**  After your visit with [place] today you will receive a survey in the mail or online from [employer] regarding your care with [place]. Please take a moment to fill this out. Your feedback is very  important to [place] as you can help [place] better understand your patient needs as well as improve your experience and satisfaction. WE CARE ABOUT YOU!!!   Keep appointment with pulmonology as planned Make sure that he reviews your inhalers with you If you are using a Brio inhaler make sure that you use it regularly and on a daily basis, however, today we will give you an anoro inhaler to try 1 puff once daily and he will hold the Brio inhaler while you are using the anoro We do need to have a rescue inhaler if needed and we will make sure this gets refilled. Drink plenty of fluids and stay well-hydrated Discontinue the use of the fan in the bedroom at nighttime Try ranitidine 150 mg, the equate brand that can be purchased at [facility] and take 1 twice daily before breakfast and supper to see if this helps your reflux and your hoarseness. We will call with the results of the blood work and the urine as soon as those results become available

## 2017-05-13 LAB — CBC WITH DIFFERENTIAL/PLATELET
BASOS ABS: 0.1 10*3/uL (ref 0.0–0.2)
Basos: 1 %
EOS (ABSOLUTE): 0.8 10*3/uL — AB (ref 0.0–0.4)
Eos: 11 %
Hematocrit: 44.1 % (ref 37.5–51.0)
Hemoglobin: 14.6 g/dL (ref 13.0–17.7)
Immature Grans (Abs): 0 10*3/uL (ref 0.0–0.1)
Immature Granulocytes: 0 %
LYMPHS ABS: 2 10*3/uL (ref 0.7–3.1)
Lymphs: 29 %
MCH: 30 pg (ref 26.6–33.0)
MCHC: 33.1 g/dL (ref 31.5–35.7)
MCV: 91 fL (ref 79–97)
MONOS ABS: 0.7 10*3/uL (ref 0.1–0.9)
Monocytes: 10 %
NEUTROS ABS: 3.5 10*3/uL (ref 1.4–7.0)
Neutrophils: 49 %
PLATELETS: 203 10*3/uL (ref 150–379)
RBC: 4.87 x10E6/uL (ref 4.14–5.80)
RDW: 15.1 % (ref 12.3–15.4)
WBC: 7.1 10*3/uL (ref 3.4–10.8)

## 2017-05-13 LAB — BMP8+EGFR
BUN/Creatinine Ratio: 12 (ref 10–24)
BUN: 13 mg/dL (ref 8–27)
CHLORIDE: 101 mmol/L (ref 96–106)
CO2: 26 mmol/L (ref 20–29)
CREATININE: 1.13 mg/dL (ref 0.76–1.27)
Calcium: 9.8 mg/dL (ref 8.6–10.2)
GFR calc Af Amer: 67 mL/min/{1.73_m2} (ref >59)
GFR calc non Af Amer: 58 mL/min/{1.73_m2} — ABNORMAL LOW (ref >59)
GLUCOSE: 93 mg/dL (ref 65–99)
Potassium: 4.4 mmol/L (ref 3.5–5.2)
SODIUM: 143 mmol/L (ref 134–144)

## 2017-05-13 LAB — LIPID PANEL
CHOLESTEROL TOTAL: 198 mg/dL (ref 100–199)
Chol/HDL Ratio: 3.5 ratio (ref 0.0–5.0)
HDL: 57 mg/dL (ref >39)
LDL CALC: 118 mg/dL — AB (ref 0–99)
Triglycerides: 115 mg/dL (ref 0–149)
VLDL Cholesterol Cal: 23 mg/dL (ref 5–40)

## 2017-05-13 LAB — HEPATIC FUNCTION PANEL
ALBUMIN: 4.6 g/dL (ref 3.5–4.7)
ALK PHOS: 47 IU/L (ref 39–117)
ALT: 12 IU/L (ref 0–44)
AST: 16 IU/L (ref 0–40)
BILIRUBIN, DIRECT: 0.14 mg/dL (ref 0.00–0.40)
Bilirubin Total: 0.4 mg/dL (ref 0.0–1.2)
Total Protein: 7 g/dL (ref 6.0–8.5)

## 2017-05-13 LAB — VITAMIN D 25 HYDROXY (VIT D DEFICIENCY, FRACTURES): Vit D, 25-Hydroxy: 42.8 ng/mL (ref 30.0–100.0)

## 2017-05-18 ENCOUNTER — Other Ambulatory Visit: Payer: PPO

## 2017-05-18 DIAGNOSIS — Z1211 Encounter for screening for malignant neoplasm of colon: Secondary | ICD-10-CM

## 2017-05-19 LAB — FECAL OCCULT BLOOD, IMMUNOCHEMICAL: Fecal Occult Bld: NEGATIVE

## 2017-06-17 DIAGNOSIS — J301 Allergic rhinitis due to pollen: Secondary | ICD-10-CM | POA: Diagnosis not present

## 2017-06-17 DIAGNOSIS — J411 Mucopurulent chronic bronchitis: Secondary | ICD-10-CM | POA: Diagnosis not present

## 2017-06-17 DIAGNOSIS — K210 Gastro-esophageal reflux disease with esophagitis: Secondary | ICD-10-CM | POA: Diagnosis not present

## 2017-06-17 DIAGNOSIS — J309 Allergic rhinitis, unspecified: Secondary | ICD-10-CM | POA: Diagnosis not present

## 2017-07-01 ENCOUNTER — Other Ambulatory Visit: Payer: Self-pay | Admitting: Family Medicine

## 2017-07-20 NOTE — Progress Notes (Signed)
Cardiology Office Note   Date:  07/21/2017   ID:  Carlos Brooks, DOB [birth year], MRN [id number]  PCP:  Chipper Herb, MD  Cardiologist:   Minus Breeding, MD   Chief Complaint  Patient presents with  . Shortness of Breath      History of Present Illness: Carlos Brooks is a 84 y.o. male who presents for evaluation of dyspnea. He has previously seen Dr. Mare Ferrari .  I saw once two years ago.  Since then he has done well.  He stays active.  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  He does activities around the yard.  He does he is his bronchodilators daily but he does not report that this is for wheezing.  He does not really say that his shortness of breath which is mild to chronic is changed.  He is denying any chest pressure though he has had a "weird water dripping sensation" sporadically down the left side of his chest.  He denies any substernal chest pressure, neck or arm discomfort.  He has not had any palpitations, presyncope or syncope.   Past Medical History:  Diagnosis Date  . BPH (benign prostatic hyperplasia)   . Cancer ([place])    skin  . Cataract   . COPD (chronic obstructive pulmonary disease) ([place])   . Generalized headaches   . GERD (gastroesophageal reflux disease)   . Hiatal hernia   . Hyperlipidemia     Past Surgical History:  Procedure Laterality Date  . EYE SURGERY    . HERNIA REPAIR     x 3, inguinal      Current Outpatient Medications  Medication Sig Dispense Refill  . ANORO ELLIPTA 62.5-25 MCG/INH AEPB Inhale 1 puff into the lungs daily.     Marland Kitchen aspirin 81 MG EC tablet Take 81 mg by mouth daily.      . calcium carbonate (OS-CAL) 600 MG TABS Take 600 mg by mouth 2 (two) times daily with a meal.      . finasteride (PROSCAR) 5 MG tablet TAKE 1 TABLET DAILY 90 tablet 1  . fluticasone (FLONASE) 50 MCG/ACT nasal spray Place 2 sprays into the nose daily.    Marland Kitchen guaiFENesin (MUCINEX) 600 MG 12 hr tablet Take 1,200 mg by mouth 2 (two) times  daily.      Marland Kitchen LORazepam (ATIVAN) 0.5 MG tablet TAKE (1) TABLET DAILY AS NEEDED. 30 tablet 2  . PROAIR HFA 108 (90 Base) MCG/ACT inhaler 2 PUFFS EVERY 6 HOURS AS NEEDED FOR WHEEZING OR SHORTNESS OF BREATH 8.5 g 2  . terazosin (HYTRIN) 5 MG capsule TAKE (1) CAPSULE DAILY 90 capsule 0  . Vitamin D, Ergocalciferol, (DRISDOL) 50000 units CAPS capsule TAKE ONE CAPSULE BY MOUTH ONE TIME PER WEEK 12 capsule 0  . albuterol (PROVENTIL) (2.5 MG/3ML) 0.083% nebulizer solution Take 3 mLs (2.5 mg total) by nebulization every 6 (six) hours as needed. Dx: 696 (Patient not taking: Reported on 07/21/2017) 75 mL 3   No current facility-administered medications for this visit.     Allergies:   Clarithromycin; Crestor [rosuvastatin calcium]; Lipitor [atorvastatin calcium]; Niaspan [niacin er]; Pneumovax [pneumococcal polysaccharide vaccine]; Pravachol; Ranitidine hcl; and Simvastatin     ROS:  Please see the history of present illness.   Otherwise, review of systems are positive for none.   All other systems are reviewed and negative.    PHYSICAL EXAM: VS:  BP 130/60   Ht 5\' 7"  (1.702 m)   Wt  138 lb (62.6 kg)   BMI 21.61 kg/m  , BMI Body mass index is 21.61 kg/m.  GENERAL:  Well appearing, he looks much younger than his stated age NECK:  No jugular venous distention, waveform within normal limits, carotid upstroke brisk and symmetric, no bruits, no thyromegaly LUNGS:  Clear to auscultation bilaterally CHEST:  Unremarkable HEART:  PMI not displaced or sustained,S1 and S2 within normal limits, no S3, no S4, no clicks, no rubs, no murmurs ABD:  Flat, positive bowel sounds normal in frequency in pitch, no bruits, no rebound, no guarding, no midline pulsatile mass, no hepatomegaly, no splenomegaly EXT:  2 plus pulses throughout, no edema, no cyanosis no clubbing   EKG:  EKG is  ordered today. The ekg ordered today demonstrates sinus bradycardia, rate 86, axis within normal limits, new left bundle branch block  compared to previous.   Recent Labs: 05/12/2017: ALT 12; BUN 13; Creatinine, Ser 1.13; Hemoglobin 14.6; Platelets 203; Potassium 4.4; Sodium 143    Lipid Panel    Component Value Date/Time   CHOL 198 05/12/2017 1016   CHOL 197 08/04/2012 1151   TRIG 115 05/12/2017 1016   TRIG 191 (H) 10/21/2015 1048   TRIG 162 (H) 08/04/2012 1151   HDL 57 05/12/2017 1016   HDL 46 10/21/2015 1048   HDL 45 08/04/2012 1151   CHOLHDL 3.5 05/12/2017 1016   LDLCALC 118 (H) 05/12/2017 1016   LDLCALC 154 (H) 12/05/2013 0920   LDLCALC 120 (H) 08/04/2012 1151      Wt Readings from Last 3 Encounters:  07/21/17 138 lb (62.6 kg)  05/12/17 136 lb (61.7 kg)  12/15/16 138 lb (62.6 kg)      Other studies Reviewed: Additional studies/ records that were reviewed today include: Labs Review of the above records demonstrates:      ASSESSMENT AND PLAN:  Dyspnea:  I suspect this is related to his COPD.  This is chronic and unchanged.  He is otherwise doing well particular for his age.  No change in therapy.  LBBB:   This is new compared to previous.  However, he has had no symptoms suggestive of bradycardia arrhythmia.  Had no symptoms suggestive of chest pain or new angina.  At this point no specific cardiovascular testing is indicated.  Dyslipidemia:    No change in therapy.   Current medicines are reviewed at length with the patient today.  The patient does not have concerns regarding medicines.  The following changes have been made:  None  Labs/ tests ordered today include: None  Orders Placed This Encounter  Procedures  . EKG 12-Lead     Disposition:   FU with me in one year.       Signed, Minus Breeding, MD  07/21/2017 3:07 PM    [facility]

## 2017-07-21 ENCOUNTER — Encounter: Payer: Self-pay | Admitting: Cardiology

## 2017-07-21 ENCOUNTER — Ambulatory Visit: Payer: PPO | Admitting: Cardiology

## 2017-07-21 VITALS — BP 130/60 | Ht 67.0 in | Wt 138.0 lb

## 2017-07-21 DIAGNOSIS — I447 Left bundle-branch block, unspecified: Secondary | ICD-10-CM | POA: Diagnosis not present

## 2017-07-21 DIAGNOSIS — R0600 Dyspnea, unspecified: Secondary | ICD-10-CM | POA: Diagnosis not present

## 2017-07-21 DIAGNOSIS — E7849 Other hyperlipidemia: Secondary | ICD-10-CM

## 2017-07-21 NOTE — Patient Instructions (Signed)

## 2017-08-03 ENCOUNTER — Other Ambulatory Visit: Payer: Self-pay | Admitting: Family Medicine

## 2017-08-03 NOTE — Telephone Encounter (Signed)
Last seen 3.6/19  DWM 

## 2017-08-16 ENCOUNTER — Other Ambulatory Visit: Payer: Self-pay | Admitting: Family Medicine

## 2017-08-24 ENCOUNTER — Other Ambulatory Visit: Payer: Self-pay | Admitting: Family Medicine

## 2017-09-28 ENCOUNTER — Other Ambulatory Visit: Payer: Self-pay | Admitting: Family Medicine

## 2017-09-29 ENCOUNTER — Ambulatory Visit (INDEPENDENT_AMBULATORY_CARE_PROVIDER_SITE_OTHER): Payer: PPO | Admitting: Family Medicine

## 2017-09-29 ENCOUNTER — Encounter: Payer: Self-pay | Admitting: Family Medicine

## 2017-09-29 VITALS — BP 147/64 | HR 51 | Temp 96.9°F | Ht 67.0 in | Wt 140.0 lb

## 2017-09-29 DIAGNOSIS — E559 Vitamin D deficiency, unspecified: Secondary | ICD-10-CM | POA: Diagnosis not present

## 2017-09-29 DIAGNOSIS — K219 Gastro-esophageal reflux disease without esophagitis: Secondary | ICD-10-CM | POA: Diagnosis not present

## 2017-09-29 DIAGNOSIS — J449 Chronic obstructive pulmonary disease, unspecified: Secondary | ICD-10-CM | POA: Diagnosis not present

## 2017-09-29 DIAGNOSIS — N400 Enlarged prostate without lower urinary tract symptoms: Secondary | ICD-10-CM | POA: Diagnosis not present

## 2017-09-29 DIAGNOSIS — M549 Dorsalgia, unspecified: Secondary | ICD-10-CM | POA: Diagnosis not present

## 2017-09-29 DIAGNOSIS — M542 Cervicalgia: Secondary | ICD-10-CM

## 2017-09-29 DIAGNOSIS — E785 Hyperlipidemia, unspecified: Secondary | ICD-10-CM

## 2017-09-29 DIAGNOSIS — I700 Atherosclerosis of aorta: Secondary | ICD-10-CM

## 2017-09-29 NOTE — Progress Notes (Signed)
Subjective:    Patient ID: Carlos Brooks, male    DOB: 03-12-1926, 84 y.o.   MRN: [id number]  HPI Pt here for follow up and management of chronic medical problems which includes hyperlipidemia and gerd. He is taking medication regularly.  The patient is doing well overall and only complains of some hoarseness.  He will get lab work today.  His vital signs are stable and his weight is up a couple pounds since last visit.  He continues to be followed regularly by the pulmonologist, Dr. Luan Pulling.  The patient is pleasant and alert.  He sees the pulmonologist about every 6 months.  He is on a anoro and a rescue inhaler.  He rinses his mouth out after using his inhalers.  He denies any more shortness of breath than usual and the important thing is that he is using his inhalers regularly and in the past has not been doing that.  He does have occasional chest pain but is related to possibly eating and swallowing his food and he feels like there is a gurgling sensation.  He denies any blood in the stool or black tarry bowel movements.  He does have occasional loose bowel movements but this is not often.  He is passing his water without any major problems.  He has not had any more cough and congestion than usual.    Patient Active Problem List   Diagnosis Date Noted  . LBBB (left bundle branch block) 07/21/2017  . Primary osteoarthritis involving multiple joints 08/10/2016  . Back pain 08/10/2016  . Neck pain 08/10/2016  . Neuropathy 10/21/2015  . Aortic atherosclerosis ([place]) 10/21/2015  . Dyspnea 08/07/2015  . DYSPHAGIA UNSPECIFIED 06/20/2007  . Allergic rhinitis 04/28/2007  . LUNG NODULE 04/28/2007  . Hyperlipidemia LDL goal <100 03/28/2007  . COPD (chronic obstructive pulmonary disease) with chronic bronchitis ([place]) 03/28/2007  . GERD 03/28/2007  . BPH (benign prostatic hyperplasia) 03/28/2007   Outpatient Encounter Medications as of 09/29/2017  Medication Sig  . albuterol (PROVENTIL) (2.5  MG/3ML) 0.083% nebulizer solution Take 3 mLs (2.5 mg total) by nebulization every 6 (six) hours as needed. Dx: 496  . ANORO ELLIPTA 62.5-25 MCG/INH AEPB Inhale 1 puff into the lungs daily.   Marland Kitchen aspirin 81 MG EC tablet Take 81 mg by mouth daily.    . calcium carbonate (OS-CAL) 600 MG TABS Take 600 mg by mouth 2 (two) times daily with a meal.    . finasteride (PROSCAR) 5 MG tablet TAKE 1 TABLET DAILY  . fluticasone (FLONASE) 50 MCG/ACT nasal spray Place 2 sprays into the nose daily.  Marland Kitchen guaiFENesin (MUCINEX) 600 MG 12 hr tablet Take 1,200 mg by mouth 2 (two) times daily.    Marland Kitchen LORazepam (ATIVAN) 0.5 MG tablet TAKE (1) TABLET DAILY AS NEEDED.  Marland Kitchen PROAIR HFA 108 (90 Base) MCG/ACT inhaler 2 PUFFS EVERY 6 HOURS AS NEEDED FOR WHEEZING OR SHORTNESS OF BREATH  . terazosin (HYTRIN) 5 MG capsule TAKE (1) CAPSULE DAILY  . Vitamin D, Ergocalciferol, (DRISDOL) 50000 units CAPS capsule TAKE ONE CAPSULE BY MOUTH ONE TIME PER WEEK  . [DISCONTINUED] terazosin (HYTRIN) 5 MG capsule TAKE (1) CAPSULE DAILY  . [DISCONTINUED] Vitamin D, Ergocalciferol, (DRISDOL) 50000 units CAPS capsule TAKE 1 CAPSULE ONCE A WEEK   No facility-administered encounter medications on file as of 09/29/2017.       Review of Systems  Constitutional: Negative.   HENT: Positive for voice change.   Eyes: Negative.   Respiratory: Negative.  Cardiovascular: Negative.   Gastrointestinal: Negative.   Endocrine: Negative.   Genitourinary: Negative.   Musculoskeletal: Negative.   Skin: Negative.   Allergic/Immunologic: Negative.   Neurological: Negative.   Hematological: Negative.   Psychiatric/Behavioral: Negative.        Objective:   Physical Exam  Constitutional: He is oriented to person, place, and time. He appears well-developed and well-nourished. No distress.  Patient is pleasant and alert and like his wife looks much younger than his stated age of 84 years.  HENT:  Head: Normocephalic and atraumatic.  Right Ear: External ear  normal.  Left Ear: External ear normal.  Nose: Nose normal.  Mouth/Throat: Oropharynx is clear and moist. No oropharyngeal exudate.  Eyes: Pupils are equal, round, and reactive to light. Conjunctivae and EOM are normal. Right eye exhibits no discharge. Left eye exhibits no discharge. No scleral icterus.  Does not get regular eye exams.  Neck: Normal range of motion. Neck supple. No thyromegaly present.  No bruits thyromegaly or anterior cervical adenopathy  Cardiovascular: Normal rate, regular rhythm, normal heart sounds and intact distal pulses.  No murmur heard. Heart is regular today at 72/min  Pulmonary/Chest: Effort normal and breath sounds normal. No respiratory distress. He has no wheezes. He has no rales.  Clear anteriorly and posteriorly with good breath sounds.  Minimal tightness with coughing.  No axillary adenopathy.  Abdominal: Soft. Bowel sounds are normal. He exhibits no mass. There is no tenderness.  Slight epigastric tenderness without liver or spleen enlargement.  No bruits no inguinal adenopathy and no masses palpable.  Genitourinary: Rectum normal and penis normal.  Genitourinary Comments: Prostate is enlarged and smooth without lumps or masses.  There were no rectal masses.  The external genitalia were within normal limits and no inguinal hernias were palpable.  Musculoskeletal: Normal range of motion. He exhibits no edema or tenderness.  Some pain with movement of the neck and low back most likely secondary to wear and tear arthritis.  Lymphadenopathy:    He has no cervical adenopathy.  Neurological: He is alert and oriented to person, place, and time. He has normal reflexes. No cranial nerve deficit.  Skin: Skin is warm and dry. No rash noted.  Psychiatric: He has a normal mood and affect. His behavior is normal. Judgment and thought content normal.  Normal mood affect and behavior for this patient.  Nursing note and vitals reviewed.   BP (!) 147/64 (BP Location:  Left Arm)   Pulse (!) 51   Temp (!) 96.9 F (36.1 C) (Oral)   Ht 5' 7"  (1.702 m)   Wt 140 lb (63.5 kg)   BMI 21.93 kg/m        Assessment & Plan:  1. Hyperlipidemia LDL goal <100 -Continue with aggressive therapeutic lifestyle changes - BMP8+EGFR - CBC with Differential/Platelet - Lipid panel  2. Gastroesophageal reflux disease, esophagitis presence not specified -If problems with swallowing gets worse, the patient was reminded to get in touch with [place] so we could arrange for him to see the gastroenterologist. - CBC with Differential/Platelet - Hepatic function panel  3. COPD (chronic obstructive pulmonary disease) with chronic bronchitis ([place]) -Follow-up with pulmonology as planned - CBC with Differential/Platelet  4. Thoracic aorta atherosclerosis ([place]) -Continue aggressive therapeutic lifestyle changes - CBC with Differential/Platelet - Lipid panel  5. Vitamin D deficiency -Continue with vitamin D replacement pending results of lab work - CBC with Differential/Platelet - VITAMIN D 25 Hydroxy (Vit-D Deficiency, Fractures)  6. Benign prostatic hyperplasia, unspecified  whether lower urinary tract symptoms present -The prostate remains enlarged but soft without lumps or masses. - CBC with Differential/Platelet  7. Benign prostatic hyperplasia without lower urinary tract symptoms -take Tylenol as needed for pain and stay as active as possible with walking.  8. Back pain, unspecified back location, unspecified back pain laterality, unspecified chronicity -As above   9. Neck pain -Tylenol as needed for pain  Patient Instructions                       Medicare Annual Wellness Visit  [facility] and the medical providers at [facility] strive to bring you the best medical care.  In doing so we not only want to address your current medical conditions and concerns but also to detect new conditions early and prevent illness, disease and  health-related problems.    Medicare offers a yearly Wellness Visit which allows our clinical staff to assess your need for preventative services including immunizations, lifestyle education, counseling to decrease risk of preventable diseases and screening for fall risk and other medical concerns.    This visit is provided free of charge (no copay) for all Medicare recipients. The clinical pharmacists at [facility] have begun to conduct these Wellness Visits which will also include a thorough review of all your medications.    As you primary medical provider recommend that you make an appointment for your Annual Wellness Visit if you have not done so already this year.  You may set up this appointment before you leave today or you may call back ([phone]) and schedule an appointment.  Please make sure when you call that you mention that you are scheduling your Annual Wellness Visit with the clinical pharmacist so that the appointment may be made for the proper length of time.     Continue current medications. Continue good therapeutic lifestyle changes which include good diet and exercise. Fall precautions discussed with patient. If an FOBT was given today- please return it to our front desk. If you are over 29 years old - you may need Prevnar 73 or the adult Pneumonia vaccine.  **Flu shots are available--- please call and schedule a FLU-CLINIC appointment**  After your visit with [place] today you will receive a survey in the mail or online from [employer] regarding your care with [place]. Please take a moment to fill this out. Your feedback is very important to [place] as you can help [place] better understand your patient needs as well as improve your experience and satisfaction. WE CARE ABOUT YOU!!!   Continue to stay active physically but limit time outdoors when it is extremely hot and drink plenty of water to stay well-hydrated Rinse mouth after using inhaler Take Tylenol as needed  for aches and pains which include neck pain and back pain. If trouble with swallowing gets worse the patient should get back in touch with [place] so we can arrange for him to have an endoscopy and possible dilatation with a gastroenterologist  Arrie Senate MD

## 2017-09-29 NOTE — Patient Instructions (Addendum)
Medicare Annual Wellness Visit  [facility] and the medical providers at [facility] strive to bring you the best medical care.  In doing so we not only want to address your current medical conditions and concerns but also to detect new conditions early and prevent illness, disease and health-related problems.    Medicare offers a yearly Wellness Visit which allows our clinical staff to assess your need for preventative services including immunizations, lifestyle education, counseling to decrease risk of preventable diseases and screening for fall risk and other medical concerns.    This visit is provided free of charge (no copay) for all Medicare recipients. The clinical pharmacists at [facility] have begun to conduct these Wellness Visits which will also include a thorough review of all your medications.    As you primary medical provider recommend that you make an appointment for your Annual Wellness Visit if you have not done so already this year.  You may set up this appointment before you leave today or you may call back ([phone]) and schedule an appointment.  Please make sure when you call that you mention that you are scheduling your Annual Wellness Visit with the clinical pharmacist so that the appointment may be made for the proper length of time.     Continue current medications. Continue good therapeutic lifestyle changes which include good diet and exercise. Fall precautions discussed with patient. If an FOBT was given today- please return it to our front desk. If you are over 58 years old - you may need Prevnar 26 or the adult Pneumonia vaccine.  **Flu shots are available--- please call and schedule a FLU-CLINIC appointment**  After your visit with [place] today you will receive a survey in the mail or online from [employer] regarding your care with [place]. Please take a moment to fill this out. Your feedback is very  important to [place] as you can help [place] better understand your patient needs as well as improve your experience and satisfaction. WE CARE ABOUT YOU!!!   Continue to stay active physically but limit time outdoors when it is extremely hot and drink plenty of water to stay well-hydrated Rinse mouth after using inhaler Take Tylenol as needed for aches and pains which include neck pain and back pain. If trouble with swallowing gets worse the patient should get back in touch with [place] so we can arrange for him to have an endoscopy and possible dilatation with a gastroenterologist

## 2017-09-29 NOTE — Telephone Encounter (Signed)
Last seen 3.6/19  DWM

## 2017-09-30 LAB — LIPID PANEL
Chol/HDL Ratio: 3.5 ratio (ref 0.0–5.0)
Cholesterol, Total: 193 mg/dL (ref 100–199)
HDL: 55 mg/dL (ref >39)
LDL Calculated: 118 mg/dL — ABNORMAL HIGH (ref 0–99)
Triglycerides: 98 mg/dL (ref 0–149)
VLDL CHOLESTEROL CAL: 20 mg/dL (ref 5–40)

## 2017-09-30 LAB — BMP8+EGFR
BUN / CREAT RATIO: 13 (ref 10–24)
BUN: 15 mg/dL (ref 8–27)
CO2: 24 mmol/L (ref 20–29)
Calcium: 9.7 mg/dL (ref 8.6–10.2)
Chloride: 99 mmol/L (ref 96–106)
Creatinine, Ser: 1.12 mg/dL (ref 0.76–1.27)
GFR, EST AFRICAN AMERICAN: 67 mL/min/{1.73_m2} (ref >59)
GFR, EST NON AFRICAN AMERICAN: 58 mL/min/{1.73_m2} — AB (ref >59)
Glucose: 88 mg/dL (ref 65–99)
POTASSIUM: 4.3 mmol/L (ref 3.5–5.2)
Sodium: 140 mmol/L (ref 134–144)

## 2017-09-30 LAB — CBC WITH DIFFERENTIAL/PLATELET
BASOS: 1 %
Basophils Absolute: 0.1 10*3/uL (ref 0.0–0.2)
EOS (ABSOLUTE): 1.2 10*3/uL — AB (ref 0.0–0.4)
EOS: 14 %
HEMATOCRIT: 42 % (ref 37.5–51.0)
Hemoglobin: 14.1 g/dL (ref 13.0–17.7)
Immature Grans (Abs): 0.1 10*3/uL (ref 0.0–0.1)
Immature Granulocytes: 1 %
Lymphocytes Absolute: 2.2 10*3/uL (ref 0.7–3.1)
Lymphs: 27 %
MCH: 30.2 pg (ref 26.6–33.0)
MCHC: 33.6 g/dL (ref 31.5–35.7)
MCV: 90 fL (ref 79–97)
Monocytes Absolute: 0.9 10*3/uL (ref 0.1–0.9)
Monocytes: 10 %
NEUTROS ABS: 3.9 10*3/uL (ref 1.4–7.0)
Neutrophils: 47 %
PLATELETS: 165 10*3/uL (ref 150–450)
RBC: 4.67 x10E6/uL (ref 4.14–5.80)
RDW: 13.2 % (ref 12.3–15.4)
WBC: 8.3 10*3/uL (ref 3.4–10.8)

## 2017-09-30 LAB — HEPATIC FUNCTION PANEL
ALBUMIN: 4.8 g/dL — AB (ref 3.5–4.7)
ALT: 13 IU/L (ref 0–44)
AST: 21 IU/L (ref 0–40)
Alkaline Phosphatase: 45 IU/L (ref 39–117)
BILIRUBIN TOTAL: 0.5 mg/dL (ref 0.0–1.2)
Bilirubin, Direct: 0.13 mg/dL (ref 0.00–0.40)
Total Protein: 6.9 g/dL (ref 6.0–8.5)

## 2017-09-30 LAB — VITAMIN D 25 HYDROXY (VIT D DEFICIENCY, FRACTURES): VIT D 25 HYDROXY: 42 ng/mL (ref 30.0–100.0)

## 2017-10-13 ENCOUNTER — Ambulatory Visit (INDEPENDENT_AMBULATORY_CARE_PROVIDER_SITE_OTHER): Payer: PPO

## 2017-10-13 VITALS — BP 137/72 | HR 79 | Temp 97.9°F | Ht 67.0 in | Wt 139.0 lb

## 2017-10-13 DIAGNOSIS — Z0000 Encounter for general adult medical examination without abnormal findings: Secondary | ICD-10-CM | POA: Diagnosis not present

## 2017-10-13 NOTE — Patient Instructions (Signed)
  Carlos Brooks , Thank you for taking time to come for your Medicare Wellness Visit. I appreciate your ongoing commitment to your health goals. Please review the following plan we discussed and let me know if I can assist you in the future.   These are the goals we discussed: Goals    . DIET - EAT MORE FRUITS AND VEGETABLES    . Follow up with Primary Care Provider       This is a list of the screening recommended for you and due dates:  Health Maintenance  Topic Date Due  . Flu Shot  10/07/2017  . Pneumonia vaccines (2 of 2 - PCV13) 09/30/2018*  . Tetanus Vaccine  11/06/2024  *Topic was postponed. The date shown is not the original due date.

## 2017-10-13 NOTE — Progress Notes (Signed)
Subjective:   Carlos Brooks is a 84 y.o. male who presents for Medicare Annual/Subsequent preventive examination.  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  He and his wife live alone and have five children (one who passed away with a heart attack), eight grandchildren, and one great-grandchild.  In his spare time he enjoys reading western novels.  He and his wife are very active members of their church, [employer].    Review of Systems:   Cardiac Risk Factors include: advanced age (>71men, >55 women);male gender;sedentary lifestyle;smoking/ tobacco exposure     Objective:    Vitals: BP 137/72   Pulse 79   Temp 97.9 F (36.6 C) (Oral)   Ht 5\' 7"  (1.702 m)   Wt 139 lb (63 kg)   BMI 21.77 kg/m   Body mass index is 21.77 kg/m.  Advanced Directives 10/13/2017 10/24/2015 10/19/2014  Does Patient Have a Medical Advance Directive? Yes No No  Type of Paramedic of [place];Living will - -  Does patient want to make changes to medical advance directive? No - Patient declined - -  Copy of [place] in Chart? No - copy requested - -  Would patient like information on creating a medical advance directive? - No - patient declined information Yes - Educational materials given    Tobacco Social History   Tobacco Use  Smoking Status Former Smoker  . Last attempt to quit: 04/13/1993  . Years since quitting: 24.5  Smokeless Tobacco Never Used      Clinical Intake:  Past Medical History:  Diagnosis Date  . BPH (benign prostatic hyperplasia)   . Cancer ([place])    skin  . Cataract   . COPD (chronic obstructive pulmonary disease) ([place])   . Generalized headaches   . GERD (gastroesophageal reflux disease)   . Hiatal hernia   . Hyperlipidemia    Past Surgical History:  Procedure Laterality Date  . EYE SURGERY    . HERNIA REPAIR     x 3, inguinal    Family History    Problem Relation Age of Onset  . Cancer Mother        [name]  . Cancer Father        lung  . Heart attack Brother 66       Not clear details  . Heart attack Son 75       Died with an MI  . Cancer Brother        liver  . Heart attack Brother 61  . Aneurysm Brother    Social History   Socioeconomic History  . Marital status: Married    Spouse name: Not on file  . Number of children: 5  . Years of education: Not on file  . Highest education level: Not on file  Occupational History  . Not on file  Social Needs  . Financial resource strain: Not hard at all  . Food insecurity:    Worry: Never true    Inability: Never true  . Transportation needs:    Medical: No    Non-medical: No  Tobacco Use  . Smoking status: Former Smoker    Last attempt to quit: 04/13/1993    Years since quitting: 24.5  . Smokeless tobacco: Never Used  Substance and Sexual Activity  . Alcohol use: No  . Drug use: No  . Sexual activity: Never  Lifestyle  . Physical activity:    Days per week: 0 days    Minutes per session: 0 min  . Stress: Not at all  Relationships  . Social connections:    Talks on phone: More than three times a week    Gets together: More than three times a week    Attends religious service: More than 4 times per year    Active member of club or organization: Yes    Attends meetings of clubs or organizations: More than 4 times per year    Relationship status: Married  Other Topics Concern  . Not on file  Social History Narrative  . Not on file    Outpatient Encounter Medications as of 10/13/2017  Medication Sig  . albuterol (PROVENTIL) (2.5 MG/3ML) 0.083% nebulizer solution Take 3 mLs (2.5 mg total) by nebulization every 6 (six) hours as needed. Dx: 496  . ANORO ELLIPTA 62.5-25 MCG/INH AEPB Inhale 1 puff into the lungs daily.   Marland Kitchen aspirin 81 MG EC tablet Take 81 mg by mouth daily.    . calcium carbonate (OS-CAL) 600 MG TABS Take 600 mg by mouth 2 (two) times daily with a  meal.    . finasteride (PROSCAR) 5 MG tablet TAKE 1 TABLET DAILY  . fluticasone (FLONASE) 50 MCG/ACT nasal spray Place 2 sprays into the nose daily.  Marland Kitchen guaiFENesin (MUCINEX) 600 MG 12 hr tablet Take 1,200 mg by mouth 2 (two) times daily.    Marland Kitchen LORazepam (ATIVAN) 0.5 MG tablet TAKE (1) TABLET DAILY AS NEEDED.  Marland Kitchen PROAIR HFA 108 (90 Base) MCG/ACT inhaler 2 PUFFS EVERY 6 HOURS AS NEEDED FOR WHEEZING OR SHORTNESS OF BREATH  . terazosin (HYTRIN) 5 MG capsule TAKE (1) CAPSULE DAILY  . Vitamin D, Ergocalciferol, (DRISDOL) 50000 units CAPS capsule TAKE ONE CAPSULE BY MOUTH ONE TIME PER WEEK   No facility-administered encounter medications on file as of 10/13/2017.     Activities of Daily Living In your present state of health, do you have any difficulty performing the following activities: 10/13/2017  Hearing? N  Vision? N  Difficulty concentrating or making decisions? N  Walking or climbing stairs? N  Dressing or bathing? N  Doing errands, shopping? N  Preparing Food and eating ? N  Using the Toilet? N  In the past six months, have you accidently leaked urine? N  Do you have problems with loss of bowel control? N  Managing your Medications? N  Managing your Finances? N  Housekeeping or managing your Housekeeping? N  Some recent data might be hidden    Patient Care Team: Chipper Herb, MD as PCP - General (Family Medicine) [name], MD as Consulting Physician (Optometry) Hayden Pedro, MD as Consulting Physician (Ophthalmology) Jarome Matin, MD as Consulting Physician (Dermatology) Minus Breeding, MD as Consulting Physician (Cardiology)   Assessment:   This is a routine wellness examination for [name].  Exercise Activities and Dietary recommendations Current Exercise Habits: The patient does not participate in regular exercise at present, Exercise limited by: cardiac condition(s);respiratory conditions(s)  Goals    . DIET - EAT MORE FRUITS AND VEGETABLES    . Follow up  with Primary Care Provider       Fall Risk Fall Risk  10/13/2017 09/29/2017 05/12/2017 12/15/2016 08/10/2016  Falls in the past year? No No No No No   Is the patient's home free of loose throw rugs in walkways, pet beds, electrical cords, etc?   Yes  Grab bars in the bathroom? Yes      Handrails on the stairs?   Yes      Adequate lighting?   Yes   Depression Screen PHQ 2/9 Scores 10/13/2017 09/29/2017 05/12/2017 12/15/2016  PHQ - 2 Score 0 0 0 0    Cognitive Function MMSE - Mini Mental State Exam 10/13/2017 10/24/2015 10/19/2014  Orientation to time 5 5 5   Orientation to Place 5 5 5   Registration 3 3 3   Attention/ Calculation 5 4 4   Recall 1 2 3   Language- name 2 objects 2 2 2   Language- repeat 1 1 1   Language- follow 3 step command 3 3 3   Language- read & follow direction 1 1 1   Write a sentence 1 1 1   Copy design 1 1 1   Total score 28 28 29     Patient performed well on his MMSE, scoring 28 out of 30 available points.  Immunization History  Administered Date(s) Administered  . Influenza Split 12/08/2010, 12/08/2011  . Influenza Whole 12/26/2008  . Influenza, High Dose Seasonal PF 12/13/2015, 12/15/2016  . Influenza,inj,Quad PF,6+ Mos 12/26/2012, 12/21/2014  . Influenza,inj,quad, With Preservative 12/05/2013  . Pneumococcal Polysaccharide-23 03/09/1994  . Td 03/09/2005, 11/07/2014  . Zoster 01/08/2007    Screening Tests Health Maintenance  Topic Date Due  . INFLUENZA VACCINE  10/07/2017  . PNA vac Low Risk Adult (2 of 2 - PCV13) 09/30/2018 (Originally 03/10/1995)  . TETANUS/TDAP  11/06/2024   Cancer Screenings: Lung: Low Dose CT Chest recommended if Age 63-80 years, 30 pack-year currently smoking OR have quit w/in 15years. Patient does not qualify. Colorectal: past age        Plan:   Follow up with PCP  I have personally reviewed and noted the following in the patient's chart:   . Medical and social history . Use of alcohol, tobacco or illicit drugs  . Current  medications and supplements . Functional ability and status . Nutritional status . Physical activity . Advanced directives . List of other physicians . Hospitalizations, surgeries, and ER visits in previous 12 months . Vitals . Screenings to include cognitive, depression, and falls . Referrals and appointments  In addition, I have reviewed and discussed with patient certain preventive protocols, quality metrics, and best practice recommendations. A written personalized care plan for preventive services as well as general preventive health recommendations were provided to patient.     Burnadette Pop, LPN  09/07/6201  I have reviewed and agree with the above AWV documentation.  Arrie Senate MD

## 2017-10-27 ENCOUNTER — Other Ambulatory Visit: Payer: Self-pay | Admitting: Family Medicine

## 2017-10-27 ENCOUNTER — Encounter: Payer: PPO | Admitting: *Deleted

## 2017-11-11 ENCOUNTER — Other Ambulatory Visit: Payer: Self-pay | Admitting: Family Medicine

## 2017-12-17 DIAGNOSIS — K210 Gastro-esophageal reflux disease with esophagitis: Secondary | ICD-10-CM | POA: Diagnosis not present

## 2017-12-17 DIAGNOSIS — M542 Cervicalgia: Secondary | ICD-10-CM | POA: Diagnosis not present

## 2017-12-17 DIAGNOSIS — J411 Mucopurulent chronic bronchitis: Secondary | ICD-10-CM | POA: Diagnosis not present

## 2017-12-17 DIAGNOSIS — N401 Enlarged prostate with lower urinary tract symptoms: Secondary | ICD-10-CM | POA: Diagnosis not present

## 2018-01-05 ENCOUNTER — Other Ambulatory Visit: Payer: Self-pay | Admitting: *Deleted

## 2018-01-05 NOTE — Patient Outreach (Signed)
[place] [facility]) Care Management  01/05/2018  Carlos Brooks 1926-11-17 [id number]   TELEPHONE SCREENING Referral date: 01/04/18 Referral source: [employer] Referral reason: medication assistance Insurance: [employer]  Telephone call to patient regarding [employer] referral for medication assistance. HIPAA verified. Explained to patient the reason for the call. Explained to patient what [employer] is and the services we offer. Patient reports that he gets "bronchitis" every year and Dr. Luan Pulling gives him a shot and some medications and he gets better. Patient reports that he saw Dr. Luan Pulling approximately three weeks ago for his "bronchitis". Patient reports he received his "usual shot" and prescriptions for prednisone and cephalexin. Patient reports that he has a follow up appointment with Dr. Luan Pulling in March of 2020. Patient reports he sees Dr. Luan Pulling every six months. Patient reports that he has finished his prednisone. Patient reports that he has COPD and he takes anoro elipt areo inhaler 62.5-25 daily and albuteral inhaler three times daily. Patient reports that he has problems financially affording these two inhalers. Patient reports he has all of his medications and is taking them as prescribed. Patient agreeable to [facility] pharmacist giving him a call to work with patient to see if there are any resources that could help patient afford his inhalers. Patient reports that he is going to Dr. Tawanna Sat office tomorrow for his flu shot. Patient drives himself to his appointments. Patient also reports a history of right leg knee to foot tingling, numbness, and cramping that he has when he "lays down". Patient reports that this has been noted over the last three months and has not changed. Patient reports his MD is aware of this and patient states, "My doctor gives me a shot and it gets better".  Patient given 24 hour nurse call line number and  explained to patient what it is for. Patient instructed that if he has worsening SOB, chest pain or any other emergent signs or symptoms to call 911 and if his "bronchitis" signs and symptoms don't improve call MD office for follow up.   PLAN:  RN CM will order [employer] pharmacist to assist patient with medication assistance resources. Will send out to patient [facility] pamphlet and 24 hour nurse line magnet.   Lanae Boast RN BSN [employer], molecular Dial:  [phone] Fax: [phone] [place] [address] floor,   [place],  [place]  [address]  Website:  [email or web address]

## 2018-01-06 ENCOUNTER — Ambulatory Visit: Payer: PPO

## 2018-01-10 ENCOUNTER — Other Ambulatory Visit: Payer: Self-pay

## 2018-01-10 ENCOUNTER — Other Ambulatory Visit: Payer: Self-pay | Admitting: Pharmacy Technician

## 2018-01-10 NOTE — Patient Outreach (Signed)
[place] [facility]) Care Management  01/10/2018  Carlos Brooks [birth year] [id number]   Received [place] patient assistance referral from [place] for Ventolin HFA and Anora Ellipta. Prepared patient portion to be mailed and faxed provider portion to Dr. Laurance Flatten.  Will follow up with patient in 5-7 business days to confirm application has been received.  Carlos Brooks Certified Pharmacy Technician [place] Management Direct [phone]

## 2018-01-10 NOTE — Patient Outreach (Signed)
[place] [facility]) Care Management  [place]   01/10/2018  Carlos Brooks 1926-07-01 [id number]  Reason for referral: medication assistance  Referral medication(s): Anoro Ellipta and Proair Inhaler Current insurance: HTA  PMHx: aortic atherosclerosis, COPD, GERD, neuropathy, osteoarthritis and hyperlipidemia  HPI:  Carlos Brooks reports that he cannot afford his $90 Anoro Inhaler.  He states that he does have an inhaler now, but is not sure he can purchase another one when needed.  He reports that he uses his albuterol inhaler about three times day.  Carlos Brooks states he has not used his nebulizer in years and does not know if it works.    Objective: Allergies  Allergen Reactions  . Clarithromycin   . Crestor [Rosuvastatin Calcium]   . Lipitor [Atorvastatin Calcium]   . Niaspan [Niacin Er]   . Pneumovax [Pneumococcal Polysaccharide Vaccine]     Arm swelling and rash  . Pravachol   . Ranitidine Hcl   . Simvastatin     Medications Reviewed Today    Reviewed by Dionne Milo, [facility] (Pharmacist) on 01/10/18 at 1146  Med List Status: <None>  Medication Order Taking? Sig Documenting Provider Last Dose Status Informant  ANORO ELLIPTA 62.5-25 MCG/INH AEPB [id number] Yes Inhale 1 puff into the lungs daily.  [provider] Taking Active   aspirin 81 MG EC tablet [id number] Yes Take 81 mg by mouth daily.   [provider] Taking Active   finasteride (PROSCAR) 5 MG tablet [id number] Yes TAKE 1 TABLET DAILY Chipper Herb, MD Taking Active   LORazepam (ATIVAN) 0.5 MG tablet [id number] No TAKE (1) TABLET DAILY AS NEEDED.  Patient not taking:  Reported on 01/10/2018   Chipper Herb, MD Not Taking Active   PROAIR HFA 108 [phone] Base) MCG/ACT inhaler [id number] Yes 2 PUFFS EVERY 6 HOURS AS NEEDED FOR WHEEZING OR SHORTNESS OF Melina Fiddler, MD Taking Active   terazosin (HYTRIN) 5 MG capsule [id number] Yes TAKE (1) CAPSULE DAILY Chipper Herb,  MD Taking Active   Vitamin D, Ergocalciferol, (DRISDOL) 50000 units CAPS capsule [id number] Yes TAKE 1 CAPSULE ONCE A WEEK  Patient taking differently:  Takes on Saturday   Chipper Herb, MD Taking Active           Assessment:  Drugs sorted by system:  Neurologic/Psychologic:lorazepam  Cardiovascular: aspirin 81 mg  Pulmonary/Allergy: umeclidinium/vilanterol, albuterol MDI  Infectious Diseases: vitamin D  Genitourinary:finasteride, terazosin  Medication Assistance Findings:  Extra Help:   []  Already receiving Full Extra Help  []  Already receiving Partial Extra Help  []  Eligible based on reported income and assets  [x]  Not Eligible based on reported income and assets  Patient Assistance Programs: 1) Proventil HFA Inhaler made by [employer] o Income requirement met: [x]  Yes []  No []  Unknown o Out-of-pocket prescription expenditure met:    []  Yes []  No  []  Unknown  [x]  Not applicable        2)  Anoro Ellipta made by [place] o Income requirement met: [x]  Yes []  No  []  Unknown o Out-of-pocket prescription expenditure met:   []  Yes [x]  No   []  Unknown []  Not applicable - $193.79 - needs to spend $600  Plan: I will route patient assistance letter to [place] technician who will coordinate patient assistance program application process for medications listed above.  [facility] pharmacy technician will assist with obtaining all required documents from both patient and provider(s) and submit application(s) once completed.  Joetta Manners, PharmD  [place] [phone]

## 2018-01-11 ENCOUNTER — Other Ambulatory Visit: Payer: Self-pay | Admitting: *Deleted

## 2018-01-11 MED ORDER — ALBUTEROL SULFATE HFA 108 (90 BASE) MCG/ACT IN AERS: INHALATION_SPRAY | RESPIRATORY_TRACT | 11 refills | Status: DC

## 2018-01-11 MED ORDER — ANORO ELLIPTA 62.5-25 MCG/INH IN AEPB: 1.0000 | INHALATION_SPRAY | Freq: Every day | RESPIRATORY_TRACT | 11 refills | Status: DC

## 2018-01-12 ENCOUNTER — Ambulatory Visit (INDEPENDENT_AMBULATORY_CARE_PROVIDER_SITE_OTHER): Payer: PPO

## 2018-01-12 DIAGNOSIS — Z23 Encounter for immunization: Secondary | ICD-10-CM

## 2018-01-17 ENCOUNTER — Ambulatory Visit: Payer: Self-pay | Admitting: Pharmacy Technician

## 2018-01-18 ENCOUNTER — Other Ambulatory Visit: Payer: Self-pay | Admitting: Family Medicine

## 2018-01-18 ENCOUNTER — Other Ambulatory Visit: Payer: Self-pay | Admitting: Pharmacy Technician

## 2018-01-18 NOTE — Patient Outreach (Signed)
[place] [facility]) Care Management  01/18/2018  Carlos Brooks 1926-06-12 [id number]   Successful outreach call, HIPAA identifiers verified. Carlos Brooks confirmed that he received his patient assistance application and that his daughter helped him fill out documents. He "hopes" to have them in the mail tomorrow.  Will follow up with patient in 10-14 business days if documents have not been received.  Maud Deed [name] [place] Certified Pharmacy Technician [place] Management Direct [phone]

## 2018-01-24 ENCOUNTER — Other Ambulatory Visit: Payer: Self-pay | Admitting: Pharmacy Technician

## 2018-01-24 ENCOUNTER — Telehealth: Payer: Self-pay | Admitting: Family Medicine

## 2018-01-24 NOTE — Patient Outreach (Signed)
[place] [facility]) Care Management  01/24/2018  KALIEL BOLDS [birth year] [id number]   Received patient portion of [place] application for Ventolin and Anora Ellipta. FAxed completed application and required documents into the company.  Will follow up with company in 2-3 business days to check status of application.  Maud Deed [name] [place] Certified Pharmacy Technician [place] Management Direct Dial:[phone]

## 2018-01-26 NOTE — Telephone Encounter (Signed)
Rx's faxed and received by Caryl Pina w/ [facility]

## 2018-01-28 ENCOUNTER — Other Ambulatory Visit: Payer: Self-pay | Admitting: Pharmacy Technician

## 2018-01-28 NOTE — Patient Outreach (Signed)
[place] [facility]) Care Management  01/28/2018  DMARIO RUSSOM 1926-08-28 [id number]   Follow up call to check status of patients Anoro Ellipta and Ventolin HFA application. Mickel Baas states that an updated EOB needs to be submitted to show patient has reached $600 OOP requirement. Obtained new document and refaxed into company.  Will follow up with company in 2-3 business days to check status.  Maud Deed [name] [place] Certified Pharmacy Technician [place] Management Direct Dial:[phone]

## 2018-01-29 ENCOUNTER — Other Ambulatory Visit: Payer: Self-pay | Admitting: Family Medicine

## 2018-02-01 ENCOUNTER — Other Ambulatory Visit: Payer: Self-pay | Admitting: Pharmacy Technician

## 2018-02-01 NOTE — Patient Outreach (Signed)
[place] [facility]) Care Management  02/01/2018  Carlos Brooks May 09, 1926 [id number]   Follow up call to [place] to check status of application for Anoro and Ventolin. Estill Bamberg confirmed patient has been approved as of 11/25 until 03/08/2018. She also stated patient will receive a 90 day supply in the next 2 weeks.  Successful call to patient, HIPAA identifiers verified. Informed him of update.  Will follow up with patient in 10-14 business days to confirm medication has been received.  Maud Deed [name] [place] Certified Pharmacy Technician [place] Management Direct [phone]

## 2018-02-07 ENCOUNTER — Ambulatory Visit: Payer: PPO | Admitting: Family Medicine

## 2018-02-07 ENCOUNTER — Other Ambulatory Visit: Payer: Self-pay | Admitting: Family Medicine

## 2018-02-09 ENCOUNTER — Ambulatory Visit: Payer: PPO | Admitting: Family Medicine

## 2018-02-10 DIAGNOSIS — H2513 Age-related nuclear cataract, bilateral: Secondary | ICD-10-CM | POA: Diagnosis not present

## 2018-02-10 DIAGNOSIS — H40033 Anatomical narrow angle, bilateral: Secondary | ICD-10-CM | POA: Diagnosis not present

## 2018-02-16 ENCOUNTER — Other Ambulatory Visit: Payer: Self-pay | Admitting: Family Medicine

## 2018-02-16 NOTE — Telephone Encounter (Signed)
Last seen 09/29/17

## 2018-02-23 ENCOUNTER — Other Ambulatory Visit: Payer: Self-pay | Admitting: Pharmacy Technician

## 2018-02-23 ENCOUNTER — Other Ambulatory Visit: Payer: Self-pay

## 2018-02-23 NOTE — Patient Outreach (Signed)
[place] [facility]) Care Management  02/23/2018  [name] 21-Apr-1926 [id number]    Successful call placed to patient regarding patient assistance medication receipt from [place], HIPAA identifiers verified. [name] confirms that he has received a month supply of Anoro Ellipta and Ventolin HFA. Reviewed with patient that the program ends 03/08/18 and he would need to spend the required $600 OOP in order to reapply for the program next year. Informed him that he can reach out to me to help him with the process.  Will route note to [place] for case closure.  Maud Deed [name] [place] Certified Pharmacy Technician [place] Management Direct Dial:[phone]

## 2018-02-23 NOTE — Patient Outreach (Addendum)
[place] [facility] Care Management  [place]  02/23/2018  CHEROKEE CLOWERS 1926/04/23 [id number]  Incoming call received from [place], Etter Sjogren.  She had just spoken with Mr. Brabson and was concerned that he was having some shortness of breath.   She also reported that he had a little trouble stating his birthday.   Successful outreach call placed to Mr. Spellman.  HIPAA identifiers verified.  Mr. Loudermilk was able to tell me his birthday without difficulty.  He stated that he does have COPD and the wet weather makes him feel "croupy," but that he doesn't think that his breathing is worse than usual.  He was able to correctly tell me how he uses his inhalers.  He states that he does have some phlegm, but it is not discolored or smelly.  It is his usual.   Informed him to call Dr. Laurance Flatten or Dr. Luan Pulling if he does notice that it is becoming more difficult for him to breath.  He verbalized understanding.  Joetta Manners, PharmD Clinical Pharmacist [place] [phone]  Addendum: Randa Ngo message received from [place], Etter Sjogren stating that Mr. Lanuza has received his Anoro and Ventolin HFA patient assistance. Patient is aware that he would need to spend the required $600 OOP in order to reapply for the program next year. Informed him that he can reach out to me to help him with the process.  Plan: Close [place] case.  Route discipline closure letter to Dr. Laurance Flatten.    Joetta Manners, PharmD Clinical Pharmacist [place] [phone]

## 2018-03-17 ENCOUNTER — Other Ambulatory Visit: Payer: Self-pay | Admitting: Family Medicine

## 2018-03-18 ENCOUNTER — Encounter: Payer: Self-pay | Admitting: Family Medicine

## 2018-03-18 ENCOUNTER — Ambulatory Visit (INDEPENDENT_AMBULATORY_CARE_PROVIDER_SITE_OTHER): Payer: PPO | Admitting: Family Medicine

## 2018-03-18 VITALS — BP 142/64 | HR 61 | Temp 96.4°F | Ht 67.0 in | Wt 136.0 lb

## 2018-03-18 DIAGNOSIS — E785 Hyperlipidemia, unspecified: Secondary | ICD-10-CM | POA: Diagnosis not present

## 2018-03-18 DIAGNOSIS — M159 Polyosteoarthritis, unspecified: Secondary | ICD-10-CM

## 2018-03-18 DIAGNOSIS — I700 Atherosclerosis of aorta: Secondary | ICD-10-CM | POA: Diagnosis not present

## 2018-03-18 DIAGNOSIS — K219 Gastro-esophageal reflux disease without esophagitis: Secondary | ICD-10-CM

## 2018-03-18 DIAGNOSIS — J449 Chronic obstructive pulmonary disease, unspecified: Secondary | ICD-10-CM | POA: Diagnosis not present

## 2018-03-18 DIAGNOSIS — M150 Primary generalized (osteo)arthritis: Secondary | ICD-10-CM | POA: Diagnosis not present

## 2018-03-18 DIAGNOSIS — E559 Vitamin D deficiency, unspecified: Secondary | ICD-10-CM

## 2018-03-18 MED ORDER — LORAZEPAM 0.5 MG PO TABS: ORAL_TABLET | ORAL | 5 refills | Status: DC

## 2018-03-18 NOTE — Patient Instructions (Addendum)
Medicare Annual Wellness Visit  [place] and the medical providers at [facility] strive to bring you the best medical care.  In doing so we not only want to address your current medical conditions and concerns but also to detect new conditions early and prevent illness, disease and health-related problems.    Medicare offers a yearly Wellness Visit which allows our clinical staff to assess your need for preventative services including immunizations, lifestyle education, counseling to decrease risk of preventable diseases and screening for fall risk and other medical concerns.    This visit is provided free of charge (no copay) for all Medicare recipients. The clinical pharmacists at [facility] have begun to conduct these Wellness Visits which will also include a thorough review of all your medications.    As you primary medical provider recommend that you make an appointment for your Annual Wellness Visit if you have not done so already this year.  You may set up this appointment before you leave today or you may call back ([phone]) and schedule an appointment.  Please make sure when you call that you mention that you are scheduling your Annual Wellness Visit with the clinical pharmacist so that the appointment may be made for the proper length of time.     Continue current medications. Continue good therapeutic lifestyle changes which include good diet and exercise. Fall precautions discussed with patient. If an FOBT was given today- please return it to our front desk. If you are over 59 years old - you may need Prevnar 67 or the adult Pneumonia vaccine.  **Flu shots are available--- please call and schedule a FLU-CLINIC appointment**  After your visit with [place] today you will receive a survey in the mail or online from [employer] regarding your care with [place]. Please take a moment to fill this out. Your feedback is very  important to [place] as you can help [place] better understand your patient needs as well as improve your experience and satisfaction. WE CARE ABOUT YOU!!!   Take Mucinex maximum strength, plain, 1 twice daily with a large glass of water on a regular basis.  Check with the pharmacist about the generic version of this. Continue with neuro as recommended by Dr. Luan Pulling and as needed use of your rescue inhaler albuterol. Avoid irritating environments. Drink plenty of water and fluids and stay well-hydrated. Take Pepcid AC over-the-counter 1 twice daily for reflux symptoms as reflux may be contributing somewhat to your cough.  Take this on a regular basis along with using the inhalers and the Mucinex. . Use of nasal saline will also help nasal congestion, air no spray 3 or 4 times a day to each nostril should also be done.

## 2018-03-18 NOTE — Progress Notes (Signed)
Subjective:    Patient ID: Carlos Brooks, male    DOB: 17-Jun-1926, 85 y.o.   MRN: [id number]  HPI Pt here for follow up and management of chronic medical problems which includes hyperlipidemia. He is taking medication regularly.  Patient has severe COPD and an enlarged prostate atherosclerosis of a order hyperlipidemia and reflux disease.  He complains today of congestion.  He does see the pulmonologist on a regular basis.  He is requesting refills on his Lorazepam.  Dastan is a special person and he has been wonderful to his wife's children and grandchildren and incredible role model not only to his family but to those at no him.  He always seems sort of fatigued and laid back and basically our treatment of him has been to reassure him and not give him any medicine that would make him worse.  He has a tendency not to use his inhalers as regularly as he should for what ever reason I am not sure.  Patient's mother died of ovarian cancer and his father died of lung cancer.  He has been intolerant to statins in the past.  He is using an Anoro inhaler now and an albuterol pro-air inhaler as a rescue.  He is taking Proscar for his prostate uses Lorazepam for his anxiety and taking vitamin D 50,000 units weekly.  The patient is pleasant and calm and has somewhat increasing inability to directly express himself well.  He is a caregiver for his wife.  His family is supportive of them getting to see their doctors.  Today he denies any chest pain.  He has shortness of breath and congestion as usual.  He denies any trouble with his stomach other than occasional reflux issues.  He is currently not taking anything for this.  He does have occasional bouts with loose stools but no more than usual.  He denies any blood in the stool or black tarry bowel movements.  He is passing his water well but slow.  He no longer sees the urologist.    Patient Active Problem List   Diagnosis Date Noted  . LBBB (left bundle  branch block) 07/21/2017  . Primary osteoarthritis involving multiple joints 08/10/2016  . Back pain 08/10/2016  . Neck pain 08/10/2016  . Neuropathy 10/21/2015  . Aortic atherosclerosis ([place]) 10/21/2015  . Dyspnea 08/07/2015  . DYSPHAGIA UNSPECIFIED 06/20/2007  . Allergic rhinitis 04/28/2007  . LUNG NODULE 04/28/2007  . Hyperlipidemia LDL goal <100 03/28/2007  . COPD (chronic obstructive pulmonary disease) with chronic bronchitis ([place]) 03/28/2007  . GERD 03/28/2007  . BPH (benign prostatic hyperplasia) 03/28/2007   Outpatient Encounter Medications as of 03/18/2018  Medication Sig  . albuterol (PROAIR HFA) 108 (90 Base) MCG/ACT inhaler 2 PUFFS EVERY 6 HOURS AS NEEDED FOR WHEEZING OR SHORTNESS OF BREATH  . ANORO ELLIPTA 62.5-25 MCG/INH AEPB Inhale 1 puff into the lungs daily.  Marland Kitchen aspirin 81 MG EC tablet Take 81 mg by mouth daily.    . finasteride (PROSCAR) 5 MG tablet TAKE 1 TABLET DAILY  . LORazepam (ATIVAN) 0.5 MG tablet TAKE (1) TABLET DAILY AS NEEDED.  Marland Kitchen terazosin (HYTRIN) 5 MG capsule TAKE (1) CAPSULE DAILY  . Vitamin D, Ergocalciferol, (DRISDOL) 1.25 MG (50000 UT) CAPS capsule TAKE 1 CAPSULE ONCE A WEEK   No facility-administered encounter medications on file as of 03/18/2018.       Review of Systems  Constitutional: Negative.   HENT: Positive for congestion.   Eyes: Negative.   Respiratory:  Negative.   Cardiovascular: Negative.   Gastrointestinal: Negative.   Endocrine: Negative.   Genitourinary: Negative.   Musculoskeletal: Negative.   Skin: Negative.   Allergic/Immunologic: Negative.   Neurological: Negative.   Hematological: Negative.   Psychiatric/Behavioral: Negative.        Objective:   Physical Exam Vitals signs and nursing note reviewed.  Constitutional:      Appearance: Normal appearance. He is well-developed and normal weight. He is not ill-appearing.     Comments: Pleasant and alert and quiet spoken and appears much younger than his stated age of 5    HENT:     Head: Normocephalic and atraumatic.     Right Ear: Tympanic membrane, ear canal and external ear normal.     Left Ear: Tympanic membrane, ear canal and external ear normal.     Nose: Nose normal. No congestion.     Mouth/Throat:     Mouth: Mucous membranes are moist.     Pharynx: Oropharynx is clear. No oropharyngeal exudate.  Eyes:     General: No scleral icterus.       Right eye: No discharge.        Left eye: No discharge.     Extraocular Movements: Extraocular movements intact.     Conjunctiva/sclera: Conjunctivae normal.     Pupils: Pupils are equal, round, and reactive to light.     Comments: Up-to-date on eye exams  Neck:     Musculoskeletal: Normal range of motion and neck supple.     Thyroid: No thyromegaly.     Vascular: No carotid bruit.     Trachea: No tracheal deviation.     Comments: No bruits thyromegaly or anterior cervical adenopathy Cardiovascular:     Rate and Rhythm: Normal rate and regular rhythm.     Pulses: Normal pulses.     Heart sounds: Normal heart sounds. No murmur. No gallop.      Comments: Heart is regular at 60/min and good pedal pulses Pulmonary:     Effort: Pulmonary effort is normal. No respiratory distress.     Breath sounds: Wheezing present. No rales.     Comments: Some expiratory wheezes and dry raspy cough.  No axillary adenopathy or chest wall masses Chest:     Chest wall: No tenderness.  Abdominal:     General: Abdomen is flat. Bowel sounds are normal.     Palpations: Abdomen is soft. There is no mass.     Tenderness: There is abdominal tenderness. There is no guarding.  Musculoskeletal: Normal range of motion.        General: No tenderness.     Right lower leg: No edema.     Left lower leg: No edema.  Lymphadenopathy:     Cervical: No cervical adenopathy.  Skin:    General: Skin is warm and dry.     Findings: No rash.  Neurological:     General: No focal deficit present.     Mental Status: He is alert and oriented  to person, place, and time. Mental status is at baseline.     Cranial Nerves: No cranial nerve deficit.     Deep Tendon Reflexes: Reflexes are normal and symmetric.     Comments: Patient does have some increasing expressive aphasia, probably not much more than previously.  Psychiatric:        Mood and Affect: Mood normal.        Behavior: Behavior normal.        Thought Content: Thought  content normal.        Judgment: Judgment normal.     Comments: Mood affect and behavior are normal for this patient    BP (!) 142/64 (BP Location: Left Arm)   Pulse 61   Temp (!) 96.4 F (35.8 C) (Oral)   Ht 5' 7"  (1.702 m)   Wt 136 lb (61.7 kg)   BMI 21.30 kg/m        Assessment & Plan:  1. Hyperlipidemia LDL goal <100 -Continue with as aggressive therapeutic lifestyle changes as possible including diet and exercise as much as possible for current age as patient has been statin intolerant in the past. - CBC with Differential/Platelet - Lipid panel  2. Gastroesophageal reflux disease, esophagitis presence not specified -Take Pepcid AC twice daily before breakfast and supper on a more regular basis - CBC with Differential/Platelet - Hepatic function panel  3. Thoracic aorta atherosclerosis ([place]) -Continue aggressive therapeutic lifestyle changes - BMP8+EGFR - CBC with Differential/Platelet  4. Vitamin D deficiency -Continue to take vitamin D replacement pending results of lab work - CBC with Differential/Platelet - VITAMIN D 25 Hydroxy (Vit-D Deficiency, Fractures)  5. Primary osteoarthritis involving multiple joints -Take only Tylenol for aches pains and fever.  6. COPD (chronic obstructive pulmonary disease) with chronic bronchitis (HCC) -Continue with regular use of Norco, albuterol, Mucinex twice daily with a large glass of water and take Pepcid AC before breakfast and supper  Meds ordered this encounter  Medications  . LORazepam (ATIVAN) 0.5 MG tablet    Sig: TAKE (1) TABLET  DAILY AS NEEDED.    Dispense:  30 tablet    Refill:  5    $   Patient Instructions                       Medicare Annual Wellness Visit  [place] and the medical providers at [facility] strive to bring you the best medical care.  In doing so we not only want to address your current medical conditions and concerns but also to detect new conditions early and prevent illness, disease and health-related problems.    Medicare offers a yearly Wellness Visit which allows our clinical staff to assess your need for preventative services including immunizations, lifestyle education, counseling to decrease risk of preventable diseases and screening for fall risk and other medical concerns.    This visit is provided free of charge (no copay) for all Medicare recipients. The clinical pharmacists at [facility] have begun to conduct these Wellness Visits which will also include a thorough review of all your medications.    As you primary medical provider recommend that you make an appointment for your Annual Wellness Visit if you have not done so already this year.  You may set up this appointment before you leave today or you may call back ([phone]) and schedule an appointment.  Please make sure when you call that you mention that you are scheduling your Annual Wellness Visit with the clinical pharmacist so that the appointment may be made for the proper length of time.     Continue current medications. Continue good therapeutic lifestyle changes which include good diet and exercise. Fall precautions discussed with patient. If an FOBT was given today- please return it to our front desk. If you are over 55 years old - you may need Prevnar 55 or the adult Pneumonia vaccine.  **Flu shots are available--- please call and schedule a FLU-CLINIC  appointment**  After your visit with [place] today you will receive a survey in the mail or online from [employer]  regarding your care with [place]. Please take a moment to fill this out. Your feedback is very important to [place] as you can help [place] better understand your patient needs as well as improve your experience and satisfaction. WE CARE ABOUT YOU!!!   Take Mucinex maximum strength, plain, 1 twice daily with a large glass of water on a regular basis.  Check with the pharmacist about the generic version of this. Continue with neuro as recommended by Dr. Luan Pulling and as needed use of your rescue inhaler albuterol. Avoid irritating environments. Drink plenty of water and fluids and stay well-hydrated. Take Pepcid AC over-the-counter 1 twice daily for reflux symptoms as reflux may be contributing somewhat to your cough.  Take this on a regular basis along with using the inhalers and the Mucinex. . Use of nasal saline will also help nasal congestion, air no spray 3 or 4 times a day to each nostril should also be done.  Arrie Senate MD

## 2018-03-19 LAB — BMP8+EGFR
BUN/Creatinine Ratio: 11 (ref 10–24)
BUN: 14 mg/dL (ref 8–27)
CALCIUM: 9.7 mg/dL (ref 8.6–10.2)
CO2: 23 mmol/L (ref 20–29)
Chloride: 99 mmol/L (ref 96–106)
Creatinine, Ser: 1.22 mg/dL (ref 0.76–1.27)
GFR calc Af Amer: 60 mL/min/{1.73_m2} (ref >59)
GFR, EST NON AFRICAN AMERICAN: 52 mL/min/{1.73_m2} — AB (ref >59)
Glucose: 89 mg/dL (ref 65–99)
POTASSIUM: 4.5 mmol/L (ref 3.5–5.2)
Sodium: 138 mmol/L (ref 134–144)

## 2018-03-19 LAB — CBC WITH DIFFERENTIAL/PLATELET
BASOS ABS: 0.2 10*3/uL (ref 0.0–0.2)
Basos: 2 %
EOS (ABSOLUTE): 1.5 10*3/uL — ABNORMAL HIGH (ref 0.0–0.4)
Eos: 18 %
Hematocrit: 42.2 % (ref 37.5–51.0)
Hemoglobin: 14.1 g/dL (ref 13.0–17.7)
Immature Grans (Abs): 0.1 10*3/uL (ref 0.0–0.1)
Immature Granulocytes: 1 %
Lymphocytes Absolute: 2.3 10*3/uL (ref 0.7–3.1)
Lymphs: 27 %
MCH: 29.9 pg (ref 26.6–33.0)
MCHC: 33.4 g/dL (ref 31.5–35.7)
MCV: 89 fL (ref 79–97)
MONOS ABS: 0.9 10*3/uL (ref 0.1–0.9)
Monocytes: 10 %
Neutrophils Absolute: 3.8 10*3/uL (ref 1.4–7.0)
Neutrophils: 42 %
Platelets: 171 10*3/uL (ref 150–450)
RBC: 4.72 x10E6/uL (ref 4.14–5.80)
RDW: 13.2 % (ref 11.6–15.4)
WBC: 8.7 10*3/uL (ref 3.4–10.8)

## 2018-03-19 LAB — HEPATIC FUNCTION PANEL
ALT: 11 IU/L (ref 0–44)
AST: 19 IU/L (ref 0–40)
Albumin: 4.3 g/dL (ref 3.5–4.7)
Alkaline Phosphatase: 56 IU/L (ref 39–117)
Bilirubin Total: 0.3 mg/dL (ref 0.0–1.2)
Bilirubin, Direct: 0.08 mg/dL (ref 0.00–0.40)
TOTAL PROTEIN: 6.5 g/dL (ref 6.0–8.5)

## 2018-03-19 LAB — LIPID PANEL
CHOLESTEROL TOTAL: 203 mg/dL — AB (ref 100–199)
Chol/HDL Ratio: 3.6 ratio (ref 0.0–5.0)
HDL: 56 mg/dL (ref >39)
LDL Calculated: 119 mg/dL — ABNORMAL HIGH (ref 0–99)
Triglycerides: 139 mg/dL (ref 0–149)
VLDL Cholesterol Cal: 28 mg/dL (ref 5–40)

## 2018-03-19 LAB — VITAMIN D 25 HYDROXY (VIT D DEFICIENCY, FRACTURES): Vit D, 25-Hydroxy: 49.9 ng/mL (ref 30.0–100.0)

## 2018-04-05 ENCOUNTER — Other Ambulatory Visit: Payer: Self-pay | Admitting: Family Medicine

## 2018-04-06 NOTE — Telephone Encounter (Signed)
Last Vit D 03/18/2018   49.9

## 2018-04-15 ENCOUNTER — Other Ambulatory Visit: Payer: Self-pay | Admitting: Family Medicine

## 2018-04-21 ENCOUNTER — Other Ambulatory Visit: Payer: Self-pay | Admitting: Family Medicine

## 2018-06-03 ENCOUNTER — Other Ambulatory Visit: Payer: Self-pay | Admitting: *Deleted

## 2018-06-03 NOTE — Patient Outreach (Signed)
[facility]) Care Management  06/03/2018  STRAN RAPER [birth year] [id number]   Subjective: Telephone call to patient's home number, spoke with patient, and HIPAA verified.  Discussed [facility] Care Management [employer] MD referral follow up, patient voiced understanding, and is in agreement to follow up.   Patient gave verbal consent to speak with wife Hatim Homann) regarding his healthcare needs as needed.  Spoke with patient's wife, discussed [facility] Care Management services, is in agreement to follow up on patient's behalf.  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  Wife planning to follow up with her physician regarding medication assistance.   Wife states patient is needing assistance with breathing medications/ inhaler, was able to get inhaler on 06/02/2018, but causing financial hardship, wife in agreement to referral to [place] for medication assistance (per referral needs assist with Anoro Ellipt Aer 62.5-25 ). Wife states she is almost blind, she receives services through the blind services, couple's daughter assist she, and patient as needed with activities of daily living / home management.   Wife states patient does not have any education material, transition of care, care coordination, disease management, disease monitoring, transportation, or community resource needs at this time.  States she is very appreciative of the follow up and is in agreement to receive [place] Management services on patient's behalf.   Objective: Per KPN (Knowledge Performance Now, point of care tool) and chart review, patient with no recent hospitalizations or ED visits.  Patient has a history of COPD, BPH, Hiatal Hernia, hyperlipidemia, Cataract, and skin cancer.     Assessment: Received Health[employer] MD  Referral on 05/31/2018.  Referral source: [facility] [phone].   Referral reason: Medication assistance with Anoro Ellipt Aer 62.5-25.   Screening follow up completed and will refer patient to [place] for medication assistance.    Plan: RNCM will refer patient to [facility] for medication assistance (per referral needs assist with Anoro Ellipt Aer 62.5-25 ).      Camile Esters H. Annia Friendly, BSN, [place] Management [facility] Telephonic CM Phone: [phone] Fax: [phone]

## 2018-06-07 ENCOUNTER — Ambulatory Visit: Payer: Self-pay | Admitting: Pharmacist

## 2018-06-07 ENCOUNTER — Other Ambulatory Visit: Payer: Self-pay | Admitting: Pharmacist

## 2018-06-07 ENCOUNTER — Other Ambulatory Visit: Payer: Self-pay

## 2018-06-07 NOTE — Patient Outreach (Signed)
[facility]) Care Management  [place]   06/07/2018  Carlos Brooks 05/13/1926 [id number]  Reason for referral: Medication Assistance  Referral source: [facility] RN Current insurance: [employer]  PMHx includes but not limited to:  COPD, GERD, BPH, HLD (statin intolerant)  Outreach:  Successful telephone call with Carlos Brooks.  HIPAA identifiers verified.  Patient states he is having trouble affording medications (inhalers).  His Anoro inhaler is $90 and the albuterol runs $45 each.  Patient agreeable to participate in patient assistance program.  He does not currently qualify for Anoro, however we can apply for an equivalent product --> Stiolto Respimat (LAMA/LABA).  Patient states he is compliant and his current breathing is at baseline.  He is staying in and well.  No other pharmacy concerns/needs at this time.  Does the patient ever forget to take medication?  no Does the patient have problems obtaining medications due to cost?  yes  Does the patient feel that medications prescribed are effective?  yes Does the patient ever experience any side effects to the medications prescribed?  no   Objective: Lab Results  Component Value Date   CREATININE 1.22 03/18/2018   CREATININE 1.12 09/29/2017   CREATININE 1.13 05/12/2017    BP Readings from Last 3 Encounters:  03/18/18 (!) 142/64  10/13/17 137/72  09/29/17 (!) 147/64    Medications Reviewed Today    Reviewed by Chipper Herb, MD (Physician) on 03/18/18 at [phone]  Med List Status: <None>  Medication Order Taking? Sig Documenting Provider Last Dose Status Informant  albuterol [facility] HFA) 108 (90 Base) MCG/ACT inhaler [id number] Yes 2 PUFFS EVERY 6 HOURS AS NEEDED FOR WHEEZING OR SHORTNESS OF Melina Fiddler, MD Taking Active   ANORO ELLIPTA 62.5-25 MCG/INH AEPB [id number] Yes Inhale 1 puff into the lungs daily. Chipper Herb, MD Taking Active   aspirin 81 MG EC tablet [id number] Yes Take  81 mg by mouth daily.   [provider] Taking Active   finasteride (PROSCAR) 5 MG tablet [id number] Yes TAKE 1 TABLET DAILY Chipper Herb, MD Taking Active   LORazepam (ATIVAN) 0.5 MG tablet [id number] Yes TAKE (1) TABLET DAILY AS NEEDED. Chipper Herb, MD Taking Active   terazosin (HYTRIN) 5 MG capsule [id number] Yes TAKE (1) CAPSULE DAILY Chipper Herb, MD Taking Active   Vitamin D, Ergocalciferol, (DRISDOL) 1.25 MG (50000 UT) CAPS capsule [id number] Yes TAKE 1 CAPSULE ONCE A WEEK Chipper Herb, MD Taking Active           Assessment:  Drugs sorted by system:  Neurologic/Psychologic: lorazepam  Cardiovascular: ASA,   Pulmonary/Allergy: Anoro Ellipta, Albuterol  Genitourinary: terazosin/finasterid  Vitamins/Minerals/Supplements: vitD   Medication Assistance Findings:  Extra Help:  Not eligible for Extra Help Low Income Subsidy based on reported income and assets  Patient Assistance Programs: Stiolto Respimat made by [employer] o Income requirement met: Yes o Out-of-pocket prescription expenditure met:   Not Applicable - Patient has met application requirements to apply for this patient assistance program.    Plan: . I will route patient assistance letter to [place] technician who will coordinate patient assistance program application process for medications listed above.  [facility] pharmacy technician will assist with obtaining all required documents from both patient and provider(s) and submit application(s) once completed.   Regina Eck, PharmD, [place]  [phone]

## 2018-06-09 ENCOUNTER — Other Ambulatory Visit: Payer: Self-pay | Admitting: Pharmacy Technician

## 2018-06-09 NOTE — Patient Outreach (Signed)
[facility]) Care Management  06/09/2018  KOLTIN WEHMEYER [birth year] [id number]                          Medication Assistance Referral  Referral From: [facility] R[facility] Jenne Pane.  Medication/Company: Gerald Stabs and Spiriva Respimat / [employer] Patient application portion:  Mailed Provider application portion: Faxed  to Dr. Alphonse Guild  Medication/Company: Proventil HFA / [employer] Patient application portion:  Mailed Provider application portion: Interoffice Mailed to Dr. Alphonse Guild  Follow up:  Will follow up with patient in 7-10 business days to confirm application(s) have been received.  Maud Deed [name] [place] Certified Pharmacy Technician [place] Management Direct Dial:[phone]

## 2018-06-10 ENCOUNTER — Other Ambulatory Visit: Payer: Self-pay | Admitting: *Deleted

## 2018-06-10 MED ORDER — TIOTROPIUM BROMIDE MONOHYDRATE 18 MCG IN CAPS: 18.0000 ug | ORAL_CAPSULE | Freq: Every day | RESPIRATORY_TRACT | 11 refills | Status: DC

## 2018-06-10 MED ORDER — OLODATEROL HCL 2.5 MCG/ACT IN AERS: 2.0000 | INHALATION_SPRAY | Freq: Every day | RESPIRATORY_TRACT | 11 refills | Status: DC

## 2018-06-22 ENCOUNTER — Other Ambulatory Visit: Payer: Self-pay | Admitting: Pharmacy Technician

## 2018-06-22 NOTE — Patient Outreach (Addendum)
[facility]) Care Management  06/22/2018  MARRIO SCRIBNER 02-04-1927 [id number]    Unsuccessful call #1 placed to patient regarding patient assistance application(s) for Proventil HFA , HIPAA compliant voicemail left. Received patient portions of patient assistance applications however, signature missing on 1 portion of application as well as proof of income.  Follow up:  Will make 2nd call attempt in 2-3 business days if call has not been returned.   Maud Deed [name] [place] Certified Pharmacy Technician [place] Management Direct Dial:[phone]    ADDENDUM 2:21pm  Return call from patient, informed patient of missing signature as well as missing documents. Patient's wife states that she will have daughter make copies of income and have Mr. Hippe sign additional line!  Will submit application to company once all documents have been received.  Maud Deed [name] [place] Certified Pharmacy Technician [place] Management Direct Dial:[phone]

## 2018-06-29 ENCOUNTER — Other Ambulatory Visit: Payer: Self-pay | Admitting: Family Medicine

## 2018-06-29 NOTE — Telephone Encounter (Signed)
What is the name of the medication? Nitroglycerine nasal spray  Have you contacted your pharmacy to request a refill? yes  Which pharmacy would you like this sent to? [facility]    Patient notified that their request is being sent to the clinical staff for review and that they should receive a call once it is complete. If they do not receive a call within 24 hours they can check with their pharmacy or our office.

## 2018-06-29 NOTE — Telephone Encounter (Signed)
Patient called to request medication refill for his wife.

## 2018-07-06 ENCOUNTER — Other Ambulatory Visit: Payer: Self-pay | Admitting: Pharmacist

## 2018-07-06 ENCOUNTER — Other Ambulatory Visit: Payer: Self-pay | Admitting: Pharmacy Technician

## 2018-07-06 NOTE — Addendum Note (Signed)
Addended by: Lottie Dawson D on: 07/06/2018 12:01 PM   Modules accepted: Orders

## 2018-07-06 NOTE — Patient Outreach (Signed)
[facility]) Care Management  07/06/2018  Carlos Brooks Mar 22, 1926 [id number]   Received patient portion(s) of patient assistance application for Stiolto and Proventil HFA. Prepared to mail Proventil HFA to [employer] and faxed completed application for [employer] and required documents into [employer].  Will follow up with [employer] in 5-7 business days and [employer] in 10-14 business days to check status of application.  Maud Deed [name] [place] Certified Pharmacy Technician [place] Management Direct Dial:[phone]

## 2018-07-06 NOTE — Patient Outreach (Signed)
[facility]) Care Management [place]  07/06/2018  Carlos Brooks [birth year] [id number]  Reason for referral: medication assistance (Stiolto which is replacing Anoro)  Updated patient's medication list to reflect new inhaler changes.  Patient is taking Anoro Ellipta and albuterol for COPD, however Anoro has become too expensive.  He will be transitioning to [employer] Respimat which contains equivalent components (LAMA/LABA) provided by manufacturer, [employer].  PLAN: -Will follow up as needed -Appreciate [facility] CPhT, Etter Sjogren following patient assistance process  Regina Eck, PharmD, [place]  [phone]

## 2018-07-13 ENCOUNTER — Other Ambulatory Visit: Payer: Self-pay | Admitting: Pharmacy Technician

## 2018-07-13 NOTE — Patient Outreach (Signed)
[facility]) Care Management  07/13/2018  Carlos Brooks August 31, 1926 [id number]    Follow up call placed to [employer] regarding patient assistance application(s) for Stiolto Respimat , [name] confirms patient has been approved as of 4/30 until 03/09/19. Medication to arrive at patient home in 7-10 business days.  Follow up:  Will follow up with patient in once update from [employer] for Proventil HFA has been received.  Maud Deed [name] [place] Certified Pharmacy Technician [place] Management Direct [phone]

## 2018-07-21 ENCOUNTER — Other Ambulatory Visit: Payer: Self-pay | Admitting: Family Medicine

## 2018-07-25 ENCOUNTER — Encounter: Payer: Self-pay | Admitting: Family Medicine

## 2018-07-25 ENCOUNTER — Other Ambulatory Visit: Payer: Self-pay

## 2018-07-25 ENCOUNTER — Ambulatory Visit (INDEPENDENT_AMBULATORY_CARE_PROVIDER_SITE_OTHER): Payer: PPO | Admitting: Family Medicine

## 2018-07-25 DIAGNOSIS — M159 Polyosteoarthritis, unspecified: Secondary | ICD-10-CM

## 2018-07-25 DIAGNOSIS — J449 Chronic obstructive pulmonary disease, unspecified: Secondary | ICD-10-CM

## 2018-07-25 DIAGNOSIS — E559 Vitamin D deficiency, unspecified: Secondary | ICD-10-CM

## 2018-07-25 DIAGNOSIS — E785 Hyperlipidemia, unspecified: Secondary | ICD-10-CM

## 2018-07-25 DIAGNOSIS — N400 Enlarged prostate without lower urinary tract symptoms: Secondary | ICD-10-CM

## 2018-07-25 DIAGNOSIS — M150 Primary generalized (osteo)arthritis: Secondary | ICD-10-CM

## 2018-07-25 DIAGNOSIS — I700 Atherosclerosis of aorta: Secondary | ICD-10-CM

## 2018-07-25 DIAGNOSIS — K219 Gastro-esophageal reflux disease without esophagitis: Secondary | ICD-10-CM | POA: Diagnosis not present

## 2018-07-25 MED ORDER — TIOTROPIUM BROMIDE MONOHYDRATE 18 MCG IN CAPS: 18.0000 ug | ORAL_CAPSULE | Freq: Every day | RESPIRATORY_TRACT | 11 refills | Status: DC

## 2018-07-25 MED ORDER — ALBUTEROL SULFATE HFA 108 (90 BASE) MCG/ACT IN AERS: INHALATION_SPRAY | RESPIRATORY_TRACT | 11 refills | Status: DC

## 2018-07-25 NOTE — Addendum Note (Signed)
Addended by: Zannie Cove on: 07/25/2018 01:02 PM   Modules accepted: Orders

## 2018-07-25 NOTE — Patient Instructions (Signed)
Continue to practice good hand and respiratory hygiene Continue to drink plenty of water Continue to use inhalers regularly Continue to use Mucinex for cough and congestion Continue to follow-up with the cardiologist as planned and the pulmonologist, Dr. Luan Pulling as planned Stay active as physically possible and always be careful to not put self at risk for falling

## 2018-07-25 NOTE — Progress Notes (Signed)
Virtual Visit Via telephone Note I connected with@ on 07/25/18 by telephone and verified that I am speaking with the correct person or authorized healthcare agent using two identifiers. Carlos Brooks is currently located at home and there are no unauthorized people in close proximity. I completed this visit while in a private location in my home .  This visit type was conducted due to national recommendations for restrictions regarding the COVID-19 Pandemic (e.g. social distancing).  This format is felt to be most appropriate for this patient at this time.  All issues noted in this document were discussed and addressed.  No physical exam was performed.    I discussed the limitations, risks, security and privacy concerns of performing an evaluation and management service by telephone and the availability of in person appointments. I also discussed with the patient that there may be a patient responsible charge related to this service. The patient expressed understanding and agreed to proceed.   Date:  07/25/2018    ID:  Carlos Brooks      [birth year]        [id number]   Patient Care Team Patient Care Team: Ernestina Penna, MD as PCP - General (Family Medicine) Michaelle Copas, MD as Consulting Physician (Optometry) Sherrie George, MD as Consulting Physician (Ophthalmology) Donzetta Starch, MD as Consulting Physician (Dermatology) Rollene Rotunda, MD as Consulting Physician (Cardiology) Danella Maiers, [facility] as [facility] Care Management (Pharmacist) Regan Rakers, CPhT as [facility] Care Management (Pharmacy Technician)  Reason for Visit: Primary Care Follow-up     History of Present Illness & Review of Systems:     Carlos Brooks is a 85 y.o. year old male primary care patient that presents today for a telehealth visit.  I have known Carlos Brooks for many years.  He has been a loyal patient a good husband a good father and grandfather and I  know all of his connections.  He is very calm and laid back.  Because his wife cannot see he has been devoted to her to make sure that he takes good care of her.  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  He has COPD GERD and BPH.  He continues to have a lot of arthritic complaints.  There is some confusion about his inhalers and it sounds like he is not doing the Stiolto or Respimat but doing Spiriva and stay plus his albuterol inhaler.  We will confirm that with [facility] pharmacy make sure that his refills are in place.  They he denies and only has minimal chest pain and no more than he thinks than usual.  His breathing is stable with his 2 inhalers.  His bowels are moving okay is having no trouble with swallowing heartburn indigestion nausea vomiting diarrhea or blood in the stool.  He is passing his water well but slow.  He sees Dr. Antoine Poche and Dr. Juanetta Gosling on a regular basis.  Review of systems as stated, otherwise negative.  The patient does not have symptoms concerning for COVID-19 infection (fever, chills, cough, or new shortness of breath).      Current Medications (Verified) Allergies as of 07/25/2018      Reactions   Clarithromycin    Crestor [rosuvastatin Calcium]    Lipitor [atorvastatin Calcium]    Niaspan [niacin Er]    Pneumovax [pneumococcal Polysaccharide Vaccine]    Arm swelling and rash  Pravachol    Ranitidine Hcl    Simvastatin       Medication List       Accurate as of Jul 25, 2018 10:11 AM. If you have any questions, ask your nurse or doctor.        albuterol 108 (90 Base) MCG/ACT inhaler Commonly known as:  ProAir HFA 2 PUFFS EVERY 6 HOURS AS NEEDED FOR WHEEZING OR SHORTNESS OF BREATH   aspirin 81 MG EC tablet Take 81 mg by mouth daily.   finasteride 5 MG tablet Commonly known as:  PROSCAR TAKE 1 TABLET DAILY   LORazepam 0.5 MG tablet Commonly known as:  ATIVAN TAKE (1) TABLET DAILY AS NEEDED.   Stiolto Respimat  2.5-2.5 MCG/ACT Aers Generic drug:  Tiotropium Bromide-Olodaterol Inhale 2 puffs into the lungs daily.   terazosin 5 MG capsule Commonly known as:  HYTRIN TAKE (1) CAPSULE DAILY   Vitamin D (Ergocalciferol) 1.25 MG (50000 UT) Caps capsule Commonly known as:  DRISDOL TAKE 1 CAPSULE ONCE A WEEK           Allergies (Verified)    Clarithromycin; Crestor [rosuvastatin calcium]; Lipitor [atorvastatin calcium]; Niaspan [niacin er]; Pneumovax [pneumococcal polysaccharide vaccine]; Pravachol; Ranitidine hcl; and Simvastatin  Past Medical History Past Medical History:  Diagnosis Date  . BPH (benign prostatic hyperplasia)   . Cancer (HCC)    skin  . Cataract   . COPD (chronic obstructive pulmonary disease) (HCC)   . Generalized headaches   . GERD (gastroesophageal reflux disease)   . Hiatal hernia   . Hyperlipidemia      Past Surgical History:  Procedure Laterality Date  . EYE SURGERY    . HERNIA REPAIR     x 3, inguinal     Social History   Socioeconomic History  . Marital status: Married    Spouse name: Not on file  . Number of children: 5  . Years of education: Not on file  . Highest education level: Not on file  Occupational History  . Not on file  Social Needs  . Financial resource strain: Not hard at all  . Food insecurity:    Worry: Never true    Inability: Never true  . Transportation needs:    Medical: No    Non-medical: No  Tobacco Use  . Smoking status: Former Smoker    Last attempt to quit: 04/13/1993    Years since quitting: 25.2  . Smokeless tobacco: Never Used  Substance and Sexual Activity  . Alcohol use: No  . Drug use: No  . Sexual activity: Never  Lifestyle  . Physical activity:    Days per week: 0 days    Minutes per session: 0 min  . Stress: Not at all  Relationships  . Social connections:    Talks on phone: More than three times a week    Gets together: More than three times a week    Attends religious service: More than 4 times per  year    Active member of club or organization: Yes    Attends meetings of clubs or organizations: More than 4 times per year    Relationship status: Married  Other Topics Concern  . Not on file  Social History Narrative  . Not on file     Family History  Problem Relation Age of Onset  . Cancer Mother        Windell Moment  . Cancer Father        lung  . Heart  attack Brother 75       Not clear details  . Heart attack Son 25       Died with an MI  . Cancer Brother        liver  . Heart attack Brother 80  . Aneurysm Brother       Labs/Other Tests and Data Reviewed:    Wt Readings from Last 3 Encounters:  03/18/18 136 lb (61.7 kg)  10/13/17 139 lb (63 kg)  09/29/17 140 lb (63.5 kg)   Temp Readings from Last 3 Encounters:  03/18/18 (!) 96.4 F (35.8 C) (Oral)  10/13/17 97.9 F (36.6 C) (Oral)  09/29/17 (!) 96.9 F (36.1 C) (Oral)   BP Readings from Last 3 Encounters:  03/18/18 (!) 142/64  10/13/17 137/72  09/29/17 (!) 147/64   Pulse Readings from Last 3 Encounters:  03/18/18 61  10/13/17 79  09/29/17 (!) 51     No results found for: HGBA1C Lab Results  Component Value Date   LDLCALC 119 (H) 03/18/2018   CREATININE 1.22 03/18/2018       Chemistry      Component Value Date/Time   NA 138 03/18/2018 0836   K 4.5 03/18/2018 0836   CL 99 03/18/2018 0836   CO2 23 03/18/2018 0836   BUN 14 03/18/2018 0836   CREATININE 1.22 03/18/2018 0836   CREATININE 1.02 08/04/2012 1151      Component Value Date/Time   CALCIUM 9.7 03/18/2018 0836   ALKPHOS 56 03/18/2018 0836   AST 19 03/18/2018 0836   ALT 11 03/18/2018 0836   BILITOT 0.3 03/18/2018 0836         OBSERVATIONS/ OBJECTIVE:     The patient was alert considering he is 85 years old.  He says his weight is 135 pounds and he has no edema.  His most recent blood pressure was 138/70.  He denies having any fever.  Physical exam deferred due to nature of telephonic visit.  ASSESSMENT & PLAN    Time:    Today, I have spent 28 minutes with the patient via telephone discussing the above including Covid precautions.     Visit Diagnoses: 1. Hyperlipidemia LDL goal <100 -The patient is statin intolerant.  He has not been able to tolerate any other statins in the past.  2. Thoracic aorta atherosclerosis (HCC) -He will have to continue with aggressive therapeutic lifestyle changes  3. Primary osteoarthritis involving multiple joints -Continue with Tylenol for pain  4. Benign prostatic hyperplasia without lower urinary tract symptoms -Continue with terazosin for blood pressure and prostate  5. Gastroesophageal reflux disease, esophagitis presence not specified -If he needs anything for reflux he should take Pepcid AC  6. Vitamin D deficiency -Continue with vitamin D replacement  7. COPD (chronic obstructive pulmonary disease) with chronic bronchitis (HCC) -Continue follow-up with Dr. Juanetta Gosling.  He needs his albuterol refilled and he now says he is taking Spiriva though the record says he has been taking Respimat.  We will try to confirm that with the drugstore. Patient Instructions  Continue to practice good hand and respiratory hygiene Continue to drink plenty of water Continue to use inhalers regularly Continue to use Mucinex for cough and congestion Continue to follow-up with the cardiologist as planned and the pulmonologist, Dr. Juanetta Gosling as planned Stay active as physically possible and always be careful to not put self at risk for falling      The above assessment and management plan was discussed with the patient. The  patient verbalized understanding of and has agreed to the management plan. Patient is aware to call the clinic if symptoms persist or worsen. Patient is aware when to return to the clinic for a follow-up visit. Patient educated on when it is appropriate to go to the emergency department.    Ernestina Penna, MD [facility] Medicine [address] [employer],  [place], [place] [address] Ph [phone]    Nyra Capes MD

## 2018-07-26 ENCOUNTER — Other Ambulatory Visit: Payer: Self-pay | Admitting: Pharmacy Technician

## 2018-07-26 NOTE — Patient Outreach (Signed)
[facility]) Care Management  07/26/2018  Carlos Brooks 05/03/1926 [id number]   Successful call placed to patient and spouse regarding patient assistance receipt of attestation form from [place] for Zetia and Proventil HFA, HIPAA identifiers verified. Assisted Mr. And Carlos Brooks with filling out attestation forms for [name] and Carlos Brooks Zetia.  Carlos Brooks states that he has not received his Stiolto from [employer] patient assistance.  Called [employer] to follow up on shipping details for Stiolto and was informed by [facility] that medication appears to have been lost in shipment. Expedited shipment has been requested for medication to be remailed out.  Informed patient.  Follow up:  Will follow up with [employer] in 7-10 business days to check status of applications.  Maud Deed [name] [place] Certified Pharmacy Technician [place] Management Direct Dial:[phone]

## 2018-08-08 ENCOUNTER — Other Ambulatory Visit: Payer: Self-pay | Admitting: Pharmacy Technician

## 2018-08-08 NOTE — Patient Outreach (Signed)
[facility]) Care Management  08/08/2018  HOSEY BURMESTER Jan 06, 1927 [id number]    Follow up call placed to [employer] regarding patient assistance application(s) for Proventil HFA , [employer] Confirms patient has been approved as of 5/27 until 03/09/19. Medication arrive at patient home in 10-14 business days.  Follow up:  Will follow up with patient in 2-3 business days to inform.  Maud Deed [name] [place] Certified Pharmacy Technician [place] Management Direct Dial:[phone]

## 2018-08-09 ENCOUNTER — Telehealth: Payer: Self-pay | Admitting: Family Medicine

## 2018-08-09 ENCOUNTER — Other Ambulatory Visit: Payer: PPO

## 2018-08-09 ENCOUNTER — Other Ambulatory Visit: Payer: Self-pay

## 2018-08-09 DIAGNOSIS — E785 Hyperlipidemia, unspecified: Secondary | ICD-10-CM | POA: Diagnosis not present

## 2018-08-09 DIAGNOSIS — N400 Enlarged prostate without lower urinary tract symptoms: Secondary | ICD-10-CM

## 2018-08-09 DIAGNOSIS — K219 Gastro-esophageal reflux disease without esophagitis: Secondary | ICD-10-CM | POA: Diagnosis not present

## 2018-08-09 DIAGNOSIS — E559 Vitamin D deficiency, unspecified: Secondary | ICD-10-CM

## 2018-08-09 DIAGNOSIS — I700 Atherosclerosis of aorta: Secondary | ICD-10-CM

## 2018-08-09 DIAGNOSIS — J4489 Other specified chronic obstructive pulmonary disease: Secondary | ICD-10-CM

## 2018-08-09 DIAGNOSIS — J449 Chronic obstructive pulmonary disease, unspecified: Secondary | ICD-10-CM

## 2018-08-09 DIAGNOSIS — M150 Primary generalized (osteo)arthritis: Secondary | ICD-10-CM | POA: Diagnosis not present

## 2018-08-09 DIAGNOSIS — M159 Polyosteoarthritis, unspecified: Secondary | ICD-10-CM

## 2018-08-09 NOTE — Telephone Encounter (Signed)
Pt called about appt  

## 2018-08-10 LAB — CBC WITH DIFFERENTIAL/PLATELET
Basophils Absolute: 0.1 10*3/uL (ref 0.0–0.2)
Basos: 1 %
EOS (ABSOLUTE): 1.2 10*3/uL — ABNORMAL HIGH (ref 0.0–0.4)
Eos: 16 %
Hematocrit: 41.4 % (ref 37.5–51.0)
Hemoglobin: 14 g/dL (ref 13.0–17.7)
Immature Grans (Abs): 0 10*3/uL (ref 0.0–0.1)
Immature Granulocytes: 1 %
Lymphocytes Absolute: 2.2 10*3/uL (ref 0.7–3.1)
Lymphs: 30 %
MCH: 31 pg (ref 26.6–33.0)
MCHC: 33.8 g/dL (ref 31.5–35.7)
MCV: 92 fL (ref 79–97)
Monocytes Absolute: 0.7 10*3/uL (ref 0.1–0.9)
Monocytes: 9 %
Neutrophils Absolute: 3.3 10*3/uL (ref 1.4–7.0)
Neutrophils: 43 %
Platelets: 156 10*3/uL (ref 150–450)
RBC: 4.51 x10E6/uL (ref 4.14–5.80)
RDW: 13.4 % (ref 11.6–15.4)
WBC: 7.5 10*3/uL (ref 3.4–10.8)

## 2018-08-10 LAB — VITAMIN D 25 HYDROXY (VIT D DEFICIENCY, FRACTURES): Vit D, 25-Hydroxy: 72.8 ng/mL (ref 30.0–100.0)

## 2018-08-10 LAB — BMP8+EGFR
BUN/Creatinine Ratio: 19 (ref 10–24)
BUN: 20 mg/dL (ref 10–36)
CO2: 25 mmol/L (ref 20–29)
Calcium: 9.8 mg/dL (ref 8.6–10.2)
Chloride: 105 mmol/L (ref 96–106)
Creatinine, Ser: 1.07 mg/dL (ref 0.76–1.27)
GFR calc Af Amer: 70 mL/min/{1.73_m2} (ref >59)
GFR calc non Af Amer: 61 mL/min/{1.73_m2} (ref >59)
Glucose: 93 mg/dL (ref 65–99)
Potassium: 4.3 mmol/L (ref 3.5–5.2)
Sodium: 144 mmol/L (ref 134–144)

## 2018-08-10 LAB — LIPID PANEL
Chol/HDL Ratio: 3.8 ratio (ref 0.0–5.0)
Cholesterol, Total: 200 mg/dL — ABNORMAL HIGH (ref 100–199)
HDL: 52 mg/dL (ref >39)
LDL Calculated: 117 mg/dL — ABNORMAL HIGH (ref 0–99)
Triglycerides: 153 mg/dL — ABNORMAL HIGH (ref 0–149)
VLDL Cholesterol Cal: 31 mg/dL (ref 5–40)

## 2018-08-10 LAB — HEPATIC FUNCTION PANEL
ALT: 13 IU/L (ref 0–44)
AST: 18 IU/L (ref 0–40)
Albumin: 4.9 g/dL — ABNORMAL HIGH (ref 3.5–4.6)
Alkaline Phosphatase: 52 IU/L (ref 39–117)
Bilirubin Total: 0.4 mg/dL (ref 0.0–1.2)
Bilirubin, Direct: 0.11 mg/dL (ref 0.00–0.40)
Total Protein: 6.9 g/dL (ref 6.0–8.5)

## 2018-08-17 DIAGNOSIS — J301 Allergic rhinitis due to pollen: Secondary | ICD-10-CM | POA: Diagnosis not present

## 2018-08-17 DIAGNOSIS — N401 Enlarged prostate with lower urinary tract symptoms: Secondary | ICD-10-CM | POA: Diagnosis not present

## 2018-08-17 DIAGNOSIS — J411 Mucopurulent chronic bronchitis: Secondary | ICD-10-CM | POA: Diagnosis not present

## 2018-08-17 DIAGNOSIS — M542 Cervicalgia: Secondary | ICD-10-CM | POA: Diagnosis not present

## 2018-09-05 ENCOUNTER — Telehealth: Payer: Self-pay | Admitting: Family Medicine

## 2018-09-06 ENCOUNTER — Other Ambulatory Visit: Payer: Self-pay | Admitting: Pharmacy Technician

## 2018-09-06 NOTE — Telephone Encounter (Signed)
Pt appt made

## 2018-09-06 NOTE — Patient Outreach (Addendum)
[facility]) Care Management  09/06/2018  JAYSTEN ESSNER [birth year] [id number]    Successful call placed to patient regarding patient assistance medication receipt from [place], HIPAA identifiers verified. Mr. Jabs confirms that he received the Proventil HFA. While talking to patient he started discussing his Stiolto inhaler. Mr. Mitch came across as confused about how often to take medication. Informed Mr. Bhakta that I would have pharmacist contact him about his inhaler usage.  **Mr. Jaquith also states that his wife has not received the Zetia from [employer] patient assistance  Follow up:  Will route note to [place] for consultation and case closure.  Will follow up with [facility] to check on shipment details for Mrs. [place].  Maud Deed [name] [place] Certified Pharmacy Technician [place] Management Direct Dial:[phone]

## 2018-09-07 ENCOUNTER — Other Ambulatory Visit: Payer: Self-pay | Admitting: Pharmacist

## 2018-09-07 ENCOUNTER — Other Ambulatory Visit: Payer: Self-pay | Admitting: Family Medicine

## 2018-09-07 ENCOUNTER — Ambulatory Visit: Payer: Self-pay | Admitting: Pharmacist

## 2018-09-07 NOTE — Telephone Encounter (Signed)
Please advise 

## 2018-09-12 NOTE — Patient Outreach (Signed)
[facility]) Care Management [place]  09/12/2018  Carlos Brooks [birth year] [id number]  Reason for referral: medication assistance  [facility] pharmacy case is being closed due to the following reasons:  Goals have been met.  Spoke with [name] with HIPAA identifiers verified x2.  Patient states he is taking 2 puffs daily of Stiolto Respimat inhaler.  He uses his Proventil HFA as needed.  Reviewed medications and instructions.  Patient has been provided [facility] CM contact information if assistance needed in the future.    Thank you for allowing [facility] pharmacy to be involved in this patient's care.      Regina Eck, PharmD, [place]  [phone]

## 2018-10-14 ENCOUNTER — Other Ambulatory Visit: Payer: Self-pay

## 2018-10-17 ENCOUNTER — Ambulatory Visit (INDEPENDENT_AMBULATORY_CARE_PROVIDER_SITE_OTHER): Payer: PPO | Admitting: Family Medicine

## 2018-10-17 ENCOUNTER — Other Ambulatory Visit: Payer: Self-pay

## 2018-10-17 ENCOUNTER — Encounter: Payer: Self-pay | Admitting: Family Medicine

## 2018-10-17 VITALS — BP 147/77 | HR 58 | Temp 97.7°F | Ht 67.0 in | Wt 140.0 lb

## 2018-10-17 DIAGNOSIS — Z79899 Other long term (current) drug therapy: Secondary | ICD-10-CM

## 2018-10-17 DIAGNOSIS — I700 Atherosclerosis of aorta: Secondary | ICD-10-CM | POA: Diagnosis not present

## 2018-10-17 DIAGNOSIS — J449 Chronic obstructive pulmonary disease, unspecified: Secondary | ICD-10-CM | POA: Diagnosis not present

## 2018-10-17 DIAGNOSIS — F5101 Primary insomnia: Secondary | ICD-10-CM | POA: Diagnosis not present

## 2018-10-17 DIAGNOSIS — E785 Hyperlipidemia, unspecified: Secondary | ICD-10-CM | POA: Diagnosis not present

## 2018-10-17 DIAGNOSIS — K219 Gastro-esophageal reflux disease without esophagitis: Secondary | ICD-10-CM | POA: Diagnosis not present

## 2018-10-17 DIAGNOSIS — N400 Enlarged prostate without lower urinary tract symptoms: Secondary | ICD-10-CM

## 2018-10-17 MED ORDER — LORAZEPAM 0.5 MG PO TABS: ORAL_TABLET | ORAL | 5 refills | Status: DC

## 2018-10-17 MED ORDER — FINASTERIDE 5 MG PO TABS: 5.0000 mg | ORAL_TABLET | Freq: Every day | ORAL | 1 refills | Status: DC

## 2018-10-17 MED ORDER — TERAZOSIN HCL 5 MG PO CAPS: ORAL_CAPSULE | ORAL | 0 refills | Status: DC

## 2018-10-17 MED ORDER — PANTOPRAZOLE SODIUM 40 MG PO TBEC: 40.0000 mg | DELAYED_RELEASE_TABLET | Freq: Every day | ORAL | 3 refills | Status: DC

## 2018-10-17 NOTE — Progress Notes (Signed)
Subjective:  Patient ID: Carlos Brooks, male    DOB: Apr 19, 1926, 85 y.o.   MRN: [id number]  Patient Care Team: [name], FNP as PCP - General (Family Medicine) [name], MD as Consulting Physician (Optometry) Hayden Pedro, MD as Consulting Physician (Ophthalmology) Jarome Matin, MD as Consulting Physician (Dermatology) Minus Breeding, MD as Consulting Physician (Cardiology)   Chief Complaint:  Medical Management of Chronic Issues (3-4 mo ) and Hyperlipidemia   HPI: Carlos Brooks is a 85 y.o. male presenting on 10/17/2018 for Medical Management of Chronic Issues (3-4 mo ) and Hyperlipidemia    1. Hyperlipidemia LDL goal <100  Diet controlled. Does stay active. Does try to watch his diet.    2. Thoracic aorta atherosclerosis ([place])  On daily ASA. No chest pain, abdominal pain, fatigue, weakness, dizziness, or syncope.    3. Gastroesophageal reflux disease without esophagitis  Pt reports increasing symptoms. States he has epigastric burning after certain meals and if he reclines after eating. No hemoptysis, dysphagia, voice change, sore throat, weight loss, melena, or hematochezia. He has not tried anything for the symptoms.    4. Benign prostatic hyperplasia without lower urinary tract symptoms  Doing well on current medications. No dysuria, retention, fever, chills, flank pain, or hematuria.    5. COPD (chronic obstructive pulmonary disease) with chronic bronchitis ([place])  Doing very well on current medications. Does have exertional shortness of breath and cough that is relieved by rest and medications. No fever, increased sputum production, fatigue, or weakness.    6. Primary insomnia  Ongoing for several years. Has been taking Ativan 0.5 mg at night as needed for sleep. Has not tried other remedies.      Relevant past medical, surgical, family, and social history reviewed and updated as indicated.  Allergies and medications reviewed and updated.  Date reviewed: Chart in Epic.   Past Medical History:  Diagnosis Date  . BPH (benign prostatic hyperplasia)   . Cancer ([place])    skin  . Cataract   . COPD (chronic obstructive pulmonary disease) ([place])   . Generalized headaches   . GERD (gastroesophageal reflux disease)   . Hiatal hernia   . Hyperlipidemia     Past Surgical History:  Procedure Laterality Date  . EYE SURGERY    . HERNIA REPAIR     x 3, inguinal     Social History   Socioeconomic History  . Marital status: Married    Spouse name: Not on file  . Number of children: 5  . Years of education: Not on file  . Highest education level: Not on file  Occupational History  . Not on file  Social Needs  . Financial resource strain: Not hard at all  . Food insecurity    Worry: Never true    Inability: Never true  . Transportation needs    Medical: No    Non-medical: No  Tobacco Use  . Smoking status: Former Smoker    Quit date: 04/13/1993    Years since quitting: 25.5  . Smokeless tobacco: Never Used  Substance and Sexual Activity  . Alcohol use: No  . Drug use: No  . Sexual activity: Never  Lifestyle  . Physical activity    Days per week: 0 days    Minutes per session: 0 min  . Stress: Not at all  Relationships  . Social connections    Talks on phone: More than three times a week  Gets together: More than three times a week    Attends religious service: More than 4 times per year    Active member of club or organization: Yes    Attends meetings of clubs or organizations: More than 4 times per year    Relationship status: Married  . Intimate partner violence    Fear of current or ex partner: Not on file    Emotionally abused: Not on file    Physically abused: Not on file    Forced sexual activity: Not on file  Other Topics Concern  . Not on file  Social History Narrative  . Not on file    Outpatient Encounter Medications as of 10/17/2018  Medication Sig  . albuterol (PROAIR HFA) 108 (90 Base)  MCG/ACT inhaler 2 PUFFS EVERY 6 HOURS AS NEEDED FOR WHEEZING OR SHORTNESS OF BREATH  . aspirin 81 MG EC tablet Take 81 mg by mouth daily.    . finasteride (PROSCAR) 5 MG tablet Take 1 tablet (5 mg total) by mouth daily.  Marland Kitchen terazosin (HYTRIN) 5 MG capsule TAKE (1) CAPSULE DAILY  . tiotropium (SPIRIVA HANDIHALER) 18 MCG inhalation capsule Place 1 capsule (18 mcg total) into inhaler and inhale daily.  . Tiotropium Bromide-Olodaterol (STIOLTO RESPIMAT) 2.5-2.5 MCG/ACT AERS Inhale 2 puffs into the lungs daily.  . Vitamin D, Ergocalciferol, (DRISDOL) 1.25 MG (50000 UT) CAPS capsule TAKE 1 CAPSULE ONCE A WEEK  . [DISCONTINUED] finasteride (PROSCAR) 5 MG tablet TAKE 1 TABLET DAILY  . [DISCONTINUED] terazosin (HYTRIN) 5 MG capsule TAKE (1) CAPSULE DAILY  . LORazepam (ATIVAN) 0.5 MG tablet Take nightly as needed for sleep  . pantoprazole (PROTONIX) 40 MG tablet Take 1 tablet (40 mg total) by mouth daily.  . [DISCONTINUED] LORazepam (ATIVAN) 0.5 MG tablet TAKE (1) TABLET DAILY AS NEEDED. (Patient not taking: Reported on 10/17/2018)   No facility-administered encounter medications on file as of 10/17/2018.     Allergies  Allergen Reactions  . Clarithromycin   . Crestor [Rosuvastatin Calcium]   . Lipitor [Atorvastatin Calcium]   . Niaspan [Niacin Er]   . Pneumovax [Pneumococcal Polysaccharide Vaccine]     Arm swelling and rash  . Pravachol   . Ranitidine Hcl   . Simvastatin     Review of Systems  Constitutional: Negative for activity change, appetite change, chills, diaphoresis, fatigue, fever and unexpected weight change.  HENT: Negative.   Eyes: Negative.  Negative for photophobia and visual disturbance.  Respiratory: Positive for cough and shortness of breath. Negative for chest tightness and wheezing.   Cardiovascular: Negative for chest pain, palpitations and leg swelling.  Gastrointestinal: Positive for abdominal pain (epigastric burning). Negative for abdominal distention, anal bleeding,  blood in stool, constipation, diarrhea, nausea, rectal pain and vomiting.  Endocrine: Negative.  Negative for polydipsia, polyphagia and polyuria.  Genitourinary: Negative for decreased urine volume, difficulty urinating, dysuria, enuresis, flank pain, frequency, hematuria and urgency.  Musculoskeletal: Negative for arthralgias and myalgias.  Skin: Negative.   Allergic/Immunologic: Negative.   Neurological: Negative for dizziness, tremors, seizures, syncope, facial asymmetry, speech difficulty, weakness, light-headedness, numbness and headaches.  Hematological: Negative.  Does not bruise/bleed easily.  Psychiatric/Behavioral: Positive for sleep disturbance. Negative for agitation, confusion, hallucinations and suicidal ideas. The patient is not nervous/anxious.   All other systems reviewed and are negative.       Objective:  BP (!) 147/77   Pulse (!) 58   Temp 97.7 F (36.5 C)   Ht 5' 7"  (1.702 m)   Wt 140  lb (63.5 kg)   BMI 21.93 kg/m    Wt Readings from Last 3 Encounters:  10/17/18 140 lb (63.5 kg)  03/18/18 136 lb (61.7 kg)  10/13/17 139 lb (63 kg)    Physical Exam Vitals signs and nursing note reviewed.  Constitutional:      General: He is not in acute distress.    Appearance: Normal appearance. He is well-developed and well-groomed. He is not ill-appearing, toxic-appearing or diaphoretic.  HENT:     Head: Normocephalic and atraumatic.     Jaw: There is normal jaw occlusion.     Right Ear: Hearing normal.     Left Ear: Hearing normal.     Nose: Nose normal.     Mouth/Throat:     Lips: Pink.     Mouth: Mucous membranes are moist.     Pharynx: Oropharynx is clear. Uvula midline.  Eyes:     General: Lids are normal.     Extraocular Movements: Extraocular movements intact.     Conjunctiva/sclera: Conjunctivae normal.     Pupils: Pupils are equal, round, and reactive to light.  Neck:     Musculoskeletal: Normal range of motion and neck supple.     Thyroid: No  thyroid mass, thyromegaly or thyroid tenderness.     Vascular: No carotid bruit or JVD.     Trachea: Trachea and phonation normal.  Cardiovascular:     Rate and Rhythm: Normal rate and regular rhythm.     Chest Wall: PMI is not displaced.     Pulses: Normal pulses.     Heart sounds: Normal heart sounds. No murmur. No friction rub. No gallop.   Pulmonary:     Effort: Pulmonary effort is normal. No respiratory distress.     Breath sounds: Normal breath sounds. No wheezing.  Abdominal:     General: Bowel sounds are normal. There is no distension or abdominal bruit.     Palpations: Abdomen is soft. There is no hepatomegaly or splenomegaly.     Tenderness: There is no abdominal tenderness. There is no right CVA tenderness or left CVA tenderness.     Hernia: No hernia is present.  Musculoskeletal: Normal range of motion.     Right lower leg: No edema.     Left lower leg: No edema.  Lymphadenopathy:     Cervical: No cervical adenopathy.  Skin:    General: Skin is warm and dry.     Capillary Refill: Capillary refill takes less than 2 seconds.     Coloration: Skin is not cyanotic, jaundiced or pale.     Findings: No rash.  Neurological:     General: No focal deficit present.     Mental Status: He is alert and oriented to person, place, and time.     Cranial Nerves: Cranial nerves are intact.     Sensory: Sensation is intact.     Motor: Motor function is intact.     Coordination: Coordination is intact.     Gait: Gait is intact.     Deep Tendon Reflexes: Reflexes are normal and symmetric.  Psychiatric:        Attention and Perception: Attention and perception normal.        Mood and Affect: Mood and affect normal.        Speech: Speech normal.        Behavior: Behavior normal. Behavior is cooperative.        Thought Content: Thought content normal.  Cognition and Memory: Cognition and memory normal.        Judgment: Judgment normal.     Results for orders placed or performed  in visit on 08/09/18  Hepatic function panel  Result Value Ref Range   Total Protein 6.9 6.0 - 8.5 g/dL   Albumin 4.9 (H) 3.5 - 4.6 g/dL   Bilirubin Total 0.4 0.0 - 1.2 mg/dL   Bilirubin, Direct 0.11 0.00 - 0.40 mg/dL   Alkaline Phosphatase 52 39 - 117 IU/L   AST 18 0 - 40 IU/L   ALT 13 0 - 44 IU/L  VITAMIN D 25 Hydroxy (Vit-D Deficiency, Fractures)  Result Value Ref Range   Vit D, 25-Hydroxy 72.8 30.0 - 100.0 ng/mL  Lipid panel  Result Value Ref Range   Cholesterol, Total 200 (H) 100 - 199 mg/dL   Triglycerides 153 (H) 0 - 149 mg/dL   HDL 52 >39 mg/dL   VLDL Cholesterol Cal 31 5 - 40 mg/dL   LDL Calculated 117 (H) 0 - 99 mg/dL   Chol/HDL Ratio 3.8 0.0 - 5.0 ratio  BMP8+EGFR  Result Value Ref Range   Glucose 93 65 - 99 mg/dL   BUN 20 10 - 36 mg/dL   Creatinine, Ser 1.07 0.76 - 1.27 mg/dL   GFR calc non Af Amer 61 >59 mL/min/1.73   GFR calc Af Amer 70 >59 mL/min/1.73   BUN/Creatinine Ratio 19 10 - 24   Sodium 144 134 - 144 mmol/L   Potassium 4.3 3.5 - 5.2 mmol/L   Chloride 105 96 - 106 mmol/L   CO2 25 20 - 29 mmol/L   Calcium 9.8 8.6 - 10.2 mg/dL  CBC with Differential/Platelet  Result Value Ref Range   WBC 7.5 3.4 - 10.8 x10E3/uL   RBC 4.51 4.14 - 5.80 x10E6/uL   Hemoglobin 14.0 13.0 - 17.7 g/dL   Hematocrit 41.4 37.5 - 51.0 %   MCV 92 79 - 97 fL   MCH 31.0 26.6 - 33.0 pg   MCHC 33.8 31.5 - 35.7 g/dL   RDW 13.4 11.6 - 15.4 %   Platelets 156 150 - 450 x10E3/uL   Neutrophils 43 Not Estab. %   Lymphs 30 Not Estab. %   Monocytes 9 Not Estab. %   Eos 16 Not Estab. %   Basos 1 Not Estab. %   Neutrophils Absolute 3.3 1.4 - 7.0 x10E3/uL   Lymphocytes Absolute 2.2 0.7 - 3.1 x10E3/uL   Monocytes Absolute 0.7 0.1 - 0.9 x10E3/uL   EOS (ABSOLUTE) 1.2 (H) 0.0 - 0.4 x10E3/uL   Basophils Absolute 0.1 0.0 - 0.2 x10E3/uL   Immature Granulocytes 1 Not Estab. %   Immature Grans (Abs) 0.0 0.0 - 0.1 x10E3/uL       Pertinent labs & imaging results that were available during my  care of the patient were reviewed by me and considered in my medical decision making.  Assessment & Plan:  Django was seen today for medical management of chronic issues and hyperlipidemia.  Diagnoses and all orders for this visit:  Hyperlipidemia LDL goal <100 Diet controlled. Diet and exercise encouraged. Last TC 200, LDL 117, Triglycerides 153. Will recheck in 6-9 months.   Thoracic aorta atherosclerosis ([place]) On daily ASA therapy. No chest pain, abdominal pain, dizziness, palpitations, or syncope.   Gastroesophageal reflux disease without esophagitis Increased symptoms over last several weeks. Will start PPI. Pt aware to report any new or worsening symptoms. Follow up in 4 months for reevaluation.  -  pantoprazole (PROTONIX) 40 MG tablet; Take 1 tablet (40 mg total) by mouth daily.  Benign prostatic hyperplasia without lower urinary tract symptoms Well controlled on below. Will continue.  -     finasteride (PROSCAR) 5 MG tablet; Take 1 tablet (5 mg total) by mouth daily. -     terazosin (HYTRIN) 5 MG capsule; TAKE (1) CAPSULE DAILY  COPD (chronic obstructive pulmonary disease) with chronic bronchitis ([facility]) Well controlled on current medications. Does have exertional shortness of breath and cough, no more than normal.   Primary insomnia Controlled substance agreement signed Will trial melatonin over the counter to see if beneficial. If this is not beneficial, will continue Ativan 0.49m nightly as needed. Pt aware to only use as needed. Sleep hygiene discussed.  -     LORazepam (ATIVAN) 0.5 MG tablet; Take nightly as needed for sleep -     ToxASSURE Select 13 (MW), Urine     Continue all other maintenance medications.  Follow up plan: Return in about 4 months (around 02/16/2019), or if symptoms worsen or fail to improve.  Continue healthy lifestyle choices, including diet (rich in fruits, vegetables, and lean proteins, and low in salt and simple carbohydrates) and exercise  (at least 30 minutes of moderate physical activity daily).  Educational handout given for survey, COVID-19  The above assessment and management plan was discussed with the patient. The patient verbalized understanding of and has agreed to the management plan. Patient is aware to call the clinic if symptoms persist or worsen. Patient is aware when to return to the clinic for a follow-up visit. Patient educated on when it is appropriate to go to the emergency department.   MMonia Pouch FNP-C [facility] [phone]/10/20

## 2018-10-17 NOTE — Patient Instructions (Addendum)
Melatonin 6-10 mg per night for sleep.   It was a pleasure seeing you today, Carlos Brooks.  Information regarding what we discussed is included in this packet.  Please make an appointment to see me in 6 months.   In a few days you may receive a survey in the mail or online from [employer] regarding your visit with [place] today. Please take a moment to fill this out. Your feedback is very important to our office. It can help [place] better understand your needs as well as improve your experience and satisfaction. Thank you for taking your time to complete it. We care about you.  Because of recent events of COVID-19 ("Coronavirus"), please follow CDC recommendations:   1. Wash your hand frequently 2. Avoid touching your face 3. Stay away from people who are sick 4. If you have symptoms such as fever, cough, shortness of breath then call your healthcare provider for further guidance 5. If you are sick, STAY AT HOME, unless otherwise directed by your healthcare provider. 6. Follow directions from state and national officials regarding staying safe    Please feel free to call our office if any questions or concerns arise.  Warm Regards, Monia Pouch, FNP-C [facility] [address] [place], [place] [address] [phone]

## 2018-10-19 LAB — TOXASSURE SELECT 13 (MW), URINE

## 2018-10-27 DIAGNOSIS — L820 Inflamed seborrheic keratosis: Secondary | ICD-10-CM | POA: Diagnosis not present

## 2018-10-27 DIAGNOSIS — D1801 Hemangioma of skin and subcutaneous tissue: Secondary | ICD-10-CM | POA: Diagnosis not present

## 2018-10-27 DIAGNOSIS — M7130 Other bursal cyst, unspecified site: Secondary | ICD-10-CM | POA: Diagnosis not present

## 2018-10-27 DIAGNOSIS — D044 Carcinoma in situ of skin of scalp and neck: Secondary | ICD-10-CM | POA: Diagnosis not present

## 2018-10-27 DIAGNOSIS — D485 Neoplasm of uncertain behavior of skin: Secondary | ICD-10-CM | POA: Diagnosis not present

## 2018-10-27 DIAGNOSIS — L570 Actinic keratosis: Secondary | ICD-10-CM | POA: Diagnosis not present

## 2018-10-27 DIAGNOSIS — L821 Other seborrheic keratosis: Secondary | ICD-10-CM | POA: Diagnosis not present

## 2018-10-27 DIAGNOSIS — Z85828 Personal history of other malignant neoplasm of skin: Secondary | ICD-10-CM | POA: Diagnosis not present

## 2018-11-03 ENCOUNTER — Telehealth: Payer: Self-pay | Admitting: *Deleted

## 2018-11-03 MED ORDER — PROAIR HFA 108 (90 BASE) MCG/ACT IN AERS: INHALATION_SPRAY | RESPIRATORY_TRACT | 11 refills | Status: DC

## 2018-11-03 NOTE — Telephone Encounter (Signed)
Fax from [facility] for brand albuterol inhaler 18 gm quantity Note states patient does better with brand ventolin HFA Please advise

## 2018-11-03 NOTE — Telephone Encounter (Signed)
Ok to send for brand only

## 2018-11-03 NOTE — Telephone Encounter (Signed)
Resent rx to pharmacy for brand name

## 2018-11-03 NOTE — Addendum Note (Signed)
Addended by: Rolena Infante on: 11/03/2018 11:37 AM   Modules accepted: Orders

## 2018-11-21 DIAGNOSIS — J411 Mucopurulent chronic bronchitis: Secondary | ICD-10-CM | POA: Diagnosis not present

## 2018-11-21 DIAGNOSIS — K210 Gastro-esophageal reflux disease with esophagitis: Secondary | ICD-10-CM | POA: Diagnosis not present

## 2018-11-21 DIAGNOSIS — J301 Allergic rhinitis due to pollen: Secondary | ICD-10-CM | POA: Diagnosis not present

## 2018-11-21 DIAGNOSIS — N401 Enlarged prostate with lower urinary tract symptoms: Secondary | ICD-10-CM | POA: Diagnosis not present

## 2018-11-24 ENCOUNTER — Ambulatory Visit: Payer: PPO | Admitting: Family Medicine

## 2018-11-25 ENCOUNTER — Other Ambulatory Visit: Payer: Self-pay

## 2018-11-25 ENCOUNTER — Ambulatory Visit (INDEPENDENT_AMBULATORY_CARE_PROVIDER_SITE_OTHER): Payer: PPO

## 2018-11-25 DIAGNOSIS — Z23 Encounter for immunization: Secondary | ICD-10-CM

## 2018-11-29 DIAGNOSIS — E785 Hyperlipidemia, unspecified: Secondary | ICD-10-CM | POA: Insufficient documentation

## 2018-11-29 NOTE — Progress Notes (Signed)
Cardiology Office Note   Date:  11/30/2018   ID:  Carlos Brooks, DOB 10-19-1926, MRN [id number]  PCP:  [name], FNP  Cardiologist:   Minus Breeding, MD   Chief Complaint  Patient presents with  . Shortness of Breath      History of Present Illness: Carlos Brooks is a 85 y.o. male who presents for evaluation of dyspnea. He has previously seen Dr. Mare Ferrari .  I saw once two slightly over one year ago.  Since then he has done relatively well.  He does have shortness of breath with activities but this seems to be unchanged from previous.  His wife reports that he snores at night and she thinks he looks like he stops breathing at times.  He denies any new cardiovascular symptoms such as chest pressure, neck or arm discomfort.  He has had no new palpitations, presyncope or syncope.  He has had no weight gain or edema.  He still does work around his house including climbing up on a ladder to fix the ceiling.  Past Medical History:  Diagnosis Date  . BPH (benign prostatic hyperplasia)   . Cancer ([place])    skin  . Cataract   . COPD (chronic obstructive pulmonary disease) ([place])   . Generalized headaches   . GERD (gastroesophageal reflux disease)   . Hiatal hernia   . Hyperlipidemia     Past Surgical History:  Procedure Laterality Date  . EYE SURGERY    . HERNIA REPAIR     x 3, inguinal      Current Outpatient Medications  Medication Sig Dispense Refill  . aspirin 81 MG EC tablet Take 81 mg by mouth daily.      . finasteride (PROSCAR) 5 MG tablet Take 1 tablet (5 mg total) by mouth daily. 90 tablet 1  . LORazepam (ATIVAN) 0.5 MG tablet Take nightly as needed for sleep 30 tablet 5  . pantoprazole (PROTONIX) 40 MG tablet Take 1 tablet (40 mg total) by mouth daily. 30 tablet 3  . PROAIR HFA 108 (90 Base) MCG/ACT inhaler 2 PUFFS EVERY 6 HOURS AS NEEDED FOR WHEEZING OR SHORTNESS OF BREATH 8.5 g 11  . terazosin (HYTRIN) 5 MG capsule TAKE (1) CAPSULE DAILY 90 capsule 0   . tiotropium (SPIRIVA HANDIHALER) 18 MCG inhalation capsule Place 1 capsule (18 mcg total) into inhaler and inhale daily. 30 capsule 11  . Tiotropium Bromide-Olodaterol (STIOLTO RESPIMAT) 2.5-2.5 MCG/ACT AERS Inhale 2 puffs into the lungs daily.    . Vitamin D, Ergocalciferol, (DRISDOL) 1.25 MG (50000 UT) CAPS capsule TAKE 1 CAPSULE ONCE A WEEK 12 capsule 1   No current facility-administered medications for this visit.     Allergies:   Clarithromycin, Crestor [rosuvastatin calcium], Lipitor [atorvastatin calcium], Niaspan [niacin er], Pneumovax [pneumococcal polysaccharide vaccine], Pravachol, Ranitidine hcl, and Simvastatin     ROS:  Please see the history of present illness.   Otherwise, review of systems are positive for none.   All other systems are reviewed and negative.    PHYSICAL EXAM: VS:  BP 130/62   Pulse 62   Ht 5\' 6"  (1.676 m)   Wt 139 lb (63 kg)   BMI 22.44 kg/m  , BMI Body mass index is 22.44 kg/m.  GENERAL:  Well appearing NECK:  No jugular venous distention, waveform within normal limits, carotid upstroke brisk and symmetric, left bruits, no thyromegaly LUNGS:  Clear to auscultation bilaterally CHEST:  Mild wheezing HEART:  PMI not  displaced or sustained,S1 and S2 within normal limits, no S3, no S4, no clicks, no rubs, no murmurs ABD:  Flat, positive bowel sounds normal in frequency in pitch, no bruits, no rebound, no guarding, no midline pulsatile mass, no hepatomegaly, no splenomegaly EXT:  2 plus pulses throughout, no edema, no cyanosis no clubbing   EKG:  EKG is   ordered today. The ekg ordered today demonstrates sinus bradycardia, rate 62,  LAD,  new left bundle branch block compared to previous.   Recent Labs: 08/09/2018: ALT 13; BUN 20; Creatinine, Ser 1.07; Hemoglobin 14.0; Platelets 156; Potassium 4.3; Sodium 144    Lipid Panel    Component Value Date/Time   CHOL 200 (H) 08/09/2018 1313   CHOL 197 08/04/2012 1151   TRIG 153 (H) 08/09/2018 1313    TRIG 191 (H) 10/21/2015 1048   TRIG 162 (H) 08/04/2012 1151   HDL 52 08/09/2018 1313   HDL 46 10/21/2015 1048   HDL 45 08/04/2012 1151   CHOLHDL 3.8 08/09/2018 1313   LDLCALC 117 (H) 08/09/2018 1313   LDLCALC 154 (H) 12/05/2013 0920   LDLCALC 120 (H) 08/04/2012 1151      Wt Readings from Last 3 Encounters:  11/30/18 139 lb (63 kg)  10/17/18 140 lb (63.5 kg)  03/18/18 136 lb (61.7 kg)      Other studies Reviewed: Additional studies/ records that were reviewed today include: Labs Review of the above records demonstrates:   See elsewhere   ASSESSMENT AND PLAN:  DYSPNEA:   This seems to be chronic.  No change in therapy.  LBBB:   No change in therapy.   DYSLIPIDEMIA: :   LDL is mildly elevated.  And then continue the therapy as listed.  SLEEP APNEA: It is hard to know whether he is having this.  And then try to order a home sleep study.  BRUIT: I will check a carotid Doppler  Current medicines are reviewed at length with the patient today.  The patient does not have concerns regarding medicines.  The following changes have been made:  None  Labs/ tests ordered today include:    Orders Placed This Encounter  Procedures  . US Carotid Duplex Bilateral  . EKG 12-Lead  . Home sleep test     Disposition:   FU with me in 12 months.    Signed, Minus Breeding, MD  11/30/2018 12:18 PM    [facility]

## 2018-11-30 ENCOUNTER — Other Ambulatory Visit: Payer: Self-pay

## 2018-11-30 ENCOUNTER — Telehealth: Payer: Self-pay | Admitting: *Deleted

## 2018-11-30 ENCOUNTER — Ambulatory Visit (INDEPENDENT_AMBULATORY_CARE_PROVIDER_SITE_OTHER): Payer: PPO | Admitting: Cardiology

## 2018-11-30 ENCOUNTER — Encounter: Payer: Self-pay | Admitting: Cardiology

## 2018-11-30 VITALS — BP 130/62 | HR 62 | Ht 66.0 in | Wt 139.0 lb

## 2018-11-30 DIAGNOSIS — R0602 Shortness of breath: Secondary | ICD-10-CM | POA: Diagnosis not present

## 2018-11-30 DIAGNOSIS — F5101 Primary insomnia: Secondary | ICD-10-CM | POA: Diagnosis not present

## 2018-11-30 DIAGNOSIS — R0989 Other specified symptoms and signs involving the circulatory and respiratory systems: Secondary | ICD-10-CM | POA: Diagnosis not present

## 2018-11-30 DIAGNOSIS — I447 Left bundle-branch block, unspecified: Secondary | ICD-10-CM

## 2018-11-30 DIAGNOSIS — E785 Hyperlipidemia, unspecified: Secondary | ICD-10-CM | POA: Diagnosis not present

## 2018-11-30 NOTE — Telephone Encounter (Signed)
-----   Message from Shellia Cleverly, RN sent at 11/30/2018  4:29 PM EDT ----- Regarding: home sleep study Dr Percival Spanish ordered this pt to have a home sleep study.    Thank you,  Pam

## 2018-11-30 NOTE — Patient Instructions (Signed)
Medication Instructions:  The current medical regimen is effective;  continue present plan and medications.  If you need a refill on your cardiac medications before your next appointment, please call your pharmacy.   Testing/Procedures: Your physician has requested that you have a carotid duplex. This test is an ultrasound of the carotid arteries in your neck. It looks at blood flow through these arteries that supply the brain with blood. Allow one hour for this exam. There are no restrictions or special instructions.  This will be completed at [facility].  Report to the Main Entrance to check in.  Your physician has recommended that you have a sleep study. This test records several body functions during sleep, including: brain activity, eye movement, oxygen and carbon dioxide blood levels, heart rate and rhythm, breathing rate and rhythm, the flow of air through your mouth and nose, snoring, body muscle movements, and chest and belly movement.  You will be contacted and mailed information regarding this testing.  Follow-Up: Follow up in 1 year with Dr. Percival Spanish.  You will receive a letter in the mail 2 months before you are due.  Please call us when you receive this letter to schedule your follow up appointment.  Thank you for choosing [facility]!!

## 2018-12-01 ENCOUNTER — Telehealth: Payer: Self-pay | Admitting: *Deleted

## 2018-12-01 NOTE — Telephone Encounter (Signed)
Patient notified of [place] appointment.

## 2018-12-13 ENCOUNTER — Other Ambulatory Visit: Payer: Self-pay

## 2018-12-13 ENCOUNTER — Ambulatory Visit: Payer: PPO | Attending: Cardiology | Admitting: Cardiovascular Disease

## 2018-12-13 ENCOUNTER — Other Ambulatory Visit: Payer: Self-pay | Admitting: Family Medicine

## 2018-12-13 DIAGNOSIS — Z79899 Other long term (current) drug therapy: Secondary | ICD-10-CM | POA: Diagnosis not present

## 2018-12-13 DIAGNOSIS — R0602 Shortness of breath: Secondary | ICD-10-CM

## 2018-12-13 DIAGNOSIS — Z7982 Long term (current) use of aspirin: Secondary | ICD-10-CM | POA: Diagnosis not present

## 2018-12-13 DIAGNOSIS — R0902 Hypoxemia: Secondary | ICD-10-CM | POA: Diagnosis not present

## 2018-12-13 DIAGNOSIS — G4733 Obstructive sleep apnea (adult) (pediatric): Secondary | ICD-10-CM

## 2018-12-13 DIAGNOSIS — F5101 Primary insomnia: Secondary | ICD-10-CM | POA: Diagnosis not present

## 2018-12-15 ENCOUNTER — Ambulatory Visit (HOSPITAL_COMMUNITY)
Admission: RE | Admit: 2018-12-15 | Discharge: 2018-12-15 | Disposition: A | Discharge status: Home / Self Care | Payer: PPO | Source: Ambulatory Visit | Attending: Cardiology | Admitting: Cardiology

## 2018-12-15 ENCOUNTER — Other Ambulatory Visit: Payer: Self-pay

## 2018-12-15 DIAGNOSIS — R0989 Other specified symptoms and signs involving the circulatory and respiratory systems: Secondary | ICD-10-CM | POA: Diagnosis not present

## 2018-12-15 DIAGNOSIS — I6523 Occlusion and stenosis of bilateral carotid arteries: Secondary | ICD-10-CM | POA: Diagnosis not present

## 2018-12-19 ENCOUNTER — Telehealth: Payer: Self-pay

## 2018-12-19 NOTE — Telephone Encounter (Signed)
-----   Message from Minus Breeding, MD sent at 12/18/2018 10:55 AM EDT ----- 1. Less than 50% stenosis of the right ICA  2. Less than 50% stenosis of the left ICA . 3. Ulcerative plaques are noted within the left carotid bulb and proximal left ICA. He does not need any intervention.  We can repeat a Doppler in two years. However, given the plaque I would continue ASA.  Also, he has been intolerant of statins. I would like for him to be seen in the Lipid Clinic (Pharm D) to consider PCSK9 inhibitor.

## 2018-12-19 NOTE — Telephone Encounter (Signed)
Spoke to pt's regarding carotid results and Dr. Rosezella [place] advice with aspirin and the Lipid Clinic. Pt verbalized understanding. Will route to PCP.

## 2018-12-20 ENCOUNTER — Telehealth: Payer: Self-pay | Admitting: Cardiology

## 2018-12-20 NOTE — Telephone Encounter (Signed)
New Message:    Ptsaid he need to talk to Dr Hochrein's nurse. He said somebody called and asked him if he wanted Dr Percival Spanish to be his doctor.

## 2018-12-20 NOTE — Telephone Encounter (Signed)
Returned call to pt he was confused and was just wondering if Dr Percival Spanish was still going to be his doctor. Informed pt that he was he was just seeing someone else for cholesterol to try to get down his cholesterol. He states that he needs a follow up appt after for himself and his wife in [place].  Message sent to schedulers to change lipid appt and schedule [place] appt

## 2018-12-24 ENCOUNTER — Encounter: Payer: Self-pay | Admitting: Cardiovascular Disease

## 2018-12-24 NOTE — Procedures (Signed)
   [place]        Patient Name: Carlos Brooks, Carlos Brooks Date: 12/13/2018 Gender: Male D.O.B: 1926-08-12 Age (years): 51 Referring Provider: Minus Breeding Height (inches): 13 Interpreting Physician: Shelva Majestic MD, ABSM Weight (lbs): 139 RPSGT: Peak, Robert BMI: 22 MRN: [id number] Neck Size: N/A  CLINICAL INFORMATION Sleep Study Type: HST  Indication for sleep study: Insomnia  Epworth Sleepiness Score: NA  SLEEP STUDY TECHNIQUE A multi-channel overnight portable sleep study was performed. The channels recorded were: nasal airflow, thoracic respiratory movement, and oxygen saturation with a pulse oximetry. Snoring was also monitored.  MEDICATIONS     aspirin 81 MG EC tablet             finasteride (PROSCAR) 5 MG tablet         LORazepam (ATIVAN) 0.5 MG tablet         pantoprazole (PROTONIX) 40 MG tablet         PROAIR HFA 108 (90 Base) MCG/ACT inhaler         terazosin (HYTRIN) 5 MG capsule         tiotropium (SPIRIVA HANDIHALER) 18 MCG inhalation capsule         Tiotropium Bromide-Olodaterol (STIOLTO RESPIMAT) 2.5-2.5 MCG/ACT AERS         Vitamin D, Ergocalciferol, (DRISDOL) 1.25 MG (50000 UT) CAPS capsule      Patient self administered medications include: N/A.  SLEEP ARCHITECTURE Patient was studied for 360 minutes. The sleep efficiency was 75.0 % and the patient was supine for 100%. The arousal index was 0.0 per hour.  RESPIRATORY PARAMETERS The overall AHI was 26.0 per hour, with a central apnea index of 2.8 per hour.  The oxygen nadir was 69% during sleep.  CARDIAC DATA Mean heart rate during sleep was 56.3 bpm.  IMPRESSIONS - Moderate obstructive sleep apnea  (AHI 26.0/h). The severity of sleep apnea during REM sleep cannot be assessed on this home study. - No significant central sleep apnea occurred during this study (CAI = 2.8/h). - Severe oxygen desaturation to a nadir of 69%. Total duration < 88% was 86.0 minutes. - Patient snored 0.3%  during the sleep.  DIAGNOSIS - Obstructive Sleep Apnea (327.23 [G47.33 ICD-10]) - Nocturnal Hypoxemia (327.26 [G47.36 ICD-10])  RECOMMENDATIONS - In this patient with significant oxygen desaturation and at least moderate overall sleep apnea recommend an in-lab CPAP titration study. If unable to perform an in-lab study recommend AutoPAP with EPR of 3 at 7- 20 cm and overnight oximetry. - Effort should be made to optimize nasal and oropharyngeal patency. - Positional therapy avoiding supine position during sleep. - Avoid alcohol, sedatives and other CNS depressants that may worsen sleep apnea and disrupt normal sleep architecture. - Sleep hygiene should be reviewed to assess factors that may improve sleep quality. - Weight management and regular exercise should be initiated or continued. - Recommend a download after 30 days of CPAP and sleep clinic evaluation.   [Electronically signed] 12/24/2018 10:34 AM  Shelva Majestic MD, [facility], [place], American Board of Sleep Medicine   NPI: [id number] [place] PH: [phone]   FX: [phone] [place]

## 2018-12-28 ENCOUNTER — Other Ambulatory Visit: Payer: Self-pay | Admitting: Cardiovascular Disease

## 2018-12-28 DIAGNOSIS — G4736 Sleep related hypoventilation in conditions classified elsewhere: Secondary | ICD-10-CM

## 2018-12-28 DIAGNOSIS — G4733 Obstructive sleep apnea (adult) (pediatric): Secondary | ICD-10-CM

## 2018-12-28 DIAGNOSIS — IMO0002 Reserved for concepts with insufficient information to code with codable children: Secondary | ICD-10-CM

## 2018-12-29 ENCOUNTER — Other Ambulatory Visit (HOSPITAL_BASED_OUTPATIENT_CLINIC_OR_DEPARTMENT_OTHER): Payer: Self-pay

## 2018-12-29 ENCOUNTER — Telehealth: Payer: Self-pay | Admitting: *Deleted

## 2018-12-29 NOTE — Telephone Encounter (Signed)
Patient informed of HST results and recommendations. He has agreed to having in lab CPAP titration. Appointment details given to patient.

## 2019-01-04 ENCOUNTER — Telehealth: Payer: Self-pay | Admitting: Cardiology

## 2019-01-04 NOTE — Telephone Encounter (Signed)
Spoke with patient. He would like his COVID pre-screen done at [facility]  Routed to Laie to reschedule for patient as this is for sleep study

## 2019-01-04 NOTE — Telephone Encounter (Signed)
New message:    Patient would like for someone to call him back concerning his appt at [facility] on Friday. Please call patient.

## 2019-01-06 ENCOUNTER — Other Ambulatory Visit (HOSPITAL_COMMUNITY): Admission: RE | Admit: 2019-01-06 | Payer: PPO | Source: Ambulatory Visit

## 2019-01-06 ENCOUNTER — Other Ambulatory Visit: Payer: Self-pay

## 2019-01-06 ENCOUNTER — Other Ambulatory Visit (HOSPITAL_COMMUNITY)
Admission: RE | Admit: 2019-01-06 | Discharge: 2019-01-06 | Disposition: A | Discharge status: Home / Self Care | Payer: PPO | Source: Ambulatory Visit | Attending: Cardiovascular Disease | Admitting: Cardiovascular Disease

## 2019-01-06 DIAGNOSIS — Z20828 Contact with and (suspected) exposure to other viral communicable diseases: Secondary | ICD-10-CM | POA: Insufficient documentation

## 2019-01-06 DIAGNOSIS — Z01812 Encounter for preprocedural laboratory examination: Secondary | ICD-10-CM | POA: Insufficient documentation

## 2019-01-06 LAB — SARS CORONAVIRUS 2 (TAT 6-24 HRS): SARS Coronavirus 2: NEGATIVE

## 2019-01-08 ENCOUNTER — Other Ambulatory Visit: Payer: Self-pay

## 2019-01-08 ENCOUNTER — Ambulatory Visit: Payer: PPO | Attending: Cardiovascular Disease | Admitting: Cardiovascular Disease

## 2019-01-08 DIAGNOSIS — G4733 Obstructive sleep apnea (adult) (pediatric): Secondary | ICD-10-CM | POA: Diagnosis not present

## 2019-01-08 DIAGNOSIS — IMO0002 Reserved for concepts with insufficient information to code with codable children: Secondary | ICD-10-CM

## 2019-01-08 DIAGNOSIS — G4736 Sleep related hypoventilation in conditions classified elsewhere: Secondary | ICD-10-CM | POA: Diagnosis not present

## 2019-01-14 ENCOUNTER — Other Ambulatory Visit: Payer: Self-pay | Admitting: Family Medicine

## 2019-01-14 DIAGNOSIS — N400 Enlarged prostate without lower urinary tract symptoms: Secondary | ICD-10-CM

## 2019-01-15 ENCOUNTER — Encounter: Payer: Self-pay | Admitting: Cardiovascular Disease

## 2019-01-15 NOTE — Procedures (Signed)
[place]  Patient Name: Carlos Brooks, Carlos Brooks Date: 01/08/2019 Gender: Male D.O.B: 07-Feb-1927 Age (years): 43 Referring Provider: Shelva Majestic MD, ABSM Height (inches): 66 Interpreting Physician: Shelva Majestic MD, ABSM Weight (lbs): 139 RPSGT: Rosebud Poles BMI: 22 MRN: [id number] Neck Size: 15.00  CLINICAL INFORMATION The patient is referred for a CPAP titration to treat sleep apnea.  Date of HST: 12/13/2018: AHI 26.0/h; O2 nadir 69%.  SLEEP STUDY TECHNIQUE As per the AASM Manual for the Scoring of Sleep and Associated Events v2.3 (April 2016) with a hypopnea requiring 4% desaturations.  The channels recorded and monitored were frontal, central and occipital EEG, electrooculogram (EOG), submentalis EMG (chin), nasal and oral airflow, thoracic and abdominal wall motion, anterior tibialis EMG, snore microphone, electrocardiogram, and pulse oximetry. Continuous positive airway pressure (CPAP) was initiated at the beginning of the study and titrated to treat sleep-disordered breathing.  MEDICATIONS aspirin 81 MG EC tablet finasteride (PROSCAR) 5 MG tablet LORazepam (ATIVAN) 0.5 MG tablet pantoprazole (PROTONIX) 40 MG tablet PROAIR HFA 108 (90 Base) MCG/ACT inhaler terazosin (HYTRIN) 5 MG capsule tiotropium (SPIRIVA HANDIHALER) 18 MCG inhalation capsule Tiotropium Bromide-Olodaterol (STIOLTO RESPIMAT) 2.5-2.5 MCG/ACT AERS Vitamin D, Ergocalciferol, (DRISDOL) 1.25 MG (50000 UT) CAPS capsule  Medications self-administered by patient taken the night of the study : N/A  TECHNICIAN COMMENTS Comments added by technician: Patient was restless all through the night. Patient seemed to tolerate CPAP therapy fairly well. Patient talked in his/her sleep. CPAP therapy started at 4 cm of H2O and increased to 11 cm of H20. Suboptimal pressure obtained due to patient's inability to keep mouth closed, although a full face mask was used and therapy was done with patients'  dentures in his mouth Comments added by scorer: N/A  RESPIRATORY PARAMETERS Optimal PAP Pressure (cm):  AHI at Optimal Pressure (/hr): N/A Overall Minimal O2 (%): 83.0 Supine % at Optimal Pressure (%): N/A Minimal O2 at Optimal Pressure (%): 83.0   SLEEP ARCHITECTURE The study was initiated at 10:43:40 PM and ended at 5:12:36 AM.  Sleep onset time was 12.3 minutes and the sleep efficiency was 77.9%%. The total sleep time was 303.2 minutes.  The patient spent 6.6%% of the night in stage N1 sleep, 67.7%% in stage N2 sleep, 12.0%% in stage N3 and 13.7% in REM.Stage REM latency was 86.5 minutes  Wake after sleep onset was 73.5. Alpha intrusion was absent. Supine sleep was 90.60%.  CARDIAC DATA The 2 lead EKG demonstrated sinus rhythm. The mean heart rate was 50.3 beats per minute. Other EKG findings include: Atrial Fibrillation.  LEG MOVEMENT DATA The total Periodic Limb Movements of Sleep (PLMS) were 0. The PLMS index was 0.0. A PLMS index of <15 is considered normal in adults.  IMPRESSIONS - CPAP was initiated at 4 cm and was only titrated to 11 cm of water. AHI at 11 cm was 9.1/h with O2 nadir at 88%. An optimal PAP pressure could not be selected for this patient based on the available study data. - Central sleep apnea was not noted during this titration (CAI = 2.0/h). - Moderate oxygen desaturations to a nadir of 83.0% at 4 cm of water. - The patient snored with soft snoring volume during this titration study. - 2-lead EKG demonstrated: Sinus rhythm with rare PVCs - Clinically significant periodic limb movements were not noted during this study. Arousals associated with PLMs were rare.  DIAGNOSIS - Obstructive Sleep Apnea (327.23 [G47.33 ICD-10])  RECOMMENDATIONS - Recommend an initial trial of CPAP Auto with  EPR of 3 at 11 - 20 cm H2O. Due to oral breathing a FFM is necessary. - Effort should be made to optimize nasal and oropharyngeal patency. - Avoid alcohol, sedatives and  other CNS depressants that may worsen sleep apnea and disrupt normal sleep architecture. - Sleep hygiene should be reviewed to assess factors that may improve sleep quality. - Weight management and regular exercise should be initiated or continued. - Recommend a download in 30 days and sleep clinic evaluation after 4 weeks of therapy  [Electronically signed] 01/15/2019 05:14 PM  Shelva Majestic MD, [employer],  ABSM Diplomate, American Board of Sleep Medicine   NPI: [id number] [place] PH: [phone]   FX: [phone] [place]

## 2019-01-19 ENCOUNTER — Telehealth: Payer: Self-pay

## 2019-01-19 NOTE — Telephone Encounter (Signed)
LMom to push appt back 30 mins will await call back

## 2019-01-23 ENCOUNTER — Telehealth: Payer: Self-pay | Admitting: Cardiology

## 2019-01-23 NOTE — Telephone Encounter (Signed)
Will route to PharmD to advise. 

## 2019-01-23 NOTE — Telephone Encounter (Signed)
Patient needed to push Pharmacist appt on 02/07/19 at 11:00 to 11:30. I do not see 11:30 as a time slot anymore. Please advise if pt still needs to push appt back.

## 2019-01-23 NOTE — Telephone Encounter (Signed)
OV changed and confirmed with patient

## 2019-01-24 ENCOUNTER — Telehealth: Payer: Self-pay | Admitting: *Deleted

## 2019-01-24 NOTE — Telephone Encounter (Signed)
Patient informed sleep study completed and CPAP orders have been sent to [facility] in [place].

## 2019-02-07 ENCOUNTER — Ambulatory Visit: Payer: PPO

## 2019-02-07 ENCOUNTER — Other Ambulatory Visit: Payer: Self-pay

## 2019-02-07 ENCOUNTER — Ambulatory Visit (INDEPENDENT_AMBULATORY_CARE_PROVIDER_SITE_OTHER): Payer: PPO | Admitting: Pharmacist Clinician (PhC)/ Clinical Pharmacy Specialist

## 2019-02-07 ENCOUNTER — Ambulatory Visit: Payer: PPO | Admitting: Internal Medicine

## 2019-02-07 DIAGNOSIS — E785 Hyperlipidemia, unspecified: Secondary | ICD-10-CM | POA: Diagnosis not present

## 2019-02-07 NOTE — Progress Notes (Signed)
02/08/2019 Carlos Brooks Aug 06, 1926 [id number]   HPI:  Carlos Brooks is a 85 y.o. male patient of Dr Percival Spanish, who presents today for a lipid clinic evaluation.  See pertinent past medical history below.  He was seen by Dr. Percival Spanish several months ago.  At that time he was evaluated for OSA and was set up with CPAP.   He also had carotid dopplers done, which showed up to 50% blockage in both left and right carotids, with ulcerative plaques in the left carotid bulb and proximal L ICA.  Patient notes that he was previously on multiple statin drugs, but had to discontinue these due to myalgias     Past Medical History: OSA Started CPAP in the past 2 months  hypertension Well controlled, using terazosin for HTN/BPH  COPD Uses Stiolto daily and albuterol mdi prn  BPH On terazosin   Current Medications: none  Cholesterol Goals:  LDL < 70   Intolerant/previously tried: simvastatin, rosuvastatin, atorvastatin; - myalgias  Family history: dad died from cancer at 63, mother at 35 from cancer as well 12 siblings (3 living), at least 1 MI, most died from cancers 5 children, oldest son died at 64 from MI, 05-30-2022 living are overweight  Diet: home cooked meals; only salts tomatoes and eggs, doesn't eat restaurant food very often  Exercise:  Stays active at home, just replaced hardwood floors in house (wife notes he often tries to do more than he can)  Labs: 08/2018:  TC 200, TG 153, HDL 53, LDL 117   Current Outpatient Medications  Medication Sig Dispense Refill  . aspirin 81 MG EC tablet Take 81 mg by mouth daily.      . finasteride (PROSCAR) 5 MG tablet Take 1 tablet (5 mg total) by mouth daily. 90 tablet 1  . guaiFENesin (MUCINEX) 600 MG 12 hr tablet Take 1,200 mg by mouth 2 (two) times daily.    Marland Kitchen LORazepam (ATIVAN) 0.5 MG tablet Take nightly as needed for sleep 30 tablet 5  . pantoprazole (PROTONIX) 40 MG tablet Take 1 tablet (40 mg total) by mouth daily. 30 tablet 3  . PROAIR HFA 108  (90 Base) MCG/ACT inhaler 2 PUFFS EVERY 6 HOURS AS NEEDED FOR WHEEZING OR SHORTNESS OF BREATH 8.5 g 11  . terazosin (HYTRIN) 5 MG capsule TAKE (1) CAPSULE DAILY 90 capsule 0  . Tiotropium Bromide-Olodaterol (STIOLTO RESPIMAT) 2.5-2.5 MCG/ACT AERS Inhale 2 puffs into the lungs daily.    . Vitamin D, Ergocalciferol, (DRISDOL) 1.25 MG (50000 UT) CAPS capsule TAKE 1 CAPSULE ONCE A WEEK 12 capsule 0   No current facility-administered medications for this visit.     Allergies  Allergen Reactions  . Clarithromycin   . Crestor [Rosuvastatin Calcium]   . Lipitor [Atorvastatin Calcium]   . Niaspan [Niacin Er]   . Pneumovax [Pneumococcal Polysaccharide Vaccine]     Arm swelling and rash  . Pravachol   . Ranitidine Hcl   . Simvastatin     Past Medical History:  Diagnosis Date  . BPH (benign prostatic hyperplasia)   . Cancer ([place])    skin  . Cataract   . COPD (chronic obstructive pulmonary disease) ([place])   . Generalized headaches   . GERD (gastroesophageal reflux disease)   . Hiatal hernia   . Hyperlipidemia     There were no vitals taken for this visit.   Hyperlipidemia LDL goal <100 Patient with elevated LDL in setting of ASCVD.   Has been intolerant to multiple statin  drugs.  Reviewed options with patient and spouse.  Based on the information, they would like to start with Repatha 140 mg q14d.  Reviewed side effects, mechanism and expected drop in LDL cholesterol levels.  Will submit information to patient insurance for approval, then repeat labs after 5-6 doses.    Tommy Medal PharmD CPP [facility] [address] [place],  [address] [phone]

## 2019-02-07 NOTE — Patient Instructions (Addendum)
We will start the paperwork to get Repatha covered for you.    For assistance with pricing, please go to the Health Well foundation web site.   [email or web address]   Your disease state is "hypercholesterolemia"   The medication you will be using is called Repatha   Answer all the questions.   They will give you information to print a card when you're done.  You will need to take this card to the pharmacy.     Evolocumab injection What is this medicine? EVOLOCUMAB (e voe LOK ue mab) is known as a PCSK9 inhibitor. It is used to lower the level of cholesterol in the blood. It may be used alone or in combination with other cholesterol-lowering drugs. This drug may also be used to reduce the risk of heart attack, stroke, and certain types of heart surgery in patients with heart disease. This medicine may be used for other purposes; ask your health care provider or pharmacist if you have questions. COMMON BRAND NAME(S): Repatha What should I tell my health care provider before I take this medicine? They need to know if you have any of these conditions:  an unusual or allergic reaction to evolocumab, other medicines, foods, dyes, or preservatives  pregnant or trying to get pregnant  breast-feeding How should I use this medicine? This medicine is for injection under the skin. You will be taught how to prepare and give this medicine. Use exactly as directed. Take your medicine at regular intervals. Do not take your medicine more often than directed. It is important that you put your used needles and syringes in a special sharps container. Do not put them in a trash can. If you do not have a sharps container, call your pharmacist or health care provider to get one. Talk to your pediatrician regarding the use of this medicine in children. While this drug may be prescribed for children as young as 13 years for selected conditions, precautions do apply. Overdosage: If you think you have taken too  much of this medicine contact a poison control center or emergency room at once. NOTE: This medicine is only for you. Do not share this medicine with others. What if I miss a dose? If you miss a dose, take it as soon as you can if there are more than 7 days until the next scheduled dose, or skip the missed dose and take the next dose according to your original schedule. Do not take double or extra doses. What may interact with this medicine? Interactions are not expected. This list may not describe all possible interactions. Give your health care provider a list of all the medicines, herbs, non-prescription drugs, or dietary supplements you use. Also tell them if you smoke, drink alcohol, or use illegal drugs. Some items may interact with your medicine. What should I watch for while using this medicine? You may need blood work while you are taking this medicine. What side effects may I notice from receiving this medicine? Side effects that you should report to your doctor or health care professional as soon as possible:  allergic reactions like skin rash, itching or hives, swelling of the face, lips, or tongue  signs and symptoms of high blood sugar such as dizziness; dry mouth; dry skin; fruity breath; nausea; stomach pain; increased hunger or thirst; increased urination  signs and symptoms of infection like fever or chills; cough; sore throat; pain or trouble passing urine Side effects that usually do not require medical attention (report to  your doctor or health care professional if they continue or are bothersome):  diarrhea  nausea  muscle pain  pain, redness, or irritation at site where injected This list may not describe all possible side effects. Call your doctor for medical advice about side effects. You may report side effects to FDA at 1-800-FDA-1088. Where should I keep my medicine? Keep out of the reach of children. You will be instructed on how to store this medicine. Throw  away any unused medicine after the expiration date on the label. NOTE: This sheet is a summary. It may not cover all possible information. If you have questions about this medicine, talk to your doctor, pharmacist, or health care provider.  2020 Elsevier/Gold Standard (2016-10-05 13:31:00)

## 2019-02-08 ENCOUNTER — Encounter: Payer: Self-pay | Admitting: Pharmacist Clinician (PhC)/ Clinical Pharmacy Specialist

## 2019-02-08 NOTE — Assessment & Plan Note (Signed)
Patient with elevated LDL in setting of ASCVD.   Has been intolerant to multiple statin drugs.  Reviewed options with patient and spouse.  Based on the information, they would like to start with Repatha 140 mg q14d.  Reviewed side effects, mechanism and expected drop in LDL cholesterol levels.  Will submit information to patient insurance for approval, then repeat labs after 5-6 doses.

## 2019-02-09 DIAGNOSIS — G4733 Obstructive sleep apnea (adult) (pediatric): Secondary | ICD-10-CM | POA: Diagnosis not present

## 2019-02-13 ENCOUNTER — Other Ambulatory Visit: Payer: Self-pay | Admitting: Family Medicine

## 2019-02-13 DIAGNOSIS — K219 Gastro-esophageal reflux disease without esophagitis: Secondary | ICD-10-CM

## 2019-02-13 DIAGNOSIS — J411 Mucopurulent chronic bronchitis: Secondary | ICD-10-CM | POA: Diagnosis not present

## 2019-02-13 DIAGNOSIS — N401 Enlarged prostate with lower urinary tract symptoms: Secondary | ICD-10-CM | POA: Diagnosis not present

## 2019-02-14 NOTE — Progress Notes (Signed)
Cardiology Office Note    Date:  02/15/2019   ID:  Carlos Brooks, DOB 1926/06/14, MRN [id number]  PCP:  [name], FNP  Cardiologist:   Minus Breeding, MD   No chief complaint on file.     History of Present Illness: Carlos Brooks is a 85 y.o. male who presents for evaluation of dyspnea. He has previously seen Dr. Mare Ferrari .  Since I last saw him I sent for sleep study which was positive.  He has a CPAP and he is struggling to use it.  He is not very excited about it.  He was also to be taking Repatha given his vascular disease and dyslipidemia but he has not started this.  Not sure the barriers with that.  His big issue is that he thinks his memory is getting worse and his wife says this just really started.  He is not having any visual or motor disturbances.  He still able to walk on the bathroom floor and the mother stop without limitations.  He is not describing chest pressure, neck or arm discomfort.  He has not noticed any palpitations, presyncope or syncope.  He does report that he gets "choked up" at night and sounds like he might have some trouble swallowing certain foods and medicines.  He might be describing some aspiration-like symptoms.   Past Medical History:  Diagnosis Date  . BPH (benign prostatic hyperplasia)   . Cancer ([place])    skin  . Cataract   . COPD (chronic obstructive pulmonary disease) ([place])   . Generalized headaches   . GERD (gastroesophageal reflux disease)   . Hiatal hernia   . Hyperlipidemia     Past Surgical History:  Procedure Laterality Date  . EYE SURGERY    . HERNIA REPAIR     x 3, inguinal      Current Outpatient Medications  Medication Sig Dispense Refill  . aspirin 81 MG EC tablet Take 81 mg by mouth daily.      . finasteride (PROSCAR) 5 MG tablet Take 1 tablet (5 mg total) by mouth daily. 90 tablet 1  . guaiFENesin (MUCINEX) 600 MG 12 hr tablet Take 1,200 mg by mouth 2 (two) times daily.    Marland Kitchen LORazepam (ATIVAN) 0.5  MG tablet Take nightly as needed for sleep 30 tablet 5  . pantoprazole (PROTONIX) 40 MG tablet Take 1 tablet (40 mg total) by mouth daily. 30 tablet 2  . PROAIR HFA 108 (90 Base) MCG/ACT inhaler 2 PUFFS EVERY 6 HOURS AS NEEDED FOR WHEEZING OR SHORTNESS OF BREATH 8.5 g 11  . terazosin (HYTRIN) 5 MG capsule TAKE (1) CAPSULE DAILY 90 capsule 0  . Tiotropium Bromide-Olodaterol (STIOLTO RESPIMAT) 2.5-2.5 MCG/ACT AERS Inhale 2 puffs into the lungs daily.    . Vitamin D, Ergocalciferol, (DRISDOL) 1.25 MG (50000 UT) CAPS capsule TAKE 1 CAPSULE ONCE A WEEK 12 capsule 0   No current facility-administered medications for this visit.     Allergies:   Clarithromycin, Crestor [rosuvastatin calcium], Lipitor [atorvastatin calcium], Niaspan [niacin er], Pneumovax [pneumococcal polysaccharide vaccine], Pravachol, Ranitidine hcl, and Simvastatin     ROS:  Please see the history of present illness.   Otherwise, review of systems are positive for none.   All other systems are reviewed and negative.    PHYSICAL EXAM: VS:  BP 120/70   Pulse 64   Ht 5\' 5"  (1.651 m)   Wt 141 lb (64 kg)   BMI 23.46 kg/m  ,  BMI Body mass index is 23.46 kg/m.  GENERAL:  Well appearing NECK:  No jugular venous distention, waveform within normal limits, carotid upstroke brisk and symmetric, no bruits, no thyromegaly LUNGS:  Clear to auscultation bilaterally CHEST:  Unremarkable HEART:  PMI not displaced or sustained,S1 and S2 within normal limits, no S3, no S4, no clicks, no rubs, no murmurs ABD:  Flat, positive bowel sounds normal in frequency in pitch, no bruits, no rebound, no guarding, no midline pulsatile mass, no hepatomegaly, no splenomegaly EXT:  2 plus pulses throughout, no edema, no cyanosis no clubbing   EKG:  EKG is  not ordered today. The ekg ordered 11/30/2018 demonstrates sinus bradycardia, rate 62,  LAD,  left bundle branch block compared to previous.  Unchanged from previous   Recent Labs: 08/09/2018: ALT 13;  BUN 20; Creatinine, Ser 1.07; Hemoglobin 14.0; Platelets 156; Potassium 4.3; Sodium 144    Lipid Panel    Component Value Date/Time   CHOL 200 (H) 08/09/2018 1313   CHOL 197 08/04/2012 1151   TRIG 153 (H) 08/09/2018 1313   TRIG 191 (H) 10/21/2015 1048   TRIG 162 (H) 08/04/2012 1151   HDL 52 08/09/2018 1313   HDL 46 10/21/2015 1048   HDL 45 08/04/2012 1151   CHOLHDL 3.8 08/09/2018 1313   LDLCALC 117 (H) 08/09/2018 1313   LDLCALC 154 (H) 12/05/2013 0920   LDLCALC 120 (H) 08/04/2012 1151      Wt Readings from Last 3 Encounters:  02/15/19 141 lb (64 kg)  11/30/18 139 lb (63 kg)  10/17/18 140 lb (63.5 kg)      Other studies Reviewed: Additional studies/ records that were reviewed today include: Labs Review of the above records demonstrates:  NA   ASSESSMENT AND PLAN:  DYSPNEA:   He actually says this time he is not dyspneic.  No further work-up.   MEMORY: He is having a new memory disorder.  I Minna set him up to see a neurologist.  LBBB: No change in therapy.  This is chronic.   DYSLIPIDEMIA: :    His last LDL was 117.  HDL 52.  I sent a message to our [facility] to call him to see what the barrier is with his medication to treat his dyslipidemia.  I think given his carotid stenosis he would have benefit in this.   SLEEP APNEA:   He has been diagnosed with sleep apnea.  I have encouraged him to continue to work with that.   BRUIT: He had left than 50% plaque with some ulceration.   We will continue with risk reduction.  He can have another follow-up in 1 year.  DIFFICULTY SWALLOWING: It sounds like he is having some trouble perhaps swallowing may be with reflux.  I have asked him to discuss this with his primary care physician.  He might need a swallow study.  COVID EDUCATION: We talked about the vaccine.  Current medicines are reviewed at length with the patient today.  The patient does not have concerns regarding medicines.  The following changes have been made:  None  Labs/ tests ordered today include:  None  No orders of the defined types were placed in this encounter.    Disposition:   FU with me in 12 months.    Signed, Minus Breeding, MD  02/15/2019 1:16 PM    [facility]

## 2019-02-15 ENCOUNTER — Other Ambulatory Visit: Payer: Self-pay

## 2019-02-15 ENCOUNTER — Encounter: Payer: Self-pay | Admitting: Cardiology

## 2019-02-15 ENCOUNTER — Telehealth: Payer: Self-pay

## 2019-02-15 ENCOUNTER — Ambulatory Visit: Payer: PPO | Admitting: Cardiology

## 2019-02-15 VITALS — BP 120/70 | HR 64 | Ht 65.0 in | Wt 141.0 lb

## 2019-02-15 DIAGNOSIS — R1310 Dysphagia, unspecified: Secondary | ICD-10-CM

## 2019-02-15 DIAGNOSIS — G4730 Sleep apnea, unspecified: Secondary | ICD-10-CM | POA: Diagnosis not present

## 2019-02-15 DIAGNOSIS — I447 Left bundle-branch block, unspecified: Secondary | ICD-10-CM

## 2019-02-15 DIAGNOSIS — E785 Hyperlipidemia, unspecified: Secondary | ICD-10-CM | POA: Diagnosis not present

## 2019-02-15 DIAGNOSIS — R0602 Shortness of breath: Secondary | ICD-10-CM

## 2019-02-15 DIAGNOSIS — R0600 Dyspnea, unspecified: Secondary | ICD-10-CM

## 2019-02-15 DIAGNOSIS — R413 Other amnesia: Secondary | ICD-10-CM | POA: Diagnosis not present

## 2019-02-15 DIAGNOSIS — Z7189 Other specified counseling: Secondary | ICD-10-CM

## 2019-02-15 DIAGNOSIS — I6523 Occlusion and stenosis of bilateral carotid arteries: Secondary | ICD-10-CM

## 2019-02-15 MED ORDER — REPATHA SURECLICK 140 MG/ML ~~LOC~~ SOAJ: 140.0000 mg | SUBCUTANEOUS | 11 refills | Status: DC

## 2019-02-15 NOTE — Patient Instructions (Addendum)
Medication Instructions:  The current medical regimen is effective;  continue present plan and medications.  *If you need a refill on your cardiac medications before your next appointment, please call your pharmacy*  You have been referred to [facility] for the evaluation of memory issuses.  Follow-Up: At [facility], you and your health needs are our priority.  As part of our continuing mission to provide you with exceptional heart care, we have created designated Provider Care Teams.  These Care Teams include your primary Cardiologist (physician) and Advanced Practice Providers (APPs -  Physician Assistants and Nurse Practitioners) who all work together to provide you with the care you need, when you need it.  Your next appointment:   12 month(s)  The format for your next appointment:   In Person  Provider:   Minus Breeding, MD  Thank you for choosing [facility]!!

## 2019-02-15 NOTE — Telephone Encounter (Signed)
Called and spoke w/pt stated that the rtepatha approved an rx sent. Instructed the pt to apply for [facility] if unaffordable. Pt voiced understanding

## 2019-02-16 ENCOUNTER — Ambulatory Visit (INDEPENDENT_AMBULATORY_CARE_PROVIDER_SITE_OTHER): Payer: PPO | Admitting: Family Medicine

## 2019-02-16 ENCOUNTER — Encounter: Payer: Self-pay | Admitting: Family Medicine

## 2019-02-16 ENCOUNTER — Encounter: Payer: Self-pay | Admitting: Neurology

## 2019-02-16 VITALS — BP 140/78 | HR 60 | Temp 98.0°F | Resp 20 | Ht 65.0 in | Wt 138.0 lb

## 2019-02-16 DIAGNOSIS — R1310 Dysphagia, unspecified: Secondary | ICD-10-CM

## 2019-02-16 DIAGNOSIS — E785 Hyperlipidemia, unspecified: Secondary | ICD-10-CM

## 2019-02-16 DIAGNOSIS — G252 Other specified forms of tremor: Secondary | ICD-10-CM

## 2019-02-16 DIAGNOSIS — K219 Gastro-esophageal reflux disease without esophagitis: Secondary | ICD-10-CM | POA: Diagnosis not present

## 2019-02-16 DIAGNOSIS — R413 Other amnesia: Secondary | ICD-10-CM | POA: Diagnosis not present

## 2019-02-16 DIAGNOSIS — J4489 Other specified chronic obstructive pulmonary disease: Secondary | ICD-10-CM

## 2019-02-16 DIAGNOSIS — J449 Chronic obstructive pulmonary disease, unspecified: Secondary | ICD-10-CM

## 2019-02-16 DIAGNOSIS — R5383 Other fatigue: Secondary | ICD-10-CM

## 2019-02-16 DIAGNOSIS — I700 Atherosclerosis of aorta: Secondary | ICD-10-CM | POA: Diagnosis not present

## 2019-02-16 NOTE — Progress Notes (Signed)
Subjective:  Patient ID: Carlos Brooks, male    DOB: [birth year], 85 y.o.   MRN: [id number]  Patient Care Team: [name], FNP as PCP - General (Family Medicine) [name], MD as Consulting Physician (Optometry) Hayden Pedro, MD as Consulting Physician (Ophthalmology) Jarome Matin, MD as Consulting Physician (Dermatology) Minus Breeding, MD as Consulting Physician (Cardiology)   Chief Complaint:  Medical Management of Chronic Issues and Hyperlipidemia   HPI: Carlos Brooks is a 85 y.o. male presenting on 02/16/2019 for Medical Management of Chronic Issues and Hyperlipidemia  1. Hyperlipidemia LDL goal <100 Compliant with medications - No Current medications - repatha Side effects from medications - has not started medications Diet - generally healthy Exercise - active daily  Lab Results  Component Value Date   CHOL 210 (H) 02/16/2019   HDL 52 02/16/2019   LDLCALC 127 (H) 02/16/2019   TRIG 175 (H) 02/16/2019   CHOLHDL 4.0 02/16/2019     Family and personal medical history reviewed. Smoking and ETOH history reviewed.    2. COPD (chronic obstructive pulmonary disease) with chronic bronchitis ([place]) Doing well. No increased shortness of breath, cough, or sputum production. Using inhalers as prescribed. SABA used as needed, has not required often.   3. Aortic atherosclerosis ([facility]) On ASA therapy. States he is not taking the Repatha. Does watch diet and is active on a regular basis.   4. Thoracic aorta atherosclerosis ([place]) On ASA therapy, not taking Reaptha. Does watch diet and is active on a regular basis. No chest pain, abdominal pain, paraesthesias, dizziness, weakness, or syncope.    5. Gastroesophageal reflux disease without esophagitis Compliant with medications - Yes Current medications - Protonix Adverse side effects - No Cough - Yes Sore throat - No Voice change - No Hemoptysis - No Dysphagia or dyspepsia - Yes Water brash - No Red  Flags (weight loss, hematochezia, melena, weight loss, early satiety, fevers, odynophagia, or persistent vomiting) - No   6. Fatigue Pt reports ongoing fatigue with increased memory deficits. States he is very forgetful of names, where he places things, and things that have happened recently. He is scheduled to see neurology soon. States he does not have issues with long term memory or problems with paying bills. He can remember directions. He is still able to cook, shower, and take medications. He does have a resting tremor that is concerning for Parkinson Disease. Pt also reports he is having difficulty swallowing some of his foods and medications. States at night he feels "choked up" and is not sure why. He denies a cough or sputum production. No nausea or vomiting.      Relevant past medical, surgical, family, and social history reviewed and updated as indicated.  Allergies and medications reviewed and updated. Date reviewed: Chart in Epic.   Past Medical History:  Diagnosis Date  . BPH (benign prostatic hyperplasia)   . Cancer ([place])    skin  . Cataract   . COPD (chronic obstructive pulmonary disease) ([place])   . Generalized headaches   . GERD (gastroesophageal reflux disease)   . Hiatal hernia   . Hyperlipidemia     Past Surgical History:  Procedure Laterality Date  . EYE SURGERY    . HERNIA REPAIR     x 3, inguinal     Social History   Socioeconomic History  . Marital status: Married    Spouse name: Not on file  . Number of children: 5  .  Years of education: Not on file  . Highest education level: Not on file  Occupational History  . Not on file  Tobacco Use  . Smoking status: Former Smoker    Quit date: 04/13/1993    Years since quitting: 25.8  . Smokeless tobacco: Never Used  Substance and Sexual Activity  . Alcohol use: No  . Drug use: No  . Sexual activity: Never  Other Topics Concern  . Not on file  Social History Narrative  . Not on file   Social  Determinants of Health   Financial Resource Strain: Low Risk   . Difficulty of Paying Living Expenses: Not hard at all  Food Insecurity: No Food Insecurity  . Worried About Charity fundraiser in the Last Year: Never true  . Ran Out of Food in the Last Year: Never true  Transportation Needs: No Transportation Needs  . Lack of Transportation (Medical): No  . Lack of Transportation (Non-Medical): No  Physical Activity: Inactive  . Days of Exercise per Week: 0 days  . Minutes of Exercise per Session: 0 min  Stress: No Stress Concern Present  . Feeling of Stress : Not at all  Social Connections: Not Isolated  . Frequency of Communication with Friends and Family: More than three times a week  . Frequency of Social Gatherings with Friends and Family: More than three times a week  . Attends Religious Services: More than 4 times per year  . Active Member of Clubs or Organizations: Yes  . Attends Archivist Meetings: More than 4 times per year  . Marital Status: Married  Human resources officer Violence:   . Fear of Current or Ex-Partner: Not on file  . Emotionally Abused: Not on file  . Physically Abused: Not on file  . Sexually Abused: Not on file    Outpatient Encounter Medications as of 02/16/2019  Medication Sig  . aspirin 81 MG EC tablet Take 81 mg by mouth daily.    . finasteride (PROSCAR) 5 MG tablet Take 1 tablet (5 mg total) by mouth daily.  Marland Kitchen guaiFENesin (MUCINEX) 600 MG 12 hr tablet Take 1,200 mg by mouth 2 (two) times daily.  Marland Kitchen LORazepam (ATIVAN) 0.5 MG tablet Take nightly as needed for sleep  . pantoprazole (PROTONIX) 40 MG tablet Take 1 tablet (40 mg total) by mouth daily.  Marland Kitchen PROAIR HFA 108 (90 Base) MCG/ACT inhaler 2 PUFFS EVERY 6 HOURS AS NEEDED FOR WHEEZING OR SHORTNESS OF BREATH  . terazosin (HYTRIN) 5 MG capsule TAKE (1) CAPSULE DAILY  . Tiotropium Bromide-Olodaterol (STIOLTO RESPIMAT) 2.5-2.5 MCG/ACT AERS Inhale 2 puffs into the lungs daily.  . Vitamin D,  Ergocalciferol, (DRISDOL) 1.25 MG (50000 UT) CAPS capsule TAKE 1 CAPSULE ONCE A WEEK  . Evolocumab (REPATHA SURECLICK) 943 MG/ML SOAJ Inject 140 mg into the skin every 14 (fourteen) days. (Patient not taking: Reported on 02/16/2019)   No facility-administered encounter medications on file as of 02/16/2019.    Allergies  Allergen Reactions  . Clarithromycin   . Crestor [Rosuvastatin Calcium]   . Lipitor [Atorvastatin Calcium]   . Niaspan [Niacin Er]   . Pneumovax [Pneumococcal Polysaccharide Vaccine]     Arm swelling and rash  . Pravachol   . Ranitidine Hcl   . Simvastatin     Review of Systems  Constitutional: Positive for activity change and fatigue. Negative for appetite change, chills, diaphoresis, fever and unexpected weight change.  HENT: Positive for trouble swallowing. Negative for sore throat and voice change.  Eyes: Negative.  Negative for photophobia and visual disturbance.  Respiratory: Positive for cough and choking. Negative for apnea, chest tightness and shortness of breath.   Cardiovascular: Negative for chest pain, palpitations and leg swelling.  Gastrointestinal: Negative for abdominal pain, blood in stool, constipation, diarrhea, nausea and vomiting.  Endocrine: Negative.  Negative for cold intolerance, heat intolerance, polydipsia, polyphagia and polyuria.  Genitourinary: Negative for decreased urine volume, difficulty urinating, dysuria, frequency and urgency.  Musculoskeletal: Negative for arthralgias and myalgias.  Skin: Negative.   Allergic/Immunologic: Negative.   Neurological: Positive for tremors. Negative for dizziness, seizures, syncope, facial asymmetry, speech difficulty, weakness, light-headedness, numbness and headaches.  Hematological: Negative.   Psychiatric/Behavioral: Positive for confusion. Negative for agitation, behavioral problems, decreased concentration, dysphoric mood, hallucinations, self-injury, sleep disturbance and suicidal ideas. The  patient is not nervous/anxious and is not hyperactive.   All other systems reviewed and are negative.       Objective:  BP 140/78   Pulse 60   Temp 98 F (36.7 C)   Resp 20   Ht '5\' 5"'$  (1.651 m)   Wt 138 lb (62.6 kg)   SpO2 98%   BMI 22.96 kg/m    Wt Readings from Last 3 Encounters:  02/16/19 138 lb (62.6 kg)  02/15/19 141 lb (64 kg)  11/30/18 139 lb (63 kg)    Physical Exam Vitals and nursing note reviewed.  Constitutional:      General: He is not in acute distress.    Appearance: Normal appearance. He is well-developed, well-groomed and normal weight. He is not ill-appearing, toxic-appearing or diaphoretic.  HENT:     Head: Normocephalic and atraumatic.     Jaw: There is normal jaw occlusion.     Right Ear: Hearing normal.     Left Ear: Hearing normal.     Nose: Nose normal.     Mouth/Throat:     Lips: Pink.     Mouth: Mucous membranes are moist.     Pharynx: Oropharynx is clear. Uvula midline.  Eyes:     General: Lids are normal.     Extraocular Movements: Extraocular movements intact.     Conjunctiva/sclera: Conjunctivae normal.     Pupils: Pupils are equal, round, and reactive to light.  Neck:     Thyroid: No thyroid mass, thyromegaly or thyroid tenderness.     Vascular: No carotid bruit or JVD.     Trachea: Trachea and phonation normal.  Cardiovascular:     Rate and Rhythm: Normal rate and regular rhythm.     Chest Wall: PMI is not displaced.     Pulses: Normal pulses.     Heart sounds: Normal heart sounds. No murmur. No friction rub. No gallop.   Pulmonary:     Effort: Pulmonary effort is normal. No respiratory distress.     Breath sounds: Normal breath sounds. No wheezing.  Abdominal:     General: Bowel sounds are normal. There is no distension or abdominal bruit.     Palpations: Abdomen is soft. There is no hepatomegaly or splenomegaly.     Tenderness: There is no abdominal tenderness. There is no right CVA tenderness or left CVA tenderness.      Hernia: No hernia is present.  Musculoskeletal:        General: Normal range of motion.     Cervical back: Normal range of motion and neck supple.     Right lower leg: No edema.     Left lower leg: No edema.  Lymphadenopathy:     Cervical: No cervical adenopathy.  Skin:    General: Skin is warm and dry.     Capillary Refill: Capillary refill takes less than 2 seconds.     Coloration: Skin is not cyanotic, jaundiced or pale.     Findings: No rash.  Neurological:     General: No focal deficit present.     Mental Status: He is alert and oriented to person, place, and time.     Cranial Nerves: Cranial nerves are intact.     Sensory: Sensation is intact.     Motor: Motor function is intact. No weakness.     Coordination: Coordination is intact.     Gait: Gait abnormal (slowed).     Deep Tendon Reflexes: Reflexes are normal and symmetric.  Psychiatric:        Attention and Perception: Attention and perception normal.        Mood and Affect: Mood and affect normal.        Speech: Speech normal.        Behavior: Behavior normal. Behavior is cooperative.        Thought Content: Thought content normal.        Cognition and Memory: Memory is impaired.        Judgment: Judgment normal.     Results for orders placed or performed in visit on 02/16/19  CMP14+EGFR  Result Value Ref Range   Glucose 91 65 - 99 mg/dL   BUN 17 10 - 36 mg/dL   Creatinine, Ser 1.15 0.76 - 1.27 mg/dL   GFR calc non Af Amer 56 (L) >59 mL/min/1.73   GFR calc Af Amer 64 >59 mL/min/1.73   BUN/Creatinine Ratio 15 10 - 24   Sodium 139 134 - 144 mmol/L   Potassium 4.4 3.5 - 5.2 mmol/L   Chloride 100 96 - 106 mmol/L   CO2 25 20 - 29 mmol/L   Calcium 9.8 8.6 - 10.2 mg/dL   Total Protein 6.8 6.0 - 8.5 g/dL   Albumin 4.6 3.5 - 4.6 g/dL   Globulin, Total 2.2 1.5 - 4.5 g/dL   Albumin/Globulin Ratio 2.1 1.2 - 2.2   Bilirubin Total 0.3 0.0 - 1.2 mg/dL   Alkaline Phosphatase 51 39 - 117 IU/L   AST 24 0 - 40 IU/L   ALT  16 0 - 44 IU/L  Lipid panel  Result Value Ref Range   Cholesterol, Total 210 (H) 100 - 199 mg/dL   Triglycerides 175 (H) 0 - 149 mg/dL   HDL 52 >39 mg/dL   VLDL Cholesterol Cal 31 5 - 40 mg/dL   LDL Chol Calc (NIH) 127 (H) 0 - 99 mg/dL   Chol/HDL Ratio 4.0 0.0 - 5.0 ratio  CBC with Differential/Platelet  Result Value Ref Range   WBC 8.2 3.4 - 10.8 x10E3/uL   RBC 4.43 4.14 - 5.80 x10E6/uL   Hemoglobin 14.0 13.0 - 17.7 g/dL   Hematocrit 41.2 37.5 - 51.0 %   MCV 93 79 - 97 fL   MCH 31.6 26.6 - 33.0 pg   MCHC 34.0 31.5 - 35.7 g/dL   RDW 13.1 11.6 - 15.4 %   Platelets 177 150 - 450 x10E3/uL   Neutrophils 47 Not Estab. %   Lymphs 27 Not Estab. %   Monocytes 10 Not Estab. %   Eos 14 Not Estab. %   Basos 1 Not Estab. %   Neutrophils Absolute 3.9 1.4 - 7.0 x10E3/uL   Lymphocytes Absolute  2.2 0.7 - 3.1 x10E3/uL   Monocytes Absolute 0.8 0.1 - 0.9 x10E3/uL   EOS (ABSOLUTE) 1.1 (H) 0.0 - 0.4 x10E3/uL   Basophils Absolute 0.1 0.0 - 0.2 x10E3/uL   Immature Granulocytes 1 Not Estab. %   Immature Grans (Abs) 0.1 0.0 - 0.1 x10E3/uL  Thyroid Panel With TSH  Result Value Ref Range   TSH 1.580 0.450 - 4.500 uIU/mL   T4, Total 7.3 4.5 - 12.0 ug/dL   T3 Uptake Ratio 25 24 - 39 %   Free Thyroxine Index 1.8 1.2 - 4.9       Pertinent labs & imaging results that were available during my care of the patient were reviewed by me and considered in my medical decision making.  Assessment & Plan:  Carlos Brooks was seen today for medical management of chronic issues and hyperlipidemia.  Diagnoses and all orders for this visit:  Hyperlipidemia LDL goal <100 Diet encouraged - increase intake of fresh fruits and vegetables, increase intake of lean proteins. Bake, broil, or grill foods. Avoid fried, greasy, and fatty foods. Avoid fast foods. Increase intake of fiber-rich whole grains. Exercise encouraged - at least 150 minutes per week and advance as tolerated.  Goal BMI < 25. Continue medications as  prescribed. Follow up in 3-6 months as discussed. Will discuss need to start Repatha once labs have resulted.  -     CMP14+EGFR -     Lipid panel  COPD (chronic obstructive pulmonary disease) with chronic bronchitis (HCC) Doing well on current regimen. Labs pending. Continue medications as prescribed.  -     CBC with Differential/Platelet  Aortic atherosclerosis (HCC) Thoracic aorta atherosclerosis ([facility]CC) Continue ASA therapy. Will discuss Repatha once labs have resulted.  -     CMP14+EGFR -     Lipid panel -     CBC with Differential/Platelet  Gastroesophageal reflux disease without esophagitis New symptoms of dysphagia and trouble swallowing, will refer to GI for EGD or barium swallow. Labs pending. Continue Protonix as prescribed.  -     CBC with Differential/Platelet  Other fatigue Labs pending.  -     CMP14+EGFR -     CBC with Differential/Platelet -     Thyroid Panel With TSH  Memory deficit Tremor, coarse Worsening short term memory loss along with a coarse upper extremity tremor. Referral to neurology for evaluation.  -     Ambulatory referral to Neurology  Difficulty swallowing solids New symptoms, referral placed to GI and neurology. Could be related to GERD or possible Parkinson Disease.  -     Ambulatory referral to Neurology -     Ambulatory referral to Gastroenterology    Total time spent with patient 45 minutes.  Greater than 50% of encounter spent in coordination of care/counseling.  Continue all other maintenance medications.  Follow up plan: Return in about 4 months (around 06/17/2019), or if symptoms worsen or fail to improve.  Continue healthy lifestyle choices, including diet (rich in fruits, vegetables, and lean proteins, and low in salt and simple carbohydrates) and exercise (at least 30 minutes of moderate physical activity daily).  Educational handout given for survey and COVID-19  The above assessment and management plan was discussed with the  patient. The patient verbalized understanding of and has agreed to the management plan. Patient is aware to call the clinic if they develop any new symptoms or if symptoms persist or worsen. Patient is aware when to return to the clinic for a follow-up visit. Patient educated  on when it is appropriate to go to the emergency department.   Monia Pouch, FNP-C [facility] [phone]

## 2019-02-16 NOTE — Patient Instructions (Signed)
It was a pleasure seeing you today, Carlos Brooks.  Information regarding what we discussed is included in this packet.  Please make an appointment to see me in 4 months.    If you had labs performed today, you will be contacted with the abnormal results once they are available, usually in the next 3 business days for routine lab work.  If you had STI testing, a pap smear, or a biopsy performed, expect to be contacted in about 7-10 days. If results are normal, you will not be notified.    In a few days you may receive a survey in the mail or online from [employer] regarding your visit with [place] today. Please take a moment to fill this out. Your feedback is very important to our office. It can help [place] better understand your needs as well as improve your experience and satisfaction. Thank you for taking your time to complete it. We care about you.  Because of recent events of COVID-19 ("Coronavirus"), please follow CDC recommendations:   1. Wash your hand frequently 2. Avoid touching your face 3. Stay away from people who are sick 4. If you have symptoms such as fever, cough, shortness of breath then call your healthcare provider for further guidance 5. If you are sick, STAY AT HOME, unless otherwise directed by your healthcare provider. 6. Follow directions from state and national officials regarding staying safe    Please feel free to call our office if any questions or concerns arise.  Warm Regards, Monia Pouch, FNP-C [facility] [address] [place], [place] [address] [phone]

## 2019-02-17 DIAGNOSIS — G252 Other specified forms of tremor: Secondary | ICD-10-CM | POA: Insufficient documentation

## 2019-02-17 LAB — LIPID PANEL
Chol/HDL Ratio: 4 ratio (ref 0.0–5.0)
Cholesterol, Total: 210 mg/dL — ABNORMAL HIGH (ref 100–199)
HDL: 52 mg/dL (ref >39)
LDL Chol Calc (NIH): 127 mg/dL — ABNORMAL HIGH (ref 0–99)
Triglycerides: 175 mg/dL — ABNORMAL HIGH (ref 0–149)
VLDL Cholesterol Cal: 31 mg/dL (ref 5–40)

## 2019-02-17 LAB — CMP14+EGFR
ALT: 16 IU/L (ref 0–44)
AST: 24 IU/L (ref 0–40)
Albumin/Globulin Ratio: 2.1 (ref 1.2–2.2)
Albumin: 4.6 g/dL (ref 3.5–4.6)
Alkaline Phosphatase: 51 IU/L (ref 39–117)
BUN/Creatinine Ratio: 15 (ref 10–24)
BUN: 17 mg/dL (ref 10–36)
Bilirubin Total: 0.3 mg/dL (ref 0.0–1.2)
CO2: 25 mmol/L (ref 20–29)
Calcium: 9.8 mg/dL (ref 8.6–10.2)
Chloride: 100 mmol/L (ref 96–106)
Creatinine, Ser: 1.15 mg/dL (ref 0.76–1.27)
GFR calc Af Amer: 64 mL/min/{1.73_m2} (ref >59)
GFR calc non Af Amer: 56 mL/min/{1.73_m2} — ABNORMAL LOW (ref >59)
Globulin, Total: 2.2 g/dL (ref 1.5–4.5)
Glucose: 91 mg/dL (ref 65–99)
Potassium: 4.4 mmol/L (ref 3.5–5.2)
Sodium: 139 mmol/L (ref 134–144)
Total Protein: 6.8 g/dL (ref 6.0–8.5)

## 2019-02-17 LAB — CBC WITH DIFFERENTIAL/PLATELET
Basophils Absolute: 0.1 10*3/uL (ref 0.0–0.2)
Basos: 1 %
EOS (ABSOLUTE): 1.1 10*3/uL — ABNORMAL HIGH (ref 0.0–0.4)
Eos: 14 %
Hematocrit: 41.2 % (ref 37.5–51.0)
Hemoglobin: 14 g/dL (ref 13.0–17.7)
Immature Grans (Abs): 0.1 10*3/uL (ref 0.0–0.1)
Immature Granulocytes: 1 %
Lymphocytes Absolute: 2.2 10*3/uL (ref 0.7–3.1)
Lymphs: 27 %
MCH: 31.6 pg (ref 26.6–33.0)
MCHC: 34 g/dL (ref 31.5–35.7)
MCV: 93 fL (ref 79–97)
Monocytes Absolute: 0.8 10*3/uL (ref 0.1–0.9)
Monocytes: 10 %
Neutrophils Absolute: 3.9 10*3/uL (ref 1.4–7.0)
Neutrophils: 47 %
Platelets: 177 10*3/uL (ref 150–450)
RBC: 4.43 x10E6/uL (ref 4.14–5.80)
RDW: 13.1 % (ref 11.6–15.4)
WBC: 8.2 10*3/uL (ref 3.4–10.8)

## 2019-02-17 LAB — THYROID PANEL WITH TSH
Free Thyroxine Index: 1.8 (ref 1.2–4.9)
T3 Uptake Ratio: 25 % (ref 24–39)
T4, Total: 7.3 ug/dL (ref 4.5–12.0)
TSH: 1.58 u[IU]/mL (ref 0.450–4.500)

## 2019-02-17 NOTE — Progress Notes (Signed)
Glucose normal. Liver function normal. Renal function slightly declined as it has been in the past. Make sure you are drinking plenty of water. Will continue to monitor. Thyroid function is normal. CBC is normal. Cholesterol is high, total is 210, LDL is 127, and triglycerides are 175. Limit intake of fried, greasy, fatty, or fast foods. Can take over the counter Red Yeast Rice and Omega 3 fish oil if you do not want to start the [place].

## 2019-03-01 ENCOUNTER — Telehealth: Payer: Self-pay | Admitting: Cardiology

## 2019-03-01 NOTE — Telephone Encounter (Signed)
Called and spoke w/pt about repatha and to take them every 14 days and how to inject. I let the pt know that the drug is ready for pick up at the pharmacy. Pt voiced understanding.

## 2019-03-01 NOTE — Telephone Encounter (Signed)
Pt c/o medication issue:  1. Name of Medication: Evolocumab (REPATHA SURECLICK) [id number] MG/ML SOAJ  2. How are you currently taking this medication (dosage and times per day)? Pt has not started this medication yet  3. Are you having a reaction (difficulty breathing--STAT)? no  4. What is your medication issue? PT has not heard anything from our office about what he needs to do next to get his medication

## 2019-03-06 ENCOUNTER — Ambulatory Visit: Payer: PPO

## 2019-03-06 ENCOUNTER — Other Ambulatory Visit: Payer: Self-pay | Admitting: Family Medicine

## 2019-03-06 ENCOUNTER — Other Ambulatory Visit: Payer: Self-pay

## 2019-03-12 DIAGNOSIS — G4733 Obstructive sleep apnea (adult) (pediatric): Secondary | ICD-10-CM | POA: Diagnosis not present

## 2019-03-28 ENCOUNTER — Other Ambulatory Visit: Payer: Self-pay | Admitting: *Deleted

## 2019-04-05 ENCOUNTER — Encounter: Payer: Self-pay | Admitting: Gastroenterology

## 2019-04-05 ENCOUNTER — Ambulatory Visit (INDEPENDENT_AMBULATORY_CARE_PROVIDER_SITE_OTHER): Payer: PPO | Admitting: Gastroenterology

## 2019-04-05 VITALS — BP 126/62 | HR 74 | Temp 97.9°F | Ht 66.0 in | Wt 141.0 lb

## 2019-04-05 DIAGNOSIS — F0390 Unspecified dementia without behavioral disturbance: Secondary | ICD-10-CM

## 2019-04-05 DIAGNOSIS — R1310 Dysphagia, unspecified: Secondary | ICD-10-CM

## 2019-04-05 NOTE — Progress Notes (Signed)
Carlos Brooks    FJ:[id number]    [birth year]  Primary Care Physician:[name], [name], FNP  Referring Physician: [name], [address] [place],  [place] [address]   Chief complaint:  Dysphagia  HPI: 86 year old male with history of chronic GERD, previously followed by Dr. Sharlett Iles is here with complaints of dysphagia.  He was last seen in 2007 Accompanied by his daughter  Patient complains that all his blood is going into his head and causing problem.  He lives with his wife who is 76. Denies any episodes of food impaction but according to daughter he coughs and also regurgitates food. No vomiting, no change in bowel habits or weight recently.  Denies any melena or blood per rectum.  Unable to obtain history from patient as he constantly refers to blood going to his head and does not respond appropriately to the questions.  His daughter does not live with him but helps quite a bit.  Reviewed chart and recent procedures and imaging in epic. Flexible sigmoidoscopy MRI C-spine: 08/01/2013: Mild degenerative disc disease at C6-7.   Outpatient Encounter Medications as of 04/05/2019  Medication Sig  . aspirin 81 MG EC tablet Take 81 mg by mouth daily.    . Evolocumab (REPATHA SURECLICK) XX123456 MG/ML SOAJ Inject 140 mg into the skin every 14 (fourteen) days.  . finasteride (PROSCAR) 5 MG tablet Take 1 tablet (5 mg total) by mouth daily.  Marland Kitchen guaiFENesin (MUCINEX) 600 MG 12 hr tablet Take 1,200 mg by mouth 2 (two) times daily.  Marland Kitchen LORazepam (ATIVAN) 0.5 MG tablet Take nightly as needed for sleep  . pantoprazole (PROTONIX) 40 MG tablet Take 1 tablet (40 mg total) by mouth daily.  Marland Kitchen PROAIR HFA 108 (90 Base) MCG/ACT inhaler 2 PUFFS EVERY 6 HOURS AS NEEDED FOR WHEEZING OR SHORTNESS OF BREATH  . terazosin (HYTRIN) 5 MG capsule TAKE (1) CAPSULE DAILY  . Vitamin D, Ergocalciferol, (DRISDOL) 1.25 MG (50000 UT) CAPS capsule TAKE 1 CAPSULE ONCE A WEEK  . [DISCONTINUED]  Tiotropium Bromide-Olodaterol (STIOLTO RESPIMAT) 2.5-2.5 MCG/ACT AERS Inhale 2 puffs into the lungs daily.   No facility-administered encounter medications on file as of 04/05/2019.    Allergies as of 04/05/2019 - Review Complete 04/05/2019  Allergen Reaction Noted  . Clarithromycin    . Crestor [rosuvastatin calcium]  06/20/2010  . Lipitor [atorvastatin calcium]  06/20/2010  . Niaspan [niacin er]  06/20/2010  . Pneumovax [pneumococcal polysaccharide vaccine]  07/13/2013  . Pravachol  06/20/2010  . Ranitidine hcl    . Simvastatin      Past Medical History:  Diagnosis Date  . BPH (benign prostatic hyperplasia)   . Cancer ([place])    skin  . Cataract   . COPD (chronic obstructive pulmonary disease) ([place])   . Generalized headaches   . GERD (gastroesophageal reflux disease)   . Hiatal hernia   . Hyperlipidemia     Past Surgical History:  Procedure Laterality Date  . EYE SURGERY    . HERNIA REPAIR     x 3, inguinal     Family History  Problem Relation Age of Onset  . Cancer Mother        [name]  . Cancer Father        lung  . Heart attack Brother 66       Not clear details  . Heart attack Son 57       Died with an MI  .  Cancer Brother        liver  . Heart attack Brother 31  . Aneurysm Brother     Social History   Socioeconomic History  . Marital status: Married    Spouse name: Not on file  . Number of children: 5  . Years of education: Not on file  . Highest education level: Not on file  Occupational History  . Not on file  Tobacco Use  . Smoking status: Former Smoker    Quit date: 04/13/1993    Years since quitting: 25.9  . Smokeless tobacco: Never Used  Substance and Sexual Activity  . Alcohol use: No  . Drug use: No  . Sexual activity: Never  Other Topics Concern  . Not on file  Social History Narrative  . Not on file   Social Determinants of Health   Financial Resource Strain:   . Difficulty of Paying Living Expenses: Not on file  Food  Insecurity:   . Worried About Charity fundraiser in the Last Year: Not on file  . Ran Out of Food in the Last Year: Not on file  Transportation Needs:   . Lack of Transportation (Medical): Not on file  . Lack of Transportation (Non-Medical): Not on file  Physical Activity:   . Days of Exercise per Week: Not on file  . Minutes of Exercise per Session: Not on file  Stress:   . Feeling of Stress : Not on file  Social Connections:   . Frequency of Communication with Friends and Family: Not on file  . Frequency of Social Gatherings with Friends and Family: Not on file  . Attends Religious Services: Not on file  . Active Member of Clubs or Organizations: Not on file  . Attends Archivist Meetings: Not on file  . Marital Status: Not on file  Intimate Partner Violence:   . Fear of Current or Ex-Partner: Not on file  . Emotionally Abused: Not on file  . Physically Abused: Not on file  . Sexually Abused: Not on file      Review of systems: All other review of systems negative except as mentioned in the HPI.   Physical Exam: Vitals:   04/05/19 1006  BP: 126/62  Pulse: 74  Temp: 97.9 F (36.6 C)   Body mass index is 22.76 kg/m. Gen:      No acute distress HEENT:  EOMI, sclera anicteric Neck:     No masses; no thyromegaly Lungs:    Clear to auscultation bilaterally; normal respiratory effort CV:         Regular rate and rhythm; no murmurs Abd:      + bowel sounds; soft, non-tender; no palpable masses, no distension Ext:    No edema; adequate peripheral perfusion Skin:      Warm and dry; no rash Neuro: alert and oriented x 1 Psych: normal mood and affect  Data Reviewed:  Reviewed labs, radiology imaging, old records and pertinent past GI work up   Assessment and Plan/Recommendations:  86 year old male here for evaluation of solid dysphagia  Obtain barium esophagram to exclude any mass lesions, neoplasia or severe esophageal strictures If barium esophagram  unremarkable, will hold off EGD due to potential risk with invasive procedures and anesthesia  He appears to have progressive dementia, unable to provide me his date of birth and is not oriented to time or place Advised follow-up with PMD to consider referral for evaluation and management of dementia  Follow-up as needed  This visit required 45 minutes of patient care (this includes precharting, chart review, review of results, face-to-face time used for counseling as well as treatment plan and follow-up. The patient was provided an opportunity to ask questions and all were answered. The patient agreed with the plan and demonstrated an understanding of the instructions.  Damaris Hippo , MD    CC: [name], FNP

## 2019-04-05 NOTE — Patient Instructions (Addendum)
If you are age 84 or older, your body mass index should be between 23-30. Your Body mass index is 22.76 kg/m. If this is out of the aforementioned range listed, please consider follow up with your Primary Care Provider.  If you are age 30 or younger, your body mass index should be between 19-25. Your Body mass index is 22.76 kg/m. If this is out of the aformentioned range listed, please consider follow up with your Primary Care Provider.   You have been scheduled for a Barium Esophogram at [facility] (1st floor of the hospital) on 04/07/19 at 11 am. Please arrive 15 minutes prior to your appointment for registration. Make certain not to have anything to eat or drink 3 hours prior to your test. If you need to reschedule for any reason, please contact radiology at [phone] to do so. __________________________________________________________________ A barium swallow is an examination that concentrates on views of the esophagus. This tends to be a double contrast exam (barium and two liquids which, when combined, create a gas to distend the wall of the oesophagus) or single contrast (non-ionic iodine based). The study is usually tailored to your symptoms so a good history is essential. Attention is paid during the study to the form, structure and configuration of the esophagus, looking for functional disorders (such as aspiration, dysphagia, achalasia, motility and reflux) EXAMINATION You may be asked to change into a gown, depending on the type of swallow being performed. A radiologist and radiographer will perform the procedure. The radiologist will advise you of the type of contrast selected for your procedure and direct you during the exam. You will be asked to stand, sit or lie in several different positions and to hold a small amount of fluid in your mouth before being asked to swallow while the imaging is performed .In some instances you may be asked to swallow barium coated marshmallows  to assess the motility of a solid food bolus. The exam can be recorded as a digital or video fluoroscopy procedure. POST PROCEDURE It will take 1-2 days for the barium to pass through your system. To facilitate this, it is important, unless otherwise directed, to increase your fluids for the next 24-48hrs and to resume your normal diet.  This test typically takes about 30 minutes to perform. _______________________________________________________________________  Start a pureed/soft diet - see below.  You are being referred to your primary care physician for evaluation of dementia.  Thank you for choosing me and [facility].   Harl Bowie, MD   Dysphagia Eating Plan, Pureed This diet is helpful for people with moderate to severe swallowing problems. Pureed foods are smooth and are prepared without lumps so that they can be swallowed safely. Work with your health care provider and your diet and nutrition specialist (dietitian) to make sure that you are following the diet safely and getting all the nutrients you need. What are tips for following this plan? General instructions  You may eat foods that are soft and have a pudding-like texture.  Do not eat foods that you have to chew. If you have to chew the food, then you cannot eat it.  Avoid foods that are hard, dry, sticky, chunky, lumpy, or stringy. Also avoid foods with nuts, seeds, raisins, skins, or pulp.  You may be instructed to thicken liquids. Follow your health care provider's instructions about how to do this and to what consistency. Cooking   If a food is not originally a smooth texture, you may be  able to eat the food after: ? Pureeing it. This can be done with a blender. ? Moistening it. This can be done by adding juice, cooking liquid, gravy, or sauce to a dry food and then pureeing it. For example, you may have bread if you soak it in milk and puree it.  If a food is too thin, you may add a  commercial thickener, corn starch, rice cereal, or potato flakes to thicken it.  Strain and throw away any liquid that separates from a solid pureed food before eating.  Strain lumps, chunks, pulp, and seeds from pureed foods before eating.  Reheat foods slowly to prevent a tough crust from forming. Meal planning  Eat a variety of foods to get all the nutrients you need.  Add dry milk or protein powder to food to increase calories and protein content.  Follow your meal plan as told by your dietitian. What foods are allowed? The items listed may not be a complete list. Talk with your dietitian about what dietary choices are best for you. Grains Soft breads, pancakes, [place] toast, muffins, and bread stuffing pureed to a smooth, moist texture, without nuts or seeds. Cooked cereals that have a pudding-like consistency, such as cream of wheat or farina. Pureed oatmeal. Pureed, well-cooked pasta and rice. Vegetables Pureed vegetables. Smooth tomato paste or sauce. Mashed or pureed potatoes without skin. Fruits Pureed fruits such as melons and apples without seeds or pulp. Mashed bananas. Mashed avocado. Fruit juices without pulp or seeds. Meats and other protein foods Pureed meat, poultry, and fish. Smooth pate or liverwurst. Smooth souffles. Pureed beans (such as lentils). Pureed eggs. Smooth nut and seed butters. Pureed tofu. Dairy Yogurt. Milk. Pureed cottage cheese. Nutritional dairy drinks or shakes. Cream cheese. Smooth pudding, ice cream, sherbet, and malts. Fats and oils Butter. Margarine. Vegetable oils. Smooth and strained gravy. Sour cream. Mayonnaise. Smooth sauces such as white sauce, cheese sauce, or hollandaise sauce. Sweets and desserts Moistened and pureed cookies and cakes. Whipped topping. Gelatin. Pudding pops. Seasoning and other foods Finely ground spices. Jelly. Honey. Pureed casseroles. Strained soups. Pureed sandwiches. Beverages Anything prepared at the  consistency recommended by your dietitian. What foods are not allowed? The items listed may not be a complete list. Talk with your dietitian about what dietary choices are best for you. Grains Oatmeal. Dry cereals. Hard breads. Breads with seeds or nuts. Whole pasta, rice, or other grains. Whole pancakes, waffles, biscuits, muffins, or rolls. Vegetables Whole vegetables. Stringy vegetables (such as celery). Tomatoes or tomato sauce with seeds. Fried vegetables. Fruits Whole fresh, frozen, canned, or dried fruits that have not been pureed. Stringy fruits, such as pineapple or coconut. Watermelon with seeds. Dried fruit or fruit leather. Meat and other protein foods Whole or ground meat, fish, or poultry. Dried or cooked lentils or legumes that have been cooked but not mashed or pureed. Non-pureed eggs. Nuts and seeds. Crunchy peanut butter. Whole tofu or other meat alternatives. Dairy Cheese cubes or slices. Non-pureed cottage cheese. Yogurt with fruit chunks. Fats and oils All fats and sauces that have lumps or chunks. Sweets and desserts Solid desserts. Sticky, chewy sweets (such as licorice and caramel). Candy with nuts or coconut. Seasoning and other foods Coarse or seeded herbs and spices. Chunky preserves. Jams with seeds. Whole sandwiches. Non-pureed casseroles. Chunky soups. Summary  Pureed foods can be helpful for people with moderate to severe swallowing problems.  On the dysphagia eating plan, you may eat foods that are soft  and have a pudding-like texture. You should avoid foods that you have to chew. If you have to chew the food, then you cannot eat it.  You may be instructed to thicken liquids. Follow your health care provider's instructions about how to do this and to what consistency. This information is not intended to replace advice given to you by your health care provider. Make sure you discuss any questions you have with your health care provider. Document Revised:  06/16/2018 Document Reviewed: 04/28/2016 Elsevier Patient Education  [place].

## 2019-04-07 ENCOUNTER — Other Ambulatory Visit: Payer: Self-pay

## 2019-04-07 ENCOUNTER — Ambulatory Visit (HOSPITAL_COMMUNITY)
Admission: RE | Admit: 2019-04-07 | Discharge: 2019-04-07 | Disposition: A | Discharge status: Home / Self Care | Payer: PPO | Source: Ambulatory Visit | Attending: Gastroenterology | Admitting: Gastroenterology

## 2019-04-07 DIAGNOSIS — K219 Gastro-esophageal reflux disease without esophagitis: Secondary | ICD-10-CM | POA: Diagnosis not present

## 2019-04-07 DIAGNOSIS — R1310 Dysphagia, unspecified: Secondary | ICD-10-CM

## 2019-04-11 ENCOUNTER — Encounter: Payer: Self-pay | Admitting: Gastroenterology

## 2019-04-12 ENCOUNTER — Other Ambulatory Visit: Payer: Self-pay | Admitting: Family Medicine

## 2019-04-12 DIAGNOSIS — F5101 Primary insomnia: Secondary | ICD-10-CM

## 2019-04-12 DIAGNOSIS — G4733 Obstructive sleep apnea (adult) (pediatric): Secondary | ICD-10-CM | POA: Diagnosis not present

## 2019-04-12 DIAGNOSIS — Z79899 Other long term (current) drug therapy: Secondary | ICD-10-CM

## 2019-04-17 ENCOUNTER — Telehealth: Payer: Self-pay

## 2019-04-17 NOTE — Telephone Encounter (Signed)
Referral faxed to Darla Lesches, FNP for Advanced Dementia.

## 2019-04-20 ENCOUNTER — Telehealth: Payer: Self-pay | Admitting: Family Medicine

## 2019-04-20 NOTE — Telephone Encounter (Signed)
FYI Patient was wondering if provider could do telephone visit for lorazepam refills but I scheduled office visit.

## 2019-04-20 NOTE — Telephone Encounter (Signed)
Appointments scheduled for him and his wife to get lorazepam refills.

## 2019-04-25 ENCOUNTER — Other Ambulatory Visit: Payer: Self-pay

## 2019-04-25 ENCOUNTER — Other Ambulatory Visit: Payer: Self-pay | Admitting: Family Medicine

## 2019-04-25 DIAGNOSIS — N400 Enlarged prostate without lower urinary tract symptoms: Secondary | ICD-10-CM

## 2019-04-26 ENCOUNTER — Ambulatory Visit: Payer: PPO | Admitting: Neurology

## 2019-04-26 ENCOUNTER — Ambulatory Visit (INDEPENDENT_AMBULATORY_CARE_PROVIDER_SITE_OTHER): Payer: PPO | Admitting: Family Medicine

## 2019-04-26 ENCOUNTER — Other Ambulatory Visit: Payer: Self-pay

## 2019-04-26 ENCOUNTER — Encounter: Payer: Self-pay | Admitting: Family Medicine

## 2019-04-26 VITALS — BP 133/65 | HR 63 | Temp 97.7°F | Resp 20 | Ht 66.0 in | Wt 141.0 lb

## 2019-04-26 DIAGNOSIS — I700 Atherosclerosis of aorta: Secondary | ICD-10-CM | POA: Diagnosis not present

## 2019-04-26 DIAGNOSIS — R413 Other amnesia: Secondary | ICD-10-CM

## 2019-04-26 DIAGNOSIS — F5101 Primary insomnia: Secondary | ICD-10-CM

## 2019-04-26 DIAGNOSIS — J449 Chronic obstructive pulmonary disease, unspecified: Secondary | ICD-10-CM

## 2019-04-26 MED ORDER — DONEPEZIL HCL 5 MG PO TABS: 5.0000 mg | ORAL_TABLET | Freq: Every day | ORAL | 5 refills | Status: DC

## 2019-04-26 MED ORDER — PROAIR HFA 108 (90 BASE) MCG/ACT IN AERS: INHALATION_SPRAY | RESPIRATORY_TRACT | 11 refills | Status: DC

## 2019-04-26 MED ORDER — TRELEGY ELLIPTA 100-62.5-25 MCG/INH IN AEPB: 1.0000 | INHALATION_SPRAY | Freq: Every day | RESPIRATORY_TRACT | 11 refills | Status: DC

## 2019-04-26 MED ORDER — LORAZEPAM 0.5 MG PO TABS: ORAL_TABLET | ORAL | 5 refills | Status: DC

## 2019-04-26 MED ORDER — AZITHROMYCIN 250 MG PO TABS: ORAL_TABLET | ORAL | 1 refills | Status: DC

## 2019-04-26 NOTE — Progress Notes (Signed)
Subjective:  Patient ID: Carlos Brooks, male    DOB: Mar 06, 1927, 86 y.o.   MRN: [id number]  Patient Care Team: [name], FNP as PCP - General (Family Medicine) [name], MD as Consulting Physician (Optometry) Hayden Pedro, MD as Consulting Physician (Ophthalmology) Jarome Matin, MD as Consulting Physician (Dermatology) Minus Breeding, MD as Consulting Physician (Cardiology)   Chief Complaint:  Medication Refill and Medical Management of Chronic Issues   HPI: Carlos Brooks is a 86 y.o. male presenting on 04/26/2019 for Medication Refill and Medical Management of Chronic Issues   1. Primary insomnia Pt has been on Ativan for several years for treatment of insomnia ans has tolerated this well. He states if he runs out of the medications he does not sleep at all despite trying over the counter medications.   2. COPD (chronic obstructive pulmonary disease) with chronic bronchitis (HCC) Pt has tried Symbicort, anoro, and advair without complete relief of shortness of breath and wheezing. He is using his ProAir as needed for cough and shortness of breath. He feels he needs something different for his COPD. He does report increased cough, shortness of breath, and sputum production over the last week. States he has not tried anything for the symptoms.   3. Thoracic aorta atherosclerosis ([place]) On ASA therapy. Intolerant to statins. No chest pain, palpitations, dizziness, or weakness.   4. Memory loss Pt reports ongoing fatigue and memory loss. States he is more forgetful of names and where he places things. He reports good long term memory but a difficult time with short term memory. He was unable to see neurology, rescheduled for March. Has not tried any medications in the past.      Relevant past medical, surgical, family, and social history reviewed and updated as indicated.  Allergies and medications reviewed and updated. Date reviewed: Chart in Epic.   Past  Medical History:  Diagnosis Date  . BPH (benign prostatic hyperplasia)   . Cancer ([place])    skin  . Cataract   . COPD (chronic obstructive pulmonary disease) ([place])   . Generalized headaches   . GERD (gastroesophageal reflux disease)   . Hiatal hernia   . Hyperlipidemia     Past Surgical History:  Procedure Laterality Date  . EYE SURGERY    . HERNIA REPAIR     x 3, inguinal     Social History   Socioeconomic History  . Marital status: Married    Spouse name: Not on file  . Number of children: 5  . Years of education: Not on file  . Highest education level: Not on file  Occupational History  . Not on file  Tobacco Use  . Smoking status: Former Smoker    Quit date: 04/13/1993    Years since quitting: 26.0  . Smokeless tobacco: Never Used  Substance and Sexual Activity  . Alcohol use: No  . Drug use: No  . Sexual activity: Never  Other Topics Concern  . Not on file  Social History Narrative  . Not on file   Social Determinants of Health   Financial Resource Strain:   . Difficulty of Paying Living Expenses: Not on file  Food Insecurity:   . Worried About Charity fundraiser in the Last Year: Not on file  . Ran Out of Food in the Last Year: Not on file  Transportation Needs:   . Lack of Transportation (Medical): Not on file  . Lack of  Transportation (Non-Medical): Not on file  Physical Activity:   . Days of Exercise per Week: Not on file  . Minutes of Exercise per Session: Not on file  Stress:   . Feeling of Stress : Not on file  Social Connections:   . Frequency of Communication with Friends and Family: Not on file  . Frequency of Social Gatherings with Friends and Family: Not on file  . Attends Religious Services: Not on file  . Active Member of Clubs or Organizations: Not on file  . Attends Archivist Meetings: Not on file  . Marital Status: Not on file  Intimate Partner Violence:   . Fear of Current or Ex-Partner: Not on file  . Emotionally  Abused: Not on file  . Physically Abused: Not on file  . Sexually Abused: Not on file    Outpatient Encounter Medications as of 04/26/2019  Medication Sig  . aspirin 81 MG EC tablet Take 81 mg by mouth daily.    . Evolocumab (REPATHA SURECLICK) 696 MG/ML SOAJ Inject 140 mg into the skin every 14 (fourteen) days.  . finasteride (PROSCAR) 5 MG tablet Take 1 tablet (5 mg total) by mouth daily.  Marland Kitchen guaiFENesin (MUCINEX) 600 MG 12 hr tablet Take 1,200 mg by mouth 2 (two) times daily.  Marland Kitchen LORazepam (ATIVAN) 0.5 MG tablet Take nightly as needed for sleep  . PROAIR HFA 108 (90 Base) MCG/ACT inhaler 2 PUFFS EVERY 6 HOURS AS NEEDED FOR WHEEZING OR SHORTNESS OF BREATH  . terazosin (HYTRIN) 5 MG capsule TAKE (1) CAPSULE DAILY  . Vitamin D, Ergocalciferol, (DRISDOL) 1.25 MG (50000 UT) CAPS capsule TAKE 1 CAPSULE ONCE A WEEK  . [DISCONTINUED] LORazepam (ATIVAN) 0.5 MG tablet Take nightly as needed for sleep  . [DISCONTINUED] PROAIR HFA 108 (90 Base) MCG/ACT inhaler 2 PUFFS EVERY 6 HOURS AS NEEDED FOR WHEEZING OR SHORTNESS OF BREATH  . donepezil (ARICEPT) 5 MG tablet Take 1 tablet (5 mg total) by mouth at bedtime.  . Fluticasone-Umeclidin-Vilant (TRELEGY ELLIPTA) 100-62.5-25 MCG/INH AEPB Inhale 1 puff into the lungs daily.  . [DISCONTINUED] pantoprazole (PROTONIX) 40 MG tablet Take 1 tablet (40 mg total) by mouth daily.   No facility-administered encounter medications on file as of 04/26/2019.    Allergies  Allergen Reactions  . Clarithromycin   . Crestor [Rosuvastatin Calcium]   . Lipitor [Atorvastatin Calcium]   . Niaspan [Niacin Er]   . Pneumovax [Pneumococcal Polysaccharide Vaccine]     Arm swelling and rash  . Pravachol   . Ranitidine Hcl   . Simvastatin     Review of Systems  Constitutional: Negative for activity change, appetite change, chills, diaphoresis, fatigue, fever and unexpected weight change.  HENT: Negative.   Eyes: Negative.  Negative for photophobia and visual disturbance.    Respiratory: Positive for cough, shortness of breath and wheezing. Negative for chest tightness.   Cardiovascular: Negative for chest pain, palpitations and leg swelling.  Gastrointestinal: Negative for blood in stool, constipation, diarrhea, nausea and vomiting.  Endocrine: Negative.  Negative for cold intolerance, heat intolerance, polydipsia, polyphagia and polyuria.  Genitourinary: Negative for decreased urine volume, difficulty urinating, dysuria, frequency and urgency.  Musculoskeletal: Positive for gait problem. Negative for arthralgias and myalgias.  Skin: Negative.   Allergic/Immunologic: Negative.   Neurological: Positive for tremors. Negative for dizziness, seizures, syncope, facial asymmetry, speech difficulty, weakness, light-headedness, numbness and headaches.  Hematological: Negative.   Psychiatric/Behavioral: Positive for confusion. Negative for agitation, behavioral problems, decreased concentration, dysphoric mood, hallucinations, self-injury, sleep  disturbance and suicidal ideas. The patient is not nervous/anxious and is not hyperactive.   All other systems reviewed and are negative.       Objective:  BP 133/65   Pulse 63   Temp 97.7 F (36.5 C)   Resp 20   Ht 5' 6"  (1.676 m)   Wt 141 lb (64 kg)   SpO2 98%   BMI 22.76 kg/m    Wt Readings from Last 3 Encounters:  04/26/19 141 lb (64 kg)  04/05/19 141 lb (64 kg)  02/16/19 138 lb (62.6 kg)    Physical Exam Vitals and nursing note reviewed.  Constitutional:      General: He is not in acute distress.    Appearance: Normal appearance. He is well-developed, well-groomed and normal weight. He is not ill-appearing, toxic-appearing or diaphoretic.  HENT:     Head: Normocephalic and atraumatic.     Jaw: There is normal jaw occlusion.     Right Ear: Hearing normal.     Left Ear: Hearing normal.     Nose: Nose normal.     Mouth/Throat:     Lips: Pink.     Mouth: Mucous membranes are moist.     Pharynx:  Oropharynx is clear. Uvula midline.  Eyes:     General: Lids are normal.     Extraocular Movements: Extraocular movements intact.     Conjunctiva/sclera: Conjunctivae normal.     Pupils: Pupils are equal, round, and reactive to light.  Neck:     Thyroid: No thyroid mass, thyromegaly or thyroid tenderness.     Vascular: No carotid bruit or JVD.     Trachea: Trachea and phonation normal.  Cardiovascular:     Rate and Rhythm: Normal rate and regular rhythm.     Chest Wall: PMI is not displaced.     Pulses: Normal pulses.     Heart sounds: Normal heart sounds. No murmur. No friction rub. No gallop.   Pulmonary:     Effort: Pulmonary effort is normal. No respiratory distress.     Breath sounds: Wheezing present.  Abdominal:     General: Bowel sounds are normal. There is no distension or abdominal bruit.     Palpations: Abdomen is soft. There is no hepatomegaly or splenomegaly.     Tenderness: There is no abdominal tenderness. There is no right CVA tenderness or left CVA tenderness.     Hernia: No hernia is present.  Musculoskeletal:        General: Normal range of motion.     Cervical back: Normal range of motion and neck supple.     Right lower leg: No edema.     Left lower leg: No edema.  Lymphadenopathy:     Cervical: No cervical adenopathy.  Skin:    General: Skin is warm and dry.     Capillary Refill: Capillary refill takes less than 2 seconds.     Coloration: Skin is not cyanotic, jaundiced or pale.     Findings: No rash.  Neurological:     General: No focal deficit present.     Mental Status: He is alert and oriented to person, place, and time.     Cranial Nerves: Cranial nerves are intact. No cranial nerve deficit.     Sensory: Sensation is intact. No sensory deficit.     Motor: Tremor (rigid) present. No weakness.     Coordination: Coordination is intact. Coordination normal.     Gait: Gait abnormal (slowed).  Deep Tendon Reflexes: Reflexes are normal and symmetric.  Reflexes normal.  Psychiatric:        Attention and Perception: Attention and perception normal.        Mood and Affect: Mood and affect normal.        Speech: Speech normal.        Behavior: Behavior normal. Behavior is cooperative.        Thought Content: Thought content normal.        Cognition and Memory: Cognition normal. Memory is impaired. He exhibits impaired recent memory.        Judgment: Judgment normal.     Results for orders placed or performed in visit on 02/16/19  CMP14+EGFR  Result Value Ref Range   Glucose 91 65 - 99 mg/dL   BUN 17 10 - 36 mg/dL   Creatinine, Ser 1.15 0.76 - 1.27 mg/dL   GFR calc non Af Amer 56 (L) >59 mL/min/1.73   GFR calc Af Amer 64 >59 mL/min/1.73   BUN/Creatinine Ratio 15 10 - 24   Sodium 139 134 - 144 mmol/L   Potassium 4.4 3.5 - 5.2 mmol/L   Chloride 100 96 - 106 mmol/L   CO2 25 20 - 29 mmol/L   Calcium 9.8 8.6 - 10.2 mg/dL   Total Protein 6.8 6.0 - 8.5 g/dL   Albumin 4.6 3.5 - 4.6 g/dL   Globulin, Total 2.2 1.5 - 4.5 g/dL   Albumin/Globulin Ratio 2.1 1.2 - 2.2   Bilirubin Total 0.3 0.0 - 1.2 mg/dL   Alkaline Phosphatase 51 39 - 117 IU/L   AST 24 0 - 40 IU/L   ALT 16 0 - 44 IU/L  Lipid panel  Result Value Ref Range   Cholesterol, Total 210 (H) 100 - 199 mg/dL   Triglycerides 175 (H) 0 - 149 mg/dL   HDL 52 >39 mg/dL   VLDL Cholesterol Cal 31 5 - 40 mg/dL   LDL Chol Calc (NIH) 127 (H) 0 - 99 mg/dL   Chol/HDL Ratio 4.0 0.0 - 5.0 ratio  CBC with Differential/Platelet  Result Value Ref Range   WBC 8.2 3.4 - 10.8 x10E3/uL   RBC 4.43 4.14 - 5.80 x10E6/uL   Hemoglobin 14.0 13.0 - 17.7 g/dL   Hematocrit 41.2 37.5 - 51.0 %   MCV 93 79 - 97 fL   MCH 31.6 26.6 - 33.0 pg   MCHC 34.0 31.5 - 35.7 g/dL   RDW 13.1 11.6 - 15.4 %   Platelets 177 150 - 450 x10E3/uL   Neutrophils 47 Not Estab. %   Lymphs 27 Not Estab. %   Monocytes 10 Not Estab. %   Eos 14 Not Estab. %   Basos 1 Not Estab. %   Neutrophils Absolute 3.9 1.4 - 7.0 x10E3/uL    Lymphocytes Absolute 2.2 0.7 - 3.1 x10E3/uL   Monocytes Absolute 0.8 0.1 - 0.9 x10E3/uL   EOS (ABSOLUTE) 1.1 (H) 0.0 - 0.4 x10E3/uL   Basophils Absolute 0.1 0.0 - 0.2 x10E3/uL   Immature Granulocytes 1 Not Estab. %   Immature Grans (Abs) 0.1 0.0 - 0.1 x10E3/uL  Thyroid Panel With TSH  Result Value Ref Range   TSH 1.580 0.450 - 4.500 uIU/mL   T4, Total 7.3 4.5 - 12.0 ug/dL   T3 Uptake Ratio 25 24 - 39 %   Free Thyroxine Index 1.8 1.2 - 4.9       Pertinent labs & imaging results that were available during my care of the patient were  reviewed by me and considered in my medical decision making.  Assessment & Plan:  Atha was seen today for medication refill and medical management of chronic issues.  Diagnoses and all orders for this visit:  Primary insomnia Doing well on below, will continue.  -     LORazepam (ATIVAN) 0.5 MG tablet; Take nightly as needed for sleep  COPD (chronic obstructive pulmonary disease) with chronic bronchitis (HCC) Previous inhalers were not helpful per pts report. Will trial Trelegy. 3 samples provided with proper instructions on use. Refilled ProAir today. Pt provided with Z Pack due to worsening SHOB, cough, and sputum production. Pt aware to report any new or worsening symptoms.  -     PROAIR HFA 108 (90 Base) MCG/ACT inhaler; 2 PUFFS EVERY 6 HOURS AS NEEDED FOR WHEEZING OR SHORTNESS OF BREATH -     Fluticasone-Umeclidin-Vilant (TRELEGY ELLIPTA) 100-62.5-25 MCG/INH AEPB; Inhale 1 puff into the lungs daily.  Thoracic aorta atherosclerosis (HCC) Continue ASA therapy. Diet and exercise encouraged.   Memory loss Has upcoming appointment with neurology but pt feels symptoms are worsening. Will trail below at bedtime. Pt aware to report any new or worsening symptoms. Medications as prescribed. Follow up as scheduled.  -     donepezil (ARICEPT) 5 MG tablet; Take 1 tablet (5 mg total) by mouth at bedtime.     Continue all other maintenance  medications.  Follow up plan: Return in about 6 months (around 10/24/2019), or if symptoms worsen or fail to improve.  Continue healthy lifestyle choices, including diet (rich in fruits, vegetables, and lean proteins, and low in salt and simple carbohydrates) and exercise (at least 30 minutes of moderate physical activity daily).  Educational handout given for survey, COVID-19 education  The above assessment and management plan was discussed with the patient. The patient verbalized understanding of and has agreed to the management plan. Patient is aware to call the clinic if they develop any new symptoms or if symptoms persist or worsen. Patient is aware when to return to the clinic for a follow-up visit. Patient educated on when it is appropriate to go to the emergency department.   Monia Pouch, FNP-C [facility] [phone]

## 2019-04-26 NOTE — Patient Instructions (Signed)
It was a pleasure seeing you today, Carlos Brooks.  Information regarding what we discussed is included in this packet.  Please make an appointment to see me in 6 months.    If you had labs performed today, you will be contacted with the abnormal results once they are available, usually in the next 3 business days for routine lab work.  If you had STI testing, a pap smear, or a biopsy performed, expect to be contacted in about 7-10 days. If results are normal, you will not be notified.    In a few days you may receive a survey in the mail or online from [employer] regarding your visit with [place] today. Please take a moment to fill this out. Your feedback is very important to our office. It can help [place] better understand your needs as well as improve your experience and satisfaction. Thank you for taking your time to complete it. We care about you.  Because of recent events of COVID-19 ("Coronavirus"), please follow CDC recommendations:   1. Wash your hand frequently 2. Avoid touching your face 3. Stay away from people who are sick 4. If you have symptoms such as fever, cough, shortness of breath then call your healthcare provider for further guidance 5. If you are sick, STAY AT HOME, unless otherwise directed by your healthcare provider. 6. Follow directions from state and national officials regarding staying safe    Please feel free to call our office if any questions or concerns arise.  Warm Regards, Carlos Pouch, FNP-C [facility] [address] [place], [place] [address] [phone]

## 2019-05-10 DIAGNOSIS — G4733 Obstructive sleep apnea (adult) (pediatric): Secondary | ICD-10-CM | POA: Diagnosis not present

## 2019-05-23 ENCOUNTER — Encounter: Payer: Self-pay | Admitting: *Deleted

## 2019-05-23 ENCOUNTER — Other Ambulatory Visit: Payer: Self-pay | Admitting: Family Medicine

## 2019-05-23 ENCOUNTER — Ambulatory Visit (INDEPENDENT_AMBULATORY_CARE_PROVIDER_SITE_OTHER): Payer: PPO | Admitting: *Deleted

## 2019-05-23 DIAGNOSIS — Z0000 Encounter for general adult medical examination without abnormal findings: Secondary | ICD-10-CM | POA: Diagnosis not present

## 2019-05-23 NOTE — Patient Instructions (Addendum)
  Carlos Brooks , Thank you for taking time for talking to me today for your Medicare Wellness Visit. I appreciate your ongoing commitment to your health goals. Please review the following plan we discussed and let me know if I can assist you in the future.   These are the goals we discussed: Goals    . AWV     05/23/2019 AWV Goal: Exercise for General Health   Patient will verbalize understanding of the benefits of increased physical activity:  Exercising regularly is important. It will improve your overall fitness, flexibility, and endurance.  Regular exercise also will improve your overall health. It can help you control your weight, reduce stress, and improve your bone density.  Over the next year, patient will increase physical activity as tolerated with a goal of at least 150 minutes of moderate physical activity per week.   You can tell that you are exercising at a moderate intensity if your heart starts beating faster and you start breathing faster but can still hold a conversation.  Moderate-intensity exercise ideas include:  Walking 1 mile (1.6 km) in about 15 minutes  Biking  Hiking  Golfing  Dancing  Water aerobics  Patient will verbalize understanding of everyday activities that increase physical activity by providing examples like the following: ? Yard work, such as: ? Pushing a Conservation officer, nature ? Raking and bagging leaves ? Washing your car ? Pushing a stroller ? Shoveling snow ? Gardening ? Washing windows or floors  Patient will be able to explain general safety guidelines for exercising:   Before you start a new exercise program, talk with your health care provider.  Do not exercise so much that you hurt yourself, feel dizzy, or get very short of breath.  Wear comfortable clothes and wear shoes with good support.  Drink plenty of water while you exercise to prevent dehydration or heat stroke.  Work out until your breathing and your heartbeat get  faster. 05/23/2019 AWV Goal: Fall Prevention  . Over the next year, patient will decrease their risk for falls by: o Using assistive devices, such as a cane or walker, as needed o Identifying fall risks within their home and correcting them by: - Removing throw rugs - Adding handrails to stairs or ramps - Removing clutter and keeping a clear pathway throughout the home - Increasing light, especially at night - Adding shower handles/bars - Raising toilet seat o Identifying potential personal risk factors for falls: - Medication side effects - Incontinence/urgency - Vestibular dysfunction - Hearing loss - Musculoskeletal disorders - Neurological disorders - Orthostatic hypotension      . DIET - EAT MORE FRUITS AND VEGETABLES    . Follow up with Primary Care Provider       This is a list of the screening recommended for you and due dates:  Health Maintenance  Topic Date Due  . Tetanus Vaccine  11/06/2024  . Flu Shot  Completed  . Pneumonia vaccines  Discontinued  .Marland Kitchen

## 2019-05-23 NOTE — Progress Notes (Signed)
MEDICARE ANNUAL WELLNESS VISIT  05/23/2019  Telephone Visit Disclaimer This Medicare AWV was conducted by telephone due to national recommendations for restrictions regarding the COVID-19 Pandemic (e.g. social distancing).  I verified, using two identifiers, that I am speaking with Carlos Brooks or their authorized healthcare agent. I discussed the limitations, risks, security, and privacy concerns of performing an evaluation and management service by telephone and the potential availability of an in-person appointment in the future. The patient expressed understanding and agreed to proceed.   Subjective:  Carlos Brooks is a 86 y.o. male patient of [name], [name], [place] who had a Medicare Annual Wellness Visit today via telephone. Carlos Brooks and lives with his wife and they have been married for 26 years. Hehas 5 children (one who passed away with a heart attack). He reports that he is socially active and does interact with friends/family regularly. He is minimally physically active and enjoys working in the yard and spending time with his wife.  Patient Care Team: [name], FNP as PCP - General (Family Medicine) [name], MD as Consulting Physician (Optometry) Hayden Pedro, MD as Consulting Physician (Ophthalmology) Jarome Matin, MD as Consulting Physician (Dermatology) Minus Breeding, MD as Consulting Physician (Cardiology)  Advanced Directives 05/23/2019 06/03/2018 01/05/2018 10/13/2017 10/24/2015 10/19/2014  Does Patient Have a Medical Advance Directive? Yes Yes No Yes No No  Type of Advance Directive Living will [place];Living will - [place];Living will - -  Does patient want to make changes to medical advance directive? No - Patient declined No - Patient declined - No - Patient declined - -  Copy of [place] in Chart? - No - copy requested - No - copy requested - -  Would patient like  information on creating a medical advance directive? - - No - Patient declined - No - patient declined information Yes - Educational materials given    Hospital Utilization Over the Past 12 Months: # of hospitalizations or ER visits: 0 # of surgeries: 0  Review of Systems    Patient reports that his overall health is unchanged compared to last year.  History obtained from chart review and the patient  Patient Reported Readings (BP, Pulse, CBG, Weight, etc) none  Pain Assessment Pain : No/denies pain     Current Medications & Allergies (verified) Allergies as of 05/23/2019      Reactions   Clarithromycin    Crestor [rosuvastatin Calcium]    Lipitor [atorvastatin Calcium]    Niaspan [niacin Er]    Pneumovax [pneumococcal Polysaccharide Vaccine]    Arm swelling and rash   Pravachol    Ranitidine Hcl    Simvastatin       Medication List       Accurate as of May 23, 2019 10:24 AM. If you have any questions, ask your nurse or doctor.        STOP taking these medications   azithromycin 250 MG tablet Commonly known as: Zithromax Z-Pak     TAKE these medications   aspirin 81 MG EC tablet Take 81 mg by mouth daily.   donepezil 5 MG tablet Commonly known as: Aricept Take 1 tablet (5 mg total) by mouth at bedtime.   finasteride 5 MG tablet Commonly known as: PROSCAR Take 1 tablet (5 mg total) by mouth daily.   guaiFENesin 600 MG 12 hr tablet Commonly known as: MUCINEX Take 1,200 mg by mouth  2 (two) times daily.   LORazepam 0.5 MG tablet Commonly known as: ATIVAN Take nightly as needed for sleep   ProAir HFA 108 (90 Base) MCG/ACT inhaler Generic drug: albuterol 2 PUFFS EVERY 6 HOURS AS NEEDED FOR WHEEZING OR SHORTNESS OF BREATH   Repatha SureClick XX123456 MG/ML Soaj Generic drug: Evolocumab Inject 140 mg into the skin every 14 (fourteen) days.   terazosin 5 MG capsule Commonly known as: HYTRIN TAKE (1) CAPSULE DAILY   Trelegy Ellipta 100-62.5-25 MCG/INH  Aepb Generic drug: Fluticasone-Umeclidin-Vilant Inhale 1 puff into the lungs daily.   Vitamin D (Ergocalciferol) 1.25 MG (50000 UNIT) Caps capsule Commonly known as: DRISDOL TAKE 1 CAPSULE ONCE A WEEK       History (reviewed): Past Medical History:  Diagnosis Date  . BPH (benign prostatic hyperplasia)   . Cancer ([place])    skin  . Cataract   . COPD (chronic obstructive pulmonary disease) ([place])   . Generalized headaches   . GERD (gastroesophageal reflux disease)   . Hiatal hernia   . Hyperlipidemia    Past Surgical History:  Procedure Laterality Date  . EYE SURGERY    . HERNIA REPAIR     x 3, inguinal    Family History  Problem Relation Age of Onset  . Cancer Mother        Carlos Brooks  . Cancer Father        lung  . Heart attack Brother 45       Not clear details  . Heart attack Son 103       Died with an MI  . Heart disease Son   . Cancer Brother        liver  . Heart attack Brother 21  . Aneurysm Brother   . Hypertension Son   . Obesity Son   . Asthma Son    Social History   Socioeconomic History  . Marital status: Married    Spouse name: Not on file  . Number of children: 5  . Years of education: Not on file  . Highest education level: 8th grade  Occupational History  . Occupation: Brooks  Tobacco Use  . Smoking status: Former Smoker    Quit date: 04/13/1993    Years since quitting: 26.1  . Smokeless tobacco: Never Used  Substance and Sexual Activity  . Alcohol use: No  . Drug use: No  . Sexual activity: Not Currently  Other Topics Concern  . Not on file  Social History Narrative  . Not on file   Social Determinants of Health   Financial Resource Strain:   . Difficulty of Paying Living Expenses:   Food Insecurity:   . Worried About Charity fundraiser in the Last Year:   . Arboriculturist in the Last Year:   Transportation Needs:   . Film/video editor (Medical):   Marland Kitchen Lack of Transportation (Non-Medical):   Physical Activity:   . Days  of Exercise per Week:   . Minutes of Exercise per Session:   Stress:   . Feeling of Stress :   Social Connections:   . Frequency of Communication with Friends and Family:   . Frequency of Social Gatherings with Friends and Family:   . Attends Religious Services:   . Active Member of Clubs or Organizations:   . Attends Archivist Meetings:   Marland Kitchen Marital Status:     Activities of Daily Living In your present state of health, do you have any  difficulty performing the following activities: 05/23/2019  Hearing? N  Vision? N  Comment Wears glasses  Difficulty concentrating or making decisions? N  Walking or climbing stairs? N  Dressing or bathing? N  Doing errands, shopping? N  Preparing Food and eating ? N  Using the Toilet? N  In the past six months, have you accidently leaked urine? N  Do you have problems with loss of bowel control? N  Managing your Medications? N  Managing your Finances? N  Some recent data might be hidden    Patient Education/ Literacy How often do you need to have someone help you when you read instructions, pamphlets, or other written materials from your doctor or pharmacy?: 1 - Never What is the last grade level you completed in school?: 8th  Exercise Current Exercise Habits: The patient does not participate in regular exercise at present, Exercise limited by: respiratory conditions(s)  Diet Patient reports consuming 3 meals a day and 1 snack(s) a day Patient reports that his primary diet is: Regular Patient reports that she does have regular access to food.   Depression Screen PHQ 2/9 Scores 04/26/2019 02/16/2019 10/17/2018 06/03/2018 03/18/2018 01/05/2018 10/13/2017  PHQ - 2 Score 0 0 0 2 0 1 0  PHQ- 9 Score - - - 2 - - -     Fall Risk Fall Risk  04/26/2019 02/16/2019 02/16/2019 10/17/2018 03/18/2018  Falls in the past year? 0 0 0 0 0     Objective:  Carlos Brooks seemed alert and oriented and he participated appropriately during our  telephone visit.  Brooks Pressure Weight BMI  BP Readings from Last 3 Encounters:  04/26/19 133/65  04/05/19 126/62  02/16/19 140/78   Wt Readings from Last 3 Encounters:  04/26/19 141 lb (64 kg)  04/05/19 141 lb (64 kg)  02/16/19 138 lb (62.6 kg)   BMI Readings from Last 1 Encounters:  04/26/19 22.76 kg/m    *Unable to obtain current vital signs, weight, and BMI due to telephone visit type  Hearing/Vision  . Carlos Brooks did not seem to have difficulty with hearing/understanding during the telephone conversation . Reports that he has had a formal eye exam by an eye care professional within the past year . Reports that he has not had a formal hearing evaluation within the past year *Unable to fully assess hearing and vision during telephone visit type  Cognitive Function: 6CIT Screen 05/23/2019  What Year? 4 points  What month? 0 points  What time? 0 points  Count Brooks from 20 0 points  Months in reverse 4 points  Repeat phrase 0 points  Total Score 8   (Normal:0-7, Significant for Dysfunction: >8)  Normal Cognitive Function Screening: Yes   Immunization & Health Maintenance Record Immunization History  Administered Date(s) Administered  . Fluad Quad(high Dose 65+) 11/25/2018  . Influenza Split 12/08/2010, 12/08/2011  . Influenza Whole 12/26/2008  . Influenza, High Dose Seasonal PF 12/13/2015, 12/15/2016, 01/12/2018  . Influenza,inj,Quad PF,6+ Mos 12/26/2012, 12/21/2014  . Influenza,inj,quad, With Preservative 12/05/2013  . Pneumococcal Polysaccharide-23 03/09/1994  . Td 03/09/2005, 11/07/2014  . Zoster 01/08/2007    Health Maintenance  Topic Date Due  . Carlos Brooks  11/06/2024  . INFLUENZA VACCINE  Completed  . PNA vac Low Risk Adult  Discontinued       Assessment  This is a routine wellness examination for Carlos Brooks.  Health Maintenance: Due or Overdue There are no preventive care reminders to display for this patient.  Carlos Brooks does not  need a referral for [employer] Assistance: Care Management:   no Social Work:    no Prescription Assistance:  no Nutrition/Diabetes Education:  no   Plan:  Personalized Goals Goals Addressed   None    Personalized Health Maintenance & Screening Recommendations  Patient is up to date  Lung Cancer Screening Recommended: no (Low Dose CT Chest recommended if Age 10-80 years, 30 pack-year currently smoking OR have quit w/in past 15 years) Hepatitis C Screening recommended: no HIV Screening recommended: no  Advanced Directives: Written information was not prepared per patient's request.  Referrals & Orders No orders of the defined types were placed in this encounter.   Follow-up Plan . Follow-up with [name], FNP as planned    I have personally reviewed and noted the following in the patient's chart:   . Medical and social history . Use of alcohol, tobacco or illicit drugs  . Current medications and supplements . Functional ability and status . Nutritional status . Physical activity . Advanced directives . List of other physicians . Hospitalizations, surgeries, and ER visits in previous 12 months . Vitals . Screenings to include cognitive, depression, and falls . Referrals and appointments  In addition, I have reviewed and discussed with Carlos Brooks certain preventive protocols, quality metrics, and best practice recommendations. A written personalized care plan for preventive services as well as general preventive health recommendations is available and can be mailed to the patient at his request.      Lynnea Ferrier, LPN  [id number]

## 2019-05-25 ENCOUNTER — Telehealth: Payer: Self-pay | Admitting: Family Medicine

## 2019-05-25 NOTE — Chronic Care Management (AMB) (Signed)
  Chronic Care Management   Outreach Note  05/25/2019 Name: Carlos Brooks MRN: [id number] DOB: 08/17/1926  Carlos Brooks is a 86 y.o. year old male who is a primary care patient of [name], [name], FNP. I reached out to Carlos Brooks by phone today in response to a referral sent by Carlos Brooks [facility] health plan.     An unsuccessful telephone outreach was attempted today. The patient was referred to the case management team for assistance with care management and care coordination.   Follow Up Plan: A HIPPA compliant phone message was left for the patient providing contact information and requesting a return call.  The care management team will reach out to the patient again over the next 7 days.  If patient returns call to provider office, please advise to call [place] at [phone].  [place], [place] [address] Direct Dial: [phone] [email or web address]

## 2019-05-29 NOTE — Chronic Care Management (AMB) (Signed)
  Chronic Care Management   Outreach Note  05/29/2019 Name: TIERRE HELLYER MRN: [id number] DOB: August 09, 1926  DEMETRIES ARNAU is a 86 y.o. year old male who is a primary care patient of [name], [name], FNP. I reached out to [name] by phone today in response to a referral sent by Mr. Shakel [facility] health plan.     A second unsuccessful telephone outreach was attempted today. The patient was referred to the case management team for assistance with care management and care coordination.   Follow Up Plan: A HIPPA compliant phone message was left for the patient providing contact information and requesting a return call.  The care management team will reach out to the patient again over the next 7 days.  If patient returns call to provider office, please advise to call [place] at [phone].  [place], [place] [address] Direct Dial: [phone] [email or web address] Website: [email or web address]

## 2019-05-31 NOTE — Chronic Care Management (AMB) (Signed)
  Chronic Care Management   Outreach Note  05/31/2019 Name: Carlos Brooks MRN: [id number] DOB: March 14, 1926  Carlos Brooks is a 86 y.o. year old male who is a primary care patient of [name], [name], FNP. I reached out to [name] by phone today in response to a referral sent by Mr. [facility] health plan.     Third unsuccessful telephone outreach was attempted today. The patient was referred to the case management team for assistance with care management and care coordination. The patient's primary care provider has been notified of our unsuccessful attempts to make or maintain contact with the patient. The care management team is pleased to engage with this patient at any time in the future should he/she be interested in assistance from the care management team.   Follow Up Plan: A HIPPA compliant phone message was left for the patient providing contact information and requesting a return call. If patient returns call to provider office, please advise to call [place] at [phone].  [place], [place] [address] Direct Dial: [phone] [email or web address] Website: [email or web address]

## 2019-06-13 DIAGNOSIS — L570 Actinic keratosis: Secondary | ICD-10-CM | POA: Diagnosis not present

## 2019-06-13 DIAGNOSIS — Z85828 Personal history of other malignant neoplasm of skin: Secondary | ICD-10-CM | POA: Diagnosis not present

## 2019-06-13 DIAGNOSIS — D485 Neoplasm of uncertain behavior of skin: Secondary | ICD-10-CM | POA: Diagnosis not present

## 2019-06-15 ENCOUNTER — Ambulatory Visit (INDEPENDENT_AMBULATORY_CARE_PROVIDER_SITE_OTHER): Payer: PPO | Admitting: Family Medicine

## 2019-06-15 ENCOUNTER — Encounter: Payer: Self-pay | Admitting: Family Medicine

## 2019-06-15 ENCOUNTER — Other Ambulatory Visit: Payer: Self-pay

## 2019-06-15 VITALS — BP 122/66 | HR 77 | Temp 99.6°F | Ht 66.0 in | Wt 137.6 lb

## 2019-06-15 DIAGNOSIS — I700 Atherosclerosis of aorta: Secondary | ICD-10-CM | POA: Diagnosis not present

## 2019-06-15 DIAGNOSIS — G252 Other specified forms of tremor: Secondary | ICD-10-CM | POA: Diagnosis not present

## 2019-06-15 DIAGNOSIS — J301 Allergic rhinitis due to pollen: Secondary | ICD-10-CM | POA: Diagnosis not present

## 2019-06-15 DIAGNOSIS — E559 Vitamin D deficiency, unspecified: Secondary | ICD-10-CM | POA: Diagnosis not present

## 2019-06-15 DIAGNOSIS — J449 Chronic obstructive pulmonary disease, unspecified: Secondary | ICD-10-CM

## 2019-06-15 DIAGNOSIS — E785 Hyperlipidemia, unspecified: Secondary | ICD-10-CM

## 2019-06-15 DIAGNOSIS — R413 Other amnesia: Secondary | ICD-10-CM | POA: Diagnosis not present

## 2019-06-15 DIAGNOSIS — K219 Gastro-esophageal reflux disease without esophagitis: Secondary | ICD-10-CM | POA: Diagnosis not present

## 2019-06-15 MED ORDER — LEVOCETIRIZINE DIHYDROCHLORIDE 5 MG PO TABS: 2.5000 mg | ORAL_TABLET | Freq: Every evening | ORAL | 1 refills | Status: DC

## 2019-06-15 MED ORDER — MEMANTINE HCL 5 MG PO TABS: 5.0000 mg | ORAL_TABLET | Freq: Two times a day (BID) | ORAL | 1 refills | Status: DC

## 2019-06-15 MED ORDER — FLUTICASONE PROPIONATE 50 MCG/ACT NA SUSP: 2.0000 | Freq: Every day | NASAL | 6 refills | Status: DC

## 2019-06-15 NOTE — Progress Notes (Signed)
Subjective:  Patient ID: Carlos Brooks, male    DOB: 1926/07/15, 86 y.o.   MRN: [id number]  Patient Care Team: [name], [place] as PCP - General (Family Medicine) [name], MD as Consulting Physician (Optometry) Hayden Pedro, MD as Consulting Physician (Ophthalmology) Jarome Matin, MD as Consulting Physician (Dermatology) Minus Breeding, MD as Consulting Physician (Cardiology)   Chief Complaint:  Medical Management of Chronic Issues and Hyperlipidemia   HPI: Carlos Brooks is a 86 y.o. male presenting on 06/15/2019 for Medical Management of Chronic Issues and Hyperlipidemia   1. Hyperlipidemia LDL goal <100 On Repatha and tolerating well. This is managed by cardiology. He reports a generally healthy diet. Walks daily.   2. Memory loss Pt reports worsening symptoms. States he does not know what he is doing at times and feels more forgetful. States he will be talking and does not know what he is talking about. Wife reports he is getting better since starting the [place]. Pt seems more confused in office today. Pt seems slightly agitated when trying to explain his CPAP machine. Referral was placed to neurology. Pt has yet to follow up.   3. Gastroesophageal reflux disease without esophagitis On PPI daily and tolerating well. No hemoptysis, melena, or hematochezia. No voice change. Does have a cough at times. He has followed with GI for dysphagia.   4. Aortic atherosclerosis (HCC) On Repatha and ASA therapy. No chest pain or abdominal pain.   5. Vitamin D deficiency On repletion therapy daily and tolerating well. No arthralgias or myalgias.   6. COPD (chronic obstructive pulmonary disease) with chronic bronchitis (HCC) Doing well on current medications. No new or worsening cough, SHOB, or sputum production.   7. Tremor, coarse Ongoing tremor of upper extremity. Referral was placed to neurology. Pt has yet to follow up.    Relevant past medical,  surgical, family, and social history reviewed and updated as indicated.  Allergies and medications reviewed and updated. Date reviewed: Chart in Epic.   Past Medical History:  Diagnosis Date  . BPH (benign prostatic hyperplasia)   . Cancer ([place])    skin  . Cataract   . COPD (chronic obstructive pulmonary disease) ([place])   . Generalized headaches   . GERD (gastroesophageal reflux disease)   . Hiatal hernia   . Hyperlipidemia     Past Surgical History:  Procedure Laterality Date  . EYE SURGERY    . HERNIA REPAIR     x 3, inguinal   . skin cancer removal     head    Social History   Socioeconomic History  . Marital status: Married    Spouse name: Not on file  . Number of children: 5  . Years of education: Not on file  . Highest education level: 8th grade  Occupational History  . Occupation: Retired  Tobacco Use  . Smoking status: Former Smoker    Quit date: 04/13/1993    Years since quitting: 26.1  . Smokeless tobacco: Never Used  Substance and Sexual Activity  . Alcohol use: No  . Drug use: No  . Sexual activity: Not Currently  Other Topics Concern  . Not on file  Social History Narrative  . Not on file   Social Determinants of Health   Financial Resource Strain:   . Difficulty of Paying Living Expenses:   Food Insecurity:   . Worried About Charity fundraiser in the Last Year:   . [employer]  of Food in the Last Year:   Transportation Needs:   . Film/video editor (Medical):   Marland Kitchen Lack of Transportation (Non-Medical):   Physical Activity:   . Days of Exercise per Week:   . Minutes of Exercise per Session:   Stress:   . Feeling of Stress :   Social Connections:   . Frequency of Communication with Friends and Family:   . Frequency of Social Gatherings with Friends and Family:   . Attends Religious Services:   . Active Member of Clubs or Organizations:   . Attends Archivist Meetings:   Marland Kitchen Marital Status:   Intimate Partner Violence:   . Fear  of Current or Ex-Partner:   . Emotionally Abused:   Marland Kitchen Physically Abused:   . Sexually Abused:     Outpatient Encounter Medications as of 06/15/2019  Medication Sig  . aspirin 81 MG EC tablet Take 81 mg by mouth daily.    . Evolocumab (REPATHA SURECLICK) 790 MG/ML SOAJ Inject 140 mg into the skin every 14 (fourteen) days.  . finasteride (PROSCAR) 5 MG tablet Take 1 tablet (5 mg total) by mouth daily.  . Fluticasone-Umeclidin-Vilant (TRELEGY ELLIPTA) 100-62.5-25 MCG/INH AEPB Inhale 1 puff into the lungs daily.  Marland Kitchen guaiFENesin (MUCINEX) 600 MG 12 hr tablet Take 1,200 mg by mouth 2 (two) times daily.  Marland Kitchen LORazepam (ATIVAN) 0.5 MG tablet Take nightly as needed for sleep  . pantoprazole (PROTONIX) 40 MG tablet Take 1 tablet (40 mg total) by mouth daily.  Marland Kitchen PROAIR HFA 108 (90 Base) MCG/ACT inhaler 2 PUFFS EVERY 6 HOURS AS NEEDED FOR WHEEZING OR SHORTNESS OF BREATH  . terazosin (HYTRIN) 5 MG capsule TAKE (1) CAPSULE DAILY  . Vitamin D, Ergocalciferol, (DRISDOL) 1.25 MG (50000 UT) CAPS capsule TAKE 1 CAPSULE ONCE A WEEK  . donepezil (ARICEPT) 5 MG tablet Take 1 tablet (5 mg total) by mouth at bedtime.  . fluticasone (FLONASE) 50 MCG/ACT nasal spray Place 2 sprays into both nostrils daily.  Marland Kitchen levocetirizine (XYZAL) 5 MG tablet Take 0.5 tablets (2.5 mg total) by mouth every evening.  . memantine (NAMENDA) 5 MG tablet Take 1 tablet (5 mg total) by mouth 2 (two) times daily.   No facility-administered encounter medications on file as of 06/15/2019.    Allergies  Allergen Reactions  . Clarithromycin   . Crestor [Rosuvastatin Calcium]   . Lipitor [Atorvastatin Calcium]   . Niaspan [Niacin Er]   . Pneumovax [Pneumococcal Polysaccharide Vaccine]     Arm swelling and rash  . Pravachol   . Ranitidine Hcl   . Simvastatin     Review of Systems  Constitutional: Positive for activity change and fatigue. Negative for appetite change, chills, diaphoresis, fever and unexpected weight change.  HENT: Positive  for congestion, rhinorrhea, sneezing and trouble swallowing. Negative for postnasal drip, sinus pressure, sinus pain, sore throat, tinnitus and voice change.   Eyes: Negative.  Negative for photophobia and visual disturbance.  Respiratory: Positive for choking. Negative for cough, chest tightness and shortness of breath.   Cardiovascular: Negative for chest pain, palpitations and leg swelling.  Gastrointestinal: Negative for abdominal pain, blood in stool, constipation, diarrhea, nausea and vomiting.  Endocrine: Negative.  Negative for cold intolerance, heat intolerance, polydipsia, polyphagia and polyuria.  Genitourinary: Negative for decreased urine volume, difficulty urinating, dysuria, frequency and urgency.  Musculoskeletal: Positive for gait problem. Negative for arthralgias and myalgias.  Skin: Negative.   Allergic/Immunologic: Negative.   Neurological: Positive for tremors and speech difficulty.  Negative for dizziness, seizures, syncope, facial asymmetry, weakness, light-headedness, numbness and headaches.  Hematological: Negative.   Psychiatric/Behavioral: Positive for confusion. Negative for hallucinations, sleep disturbance and suicidal ideas.  All other systems reviewed and are negative.       Objective:  BP 122/66   Pulse 77   Temp 99.6 F (37.6 C)   Ht 5' 6" (1.676 m)   Wt 137 lb 9.6 oz (62.4 kg)   SpO2 98%   BMI 22.21 kg/m    Wt Readings from Last 3 Encounters:  06/15/19 137 lb 9.6 oz (62.4 kg)  04/26/19 141 lb (64 kg)  04/05/19 141 lb (64 kg)    Physical Exam Vitals and nursing note reviewed.  Constitutional:      General: He is not in acute distress.    Appearance: Normal appearance. He is well-developed, well-groomed and normal weight. He is not ill-appearing, toxic-appearing or diaphoretic.  HENT:     Head: Normocephalic and atraumatic.     Jaw: There is normal jaw occlusion.     Right Ear: Hearing normal.     Left Ear: Hearing normal.     Nose: Nose  normal.     Mouth/Throat:     Lips: Pink.     Mouth: Mucous membranes are moist.     Pharynx: Oropharynx is clear. Uvula midline.  Eyes:     General: Lids are normal.     Extraocular Movements: Extraocular movements intact.     Conjunctiva/sclera: Conjunctivae normal.     Pupils: Pupils are equal, round, and reactive to light.  Neck:     Thyroid: No thyroid mass, thyromegaly or thyroid tenderness.     Vascular: No carotid bruit or JVD.     Trachea: Trachea and phonation normal.  Cardiovascular:     Rate and Rhythm: Normal rate and regular rhythm.     Chest Wall: PMI is not displaced.     Pulses: Normal pulses.     Heart sounds: Normal heart sounds. No murmur. No friction rub. No gallop.   Pulmonary:     Effort: Pulmonary effort is normal. No respiratory distress.     Breath sounds: Normal breath sounds. No wheezing.  Abdominal:     General: Bowel sounds are normal. There is no distension or abdominal bruit.     Palpations: Abdomen is soft. There is no hepatomegaly or splenomegaly.     Tenderness: There is no abdominal tenderness. There is no right CVA tenderness or left CVA tenderness.     Hernia: No hernia is present.  Musculoskeletal:        General: Normal range of motion.     Cervical back: Normal range of motion and neck supple.     Right lower leg: No edema.     Left lower leg: No edema.  Lymphadenopathy:     Cervical: No cervical adenopathy.  Skin:    General: Skin is warm and dry.     Capillary Refill: Capillary refill takes less than 2 seconds.     Coloration: Skin is not cyanotic, jaundiced or pale.     Findings: No rash.  Neurological:     General: No focal deficit present.     Mental Status: He is alert and oriented to person, place, and time.     Cranial Nerves: Cranial nerves are intact.     Sensory: Sensation is intact.     Motor: Tremor (coarse, upper extremities) present.     Coordination: Coordination is intact.  Gait: Gait abnormal (slowed).      Deep Tendon Reflexes: Reflexes are normal and symmetric.  Psychiatric:        Attention and Perception: Attention and perception normal.        Mood and Affect: Mood and affect normal.        Speech: Speech is delayed.        Behavior: Behavior normal. Behavior is cooperative.        Thought Content: Thought content normal.        Cognition and Memory: Cognition normal. Memory is impaired. He exhibits impaired recent memory and impaired remote memory.        Judgment: Judgment normal.     Results for orders placed or performed in visit on 02/16/19  CMP14+EGFR  Result Value Ref Range   Glucose 91 65 - 99 mg/dL   BUN 17 10 - 36 mg/dL   Creatinine, Ser 1.15 0.76 - 1.27 mg/dL   GFR calc non Af Amer 56 (L) >59 mL/min/1.73   GFR calc Af Amer 64 >59 mL/min/1.73   BUN/Creatinine Ratio 15 10 - 24   Sodium 139 134 - 144 mmol/L   Potassium 4.4 3.5 - 5.2 mmol/L   Chloride 100 96 - 106 mmol/L   CO2 25 20 - 29 mmol/L   Calcium 9.8 8.6 - 10.2 mg/dL   Total Protein 6.8 6.0 - 8.5 g/dL   Albumin 4.6 3.5 - 4.6 g/dL   Globulin, Total 2.2 1.5 - 4.5 g/dL   Albumin/Globulin Ratio 2.1 1.2 - 2.2   Bilirubin Total 0.3 0.0 - 1.2 mg/dL   Alkaline Phosphatase 51 39 - 117 IU/L   AST 24 0 - 40 IU/L   ALT 16 0 - 44 IU/L  Lipid panel  Result Value Ref Range   Cholesterol, Total 210 (H) 100 - 199 mg/dL   Triglycerides 175 (H) 0 - 149 mg/dL   HDL 52 >39 mg/dL   VLDL Cholesterol Cal 31 5 - 40 mg/dL   LDL Chol Calc (NIH) 127 (H) 0 - 99 mg/dL   Chol/HDL Ratio 4.0 0.0 - 5.0 ratio  CBC with Differential/Platelet  Result Value Ref Range   WBC 8.2 3.4 - 10.8 x10E3/uL   RBC 4.43 4.14 - 5.80 x10E6/uL   Hemoglobin 14.0 13.0 - 17.7 g/dL   Hematocrit 41.2 37.5 - 51.0 %   MCV 93 79 - 97 fL   MCH 31.6 26.6 - 33.0 pg   MCHC 34.0 31.5 - 35.7 g/dL   RDW 13.1 11.6 - 15.4 %   Platelets 177 150 - 450 x10E3/uL   Neutrophils 47 Not Estab. %   Lymphs 27 Not Estab. %   Monocytes 10 Not Estab. %   Eos 14 Not Estab. %    Basos 1 Not Estab. %   Neutrophils Absolute 3.9 1.4 - 7.0 x10E3/uL   Lymphocytes Absolute 2.2 0.7 - 3.1 x10E3/uL   Monocytes Absolute 0.8 0.1 - 0.9 x10E3/uL   EOS (ABSOLUTE) 1.1 (H) 0.0 - 0.4 x10E3/uL   Basophils Absolute 0.1 0.0 - 0.2 x10E3/uL   Immature Granulocytes 1 Not Estab. %   Immature Grans (Abs) 0.1 0.0 - 0.1 x10E3/uL  Thyroid Panel With TSH  Result Value Ref Range   TSH 1.580 0.450 - 4.500 uIU/mL   T4, Total 7.3 4.5 - 12.0 ug/dL   T3 Uptake Ratio 25 24 - 39 %   Free Thyroxine Index 1.8 1.2 - 4.9       Pertinent labs & imaging  results that were available during my care of the patient were reviewed by me and considered in my medical decision making.  Assessment & Plan:  Carlos Brooks was seen today for medical management of chronic issues and hyperlipidemia.  Diagnoses and all orders for this visit:  Hyperlipidemia LDL goal <100 Diet encouraged - increase intake of fresh fruits and vegetables, increase intake of lean proteins. Bake, broil, or grill foods. Avoid fried, greasy, and fatty foods. Avoid fast foods. Increase intake of fiber-rich whole grains. Exercise encouraged - at least 150 minutes per week and advance as tolerated.  Goal BMI < 25. Continue medications as prescribed. Follow up in 3-6 months as discussed.  -     Lipid panel -     CMP14+EGFR  Memory loss Tremor, coarse Ongoing and worsening symptoms. Will trial below. New referral to neurology placed.  -     Ambulatory referral to Neurology -     memantine (NAMENDA) 5 MG tablet; Take 1 tablet (5 mg total) by mouth 2 (two) times daily.  Gastroesophageal reflux disease without esophagitis No red flags present. Diet discussed. Avoid fried, spicy, fatty, greasy, and acidic foods. Avoid caffeine, nicotine, and alcohol. Do not eat 2-3 hours before bedtime and stay upright for at least 1-2 hours after eating. Eat small frequent meals. Avoid NSAID's like motrin and aleve. Medications as prescribed. Report any new or  worsening symptoms. Follow up as discussed or sooner if needed.   -     CBC with Differential/Platelet  Aortic atherosclerosis (HCC) Continue ASA and statin therapy.  -     Lipid panel -     CBC with Differential/Platelet -     CMP14+EGFR  Vitamin D deficiency Labs pending. Continue repletion therapy. If indicated, will change repletion dosage. Eat foods rich in Vit D including milk, orange juice, yogurt with vitamin D added, salmon or mackerel, canned tuna fish, cereals with vitamin D added, and cod liver oil. Get out in the sun but make sure to wear at least SPF 30 sunscreen.  -     VITAMIN D 25 Hydroxy (Vit-D Deficiency, Fractures)  COPD (chronic obstructive pulmonary disease) with chronic bronchitis (HCC) Doing well on current regimen, will continue.   Seasonal allergic rhinitis due to pollen Ongoing and worsening symptoms. Will trial below. Pt aware to report any new, worsening, or persistent symptoms.  -     fluticasone (FLONASE) 50 MCG/ACT nasal spray; Place 2 sprays into both nostrils daily. -     levocetirizine (XYZAL) 5 MG tablet; Take 0.5 tablets (2.5 mg total) by mouth every evening.     Continue all other maintenance medications.  Follow up plan: Return if symptoms worsen or fail to improve.   Continue healthy lifestyle choices, including diet (rich in fruits, vegetables, and lean proteins, and low in salt and simple carbohydrates) and exercise (at least 30 minutes of moderate physical activity daily).   The above assessment and management plan was discussed with the patient. The patient verbalized understanding of and has agreed to the management plan. Patient is aware to call the clinic if they develop any new symptoms or if symptoms persist or worsen. Patient is aware when to return to the clinic for a follow-up visit. Patient educated on when it is appropriate to go to the emergency department.   Monia Pouch, FNP-C [facility] [phone]

## 2019-06-16 ENCOUNTER — Other Ambulatory Visit: Payer: Self-pay | Admitting: Family Medicine

## 2019-06-16 LAB — LIPID PANEL
Chol/HDL Ratio: 2.4 ratio (ref 0.0–5.0)
Cholesterol, Total: 136 mg/dL (ref 100–199)
HDL: 56 mg/dL (ref >39)
LDL Chol Calc (NIH): 49 mg/dL (ref 0–99)
Triglycerides: 193 mg/dL — ABNORMAL HIGH (ref 0–149)
VLDL Cholesterol Cal: 31 mg/dL (ref 5–40)

## 2019-06-16 LAB — CMP14+EGFR
ALT: 16 IU/L (ref 0–44)
AST: 23 IU/L (ref 0–40)
Albumin/Globulin Ratio: 1.9 (ref 1.2–2.2)
Albumin: 4.4 g/dL (ref 3.5–4.6)
Alkaline Phosphatase: 48 IU/L (ref 39–117)
BUN/Creatinine Ratio: 14 (ref 10–24)
BUN: 16 mg/dL (ref 10–36)
Bilirubin Total: 0.4 mg/dL (ref 0.0–1.2)
CO2: 23 mmol/L (ref 20–29)
Calcium: 9.9 mg/dL (ref 8.6–10.2)
Chloride: 105 mmol/L (ref 96–106)
Creatinine, Ser: 1.18 mg/dL (ref 0.76–1.27)
GFR calc Af Amer: 62 mL/min/{1.73_m2} (ref >59)
GFR calc non Af Amer: 54 mL/min/{1.73_m2} — ABNORMAL LOW (ref >59)
Globulin, Total: 2.3 g/dL (ref 1.5–4.5)
Glucose: 85 mg/dL (ref 65–99)
Potassium: 4.4 mmol/L (ref 3.5–5.2)
Sodium: 143 mmol/L (ref 134–144)
Total Protein: 6.7 g/dL (ref 6.0–8.5)

## 2019-06-16 LAB — CBC WITH DIFFERENTIAL/PLATELET
Basophils Absolute: 0.1 10*3/uL (ref 0.0–0.2)
Basos: 1 %
EOS (ABSOLUTE): 0.9 10*3/uL — ABNORMAL HIGH (ref 0.0–0.4)
Eos: 10 %
Hematocrit: 43 % (ref 37.5–51.0)
Hemoglobin: 14.5 g/dL (ref 13.0–17.7)
Immature Grans (Abs): 0 10*3/uL (ref 0.0–0.1)
Immature Granulocytes: 1 %
Lymphocytes Absolute: 2.3 10*3/uL (ref 0.7–3.1)
Lymphs: 27 %
MCH: 30.8 pg (ref 26.6–33.0)
MCHC: 33.7 g/dL (ref 31.5–35.7)
MCV: 91 fL (ref 79–97)
Monocytes Absolute: 0.9 10*3/uL (ref 0.1–0.9)
Monocytes: 10 %
Neutrophils Absolute: 4.3 10*3/uL (ref 1.4–7.0)
Neutrophils: 51 %
Platelets: 161 10*3/uL (ref 150–450)
RBC: 4.71 x10E6/uL (ref 4.14–5.80)
RDW: 13.3 % (ref 11.6–15.4)
WBC: 8.5 10*3/uL (ref 3.4–10.8)

## 2019-06-16 LAB — VITAMIN D 25 HYDROXY (VIT D DEFICIENCY, FRACTURES): Vit D, 25-Hydroxy: 48.8 ng/mL (ref 30.0–100.0)

## 2019-06-16 NOTE — Progress Notes (Signed)
Triglycerides remain elevated. Continue to follow a low fat, heart healthy diet and take medications as prescribed.  CBC normal, Vit D normal.  Renal function is slightly declined. Make sure you are staying hydrated. Glucose and liver function normal.

## 2019-06-21 ENCOUNTER — Ambulatory Visit: Payer: PPO | Admitting: Family Medicine

## 2019-06-27 ENCOUNTER — Encounter (INDEPENDENT_AMBULATORY_CARE_PROVIDER_SITE_OTHER): Payer: PPO | Admitting: Ophthalmology

## 2019-06-27 ENCOUNTER — Other Ambulatory Visit: Payer: Self-pay

## 2019-06-27 DIAGNOSIS — H26491 Other secondary cataract, right eye: Secondary | ICD-10-CM

## 2019-07-11 ENCOUNTER — Encounter: Payer: Self-pay | Admitting: Neurology

## 2019-07-11 ENCOUNTER — Telehealth: Payer: Self-pay | Admitting: Neurology

## 2019-07-11 ENCOUNTER — Other Ambulatory Visit: Payer: Self-pay

## 2019-07-11 ENCOUNTER — Ambulatory Visit: Payer: PPO | Admitting: Neurology

## 2019-07-11 VITALS — BP 154/73 | HR 61 | Temp 97.2°F | Ht 65.0 in | Wt 139.6 lb

## 2019-07-11 DIAGNOSIS — G301 Alzheimer's disease with late onset: Secondary | ICD-10-CM | POA: Diagnosis not present

## 2019-07-11 DIAGNOSIS — F0280 Dementia in other diseases classified elsewhere without behavioral disturbance: Secondary | ICD-10-CM | POA: Diagnosis not present

## 2019-07-11 DIAGNOSIS — R413 Other amnesia: Secondary | ICD-10-CM

## 2019-07-11 MED ORDER — MEMANTINE HCL 5 MG PO TABS: 10.0000 mg | ORAL_TABLET | Freq: Two times a day (BID) | ORAL | 1 refills | Status: DC

## 2019-07-11 NOTE — Telephone Encounter (Signed)
health team order sent to GI. no auth they will reach out to the patient to schedule.

## 2019-07-11 NOTE — Patient Instructions (Signed)
I had a long discussion with the patient and his daughter regarding his memory loss and dementia which has shown some improvement on partial doses of Aricept and Namenda hence recommend increasing Namenda dose to 10 mg twice daily gradually over the next 1 month if tolerated.  Continue Aricept 5 mg daily due to relative high risk for side effects if increased.  We also discussed memory compensation strategies and encouraged him to increase participation in cognitively challenging activities like solving crossword puzzles, playing bridge and sudoku.  Check memory panel labs, EEG and MRI scan.  He will return for follow-up in the future in 2 months or call earlier if necessary. Memory Compensation Strategies  1. Use "WARM" strategy.  W= write it down  A= associate it  R= repeat it  M= make a mental note  2.   You can keep a Social worker.  Use a 3-ring notebook with sections for the following: calendar, important names and phone numbers,  medications, doctors' names/phone numbers, lists/reminders, and a section to journal what you did  each day.   3.    Use a calendar to write appointments down.  4.    Write yourself a schedule for the day.  This can be placed on the calendar or in a separate section of the Memory Notebook.  Keeping a  regular schedule can help memory.  5.    Use medication organizer with sections for each day or morning/evening pills.  You may need help loading it  6.    Keep a basket, or pegboard by the door.  Place items that you need to take out with you in the basket or on the pegboard.  You may also want to  include a message board for reminders.  7.    Use sticky notes.  Place sticky notes with reminders in a place where the task is performed.  For example: " turn off the  stove" placed by the stove, "lock the door" placed on the door at eye level, " take your medications" on  the bathroom mirror or by the place where you normally take your medications.  8.    Use  alarms/timers.  Use while cooking to remind yourself to check on food or as a reminder to take your medicine, or as a  reminder to make a call, or as a reminder to perform another task, etc.  Alzheimer Disease Caregiver Guide  Alzheimer disease causes a person to lose the ability to remember things and make decisions. A person who has Alzheimer disease may not be able to take care of himself or herself. He or she may need help with simple tasks. The tips below can help you care for the person. What kind of changes does this condition cause? This condition makes a person:  Forget things.  Feel confused.  Act differently.  Have different moods. These things get worse with time. Tips to help with symptoms  Be calm and patient.  Respond with a simple, short answer.  Avoid correcting the person in a negative way.  Try not to take things personally, even if the person forgets your name.  Do not argue with the person. This may make the person more upset. Tips to lessen frustration  Make appointments and do daily tasks when the person is at his or her best.  Take your time. Simple tasks may take longer. Allow plenty of time to complete tasks.  Limit choices for the person.  Involve the person  in what you are doing.  Keep a daily routine.  Avoid new or crowded places, if possible.  Use simple words, short sentences, and a calm voice. Only give one direction at a time.  Buy clothes and shoes that are easy to put on and take off.  Organize medicines in a pillbox for each day of the week.  Keep a calendar in a central location to remind the person of meetings or other activities.  Let people help if they offer. Take a break when needed. Tips to prevent injury  Keep floors clear. Remove rugs, magazine racks, and floor lamps.  Keep hallways well-lit.  Put a handrail and non-slip mat in the bathtub or shower.  Put childproof locks on cabinets that have dangerous items in  them. These items include medicine, alcohol, guns, toxic cleaning items, sharp tools, matches, and lighters.  Put locks on doors where the person cannot see or reach them. This helps the person to not wander out of the house and get lost.  Be prepared for emergencies. Keep a list of emergency phone numbers and addresses close by.  Bracelets may be worn that track location and identify the person as having memory problems. This should be worn at all times for safety. Tips for the future  Discuss financial and legal planning early. People with this disease have trouble managing their money as the disease gets worse. Get help from a professional.  Talk about advance directives, safety, and daily care. Take these steps: ? Create a living will and choose a power of attorney. This is someone who can make decisions for the person with Alzheimer disease when he or she can no longer do so. ? Discuss driving safety and when to stop driving. The person's doctor can help with this. ? If the person lives alone, make sure he or she is safe. Some people need extra help at home. Other people need more care at a nursing home or care center. Where to find support You can find support by joining a support group near you. Some benefits of joining a support group include:  Learning ways to manage stress.  Sharing experiences with others.  Getting emotional comfort and support.  Learning about caregiving as the disease progresses.  Knowing what community resources are available and making use of them. Where to find more information  Alzheimer's Association: [employer] Contact a doctor if:  The person has a fever.  The person has a sudden behavior change that does not get better with calming strategies.  The person is not able to take care of himself or herself at home.  The person threatens you or anyone else, including himself or herself.  You are no longer able to care for the  person. Summary  Alzheimer disease causes a person to forget things and to be confused.  A person who has this condition may not be able to take care of himself or herself.  Take steps to keep the person from getting hurt. Plan for future care.  You can find support by joining a support group near you. This information is not intended to replace advice given to you by your health care provider. Make sure you discuss any questions you have with your health care provider. Document Revised: 06/14/2018 Document Reviewed: 02/18/2017 Elsevier Patient Education  2020 [employer].

## 2019-07-11 NOTE — Progress Notes (Signed)
[facility] [address] [place]. [place] [address] [phone]       OFFICE CONSULT NOTE  [name] Date of Birth:  1926-03-26 Medical Record Number:  [id number]   Referring MD: Claretta Fraise Reason for Referral: Dementia HPI: Carlos Brooks is a 86 year old pleasant Caucasian male seen today for initial office consultation visit for memory loss.  He is accompanied by his daughter.  His history is obtained from them, review of referral notes and electronic medical records and I have reviewed available imaging films in PACS.  Patient has had progressive memory loss and cognitive worsening at least for the last 6 months.  He blames this on undergoing a swallowing test in which he underwent upper GI endoscopy for dysphagia.  Patient has mostly short-term memory is difficulties and trouble making new memories and remembering recent events.  His remote memory seems quite intact.  At times he has some trouble speaking and swallowing but this not progressive.  His daughter denies any delusions, hallucinations or violent behavior.  He does get agitated and frustrated a bit prickly when he cannot remember.  He is still independent mostly in actives of daily living.  Is living at home with his wife.  He is not doing his finances anymore and not driving but otherwise is doing mostly everything else.  He is not exhibited any unsafe behaviors.  He has been started by his primary physician Aricept 5 mg daily and Namenda 5 mg twice daily which is tolerating both without side effects.  The daughter felt there will was initial slight improvement after starting the medicines but that seems to have plateaued.  He denies any headaches, gait or balance difficulties, history of stroke, TIA, seizures, significant head injury with loss of consciousness.  There is no family history of dementia.  Patient has not had any recent brain imaging studies done, EEG or lab work for reversible causes of  memory impairment.  ROS:   14 system review of systems is positive for memory loss, decreased attention, decreased activity, tiredness, decreased hearing and all other systems negative  PMH:  Past Medical History:  Diagnosis Date  . BPH (benign prostatic hyperplasia)   . Cancer ([place])    skin  . Cataract   . COPD (chronic obstructive pulmonary disease) ([place])   . Generalized headaches   . GERD (gastroesophageal reflux disease)   . Hiatal hernia   . Hyperlipidemia     Social History:  Social History   Socioeconomic History  . Marital status: Married    Spouse name: Not on file  . Number of children: 5  . Years of education: Not on file  . Highest education level: 8th grade  Occupational History  . Occupation: Retired  Tobacco Use  . Smoking status: Former Smoker    Quit date: 04/13/1993    Years since quitting: 26.2  . Smokeless tobacco: Never Used  Substance and Sexual Activity  . Alcohol use: No  . Drug use: No  . Sexual activity: Not Currently  Other Topics Concern  . Not on file  Social History Narrative  . Not on file   Social Determinants of Health   Financial Resource Strain:   . Difficulty of Paying Living Expenses:   Food Insecurity:   . Worried About Charity fundraiser in the Last Year:   . Arboriculturist in the Last Year:   Transportation Needs:   . Film/video editor (Medical):   Marland Kitchen Lack  of Transportation (Non-Medical):   Physical Activity:   . Days of Exercise per Week:   . Minutes of Exercise per Session:   Stress:   . Feeling of Stress :   Social Connections:   . Frequency of Communication with Friends and Family:   . Frequency of Social Gatherings with Friends and Family:   . Attends Religious Services:   . Active Member of Clubs or Organizations:   . Attends Archivist Meetings:   Marland Kitchen Marital Status:   Intimate Partner Violence:   . Fear of Current or Ex-Partner:   . Emotionally Abused:   Marland Kitchen Physically Abused:   . Sexually  Abused:     Medications:   Current Outpatient Medications on File Prior to Visit  Medication Sig Dispense Refill  . aspirin 81 MG EC tablet Take 81 mg by mouth daily.      Marland Kitchen donepezil (ARICEPT) 5 MG tablet Take 1 tablet (5 mg total) by mouth at bedtime. 30 tablet 5  . Evolocumab (REPATHA SURECLICK) XX123456 MG/ML SOAJ Inject 140 mg into the skin every 14 (fourteen) days. 2 pen 11  . finasteride (PROSCAR) 5 MG tablet Take 1 tablet (5 mg total) by mouth daily. 90 tablet 0  . fluticasone (FLONASE) 50 MCG/ACT nasal spray Place 2 sprays into both nostrils daily. 16 g 6  . Fluticasone-Umeclidin-Vilant (TRELEGY ELLIPTA) 100-62.5-25 MCG/INH AEPB Inhale 1 puff into the lungs daily. 28 each 11  . guaiFENesin (MUCINEX) 600 MG 12 hr tablet Take 1,200 mg by mouth 2 (two) times daily.    Marland Kitchen levocetirizine (XYZAL) 5 MG tablet Take 0.5 tablets (2.5 mg total) by mouth every evening. 30 tablet 1  . LORazepam (ATIVAN) 0.5 MG tablet Take nightly as needed for sleep 30 tablet 5  . pantoprazole (PROTONIX) 40 MG tablet TAKE 1 TABLET EVERY DAY 30 tablet 5  . PROAIR HFA 108 (90 Base) MCG/ACT inhaler 2 PUFFS EVERY 6 HOURS AS NEEDED FOR WHEEZING OR SHORTNESS OF BREATH 8.5 g 11  . terazosin (HYTRIN) 5 MG capsule TAKE (1) CAPSULE DAILY 90 capsule 0   No current facility-administered medications on file prior to visit.    Allergies:   Allergies  Allergen Reactions  . Clarithromycin   . Crestor [Rosuvastatin Calcium]   . Lipitor [Atorvastatin Calcium]   . Niaspan [Niacin Er]   . Pneumovax [Pneumococcal Polysaccharide Vaccine]     Arm swelling and rash  . Pravachol   . Ranitidine Hcl   . Simvastatin     Physical Exam General: Frail elderly male, seated, in no evident distress Head: head normocephalic and atraumatic.   Neck: supple with no carotid or supraclavicular bruits Cardiovascular: regular rate and rhythm, no murmurs Musculoskeletal: Mild kyphoscoliosis  skin:  no rash/petichiae Vascular:  Normal pulses  all extremities  Neurologic Exam Mental Status: Awake and fully alert. Oriented to place and time. Recent and remote memory poor t. Attention span, concentration and fund of knowledge diminished. Mood and affect appropriate.  Mini-Mental status exam score 18/30 with deficits in orientation, recall and visual-spatial skills.  He was able to name only 8 animals which can walk on 4 legs.  Clock drawing score 1/4.  He was unable to copy intersecting pentagons.  Geriatric depression scale he scored 6 suggestive of mild depression Cranial Nerves: Fundoscopic exam reveals sharp disc margins. Pupils equal, briskly reactive to light. Extraocular movements full without nystagmus. Visual fields full to confrontation. Hearing diminished bilaterally. Facial sensation intact. Face, tongue, palate moves normally and  symmetrically.  Motor: Normal bulk and tone. Normal strength in all tested extremity muscles.  Mild action tremor bilateral upper extremities left greater than right.  No cogwheel rigidity. Sensory.: intact to touch , pinprick , position and vibratory sensation.  Coordination: Rapid alternating movements normal in all extremities. Finger-to-nose and heel-to-shin performed accurately bilaterally. Gait and Station: Arises from chair without difficulty. Stance is normal. Gait demonstrates normal stride length and balance . Able to heel, toe and tandem walk with mild difficulty.  Reflexes: 1+ and symmetric. Toes downgoing.       ASSESSMENT: 86 year old Caucasian male with subacute memory and cognitive worsening likely due to dementia of Alzheimer's type.  He has been started on suboptimal doses of Aricept and Namenda with possibly slight improvement.     PLAN: I had a long discussion with the patient and his daughter regarding his memory loss and dementia which has shown some improvement on partial doses of Aricept and Namenda hence recommend increasing Namenda dose to 10 mg twice daily gradually over  the next 1 month if tolerated.  Continue Aricept 5 mg daily due to relative high risk for side effects if increased.  We also discussed memory compensation strategies and encouraged him to increase participation in cognitively challenging activities like solving crossword puzzles, playing bridge and sudoku.  Check memory panel labs, EEG and MRI scan.  Greater than 50% time during this 45-minute consultation visit was spent on counseling and coordination of care about his memory loss and dementia and discussion of evaluation and treatment plan and answering questions.  He will return for follow-up in the future in 2 months or call earlier if necessary. Antony Contras, MD  [facility] [address] [place], [place] [address]  Phone [phone] Fax [phone] Note: This document was prepared with digital dictation and possible smart phrase technology. Any transcriptional errors that result from this process are unintentional.

## 2019-07-12 ENCOUNTER — Other Ambulatory Visit: Payer: Self-pay

## 2019-07-12 ENCOUNTER — Ambulatory Visit: Payer: PPO | Admitting: Neurology

## 2019-07-12 DIAGNOSIS — R413 Other amnesia: Secondary | ICD-10-CM

## 2019-07-12 DIAGNOSIS — R410 Disorientation, unspecified: Secondary | ICD-10-CM

## 2019-07-12 LAB — DEMENTIA PANEL
Homocysteine: 16.5 umol/L (ref 0.0–21.3)
RPR Ser Ql: NONREACTIVE
TSH: 2.16 u[IU]/mL (ref 0.450–4.500)
Vitamin B-12: 155 pg/mL — ABNORMAL LOW (ref 232–1245)

## 2019-07-14 ENCOUNTER — Encounter (INDEPENDENT_AMBULATORY_CARE_PROVIDER_SITE_OTHER): Payer: PPO | Admitting: Ophthalmology

## 2019-07-14 DIAGNOSIS — H2701 Aphakia, right eye: Secondary | ICD-10-CM

## 2019-07-14 NOTE — Progress Notes (Signed)
Kindly inform the patient that lab work for reversible causes of memory loss is significant for low vitamin B12 levels and he needs to see his primary care physician for treatment and correction of this.

## 2019-07-14 NOTE — Progress Notes (Signed)
Kindly inform the patient that EEG study was normal

## 2019-07-17 ENCOUNTER — Telehealth: Payer: Self-pay

## 2019-07-17 NOTE — Telephone Encounter (Signed)
Garvin Fila, MD  07/14/2019 1:52 PM EDT    Kindly inform the patient that EEG study was normal   Lvm asking pt's daughter ( ok per dpr) to call back so we could review result.

## 2019-07-18 ENCOUNTER — Other Ambulatory Visit: Payer: Self-pay | Admitting: *Deleted

## 2019-07-18 DIAGNOSIS — N400 Enlarged prostate without lower urinary tract symptoms: Secondary | ICD-10-CM

## 2019-07-18 MED ORDER — FINASTERIDE 5 MG PO TABS: 5.0000 mg | ORAL_TABLET | Freq: Every day | ORAL | 0 refills | Status: DC

## 2019-07-18 MED ORDER — TERAZOSIN HCL 5 MG PO CAPS: ORAL_CAPSULE | ORAL | 0 refills | Status: DC

## 2019-07-18 NOTE — Telephone Encounter (Signed)
I called pts daughter Arbie Cookey that EEG was normal. She verbalized understanding.

## 2019-07-18 NOTE — Telephone Encounter (Signed)
Pt daughter carol called back in regards to missed call

## 2019-07-19 ENCOUNTER — Telehealth: Payer: Self-pay | Admitting: Nurse Practitioner

## 2019-07-19 ENCOUNTER — Telehealth: Payer: Self-pay

## 2019-07-19 ENCOUNTER — Other Ambulatory Visit: Payer: Self-pay | Admitting: *Deleted

## 2019-07-19 DIAGNOSIS — E538 Deficiency of other specified B group vitamins: Secondary | ICD-10-CM

## 2019-07-19 MED ORDER — CYANOCOBALAMIN 1000 MCG/ML IJ SOLN
1000.0000 ug | INTRAMUSCULAR | Status: AC
  Administered 2019-07-20 – 2019-08-10 (×4): 1000 ug via INTRAMUSCULAR

## 2019-07-19 MED ORDER — CYANOCOBALAMIN 1000 MCG/ML IJ SOLN
1000.0000 ug | INTRAMUSCULAR | Status: AC
  Administered 2019-11-10 – 2021-10-22 (×24): 1000 ug via INTRAMUSCULAR

## 2019-07-19 MED ORDER — CYANOCOBALAMIN 1000 MCG/ML IJ SOLN
1000.0000 ug | INTRAMUSCULAR | Status: AC
  Administered 2019-08-24 – 2019-10-09 (×3): 1000 ug via INTRAMUSCULAR

## 2019-07-19 NOTE — Telephone Encounter (Signed)
I called the pt's daughter and advised of results. She verbalized understanding and will reach out to PCP's office to further discuss treatment.

## 2019-07-19 NOTE — Telephone Encounter (Signed)
Daughter aware MMM is off today she stated her and Dr. Willaim Rayas and his Neurology office has been talking and has a plan for him to start getting B12. Please put in b12 order. And send back to pools so we can get him scheduled.

## 2019-07-19 NOTE — Telephone Encounter (Signed)
Daughter aware.  Appt made for weekly injections and every other week injections.  B12 injections ordered per MMM

## 2019-07-19 NOTE — Telephone Encounter (Signed)
B12 weekly injection for 4 week then every other week for 4 week then monthly

## 2019-07-20 ENCOUNTER — Ambulatory Visit (INDEPENDENT_AMBULATORY_CARE_PROVIDER_SITE_OTHER): Payer: PPO | Admitting: *Deleted

## 2019-07-20 ENCOUNTER — Other Ambulatory Visit: Payer: Self-pay

## 2019-07-20 DIAGNOSIS — E538 Deficiency of other specified B group vitamins: Secondary | ICD-10-CM | POA: Diagnosis not present

## 2019-07-20 NOTE — Progress Notes (Signed)
Tolerated B12 shot well

## 2019-07-26 ENCOUNTER — Ambulatory Visit: Payer: PPO | Admitting: Neurology

## 2019-07-27 ENCOUNTER — Ambulatory Visit (INDEPENDENT_AMBULATORY_CARE_PROVIDER_SITE_OTHER): Payer: PPO | Admitting: Family Medicine

## 2019-07-27 ENCOUNTER — Other Ambulatory Visit: Payer: Self-pay

## 2019-07-27 DIAGNOSIS — E538 Deficiency of other specified B group vitamins: Secondary | ICD-10-CM | POA: Diagnosis not present

## 2019-07-30 ENCOUNTER — Ambulatory Visit
Admission: RE | Admit: 2019-07-30 | Discharge: 2019-07-30 | Disposition: A | Discharge status: Home / Self Care | Payer: PPO | Source: Ambulatory Visit | Attending: Neurology | Admitting: Neurology

## 2019-07-30 DIAGNOSIS — R413 Other amnesia: Secondary | ICD-10-CM

## 2019-07-30 MED ORDER — GADOBENATE DIMEGLUMINE 529 MG/ML IV SOLN
10.0000 mL | Freq: Once | INTRAVENOUS | Status: AC | PRN
  Administered 2019-07-30: 10 mL via INTRAVENOUS

## 2019-08-01 NOTE — Progress Notes (Signed)
Kindly inform the patient her MRI scan of the brain shows age-related changes of shrinkage of the brain and hardening of the arteries.  No new or worrisome findings.

## 2019-08-03 ENCOUNTER — Ambulatory Visit (INDEPENDENT_AMBULATORY_CARE_PROVIDER_SITE_OTHER): Payer: PPO | Admitting: *Deleted

## 2019-08-03 ENCOUNTER — Other Ambulatory Visit: Payer: Self-pay

## 2019-08-03 DIAGNOSIS — E538 Deficiency of other specified B group vitamins: Secondary | ICD-10-CM

## 2019-08-03 NOTE — Progress Notes (Signed)
Tolerated B12 inj well °

## 2019-08-10 ENCOUNTER — Other Ambulatory Visit: Payer: Self-pay

## 2019-08-10 ENCOUNTER — Ambulatory Visit (INDEPENDENT_AMBULATORY_CARE_PROVIDER_SITE_OTHER): Payer: PPO | Admitting: *Deleted

## 2019-08-10 DIAGNOSIS — E538 Deficiency of other specified B group vitamins: Secondary | ICD-10-CM

## 2019-08-10 NOTE — Progress Notes (Signed)
Tolerated B12 injection well

## 2019-08-24 ENCOUNTER — Other Ambulatory Visit: Payer: Self-pay

## 2019-08-24 ENCOUNTER — Ambulatory Visit (INDEPENDENT_AMBULATORY_CARE_PROVIDER_SITE_OTHER): Payer: PPO

## 2019-08-24 DIAGNOSIS — E538 Deficiency of other specified B group vitamins: Secondary | ICD-10-CM | POA: Diagnosis not present

## 2019-08-24 NOTE — Progress Notes (Signed)
Cyanocobalamin injection given to left deltoid.  Patient tolerated well. 

## 2019-08-26 DIAGNOSIS — H04123 Dry eye syndrome of bilateral lacrimal glands: Secondary | ICD-10-CM | POA: Diagnosis not present

## 2019-08-26 DIAGNOSIS — H40033 Anatomical narrow angle, bilateral: Secondary | ICD-10-CM | POA: Diagnosis not present

## 2019-09-07 ENCOUNTER — Ambulatory Visit (INDEPENDENT_AMBULATORY_CARE_PROVIDER_SITE_OTHER): Payer: PPO | Admitting: *Deleted

## 2019-09-07 ENCOUNTER — Other Ambulatory Visit: Payer: Self-pay

## 2019-09-07 DIAGNOSIS — E538 Deficiency of other specified B group vitamins: Secondary | ICD-10-CM

## 2019-09-18 ENCOUNTER — Ambulatory Visit: Payer: PPO | Admitting: Neurology

## 2019-09-18 ENCOUNTER — Encounter: Payer: Self-pay | Admitting: Neurology

## 2019-09-18 ENCOUNTER — Other Ambulatory Visit: Payer: Self-pay

## 2019-09-18 VITALS — BP 143/68 | HR 64 | Ht 66.0 in | Wt 141.0 lb

## 2019-09-18 DIAGNOSIS — F0280 Dementia in other diseases classified elsewhere without behavioral disturbance: Secondary | ICD-10-CM | POA: Diagnosis not present

## 2019-09-18 DIAGNOSIS — G301 Alzheimer's disease with late onset: Secondary | ICD-10-CM | POA: Diagnosis not present

## 2019-09-18 MED ORDER — MEMANTINE HCL 10 MG PO TABS
10.0000 mg | ORAL_TABLET | Freq: Two times a day (BID) | ORAL | 3 refills | Status: DC
Start: 2019-09-18 — End: 2019-10-16

## 2019-09-18 NOTE — Progress Notes (Signed)
[facility] [address] [place]. [place] [address] [phone]       OFFICE FOLLOW UP VISIT NOTE  [name] Date of Birth:  07-Sep-1926 Medical Record Number:  [id number]   Referring MD: Claretta Fraise Reason for Referral: Dementia HPI: Initial Visit 07/11/2019 :Carlos Brooks is a 86 year old pleasant Caucasian male seen today for initial office consultation visit for memory loss.  He is accompanied by his daughter.  His history is obtained from them, review of referral notes and electronic medical records and I have reviewed available imaging films in PACS.  Patient has had progressive memory loss and cognitive worsening at least for the last 6 months.  He blames this on undergoing a swallowing test in which he underwent upper GI endoscopy for dysphagia.  Patient has mostly short-term memory is difficulties and trouble making new memories and remembering recent events.  His remote memory seems quite intact.  At times he has some trouble speaking and swallowing but this not progressive.  His daughter denies any delusions, hallucinations or violent behavior.  He does get agitated and frustrated a bit prickly when he cannot remember.  He is still independent mostly in actives of daily living.  Is living at home with his wife.  He is not doing his finances anymore and not driving but otherwise is doing mostly everything else.  He is not exhibited any unsafe behaviors.  He has been started by his primary physician Aricept 5 mg daily and Namenda 5 mg twice daily which is tolerating both without side effects.  The daughter felt there will was initial slight improvement after starting the medicines but that seems to have plateaued.  He denies any headaches, gait or balance difficulties, history of stroke, TIA, seizures, significant head injury with loss of consciousness.  There is no family history of dementia.  Patient has not had any recent brain imaging studies done, EEG or lab  work for reversible causes of memory impairment. Update 09/18/2019 : He returns for follow-up after last visit 2 months ago.  Is accompanied by his daughter.  He had lab work for reversible causes of memory loss at last visit and TSH, RPR and homocystine were normal.  Vitamin B12 was low at 155.  Patient has since seen primary care physician who started B12 replacement and is currently getting monthly shots.  EEG on 07/12/2019 was normal.  MRI scan of the brain on 07/30/2019 showed moderate generalized atrophy and mild changes of small vessel disease.  There is incidental paranasal sinusitis changes as well.  Atrophy was progressed compared with previous scan 2013.  I had recommended increasing Namenda to 10 mg twice daily at last visit but due to filling the wrong old prescription from pharmacy patient is still on 5 mg twice daily. Patient is still living at home with his wife.  His daughter and daughter-in-law provide close supervision.  They get Meals on Wheels.  He is independent in his personal hygiene and ambulation.  He is not driving.  He has not had any significant delusions, hallucinations, unsafe behavior, agitation.  He remains on Aricept 5 mg daily and Namenda 5 mg twice daily both of which is tolerating well without side effects. 14 system review of systems is positive for memory loss, decreased attention, decreased activity, tiredness, decreased hearing and all other systems negative  PMH:  Past Medical History:  Diagnosis Date  . BPH (benign prostatic hyperplasia)   . Cancer ([place])    skin  .  Cataract   . COPD (chronic obstructive pulmonary disease) ([place])   . Generalized headaches   . GERD (gastroesophageal reflux disease)   . Hiatal hernia   . Hyperlipidemia     Social History:  Social History   Socioeconomic History  . Marital status: Married    Spouse name: Not on file  . Number of children: 5  . Years of education: Not on file  . Highest education level: 8th grade    Occupational History  . Occupation: Retired  Tobacco Use  . Smoking status: Former Smoker    Quit date: 04/13/1993    Years since quitting: 26.4  . Smokeless tobacco: Never Used  Vaping Use  . Vaping Use: Never used  Substance and Sexual Activity  . Alcohol use: No  . Drug use: No  . Sexual activity: Not Currently  Other Topics Concern  . Not on file  Social History Narrative  . Not on file   Social Determinants of Health   Financial Resource Strain:   . Difficulty of Paying Living Expenses:   Food Insecurity:   . Worried About Charity fundraiser in the Last Year:   . Arboriculturist in the Last Year:   Transportation Needs:   . Film/video editor (Medical):   Marland Kitchen Lack of Transportation (Non-Medical):   Physical Activity:   . Days of Exercise per Week:   . Minutes of Exercise per Session:   Stress:   . Feeling of Stress :   Social Connections:   . Frequency of Communication with Friends and Family:   . Frequency of Social Gatherings with Friends and Family:   . Attends Religious Services:   . Active Member of Clubs or Organizations:   . Attends Archivist Meetings:   Marland Kitchen Marital Status:   Intimate Partner Violence:   . Fear of Current or Ex-Partner:   . Emotionally Abused:   Marland Kitchen Physically Abused:   . Sexually Abused:     Medications:   Current Outpatient Medications on File Prior to Visit  Medication Sig Dispense Refill  . aspirin 81 MG EC tablet Take 81 mg by mouth daily.      Marland Kitchen donepezil (ARICEPT) 5 MG tablet Take 1 tablet (5 mg total) by mouth at bedtime. 30 tablet 5  . Evolocumab (REPATHA SURECLICK) 480 MG/ML SOAJ Inject 140 mg into the skin every 14 (fourteen) days. 2 pen 11  . finasteride (PROSCAR) 5 MG tablet Take 1 tablet (5 mg total) by mouth daily. 90 tablet 0  . fluticasone (FLONASE) 50 MCG/ACT nasal spray Place 2 sprays into both nostrils daily. 16 g 6  . Fluticasone-Umeclidin-Vilant (TRELEGY ELLIPTA) 100-62.5-25 MCG/INH AEPB Inhale 1 puff  into the lungs daily. 28 each 11  . guaiFENesin (MUCINEX) 600 MG 12 hr tablet Take 1,200 mg by mouth 2 (two) times daily.    Marland Kitchen levocetirizine (XYZAL) 5 MG tablet Take 0.5 tablets (2.5 mg total) by mouth every evening. 30 tablet 1  . LORazepam (ATIVAN) 0.5 MG tablet Take nightly as needed for sleep 30 tablet 5  . pantoprazole (PROTONIX) 40 MG tablet TAKE 1 TABLET EVERY DAY 30 tablet 5  . PROAIR HFA 108 (90 Base) MCG/ACT inhaler 2 PUFFS EVERY 6 HOURS AS NEEDED FOR WHEEZING OR SHORTNESS OF BREATH 8.5 g 11  . terazosin (HYTRIN) 5 MG capsule TAKE (1) CAPSULE DAILY 90 capsule 0   Current Facility-Administered Medications on File Prior to Visit  Medication Dose Route Frequency Provider Last  Rate Last Admin  . [START ON 11/05/2019] cyanocobalamin ((VITAMIN B-12)) injection 1,000 mcg  1,000 mcg Intramuscular Q30 days Hassell Done, Mary-Margaret, FNP      . cyanocobalamin ((VITAMIN B-12)) injection 1,000 mcg  1,000 mcg Intramuscular Q14 Days [name], FNP   1,000 mcg at 09/07/19 1740    Allergies:   Allergies  Allergen Reactions  . Clarithromycin   . Crestor [Rosuvastatin Calcium]   . Lipitor [Atorvastatin Calcium]   . Niaspan [Niacin Er]   . Pneumovax [Pneumococcal Polysaccharide Vaccine]     Arm swelling and rash  . Pravachol   . Ranitidine Hcl   . Simvastatin     Physical Exam General: Frail elderly male, seated, in no evident distress Head: head normocephalic and atraumatic.   Neck: supple with no carotid or supraclavicular bruits Cardiovascular: regular rate and rhythm, no murmurs Musculoskeletal: Mild kyphoscoliosis  skin:  no rash/petichiae Vascular:  Normal pulses all extremities  Neurologic Exam Mental Status: Awake and fully alert. Oriented to place and time. Recent and remote memory poor t. Attention span, concentration and fund of knowledge diminished. Mood and affect appropriate.  Mini-Mental status exam score 19/30 ( last visit  18/30 )with deficits in orientation,  recall and visual-spatial skills.  He was able to name only 8 animals which can walk on 4 legs.  Clock drawing score 3/4.  He was able to copy intersecting pentagons.  Geriatric depression scale not done. Cranial Nerves: Fundoscopic exam reveals sharp disc margins. Pupils equal, briskly reactive to light. Extraocular movements full without nystagmus. Visual fields full to confrontation. Hearing diminished bilaterally. Facial sensation intact. Face, tongue, palate moves normally and symmetrically.  Motor: Normal bulk and tone. Normal strength in all tested extremity muscles.  Mild action tremor bilateral upper extremities left greater than right.  No cogwheel rigidity. Sensory.: intact to touch , pinprick , position and vibratory sensation.  Coordination: Rapid alternating movements normal in all extremities. Finger-to-nose and heel-to-shin performed accurately bilaterally. Gait and Station: Arises from chair without difficulty. Stance is normal. Gait demonstrates normal stride length and balance . Able to heel, toe and tandem walk with mild difficulty.  Reflexes: 1+ and symmetric. Toes downgoing.       ASSESSMENT: 86 year old Caucasian male with subacute memory and cognitive worsening likely due to dementia of Alzheimer's type.  He has been started on suboptimal doses of Aricept and Namenda with possibly slight improvement.     PLAN: I had a long discussion with the patient and his daughter regarding his memory loss and dementia which has shown some improvement on partial doses of Aricept and Namenda hence recommend increasing Namenda dose to 10 mg twice daily gradually over the next 1 month if tolerated.  Continue Aricept 5 mg daily due to relative high risk for side effects if increased.  We also discussed memory compensation strategies and encouraged him to increase participation in cognitively challenging activities like solving crossword puzzles, playing bridge and sudoku.  Check memory panel  labs, EEG and MRI scan.  Greater than 50% time during this 30-minute consultation visit was spent on counseling and coordination of care about his memory loss and dementia and discussion of evaluation and treatment plan and answering questions.  He will return for follow-up in the future in 2 months or call earlier if necessary. Antony Contras, MD  [facility] [address] [place], [place] [address]  Phone [phone] Fax [phone] Note: This document was prepared with digital dictation and possible smart phrase technology. Any  transcriptional errors that result from this process are unintentional.

## 2019-09-18 NOTE — Patient Instructions (Signed)
I had a long discussion with the patient and his daughter regarding his dementia and went over results of recent lab work, EEG and MRI scan and answered questions.  I recommend increase Namenda to 10 mg twice daily if tolerated without side effects.  Have given him a new prescription for the same.  Continue Aricept in the current dose of 5 mg daily.  Continue B12 injections and follow-up with primary care physician for repeat levels.  He will return for follow-up in the future in 3 months with my nurse practitioner Janett Billow  or call earlier if necessary.  Dementia Caregiver Guide Dementia is a term used to describe a number of symptoms that affect memory and thinking. The most common symptoms include:  Memory loss.  Trouble with language and communication.  Trouble concentrating.  Poor judgment.  Problems with reasoning.  Child-like behavior and language.  Extreme anxiety.  Angry outbursts.  Wandering from home or public places. Dementia usually gets worse slowly over time. In the early stages, people with dementia can stay independent and safe with some help. In later stages, they need help with daily tasks such as dressing, grooming, and using the bathroom. How to help the person with dementia cope Dementia can be frightening and confusing. Here are some tips to help the person with dementia cope with changes caused by the disease. General tips  Keep the person on track with his or her routine.  Try to identify areas where the person may need help.  Be supportive, patient, calm, and encouraging.  Gently remind the person that adjusting to changes takes time.  Help with the tasks that the person has asked for help with.  Keep the person involved in daily tasks and decisions as much as possible.  Encourage conversation, but try not to get frustrated or harried if the person struggles to find words or does not seem to appreciate your help. Communication tips  When the person  is talking or seems frustrated, make eye contact and hold the person's hand.  Ask specific questions that need yes or no answers.  Use simple words, short sentences, and a calm voice. Only give one direction at a time.  When offering choices, limit them to just 1 or 2.  Avoid correcting the person in a negative way.  If the person is struggling to find the right words, gently try to help him or her. How to recognize symptoms of stress Symptoms of stress in caregivers include:  Feeling frustrated or angry with the person with dementia.  Denying that the person has dementia or that his or her symptoms will not improve.  Feeling hopeless and unappreciated.  Difficulty sleeping.  Difficulty concentrating.  Feeling anxious, irritable, or depressed.  Developing stress-related health problems.  Feeling like you have too little time for your own life. Follow these instructions at home:   Make sure that you and the person you are caring for: ? Get regular sleep. ? Exercise regularly. ? Eat regular, nutritious meals. ? Drink enough fluid to keep your urine clear or pale yellow. ? Take over-the-counter and prescription medicines only as told by your health care providers. ? Attend all scheduled health care appointments.  Join a support group with others who are caregivers.  Ask about respite care resources so that you can have a regular break from the stress of caregiving.  Look for signs of stress in yourself and in the person you are caring for. If you notice signs of stress, take  steps to manage it.  Consider any safety risks and take steps to avoid them.  Organize medications in a pill box for each day of the week.  Create a plan to handle any legal or financial matters. Get legal or financial advice if needed.  Keep a calendar in a central location to remind the person of appointments or other activities. Tips for reducing the risk of injury  Keep floors clear of  clutter. Remove rugs, magazine racks, and floor lamps.  Keep hallways well lit, especially at night.  Put a handrail and nonslip mat in the bathtub or shower.  Put childproof locks on cabinets that contain dangerous items, such as medicines, alcohol, guns, toxic cleaning items, sharp tools or utensils, matches, and lighters.  Put the locks in places where the person cannot see or reach them easily. This will help ensure that the person does not wander out of the house and get lost.  Be prepared for emergencies. Keep a list of emergency phone numbers and addresses in a convenient area.  Remove car keys and lock garage doors so that the person does not try to get in the car and drive.  Have the person wear a bracelet that tracks locations and identifies the person as having memory problems. This should be worn at all times for safety. Where to find support: Many individuals and organizations offer support. These include:  Support groups for people with dementia and for caregivers.  Counselors or therapists.  Home health care services.  Adult day care centers. Where to find more information Alzheimer's Association: [employer] Contact a health care provider if:  The person's health is rapidly getting worse.  You are no longer able to care for the person.  Caring for the person is affecting your physical and emotional health.  The person threatens himself or herself, you, or anyone else. Summary  Dementia is a term used to describe a number of symptoms that affect memory and thinking.  Dementia usually gets worse slowly over time.  Take steps to reduce the person's risk of injury, and to plan for future care.  Caregivers need support, relief from caregiving, and time for their own lives. This information is not intended to replace advice given to you by your health care provider. Make sure you discuss any questions you have with your health care provider. Document Revised:  02/05/2017 Document Reviewed: 01/28/2016 Elsevier Patient Education  2020 [employer].

## 2019-09-21 ENCOUNTER — Ambulatory Visit: Payer: PPO

## 2019-09-22 DIAGNOSIS — H04123 Dry eye syndrome of bilateral lacrimal glands: Secondary | ICD-10-CM | POA: Diagnosis not present

## 2019-10-02 ENCOUNTER — Other Ambulatory Visit: Payer: Self-pay | Admitting: *Deleted

## 2019-10-02 DIAGNOSIS — J301 Allergic rhinitis due to pollen: Secondary | ICD-10-CM

## 2019-10-02 MED ORDER — LEVOCETIRIZINE DIHYDROCHLORIDE 5 MG PO TABS: 2.5000 mg | ORAL_TABLET | Freq: Every evening | ORAL | 5 refills | Status: DC

## 2019-10-05 ENCOUNTER — Ambulatory Visit: Payer: PPO

## 2019-10-09 ENCOUNTER — Ambulatory Visit (INDEPENDENT_AMBULATORY_CARE_PROVIDER_SITE_OTHER): Payer: PPO | Admitting: *Deleted

## 2019-10-09 ENCOUNTER — Other Ambulatory Visit: Payer: Self-pay

## 2019-10-09 DIAGNOSIS — E538 Deficiency of other specified B group vitamins: Secondary | ICD-10-CM

## 2019-10-09 NOTE — Progress Notes (Signed)
b12 injection given and tolerated well  

## 2019-10-16 ENCOUNTER — Ambulatory Visit (INDEPENDENT_AMBULATORY_CARE_PROVIDER_SITE_OTHER): Payer: PPO | Admitting: Nurse Practitioner

## 2019-10-16 ENCOUNTER — Encounter: Payer: Self-pay | Admitting: Nurse Practitioner

## 2019-10-16 ENCOUNTER — Other Ambulatory Visit: Payer: Self-pay

## 2019-10-16 VITALS — BP 156/80 | HR 75 | Temp 98.6°F | Resp 20 | Ht 66.0 in | Wt 140.0 lb

## 2019-10-16 DIAGNOSIS — M549 Dorsalgia, unspecified: Secondary | ICD-10-CM

## 2019-10-16 DIAGNOSIS — R413 Other amnesia: Secondary | ICD-10-CM | POA: Diagnosis not present

## 2019-10-16 DIAGNOSIS — N400 Enlarged prostate without lower urinary tract symptoms: Secondary | ICD-10-CM | POA: Diagnosis not present

## 2019-10-16 DIAGNOSIS — F5101 Primary insomnia: Secondary | ICD-10-CM

## 2019-10-16 DIAGNOSIS — J449 Chronic obstructive pulmonary disease, unspecified: Secondary | ICD-10-CM

## 2019-10-16 DIAGNOSIS — R1310 Dysphagia, unspecified: Secondary | ICD-10-CM

## 2019-10-16 DIAGNOSIS — G629 Polyneuropathy, unspecified: Secondary | ICD-10-CM | POA: Diagnosis not present

## 2019-10-16 DIAGNOSIS — J4489 Other specified chronic obstructive pulmonary disease: Secondary | ICD-10-CM

## 2019-10-16 DIAGNOSIS — E785 Hyperlipidemia, unspecified: Secondary | ICD-10-CM

## 2019-10-16 DIAGNOSIS — E559 Vitamin D deficiency, unspecified: Secondary | ICD-10-CM

## 2019-10-16 DIAGNOSIS — K219 Gastro-esophageal reflux disease without esophagitis: Secondary | ICD-10-CM

## 2019-10-16 DIAGNOSIS — I700 Atherosclerosis of aorta: Secondary | ICD-10-CM | POA: Diagnosis not present

## 2019-10-16 MED ORDER — FINASTERIDE 5 MG PO TABS: 5.0000 mg | ORAL_TABLET | Freq: Every day | ORAL | 1 refills | Status: DC

## 2019-10-16 MED ORDER — DONEPEZIL HCL 5 MG PO TABS: 5.0000 mg | ORAL_TABLET | Freq: Every day | ORAL | 5 refills | Status: DC

## 2019-10-16 MED ORDER — MEMANTINE HCL 10 MG PO TABS: 10.0000 mg | ORAL_TABLET | Freq: Two times a day (BID) | ORAL | 3 refills | Status: DC

## 2019-10-16 MED ORDER — TERAZOSIN HCL 5 MG PO CAPS: ORAL_CAPSULE | ORAL | 1 refills | Status: DC

## 2019-10-16 MED ORDER — PANTOPRAZOLE SODIUM 40 MG PO TBEC: 40.0000 mg | DELAYED_RELEASE_TABLET | Freq: Every day | ORAL | 5 refills | Status: DC

## 2019-10-16 MED ORDER — TRELEGY ELLIPTA 100-62.5-25 MCG/INH IN AEPB: 1.0000 | INHALATION_SPRAY | Freq: Every day | RESPIRATORY_TRACT | 11 refills | Status: DC

## 2019-10-16 NOTE — Patient Instructions (Signed)
Alzheimer Disease Caregiver Guide  Alzheimer disease causes a person to lose the ability to remember things and make decisions. A person who has Alzheimer disease may not be able to take care of himself or herself. He or she may need help with simple tasks. The tips below can help you care for the person. What kind of changes does this condition cause? This condition makes a person:  Forget things.  Feel confused.  Act differently.  Have different moods. These things get worse with time. Tips to help with symptoms  Be calm and patient.  Respond with a simple, short answer.  Avoid correcting the person in a negative way.  Try not to take things personally, even if the person forgets your name.  Do not argue with the person. This may make the person more upset. Tips to lessen frustration  Make appointments and do daily tasks when the person is at his or her best.  Take your time. Simple tasks may take longer. Allow plenty of time to complete tasks.  Limit choices for the person.  Involve the person in what you are doing.  Keep a daily routine.  Avoid new or crowded places, if possible.  Use simple words, short sentences, and a calm voice. Only give one direction at a time.  Buy clothes and shoes that are easy to put on and take off.  Organize medicines in a pillbox for each day of the week.  Keep a calendar in a central location to remind the person of meetings or other activities.  Let people help if they offer. Take a break when needed. Tips to prevent injury  Keep floors clear. Remove rugs, magazine racks, and floor lamps.  Keep hallways well-lit.  Put a handrail and non-slip mat in the bathtub or shower.  Put childproof locks on cabinets that have dangerous items in them. These items include medicine, alcohol, guns, toxic cleaning items, sharp tools, matches, and lighters.  Put locks on doors where the person cannot see or reach them. This helps the person  to not wander out of the house and get lost.  Be prepared for emergencies. Keep a list of emergency phone numbers and addresses close by.  Bracelets may be worn that track location and identify the person as having memory problems. This should be worn at all times for safety. Tips for the future  Discuss financial and legal planning early. People with this disease have trouble managing their money as the disease gets worse. Get help from a professional.  Talk about advance directives, safety, and daily care. Take these steps: ? Create a living will and choose a power of attorney. This is someone who can make decisions for the person with Alzheimer disease when he or she can no longer do so. ? Discuss driving safety and when to stop driving. The person's doctor can help with this. ? If the person lives alone, make sure he or she is safe. Some people need extra help at home. Other people need more care at a nursing home or care center. Where to find support You can find support by joining a support group near you. Some benefits of joining a support group include:  Learning ways to manage stress.  Sharing experiences with others.  Getting emotional comfort and support.  Learning about caregiving as the disease progresses.  Knowing what community resources are available and making use of them. Where to find more information  Alzheimer's Association: [employer] Contact a doctor if:  The person has a fever.  The person has a sudden behavior change that does not get better with calming strategies.  The person is not able to take care of himself or herself at home.  The person threatens you or anyone else, including himself or herself.  You are no longer able to care for the person. Summary  Alzheimer disease causes a person to forget things and to be confused.  A person who has this condition may not be able to take care of himself or herself.  Take steps to keep the  person from getting hurt. Plan for future care.  You can find support by joining a support group near you. This information is not intended to replace advice given to you by your health care provider. Make sure you discuss any questions you have with your health care provider. Document Revised: 06/14/2018 Document Reviewed: 02/18/2017 Elsevier Patient Education  2020 Else[employer] Inc[employer]  

## 2019-10-16 NOTE — Progress Notes (Signed)
Subjective:    Patient ID: Carlos Brooks, male    DOB: [birth year], 86 y.o.   MRN: [id number]   Chief Complaint: Medical Management of Chronic Issues    HPI:  1. Thoracic aorta atherosclerosis [facility]) Last cardiology appointment was 02/15/19. According to office note he was doing well and no changes were made to plan of care.they called him several weeks later and wanted to start him on repatha.  2. COPD (chronic obstructive pulmonary disease) with chronic bronchitis ([place]) He experiences SOB with almost any activity. He uses his treligy inhaler daily and albuterol as needed. His wife says he uses his albuterol 2-3 x a week.  3. Gastroesophageal reflux disease without esophagitis Had appointment with GI for difficulity swallowing. Cardiologist felt it was coming from GERD. He had some test but did not put him on any medication. He was already on protonix daily.  4. Neuropathy Just c/o feet burning and hurting. Denied any sores of lesiion on feet.  5. Benign prostatic hyperplasia without lower urinary tract symptoms He is on proscar and hytrin- has no problems voiding although he voids often  6. Back pain, unspecified back location, unspecified back pain laterality, unspecified chronicity He has had chronic back pain for sometime. Not as bad as it was. He takes tylenol as needed.  7. Hyperlipidemia LDL goal <100 Wife tries to watch what she feeds him. He is not capable of doing much exercise.   8. Late onset alzhiemers Has seen nurologyand was put on acricept and namenda. Wife says he is maintaing at this time.   9. Primary insomnia No problems sleeping. Some nights are better then others , but wife says he sleeps well most nights. He take sativan as needed but his wife says he has  Not been taking it.  10. Difficulty swallowing solids No problems usless he does not chew food well.  11. Vitamin D deficiency Takes dialy vitamin d supplement    Outpatient Encounter  Medications as of 10/16/2019  Medication Sig  . aspirin 81 MG EC tablet Take 81 mg by mouth daily.    Marland Kitchen donepezil (ARICEPT) 5 MG tablet Take 1 tablet (5 mg total) by mouth at bedtime.  . Evolocumab (REPATHA SURECLICK) 902 MG/ML SOAJ Inject 140 mg into the skin every 14 (fourteen) days.  . finasteride (PROSCAR) 5 MG tablet Take 1 tablet (5 mg total) by mouth daily.  . fluticasone (FLONASE) 50 MCG/ACT nasal spray Place 2 sprays into both nostrils daily.  . Fluticasone-Umeclidin-Vilant (TRELEGY ELLIPTA) 100-62.5-25 MCG/INH AEPB Inhale 1 puff into the lungs daily.  Marland Kitchen guaiFENesin (MUCINEX) 600 MG 12 hr tablet Take 1,200 mg by mouth 2 (two) times daily.  Marland Kitchen levocetirizine (XYZAL) 5 MG tablet Take 0.5 tablets (2.5 mg total) by mouth every evening.  Marland Kitchen LORazepam (ATIVAN) 0.5 MG tablet Take nightly as needed for sleep  . memantine (NAMENDA) 10 MG tablet Take 1 tablet (10 mg total) by mouth 2 (two) times daily.  . pantoprazole (PROTONIX) 40 MG tablet TAKE 1 TABLET EVERY DAY  . PROAIR HFA 108 (90 Base) MCG/ACT inhaler 2 PUFFS EVERY 6 HOURS AS NEEDED FOR WHEEZING OR SHORTNESS OF BREATH  . terazosin (HYTRIN) 5 MG capsule TAKE (1) CAPSULE DAILY     Past Surgical History:  Procedure Laterality Date  . EYE SURGERY    . HERNIA REPAIR     x 3, inguinal   . skin cancer removal     head    Family History  Problem  Relation Age of Onset  . Cancer Mother        [name]  . Cancer Father        lung  . Heart attack Brother 81       Not clear details  . Heart attack Son 31       Died with an MI  . Heart disease Son   . Cancer Brother        liver  . Heart attack Brother 45  . Aneurysm Brother   . Hypertension Son   . Obesity Son   . Asthma Son     New complaints: None today  Social history: Lives with wife- daughter checks on them daily  Controlled substance contract: n/a    Review of Systems  Constitutional: Negative for diaphoresis.  Eyes: Negative for pain.  Respiratory: Negative  for shortness of breath.   Cardiovascular: Negative for chest pain, palpitations and leg swelling.  Gastrointestinal: Negative for abdominal pain.  Endocrine: Negative for polydipsia.  Skin: Negative for rash.  Neurological: Negative for dizziness, weakness and headaches.  Hematological: Does not bruise/bleed easily.  All other systems reviewed and are negative.      Objective:   Physical Exam Vitals and nursing note reviewed.  Constitutional:      Appearance: Normal appearance. He is well-developed.  HENT:     Head: Normocephalic.     Nose: Nose normal.  Eyes:     Pupils: Pupils are equal, round, and reactive to light.  Neck:     Thyroid: No thyroid mass or thyromegaly.     Vascular: No carotid bruit or JVD.     Trachea: Phonation normal.  Cardiovascular:     Rate and Rhythm: Normal rate and regular rhythm.  Pulmonary:     Effort: Pulmonary effort is normal. No respiratory distress.     Breath sounds: Normal breath sounds.  Abdominal:     General: Bowel sounds are normal.     Palpations: Abdomen is soft.     Tenderness: There is no abdominal tenderness.  Musculoskeletal:        General: Normal range of motion.     Cervical back: Normal range of motion and neck supple.  Lymphadenopathy:     Cervical: No cervical adenopathy.  Skin:    General: Skin is warm and dry.  Neurological:     Mental Status: He is alert and oriented to person, place, and time.  Psychiatric:        Behavior: Behavior normal.        Thought Content: Thought content normal.        Judgment: Judgment normal.     BP (!) 156/80   Pulse 75   Temp 98.6 F (37 C) (Temporal)   Resp 20   Ht _0  (1.676 m)   Wt 140 lb (63.5 kg)   SpO2 91%   BMI 22.60 kg/m        Assessment & Plan:  Carlos Brooks comes in today with chief complaint of Medical Management of Chronic Issues   Diagnosis and orders addressed:  1. Thoracic aorta atherosclerosis ([place])  2. COPD (chronic obstructive  pulmonary disease) with chronic bronchitis ([facility]) onloy use albuterol as needed - Fluticasone-Umeclidin-Vilant (TRELEGY ELLIPTA) 100-62.5-25 MCG/INH AEPB; Inhale 1 puff into the lungs daily.  Dispense: 28 each; Refill: 11  3. Gastroesophageal reflux disease without esophagitis Avoid spicy foods Do not eat 2 hours prior to bedtime  4. Neuropathy Do not go barefooted  5.  Benign prostatic hyperplasia without lower urinary tract symptoms See urology if has problems - finasteride (PROSCAR) 5 MG tablet; Take 1 tablet (5 mg total) by mouth daily.  Dispense: 90 tablet; Refill: 1 - terazosin (HYTRIN) 5 MG capsule; TAKE (1) CAPSULE DAILY  Dispense: 90 capsule; Refill: 1  6. Back pain, unspecified back location, unspecified back pain laterality, unspecified chronicity Moist heat and rest  7. Hyperlipidemia LDL goal <100 Low fat diet - CBC with Differential/Platelet - CMP14+EGFR - Lipid panel  8. Memory loss [place] daily - donepezil (ARICEPT) 5 MG tablet; Take 1 tablet (5 mg total) by mouth at bedtime.  Dispense: 30 tablet; Refill: 5  9. Primary insomnia Bedtime routine  10. Difficulty swallowing solids Chew all food well  11. Vitamin D deficiency Daily vitamind supplements  12. COPD (chronic obstructive pulmonary disease) with chronic bronchitis ([place]) - Fluticasone-Umeclidin-Vilant (TRELEGY ELLIPTA) 100-62.5-25 MCG/INH AEPB; Inhale 1 puff into the lungs daily.  Dispense: 28 each; Refill: 11   Labs pending Health Maintenance reviewed Diet and exercise encouraged  Follow up plan: 6 months   Mary-Margaret Hassell Done, FNP

## 2019-10-17 LAB — CBC WITH DIFFERENTIAL/PLATELET
Basophils Absolute: 0.1 10*3/uL (ref 0.0–0.2)
Basos: 1 %
EOS (ABSOLUTE): 1.2 10*3/uL — ABNORMAL HIGH (ref 0.0–0.4)
Eos: 14 %
Hematocrit: 43.3 % (ref 37.5–51.0)
Hemoglobin: 14.6 g/dL (ref 13.0–17.7)
Immature Grans (Abs): 0 10*3/uL (ref 0.0–0.1)
Immature Granulocytes: 0 %
Lymphocytes Absolute: 2.4 10*3/uL (ref 0.7–3.1)
Lymphs: 27 %
MCH: 31.1 pg (ref 26.6–33.0)
MCHC: 33.7 g/dL (ref 31.5–35.7)
MCV: 92 fL (ref 79–97)
Monocytes Absolute: 0.9 10*3/uL (ref 0.1–0.9)
Monocytes: 10 %
Neutrophils Absolute: 4.2 10*3/uL (ref 1.4–7.0)
Neutrophils: 48 %
Platelets: 160 10*3/uL (ref 150–450)
RBC: 4.7 x10E6/uL (ref 4.14–5.80)
RDW: 12.9 % (ref 11.6–15.4)
WBC: 8.8 10*3/uL (ref 3.4–10.8)

## 2019-10-17 LAB — CMP14+EGFR
ALT: 16 IU/L (ref 0–44)
AST: 23 IU/L (ref 0–40)
Albumin/Globulin Ratio: 2.1 (ref 1.2–2.2)
Albumin: 4.5 g/dL (ref 3.5–4.6)
Alkaline Phosphatase: 55 IU/L (ref 48–121)
BUN/Creatinine Ratio: 15 (ref 10–24)
BUN: 18 mg/dL (ref 10–36)
Bilirubin Total: 0.3 mg/dL (ref 0.0–1.2)
CO2: 27 mmol/L (ref 20–29)
Calcium: 9.4 mg/dL (ref 8.6–10.2)
Chloride: 102 mmol/L (ref 96–106)
Creatinine, Ser: 1.21 mg/dL (ref 0.76–1.27)
GFR calc Af Amer: 60 mL/min/{1.73_m2} (ref >59)
GFR calc non Af Amer: 52 mL/min/{1.73_m2} — ABNORMAL LOW (ref >59)
Globulin, Total: 2.1 g/dL (ref 1.5–4.5)
Glucose: 106 mg/dL — ABNORMAL HIGH (ref 65–99)
Potassium: 4.1 mmol/L (ref 3.5–5.2)
Sodium: 142 mmol/L (ref 134–144)
Total Protein: 6.6 g/dL (ref 6.0–8.5)

## 2019-10-17 LAB — LIPID PANEL
Chol/HDL Ratio: 2.4 ratio (ref 0.0–5.0)
Cholesterol, Total: 121 mg/dL (ref 100–199)
HDL: 51 mg/dL (ref >39)
LDL Chol Calc (NIH): 34 mg/dL (ref 0–99)
Triglycerides: 235 mg/dL — ABNORMAL HIGH (ref 0–149)
VLDL Cholesterol Cal: 36 mg/dL (ref 5–40)

## 2019-10-25 ENCOUNTER — Telehealth: Payer: Self-pay | Admitting: Nurse Practitioner

## 2019-10-25 ENCOUNTER — Telehealth: Payer: Self-pay

## 2019-10-25 NOTE — Telephone Encounter (Signed)
Received fax from Carlos Brooks requesting a refill on lorazepam 0.5mg . Patient recently seen and this is not on his list. Please review and advise. Doesn't  come back to see you until February.

## 2019-10-25 NOTE — Telephone Encounter (Signed)
Pt's daughter states he is having some trouble with the increased Namenda dose of 10mg  twice daily and would like him to go back on the 5mg  twice daily. Also she would like him to have his B12 checked.

## 2019-10-27 ENCOUNTER — Other Ambulatory Visit: Payer: Self-pay

## 2019-10-27 DIAGNOSIS — E538 Deficiency of other specified B group vitamins: Secondary | ICD-10-CM

## 2019-10-27 NOTE — Telephone Encounter (Signed)
Spoke with daughter and she states that pt  is changing his namenda to 1 1/2 daily instead of two. They felt like the 2 was too much. His neurologist started him on two but they wanted you to know that they decreased. FYI

## 2019-10-27 NOTE — Telephone Encounter (Signed)
Namenda 5mg  bid is fine- will have to have bllod work done to check his b12

## 2019-10-30 DIAGNOSIS — L570 Actinic keratosis: Secondary | ICD-10-CM | POA: Diagnosis not present

## 2019-10-30 DIAGNOSIS — L821 Other seborrheic keratosis: Secondary | ICD-10-CM | POA: Diagnosis not present

## 2019-10-30 DIAGNOSIS — D485 Neoplasm of uncertain behavior of skin: Secondary | ICD-10-CM | POA: Diagnosis not present

## 2019-10-30 DIAGNOSIS — Z85828 Personal history of other malignant neoplasm of skin: Secondary | ICD-10-CM | POA: Diagnosis not present

## 2019-10-30 DIAGNOSIS — C44319 Basal cell carcinoma of skin of other parts of face: Secondary | ICD-10-CM | POA: Diagnosis not present

## 2019-11-01 ENCOUNTER — Other Ambulatory Visit: Payer: Self-pay | Admitting: *Deleted

## 2019-11-01 DIAGNOSIS — F5101 Primary insomnia: Secondary | ICD-10-CM

## 2019-11-10 ENCOUNTER — Other Ambulatory Visit: Payer: Self-pay

## 2019-11-10 ENCOUNTER — Other Ambulatory Visit: Payer: Self-pay | Admitting: Nurse Practitioner

## 2019-11-10 ENCOUNTER — Ambulatory Visit (INDEPENDENT_AMBULATORY_CARE_PROVIDER_SITE_OTHER): Payer: PPO | Admitting: *Deleted

## 2019-11-10 DIAGNOSIS — E538 Deficiency of other specified B group vitamins: Secondary | ICD-10-CM | POA: Diagnosis not present

## 2019-11-10 DIAGNOSIS — R413 Other amnesia: Secondary | ICD-10-CM

## 2019-11-10 MED ORDER — MEMANTINE HCL 5 MG PO TABS: 5.0000 mg | ORAL_TABLET | Freq: Three times a day (TID) | ORAL | 5 refills | Status: DC

## 2019-11-10 NOTE — Patient Instructions (Signed)

## 2019-11-10 NOTE — Progress Notes (Signed)
Pt given B12 injection IM left deltoid and tolerated well. °

## 2019-11-11 LAB — VITAMIN B12: Vitamin B-12: 368 pg/mL (ref 232–1245)

## 2019-11-21 DIAGNOSIS — C44319 Basal cell carcinoma of skin of other parts of face: Secondary | ICD-10-CM | POA: Diagnosis not present

## 2019-11-21 DIAGNOSIS — Z85828 Personal history of other malignant neoplasm of skin: Secondary | ICD-10-CM | POA: Diagnosis not present

## 2019-12-04 ENCOUNTER — Telehealth: Payer: Self-pay | Admitting: Nurse Practitioner

## 2019-12-04 NOTE — Telephone Encounter (Signed)
Pts daughter called stating that when pt used to see Dr Thayer Ohm, she had prescribed him a nerve pill to take at night to help him sleep. Daughter wants to know if MMM can re-prescribe that to pt or something that will help because pt has not been sleeping well. Daughter says because of pt's age and health, its hard to get him out of the house.

## 2019-12-05 NOTE — Telephone Encounter (Signed)
Looks like she gave him ativan which cannot be prescribed without being seen. I know it is difficult to bring him in but I reaaly do need to see him for this

## 2019-12-05 NOTE — Telephone Encounter (Signed)
Pt scheduled with MMM 12/11/19 at 8:30 to discuss meds.

## 2019-12-11 ENCOUNTER — Encounter: Payer: Self-pay | Admitting: Nurse Practitioner

## 2019-12-11 ENCOUNTER — Ambulatory Visit (INDEPENDENT_AMBULATORY_CARE_PROVIDER_SITE_OTHER): Payer: PPO | Admitting: Nurse Practitioner

## 2019-12-11 ENCOUNTER — Other Ambulatory Visit: Payer: Self-pay

## 2019-12-11 ENCOUNTER — Ambulatory Visit (INDEPENDENT_AMBULATORY_CARE_PROVIDER_SITE_OTHER): Payer: PPO

## 2019-12-11 VITALS — BP 147/74 | HR 52 | Temp 97.5°F | Resp 20 | Ht 66.0 in | Wt 137.0 lb

## 2019-12-11 DIAGNOSIS — Z23 Encounter for immunization: Secondary | ICD-10-CM | POA: Diagnosis not present

## 2019-12-11 DIAGNOSIS — E538 Deficiency of other specified B group vitamins: Secondary | ICD-10-CM | POA: Diagnosis not present

## 2019-12-11 DIAGNOSIS — F5101 Primary insomnia: Secondary | ICD-10-CM

## 2019-12-11 MED ORDER — LORAZEPAM 0.5 MG PO TABS: ORAL_TABLET | ORAL | 5 refills | Status: DC

## 2019-12-11 NOTE — Progress Notes (Signed)
   Subjective:    Patient ID: Carlos Brooks, male    DOB: 1926-10-01, 86 y.o.   MRN: [id number]   Chief Complaint: insomnia  HPI Patient come sin today accompanied by his wife. He is c/o trouble sleeping. He  He says that he has trouble falling asleep. He says after he goes to sleep he wakes up frequently. He use to be on a low does ativan to sleep and that worked well for him.     Review of Systems  Constitutional: Negative for diaphoresis.  Eyes: Negative for pain.  Respiratory: Negative for shortness of breath.   Cardiovascular: Negative for chest pain, palpitations and leg swelling.  Gastrointestinal: Negative for abdominal pain.  Endocrine: Negative for polydipsia.  Skin: Negative for rash.  Neurological: Negative for dizziness, weakness and headaches.  Hematological: Does not bruise/bleed easily.  All other systems reviewed and are negative.      Objective:   Physical Exam Vitals and nursing note reviewed.  Constitutional:      Appearance: Normal appearance.  Cardiovascular:     Rate and Rhythm: Normal rate and regular rhythm.  Pulmonary:     Breath sounds: Normal breath sounds.  Neurological:     General: No focal deficit present.     Mental Status: He is alert.  Psychiatric:        Mood and Affect: Mood normal.        Behavior: Behavior normal.    BP (!) 147/74   Pulse (!) 52   Temp (!) 97.5 F (36.4 C) (Temporal)   Resp 20   Ht 5\' 6"  (1.676 m)   Wt 137 lb (62.1 kg)   SpO2 93%   BMI 22.11 kg/m         Assessment & Plan:  Carlos Brooks in today with chief complaint of Insomnia   1. Primary insomnia Bedtime routin Try only taking 1/2 tablet and see if helps. - LORazepam (ATIVAN) 0.5 MG tablet; Take nightly as needed for sleep  Dispense: 30 tablet; Refill: 5    The above assessment and management plan was discussed with the patient. The patient verbalized understanding of and has agreed to the management plan. Patient is aware to call  the clinic if symptoms persist or worsen. Patient is aware when to return to the clinic for a follow-up visit. Patient educated on when it is appropriate to go to the emergency department.   Mary-Margaret Hassell Done, FNP

## 2019-12-11 NOTE — Progress Notes (Signed)
B12 given and patient tolerated well.

## 2020-01-02 ENCOUNTER — Encounter (HOSPITAL_COMMUNITY): Payer: Self-pay | Admitting: Family Medicine

## 2020-01-02 ENCOUNTER — Emergency Department (HOSPITAL_COMMUNITY): Payer: PPO

## 2020-01-02 ENCOUNTER — Inpatient Hospital Stay (HOSPITAL_COMMUNITY)
Admission: EM | Admit: 2020-01-02 | Discharge: 2020-01-06 | DRG: 438 | Disposition: A | Discharge status: Home Health | Payer: PPO | Attending: Family Medicine | Admitting: Family Medicine

## 2020-01-02 ENCOUNTER — Other Ambulatory Visit: Payer: Self-pay

## 2020-01-02 DIAGNOSIS — N400 Enlarged prostate without lower urinary tract symptoms: Secondary | ICD-10-CM | POA: Diagnosis not present

## 2020-01-02 DIAGNOSIS — Z8041 Family history of malignant neoplasm of ovary: Secondary | ICD-10-CM

## 2020-01-02 DIAGNOSIS — I700 Atherosclerosis of aorta: Secondary | ICD-10-CM | POA: Diagnosis present

## 2020-01-02 DIAGNOSIS — K810 Acute cholecystitis: Secondary | ICD-10-CM | POA: Diagnosis present

## 2020-01-02 DIAGNOSIS — R0689 Other abnormalities of breathing: Secondary | ICD-10-CM | POA: Diagnosis not present

## 2020-01-02 DIAGNOSIS — Z20822 Contact with and (suspected) exposure to covid-19: Secondary | ICD-10-CM | POA: Diagnosis not present

## 2020-01-02 DIAGNOSIS — J449 Chronic obstructive pulmonary disease, unspecified: Secondary | ICD-10-CM | POA: Diagnosis present

## 2020-01-02 DIAGNOSIS — I701 Atherosclerosis of renal artery: Secondary | ICD-10-CM | POA: Diagnosis not present

## 2020-01-02 DIAGNOSIS — E785 Hyperlipidemia, unspecified: Secondary | ICD-10-CM | POA: Diagnosis present

## 2020-01-02 DIAGNOSIS — K819 Cholecystitis, unspecified: Secondary | ICD-10-CM

## 2020-01-02 DIAGNOSIS — Z7982 Long term (current) use of aspirin: Secondary | ICD-10-CM | POA: Diagnosis not present

## 2020-01-02 DIAGNOSIS — Z887 Allergy status to serum and vaccine status: Secondary | ICD-10-CM

## 2020-01-02 DIAGNOSIS — Z801 Family history of malignant neoplasm of trachea, bronchus and lung: Secondary | ICD-10-CM

## 2020-01-02 DIAGNOSIS — R52 Pain, unspecified: Secondary | ICD-10-CM

## 2020-01-02 DIAGNOSIS — F0390 Unspecified dementia without behavioral disturbance: Secondary | ICD-10-CM | POA: Diagnosis present

## 2020-01-02 DIAGNOSIS — Z881 Allergy status to other antibiotic agents status: Secondary | ICD-10-CM | POA: Diagnosis not present

## 2020-01-02 DIAGNOSIS — J9601 Acute respiratory failure with hypoxia: Secondary | ICD-10-CM | POA: Diagnosis present

## 2020-01-02 DIAGNOSIS — K449 Diaphragmatic hernia without obstruction or gangrene: Secondary | ICD-10-CM | POA: Diagnosis not present

## 2020-01-02 DIAGNOSIS — Z79899 Other long term (current) drug therapy: Secondary | ICD-10-CM

## 2020-01-02 DIAGNOSIS — Z825 Family history of asthma and other chronic lower respiratory diseases: Secondary | ICD-10-CM | POA: Diagnosis not present

## 2020-01-02 DIAGNOSIS — Z888 Allergy status to other drugs, medicaments and biological substances status: Secondary | ICD-10-CM

## 2020-01-02 DIAGNOSIS — Z8249 Family history of ischemic heart disease and other diseases of the circulatory system: Secondary | ICD-10-CM | POA: Diagnosis not present

## 2020-01-02 DIAGNOSIS — K8042 Calculus of bile duct with acute cholecystitis without obstruction: Secondary | ICD-10-CM | POA: Diagnosis not present

## 2020-01-02 DIAGNOSIS — Z7951 Long term (current) use of inhaled steroids: Secondary | ICD-10-CM

## 2020-01-02 DIAGNOSIS — I447 Left bundle-branch block, unspecified: Secondary | ICD-10-CM | POA: Diagnosis present

## 2020-01-02 DIAGNOSIS — K8500 Idiopathic acute pancreatitis without necrosis or infection: Secondary | ICD-10-CM | POA: Diagnosis not present

## 2020-01-02 DIAGNOSIS — R0902 Hypoxemia: Secondary | ICD-10-CM | POA: Diagnosis not present

## 2020-01-02 DIAGNOSIS — Z800 Family history of malignant neoplasm of digestive organs: Secondary | ICD-10-CM

## 2020-01-02 DIAGNOSIS — K635 Polyp of colon: Secondary | ICD-10-CM | POA: Diagnosis present

## 2020-01-02 DIAGNOSIS — R079 Chest pain, unspecified: Secondary | ICD-10-CM | POA: Diagnosis not present

## 2020-01-02 DIAGNOSIS — K859 Acute pancreatitis without necrosis or infection, unspecified: Secondary | ICD-10-CM | POA: Diagnosis not present

## 2020-01-02 DIAGNOSIS — R0789 Other chest pain: Secondary | ICD-10-CM | POA: Diagnosis not present

## 2020-01-02 DIAGNOSIS — K7689 Other specified diseases of liver: Secondary | ICD-10-CM | POA: Diagnosis not present

## 2020-01-02 DIAGNOSIS — R001 Bradycardia, unspecified: Secondary | ICD-10-CM | POA: Diagnosis not present

## 2020-01-02 DIAGNOSIS — K8050 Calculus of bile duct without cholangitis or cholecystitis without obstruction: Secondary | ICD-10-CM

## 2020-01-02 DIAGNOSIS — K760 Fatty (change of) liver, not elsewhere classified: Secondary | ICD-10-CM | POA: Diagnosis present

## 2020-01-02 DIAGNOSIS — Z87891 Personal history of nicotine dependence: Secondary | ICD-10-CM | POA: Diagnosis not present

## 2020-01-02 DIAGNOSIS — K862 Cyst of pancreas: Secondary | ICD-10-CM | POA: Diagnosis present

## 2020-01-02 DIAGNOSIS — Z85828 Personal history of other malignant neoplasm of skin: Secondary | ICD-10-CM | POA: Diagnosis not present

## 2020-01-02 DIAGNOSIS — R1013 Epigastric pain: Secondary | ICD-10-CM

## 2020-01-02 DIAGNOSIS — K219 Gastro-esophageal reflux disease without esophagitis: Secondary | ICD-10-CM | POA: Diagnosis present

## 2020-01-02 DIAGNOSIS — K828 Other specified diseases of gallbladder: Secondary | ICD-10-CM | POA: Diagnosis not present

## 2020-01-02 DIAGNOSIS — K222 Esophageal obstruction: Secondary | ICD-10-CM | POA: Diagnosis present

## 2020-01-02 DIAGNOSIS — E538 Deficiency of other specified B group vitamins: Secondary | ICD-10-CM | POA: Diagnosis not present

## 2020-01-02 DIAGNOSIS — K5730 Diverticulosis of large intestine without perforation or abscess without bleeding: Secondary | ICD-10-CM | POA: Diagnosis present

## 2020-01-02 DIAGNOSIS — E876 Hypokalemia: Secondary | ICD-10-CM | POA: Diagnosis not present

## 2020-01-02 LAB — RESPIRATORY PANEL BY RT PCR (FLU A&B, COVID)
Influenza A by PCR: NEGATIVE
Influenza B by PCR: NEGATIVE
SARS Coronavirus 2 by RT PCR: NEGATIVE

## 2020-01-02 LAB — TYPE AND SCREEN
ABO/RH(D): O POS
Antibody Screen: NEGATIVE

## 2020-01-02 LAB — HEPATIC FUNCTION PANEL
ALT: 21 U/L (ref 0–44)
AST: 38 U/L (ref 15–41)
Albumin: 3.8 g/dL (ref 3.5–5.0)
Alkaline Phosphatase: 37 U/L — ABNORMAL LOW (ref 38–126)
Bilirubin, Direct: 0.2 mg/dL (ref 0.0–0.2)
Indirect Bilirubin: 0.6 mg/dL (ref 0.3–0.9)
Total Bilirubin: 0.8 mg/dL (ref 0.3–1.2)
Total Protein: 6.5 g/dL (ref 6.5–8.1)

## 2020-01-02 LAB — TROPONIN I (HIGH SENSITIVITY)
Troponin I (High Sensitivity): 8 ng/L (ref <18)
Troponin I (High Sensitivity): 14 ng/L (ref <18)

## 2020-01-02 LAB — CBC
HCT: 43.8 % (ref 39.0–52.0)
Hemoglobin: 13.8 g/dL (ref 13.0–17.0)
MCH: 30.4 pg (ref 26.0–34.0)
MCHC: 31.5 g/dL (ref 30.0–36.0)
MCV: 96.5 fL (ref 80.0–100.0)
Platelets: 180 10*3/uL (ref 150–400)
RBC: 4.54 MIL/uL (ref 4.22–5.81)
RDW: 14.6 % (ref 11.5–15.5)
WBC: 10 10*3/uL (ref 4.0–10.5)
nRBC: 0 % (ref 0.0–0.2)

## 2020-01-02 LAB — LIPASE, BLOOD: Lipase: 1755 U/L — ABNORMAL HIGH (ref 11–51)

## 2020-01-02 LAB — BASIC METABOLIC PANEL
Anion gap: 8 (ref 5–15)
BUN: 23 mg/dL (ref 8–23)
CO2: 27 mmol/L (ref 22–32)
Calcium: 8.7 mg/dL — ABNORMAL LOW (ref 8.9–10.3)
Chloride: 102 mmol/L (ref 98–111)
Creatinine, Ser: 1.17 mg/dL (ref 0.61–1.24)
GFR, Estimated: 59 mL/min — ABNORMAL LOW (ref >60)
Glucose, Bld: 140 mg/dL — ABNORMAL HIGH (ref 70–99)
Potassium: 3.7 mmol/L (ref 3.5–5.1)
Sodium: 137 mmol/L (ref 135–145)

## 2020-01-02 LAB — PROTIME-INR
INR: 1 (ref 0.8–1.2)
Prothrombin Time: 12.9 seconds (ref 11.4–15.2)

## 2020-01-02 MED ORDER — UMECLIDINIUM BROMIDE 62.5 MCG/INH IN AEPB
1.0000 | INHALATION_SPRAY | Freq: Every day | RESPIRATORY_TRACT | Status: DC
  Administered 2020-01-03: 1 via RESPIRATORY_TRACT
  Filled 2020-01-02: qty 7

## 2020-01-02 MED ORDER — FLUTICASONE-UMECLIDIN-VILANT 100-62.5-25 MCG/INH IN AEPB: 1.0000 | INHALATION_SPRAY | Freq: Every day | RESPIRATORY_TRACT | Status: DC

## 2020-01-02 MED ORDER — IPRATROPIUM BROMIDE 0.02 % IN SOLN
0.5000 mg | Freq: Four times a day (QID) | RESPIRATORY_TRACT | Status: DC
  Administered 2020-01-02 (×2): 0.5 mg via RESPIRATORY_TRACT
  Filled 2020-01-02 (×2): qty 2.5

## 2020-01-02 MED ORDER — GADOBUTROL 1 MMOL/ML IV SOLN
7.0000 mL | Freq: Once | INTRAVENOUS | Status: AC | PRN
  Administered 2020-01-02: 7 mL via INTRAVENOUS

## 2020-01-02 MED ORDER — LORAZEPAM 0.5 MG PO TABS
0.5000 mg | ORAL_TABLET | Freq: Every day | ORAL | Status: DC
Start: 2020-01-02 — End: 2020-01-06
  Administered 2020-01-02 – 2020-01-05 (×4): 0.5 mg via ORAL
  Filled 2020-01-02 (×4): qty 1

## 2020-01-02 MED ORDER — TERAZOSIN HCL 5 MG PO CAPS
5.0000 mg | ORAL_CAPSULE | Freq: Every day | ORAL | Status: DC
Start: 2020-01-02 — End: 2020-01-06
  Administered 2020-01-02 – 2020-01-05 (×4): 5 mg via ORAL
  Filled 2020-01-02 (×5): qty 1

## 2020-01-02 MED ORDER — HYDROMORPHONE HCL 1 MG/ML IJ SOLN
0.5000 mg | INTRAMUSCULAR | Status: DC | PRN
  Administered 2020-01-02 (×2): 0.5 mg via INTRAVENOUS
  Filled 2020-01-02: qty 1
  Filled 2020-01-02: qty 0.5

## 2020-01-02 MED ORDER — MEMANTINE HCL 10 MG PO TABS
5.0000 mg | ORAL_TABLET | Freq: Three times a day (TID) | ORAL | Status: DC
  Administered 2020-01-02 – 2020-01-06 (×11): 5 mg via ORAL
  Filled 2020-01-02 (×12): qty 1

## 2020-01-02 MED ORDER — FLUTICASONE FUROATE-VILANTEROL 100-25 MCG/INH IN AEPB
1.0000 | INHALATION_SPRAY | Freq: Every day | RESPIRATORY_TRACT | Status: DC
  Administered 2020-01-03 – 2020-01-06 (×4): 1 via RESPIRATORY_TRACT
  Filled 2020-01-02: qty 28

## 2020-01-02 MED ORDER — FLUTICASONE PROPIONATE 50 MCG/ACT NA SUSP
2.0000 | Freq: Every day | NASAL | Status: DC
  Administered 2020-01-02 – 2020-01-06 (×5): 2 via NASAL
  Filled 2020-01-02 (×3): qty 16

## 2020-01-02 MED ORDER — FENTANYL CITRATE (PF) 100 MCG/2ML IJ SOLN
INTRAMUSCULAR | Status: AC
  Administered 2020-01-02: 100 ug via INTRAVENOUS
  Filled 2020-01-02: qty 2

## 2020-01-02 MED ORDER — IPRATROPIUM BROMIDE 0.02 % IN SOLN
0.5000 mg | Freq: Three times a day (TID) | RESPIRATORY_TRACT | Status: DC
  Administered 2020-01-03 – 2020-01-06 (×10): 0.5 mg via RESPIRATORY_TRACT
  Filled 2020-01-02 (×10): qty 2.5

## 2020-01-02 MED ORDER — FENTANYL CITRATE (PF) 100 MCG/2ML IJ SOLN: 100.0000 ug | Freq: Once | INTRAMUSCULAR | Status: AC

## 2020-01-02 MED ORDER — PIPERACILLIN-TAZOBACTAM 3.375 G IVPB 30 MIN
3.3750 g | Freq: Once | INTRAVENOUS | Status: AC
  Administered 2020-01-02: 3.375 g via INTRAVENOUS
  Filled 2020-01-02: qty 50

## 2020-01-02 MED ORDER — SODIUM CHLORIDE 0.9 % IV BOLUS
1000.0000 mL | Freq: Once | INTRAVENOUS | Status: AC
  Administered 2020-01-02: 1000 mL via INTRAVENOUS

## 2020-01-02 MED ORDER — HYDROMORPHONE HCL 1 MG/ML IJ SOLN
1.0000 mg | Freq: Once | INTRAMUSCULAR | Status: AC
  Administered 2020-01-02: 1 mg via INTRAVENOUS
  Filled 2020-01-02: qty 1

## 2020-01-02 MED ORDER — PIPERACILLIN-TAZOBACTAM 3.375 G IVPB
3.3750 g | Freq: Three times a day (TID) | INTRAVENOUS | Status: DC
  Administered 2020-01-02 – 2020-01-06 (×12): 3.375 g via INTRAVENOUS
  Filled 2020-01-02 (×12): qty 50

## 2020-01-02 MED ORDER — DONEPEZIL HCL 5 MG PO TABS
5.0000 mg | ORAL_TABLET | Freq: Every day | ORAL | Status: DC
  Administered 2020-01-02 – 2020-01-05 (×4): 5 mg via ORAL
  Filled 2020-01-02 (×5): qty 1

## 2020-01-02 MED ORDER — ENOXAPARIN SODIUM 40 MG/0.4ML ~~LOC~~ SOLN
40.0000 mg | SUBCUTANEOUS | Status: DC
Start: 2020-01-02 — End: 2020-01-06
  Administered 2020-01-02 – 2020-01-05 (×3): 40 mg via SUBCUTANEOUS
  Filled 2020-01-02 (×3): qty 0.4

## 2020-01-02 MED ORDER — FINASTERIDE 5 MG PO TABS
5.0000 mg | ORAL_TABLET | Freq: Every day | ORAL | Status: DC
  Administered 2020-01-02 – 2020-01-06 (×5): 5 mg via ORAL
  Filled 2020-01-02 (×7): qty 1

## 2020-01-02 MED ORDER — ALBUTEROL SULFATE HFA 108 (90 BASE) MCG/ACT IN AERS: 1.0000 | INHALATION_SPRAY | RESPIRATORY_TRACT | Status: DC | PRN

## 2020-01-02 MED ORDER — SODIUM CHLORIDE 0.9 % IV SOLN: INTRAVENOUS | Status: DC

## 2020-01-02 MED ORDER — HYDROMORPHONE HCL 1 MG/ML IJ SOLN: 1.0000 mg | INTRAMUSCULAR | Status: DC | PRN

## 2020-01-02 MED ORDER — IOHEXOL 350 MG/ML SOLN
100.0000 mL | Freq: Once | INTRAVENOUS | Status: AC | PRN
  Administered 2020-01-02: 150 mL via INTRAVENOUS

## 2020-01-02 NOTE — ED Notes (Signed)
Gone to CT

## 2020-01-02 NOTE — ED Notes (Signed)
Report given to Janelle, RN.

## 2020-01-02 NOTE — ED Triage Notes (Addendum)
Per EMS, "called out in reference to chest pain. cbg wnl. EMS states pt was recently seen by cardiology. Pt spouse states pt has history of gerd, dementia."Ems administered aspirin x1, nitro x1. Pt does not have dentures in and was only able to chew x1 aspirin successfully. Pt alert to self and place. Pt reports RUQ/epigastric pain. EMS reports LBBB noted on ekg en route.

## 2020-01-02 NOTE — Progress Notes (Signed)
Case discussed with Dr. Laural Golden of GI who feels that negative studies probably would not completely rule out microlithiasis or stones not found by imaging Agrees that surgical intervention may be possibility family is willing and if patient's hemodynamically and optimized for surgery (from my perspective is as is quite healthy otherwise) He is available for ERCP or other collaborative intervention if needed but I have not asked for a  consult Appreciate care   Verneita Griffes, MD [facility] 4:26 PM

## 2020-01-02 NOTE — Consult Note (Addendum)
I was present with the medical student for this service. I personally verified the history of present illness, performed the physical exam, and made the plan for this encounter. I have verified the medical student's documentation and made modifications where appropriately. I have personally documented in my own words a brief history, physical, and plan below.     Patient with findings on CT concerning for cholecystitis with thickening and edema. Labs unusual with lipase elevation and normal LFTs otherwise, no leukocytosis. MR done for further evaluation. Pancreatic cyst versus side branch IPMN and acalculous cholecystitis. Patient not reported to have recent illness or hospitalization but does have dementia. He lives at home independently. His son is at the bedside.  He complains of RUQ pain and pain into right shoulder.   Soft, tender RUQ area.  Personally reviewed imaging- thickened wall and fluid around, no stones on imaging   NAD No teeth Neck supple Normal work of breathing RRR Soft, tender RUQ with deep palpation Moves all extremities, no edema Oriented to self and place only  MRI with acalculous cholecystitis but labs are unusual with lipase elevation only. High risk surgical candidate given age and overall health and dementia.   -IV antibiotics for acalculous cholecystitis, if needed cholecystostomy tube would consider, but hold for now -Diet as tolerate -Would get GI input to ensure not missing anything, thoughts on pancreatic lesion etc  -Discussed with Dr. Verlon Au and son at bedside.  Curlene Labrum, MD [facility] [address] [place], [place] [address] [phone] (office)    [facility] Consult  Reason for Consult: Lipase Elevation/ cholecystitis  Referring Physician: ED  Chief Complaint    Chest Pain      HPI: Carlos Brooks is a 86 y.o. male presenting w/ 1 day of back, right shoulder, and chest pain  aggravated by movement. History limited by pt's dementia and difficulty communicating as pt is not wearing dentures. Pt endorses past but denies current abdominal pain. When asked about nausea, vomiting, and BMs, pt's answers are difficult to understand.  Pt's son present and able to provide supplemental hx.  In addition to items below, pt and pt's son report hx of dementia and remote hx appendectomy.   Past Medical History:  Diagnosis Date  . BPH (benign prostatic hyperplasia)   . Cancer ([place])    skin  . Cataract   . COPD (chronic obstructive pulmonary disease) ([place])   . Generalized headaches   . GERD (gastroesophageal reflux disease)   . Hiatal hernia   . Hyperlipidemia     Past Surgical History:  Procedure Laterality Date  . EYE SURGERY    . HERNIA REPAIR     x 3, inguinal   . skin cancer removal     head    Family History  Problem Relation Age of Onset  . Cancer Mother        Carlos Brooks  . Cancer Father        lung  . Heart attack Brother 62       Not clear details  . Heart attack Son 56       Died with an MI  . Heart disease Son   . Cancer Brother        liver  . Heart attack Brother 88  . Aneurysm Brother   . Hypertension Son   . Obesity Son   . Asthma Son     Social History   Tobacco Use  . Smoking status:  Former Smoker    Quit date: 04/13/1993    Years since quitting: 26.7  . Smokeless tobacco: Never Used  Vaping Use  . Vaping Use: Never used  Substance Use Topics  . Alcohol use: No  . Drug use: No    Medications: I have reviewed the patient's current medications. Current Facility-Administered Medications  Medication Dose Route Frequency Provider Last Rate Last Admin  . 0.9 %  sodium chloride infusion   Intravenous Continuous Nita Sells, MD 75 mL/hr at 01/02/20 1732 [employer] at 01/02/20 1732  . albuterol (VENTOLIN HFA) 108 (90 Base) MCG/ACT inhaler 1 puff  1 puff Inhalation Q4H PRN Nita Sells, MD      . donepezil  (ARICEPT) tablet 5 mg  5 mg Oral QHS Samtani, Jai-Gurmukh, MD      . enoxaparin (LOVENOX) injection 40 mg  40 mg Subcutaneous Q24H Nita Sells, MD   40 mg at 01/02/20 1434  . finasteride (PROSCAR) tablet 5 mg  5 mg Oral Daily Nita Sells, MD   5 mg at 01/02/20 1754  . fluticasone (FLONASE) 50 MCG/ACT nasal spray 2 spray  2 spray Each Nare Daily Nita Sells, MD   2 spray at 01/02/20 1754  . fluticasone furoate-vilanterol (BREO ELLIPTA) 100-25 MCG/INH 1 puff  1 puff Inhalation Daily Samtani, Jai-Gurmukh, MD      . HYDROmorphone (DILAUDID) injection 0.5 mg  0.5 mg Intravenous Q3H PRN Nita Sells, MD   0.5 mg at 01/02/20 1434  . ipratropium (ATROVENT) nebulizer solution 0.5 mg  0.5 mg Nebulization Q6H Nita Sells, MD   0.5 mg at 01/02/20 1504  . LORazepam (ATIVAN) tablet 0.5 mg  0.5 mg Oral QHS Nita Sells, MD      . memantine [facility]) tablet 5 mg  5 mg Oral TID Nita Sells, MD   5 mg at 01/02/20 1754  . piperacillin-tazobactam (ZOSYN) IVPB 3.375 g  3.375 g Intravenous Q8H Samtani, Jai-Gurmukh, MD      . terazosin (HYTRIN) capsule 5 mg  5 mg Oral QHS Samtani, Jai-Gurmukh, MD      . umeclidinium bromide (INCRUSE ELLIPTA) 62.5 MCG/INH 1 puff  1 puff Inhalation Daily Nita Sells, MD       Allergies  Allergen Reactions  . Clarithromycin   . Crestor [Rosuvastatin Calcium]   . Lipitor [Atorvastatin Calcium]   . Niaspan [Niacin Er]   . Pneumovax [Pneumococcal Polysaccharide Vaccine]     Arm swelling and rash  . Pravachol   . Ranitidine Hcl   . Simvastatin    Physical Exam: HEENT: Normocephalic, atraumatic Cardiac: RRR, Normal S1 and S2, no M/R/G Respiratory: Normal WOB, course breath sounds bilaterally Abdominal: No distension, rebound, or guarding noted on exam. Bowel sounds appreciated. Minimal TTP. Negative Murphy's sign. Neuro: Alert and oriented to person and place, not to time or situation  ROS:  Pertinent items are  noted in HPI.   Brooks pressure (!) 165/69, pulse 61, temperature 97.9 F (36.6 C), temperature source Oral, resp. rate (!) 22, height 5\' 4"  (1.626 m), weight 63.5 kg, SpO2 91 %. There is minimal tenderness.  Results: Results for orders placed or performed during the hospital encounter of 01/02/20 (from the past 48 hour(s))  Basic metabolic panel     Status: Abnormal   Collection Time: 01/02/20  9:09 AM  Result Value Ref Range   Sodium 137 135 - 145 mmol/L   Potassium 3.7 3.5 - 5.1 mmol/L   Chloride 102 98 - 111 mmol/L   CO2 27  22 - 32 mmol/L   Glucose, Bld 140 (H) 70 - 99 mg/dL    Comment: Glucose reference range applies only to samples taken after fasting for at least 8 hours.   BUN 23 8 - 23 mg/dL   Creatinine, Ser 1.17 0.61 - 1.24 mg/dL   Calcium 8.7 (L) 8.9 - 10.3 mg/dL   GFR, Estimated 59 (L) >60 mL/min    Comment: (NOTE) Calculated using the CKD-EPI Creatinine Equation (2021)    Anion gap 8 5 - 15    Comment: Performed at [facility], [address], [place], [place] [address]  CBC     Status: None   Collection Time: 01/02/20  9:09 AM  Result Value Ref Range   WBC 10.0 4.0 - 10.5 K/uL   RBC 4.54 4.22 - 5.81 MIL/uL   Hemoglobin 13.8 13.0 - 17.0 g/dL   HCT 43.8 39 - 52 %   MCV 96.5 80.0 - 100.0 fL   MCH 30.4 26.0 - 34.0 pg   MCHC 31.5 30.0 - 36.0 g/dL   RDW 14.6 11.5 - 15.5 %   Platelets 180 150 - 400 K/uL   nRBC 0.0 0.0 - 0.2 %    Comment: Performed at [facility], [address], [place], [place] [address]  Troponin I (High Sensitivity)     Status: None   Collection Time: 01/02/20  9:09 AM  Result Value Ref Range   Troponin I (High Sensitivity) 8 <18 ng/L    Comment: (NOTE) Elevated high sensitivity troponin I (hsTnI) values and significant  changes across serial measurements may suggest ACS but many other  chronic and acute conditions are known to elevate hsTnI results.  Refer to the "Links" section for chest pain algorithms and additional  guidance. Performed  at [facility], [address] [place], [place] [address]   Protime-INR     Status: None   Collection Time: 01/02/20  9:09 AM  Result Value Ref Range   Prothrombin Time 12.9 11.4 - 15.2 seconds   INR 1.0 0.8 - 1.2    Comment: (NOTE) INR goal varies based on device and disease states. Performed at [facility], [address] [place], [place] [address]   Type and screen [facility]     Status: None   Collection Time: 01/02/20  9:09 AM  Result Value Ref Range   ABO/RH(D) O POS    Antibody Screen NEG    Sample Expiration      01/05/2020,2359 Performed at [facility], [address], [place], [place] [address]   Hepatic function panel     Status: Abnormal   Collection Time: 01/02/20  9:18 AM  Result Value Ref Range   Total Protein 6.5 6.5 - 8.1 g/dL   Albumin 3.8 3.5 - 5.0 g/dL   AST 38 15 - 41 U/L   ALT 21 0 - 44 U/L   Alkaline Phosphatase 37 (L) 38 - 126 U/L   Total Bilirubin 0.8 0.3 - 1.2 mg/dL   Bilirubin, Direct 0.2 0.0 - 0.2 mg/dL   Indirect Bilirubin 0.6 0.3 - 0.9 mg/dL    Comment: Performed at [facility], [address] [place], [place] [address]  Lipase, Brooks     Status: Abnormal   Collection Time: 01/02/20  9:18 AM  Result Value Ref Range   Lipase 1,755 (H) 11 - 51 U/L    Comment: RESULTS CONFIRMED BY MANUAL DILUTION Performed at [facility], [address] [place], [place] [address]   Troponin  I (High Sensitivity)     Status: None   Collection Time: 01/02/20 10:52 AM  Result Value Ref Range   Troponin I (High Sensitivity) 14 <18 ng/L    Comment: (NOTE) Elevated high sensitivity troponin I (hsTnI) values and significant  changes across serial measurements may suggest ACS but many other  chronic and acute conditions are known to elevate hsTnI results.  Refer to the "Links" section for chest pain algorithms and additional  guidance. Performed at [facility], [address], [place], [place] [address]   Respiratory Panel by RT PCR (Flu A&B, Covid) -  Nasopharyngeal Swab     Status: None   Collection Time: 01/02/20 11:59 AM   Specimen: Nasopharyngeal Swab  Result Value Ref Range   SARS Coronavirus 2 by RT PCR NEGATIVE NEGATIVE    Comment: (NOTE) SARS-CoV-2 target nucleic acids are NOT DETECTED.  The SARS-CoV-2 RNA is generally detectable in upper respiratoy specimens during the acute phase of infection. The lowest concentration of SARS-CoV-2 viral copies this assay can detect is 131 copies/mL. A negative result does not preclude SARS-Cov-2 infection and should not be used as the sole basis for treatment or other patient management decisions. A negative result may occur with  improper specimen collection/handling, submission of specimen other than nasopharyngeal swab, presence of viral mutation(s) within the areas targeted by this assay, and inadequate number of viral copies (<131 copies/mL). A negative result must be combined with clinical observations, patient history, and epidemiological information. The expected result is Negative.  Fact Sheet for Patients:  PinkCheek.be  Fact Sheet for Healthcare Providers:  GravelBags.it  This test is no t yet approved or cleared by the [place] FDA and  has been authorized for detection and/or diagnosis of SARS-CoV-2 by FDA under an Emergency Use Authorization (EUA). This EUA will remain  in effect (meaning this test can be used) for the duration of the COVID-19 declaration under Section 564(b)(1) of the Act, 21 U.S.C. section 360bbb-3(b)(1), unless the authorization is terminated or revoked sooner.     Influenza A by PCR NEGATIVE NEGATIVE   Influenza B by PCR NEGATIVE NEGATIVE    Comment: (NOTE) The Xpert Xpress SARS-CoV-2/FLU/RSV assay is intended as an aid in  the diagnosis of influenza from Nasopharyngeal swab specimens and  should not be used as a sole basis for treatment. Nasal washings and  aspirates are  unacceptable for Xpert Xpress SARS-CoV-2/FLU/RSV  testing.  Fact Sheet for Patients: PinkCheek.be  Fact Sheet for Healthcare Providers: GravelBags.it  This test is not yet approved or cleared by the [place] FDA and  has been authorized for detection and/or diagnosis of SARS-CoV-2 by  FDA under an Emergency Use Authorization (EUA). This EUA will remain  in effect (meaning this test can be used) for the duration of the  Covid-19 declaration under Section 564(b)(1) of the Act, 21  U.S.C. section 360bbb-3(b)(1), unless the authorization is  terminated or revoked. Performed at [facility], [address], [place], [place] [address]     MR Abdomen W or Wo Contrast  Result Date: 01/02/2020 CLINICAL DATA:  Right upper quadrant pain 2 weeks. CT and examinations. EXAM: MRI ABDOMEN WITHOUT AND WITH CONTRAST (INCLUDING MRCP) TECHNIQUE: Multiplanar multisequence MR imaging of the abdomen was performed both before and after the administration of intravenous contrast. Heavily T2-weighted images of the biliary and pancreatic ducts were obtained, and three-dimensional MRCP images were rendered by post processing. CONTRAST:  21mL GADAVIST GADOBUTROL 1 MMOL/ML IV SOLN COMPARISON:  CT  and ultrasound examinations 01/02/1999 FINDINGS: Examination is quite limited due to respiratory motion. The postcontrast images and particularly are limited. Lower chest: The lung bases are grossly6 clear. No pulmonary lesions or pleural effusions. The heart is limits of normal in size but no pericardial effusion. Hepatobiliary: No hepatic lesions are identified. No intrahepatic biliary dilatation. Small amount of perihepatic fluid is noted. The gallbladder is moderately distended and demonstrates wall thickening, pericholecystic inflammatory changes and fluid. This also mild inflammatory changes involving the adjacent right hepatic lobe with a small interposed fluid  collection. No gallstones are identified. Normal caliber and course of the common bile duct. No common bile duct stones are identified. Pancreas: Age related pancreatic atrophy is noted. There are 2 small cystic lesions in the pancreas there is a 3 mm cyst in the head body junction region and a 10 mm cyst in the pancreatic tail. These are likely benign postinflammatory cyst or possibly side branch IPMNs. Given the patient's age I do not think any further imaging evaluation or follow-up is necessary. Normal caliber and course of the main pancreatic duct. No findings for pancreatitis. Spleen:  Normal size.  No focal lesions. Adrenals/Urinary Tract: The adrenal glands and kidneys are unremarkable. Stomach/Bowel: Small hiatal hernia noted. The stomach is otherwise unremarkable. The duodenum, visualized small bowel and visualized colon are unremarkable. There is some fluid/inflammation surrounding the second portion of the duodenum likely due to the gallbladder process. Vascular/Lymphatic: The aorta is normal in caliber. No dissection. The branch vessels are patent. The major venous structures are patent. No mesenteric or retroperitoneal mass or adenopathy. Other:  No ascites or abdominal wall hernia. Musculoskeletal: No significant bony findings. IMPRESSION: 1. Moderately distended gallbladder with wall thickening, pericholecystic inflammatory changes and fluid. This is most likely due to acute acalculous cholecystitis. No common bile duct stones. Nuclear medicine hepatobiliary scan may be helpful for further evaluation. 2. Normal pancreaticobiliary tree. 3. Small pancreatic cystic lesions measuring up to 10 mm. These are likely benign postinflammatory cyst or possibly side branch IPMNs. Given the patient's age I do not think any further imaging evaluation or follow-up is necessary. 4. Small hiatal hernia. Electronically Signed   By: Marijo Sanes M.D.   On: 01/02/2020 14:39   MR 3D Recon At Scanner  Result Date:  01/02/2020 CLINICAL DATA:  Right upper quadrant pain 2 weeks. CT and examinations. EXAM: MRI ABDOMEN WITHOUT AND WITH CONTRAST (INCLUDING MRCP) TECHNIQUE: Multiplanar multisequence MR imaging of the abdomen was performed both before and after the administration of intravenous contrast. Heavily T2-weighted images of the biliary and pancreatic ducts were obtained, and three-dimensional MRCP images were rendered by post processing. CONTRAST:  34mL GADAVIST GADOBUTROL 1 MMOL/ML IV SOLN COMPARISON:  CT and ultrasound examinations 01/02/1999 FINDINGS: Examination is quite limited due to respiratory motion. The postcontrast images and particularly are limited. Lower chest: The lung bases are grossly6 clear. No pulmonary lesions or pleural effusions. The heart is limits of normal in size but no pericardial effusion. Hepatobiliary: No hepatic lesions are identified. No intrahepatic biliary dilatation. Small amount of perihepatic fluid is noted. The gallbladder is moderately distended and demonstrates wall thickening, pericholecystic inflammatory changes and fluid. This also mild inflammatory changes involving the adjacent right hepatic lobe with a small interposed fluid collection. No gallstones are identified. Normal caliber and course of the common bile duct. No common bile duct stones are identified. Pancreas: Age related pancreatic atrophy is noted. There are 2 small cystic lesions in the pancreas there is a 3  mm cyst in the head body junction region and a 10 mm cyst in the pancreatic tail. These are likely benign postinflammatory cyst or possibly side branch IPMNs. Given the patient's age I do not think any further imaging evaluation or follow-up is necessary. Normal caliber and course of the main pancreatic duct. No findings for pancreatitis. Spleen:  Normal size.  No focal lesions. Adrenals/Urinary Tract: The adrenal glands and kidneys are unremarkable. Stomach/Bowel: Small hiatal hernia noted. The stomach is  otherwise unremarkable. The duodenum, visualized small bowel and visualized colon are unremarkable. There is some fluid/inflammation surrounding the second portion of the duodenum likely due to the gallbladder process. Vascular/Lymphatic: The aorta is normal in caliber. No dissection. The branch vessels are patent. The major venous structures are patent. No mesenteric or retroperitoneal mass or adenopathy. Other:  No ascites or abdominal wall hernia. Musculoskeletal: No significant bony findings. IMPRESSION: 1. Moderately distended gallbladder with wall thickening, pericholecystic inflammatory changes and fluid. This is most likely due to acute acalculous cholecystitis. No common bile duct stones. Nuclear medicine hepatobiliary scan may be helpful for further evaluation. 2. Normal pancreaticobiliary tree. 3. Small pancreatic cystic lesions measuring up to 10 mm. These are likely benign postinflammatory cyst or possibly side branch IPMNs. Given the patient's age I do not think any further imaging evaluation or follow-up is necessary. 4. Small hiatal hernia. Electronically Signed   By: Marijo Sanes M.D.   On: 01/02/2020 14:39   CT Angio Chest/Abd/Pel for Dissection W and/or Wo Contrast  Result Date: 01/02/2020 CLINICAL DATA:  Chest and abdominal pain EXAM: CT ANGIOGRAPHY CHEST, ABDOMEN AND PELVIS TECHNIQUE: Non-contrast CT of the chest was initially obtained. Multidetector CT imaging through the chest, abdomen and pelvis was performed using the standard protocol during bolus administration of intravenous contrast. Multiplanar reconstructed images and MIPs were obtained and reviewed to evaluate the vascular anatomy. CONTRAST:  141mL OMNIPAQUE IOHEXOL 350 MG/ML SOLN COMPARISON:  Chest CT April 25, 2009 FINDINGS: CTA CHEST FINDINGS Cardiovascular: There is no intramural hematoma on noncontrast enhanced study. There is no evident mediastinal hematoma. There is no thoracic aortic aneurysm or dissection. There are  scattered foci of calcification in visualized great vessels. There is aortic atherosclerosis as well as scattered foci of coronary artery calcification. There is no appreciable pericardial effusion or pericardial thickening. No demonstrable pulmonary embolus. Mediastinum/Nodes: Thyroid appears normal. There is no appreciable thoracic adenopathy. There is a focal hiatal hernia. Lungs/Pleura: There is scarring in each lung apex. More severe on the right than on the left. A degree of superimposed infiltrate in the apical segment of the right upper lobe cannot be excluded. There are scattered areas of scarring and bullous disease in the upper lobe regions. There is patchy bibasilar atelectatic change. No appreciable pleural effusions. There is mild bronchiectatic change in the right middle lobe. Musculoskeletal: No blastic or lytic bone lesions. No appreciable fracture or dislocation. No chest wall lesions. Review of the MIP images confirms the above findings. CTA ABDOMEN AND PELVIS FINDINGS VASCULAR Aorta: No evident abdominal aortic aneurysm or dissection. There is atherosclerotic calcification throughout the course of the aorta without hemodynamically significant obstruction. Celiac: The celiac artery and its branches show no evident dissection or aneurysm. There is moderate plaque at the origin of the celiac artery. Other major branches of the celiac artery appear widely patent. SMA: Superior mesenteric artery and its branches are patent with slight atherosclerotic plaque at the origin of the superior mesenteric artery. Major superior mesenteric artery branches elsewhere are  patent. No aneurysm or dissection involving the superior mesenteric artery or its branches. Renals: There is a single renal artery on each side. There is atherosclerotic calcification at the origin the right renal artery causing approximately 70% diameter stenosis. There is 75-80% diameter stenosis at the origin of the left renal artery. No  aneurysm or dissection involving the renal arteries and their branches. No fibromuscular dysplasia evident. IMA: Inferior mesenteric artery is patent although there is moderate atherosclerotic plaque at the origin of this vessel. More distally, branches of the inferior mesenteric artery are patent. No aneurysm or dissection involving these branches. Inflow: There is atherosclerotic plaque in both common iliac arteries with several areas of 40-50% diameter stenosis in these arteries. There are foci of atherosclerotic plaque in both internal iliac and external iliac arteries with moderate least severe narrowing at the origin of the right and left hypogastric arteries but less than 50% diameter stenosis in the external iliac arteries. There is mild atherosclerotic plaque in each common femoral artery with less than 50% diameter stenosis. There is atherosclerotic plaque in each proximal superficial femoral artery with areas of 50-60% diameter stenosis. No aneurysm or dissection involving these vessels. Veins: No obvious venous abnormality within the limitations of this arterial phase study. Review of the MIP images confirms the above findings. NON-VASCULAR Hepatobiliary: Fatty infiltration is noted in the liver. There is a probable cyst near the dome of the liver on the right measuring 6 mm. Note that the wall of the gallbladder appears mildly thickened and equivocally edematous. No appreciable biliary duct dilatation evident. Pancreas: There is no pancreatic mass or inflammatory focus. Spleen: No splenic lesions are evident. Adrenals/Urinary Tract: Adrenals bilaterally appear normal. There is no appreciable renal mass or hydronephrosis on either side. No appreciable renal or ureteral calculus evident on either side. Note that contrast within the collecting systems and ureters may mask smaller calculi. Urinary bladder wall thickness is within normal limits. Stomach/Bowel: There are sigmoid diverticula without evident  diverticulitis. There is no appreciable bowel wall or mesenteric thickening. No evident bowel obstruction. The terminal ileum appears normal. There is no appreciable free air or portal venous air. Lymphatic: There is no evident adenopathy in the abdomen or pelvis. Reproductive: Prostate and seminal vesicles are normal in size and configuration. Other: Appendix appears normal. No appreciable abscess or ascites in the abdomen or pelvis. Musculoskeletal: There is degenerative change in the thoracic spine. There is 4 mm of anterolisthesis of L3 on L4, felt to be due to underlying spondylosis. No evident blastic or lytic bone lesions. No intramuscular or abdominal wall lesions are evident. Review of the MIP images confirms the above findings. IMPRESSION: CT angiogram chest: 1. No demonstrable thoracic aortic aneurysm or dissection. No mediastinal hematoma or intramural lesion. There is aortic atherosclerosis as well as foci of great vessel and coronary artery calcification. 2. Apical scarring bilaterally. A degree of superimposed infiltrate in the apical segment right upper lobe cannot be excluded. Scattered areas of atelectatic change. Bullous disease in the apices noted. Mild bronchiectasis right middle lobe. 3. Hiatal hernia present. 4. No demonstrable thoracic adenopathy. CT angiogram abdomen; CT angiogram pelvis: 1. Atherosclerotic calcification throughout the aorta as well as at multiple sites in mesenteric and pelvic arterial vessels. Atherosclerotic plaque is most notable in the proximal renal arteries as well as in the common and internal iliac arteries bilaterally. No aneurysm or dissection evident in the aorta, major mesenteric, and major pelvic arterial vessels. No evident fibromuscular dysplasia. 2. Gallbladder wall appears  mildly thickened with questionable gallbladder wall edema. This appearance potentially could indicate a degree of acalculus cholecystitis. Ultrasound evaluation of gallbladder advised in  this regard. 3.  Fatty infiltration in liver. 4. Sigmoid diverticula without diverticulitis. No bowel wall thickening or bowel obstruction. No abscess in the abdomen or pelvis. Appendix appears normal. 5. No hydronephrosis on either side. No renal or ureteral calculi evident. Note that small calculi could be masked by intravenous contrast. No urinary bladder wall thickening. Aortic Atherosclerosis (ICD10-I70.0). Electronically Signed   By: Lowella Grip III M.D.   On: 01/02/2020 09:21   US Abdomen Limited RUQ (LIVER/GB)  Result Date: 01/02/2020 CLINICAL DATA:  Biliary colic, abnormal CT results. EXAM: ULTRASOUND ABDOMEN LIMITED RIGHT UPPER QUADRANT COMPARISON:  01/02/2020 CTA chest, abdomen and pelvis. 03/10/2005 CT abdomen report. FINDINGS: Gallbladder: No gallstones visualized. The gallbladder wall is diffusely thickened and edematous measuring 7.6 mm. Pericholecystic free fluid is present. No sonographic Murphy sign noted by sonographer. Common bile duct: Diameter: 3.5 mm Liver: Ill-defined hepatic segment 5 hypervascular region along the gallbladder fossa (ultrasound image 16/65). Mildly increased parenchymal echogenicity. Trace perihepatic free fluid is present. Portal vein is patent on color Doppler imaging with normal direction of Brooks flow towards the liver. Other: Please note that limited patient mobility limits evaluation. IMPRESSION: Ill-defined hepatic segment 5 hypervascular region correlating with heterogenous enhancement seen on prior same day CT (CT 7:100). While this may reflect reactive parenchymal inflammation, MRI abdomen with and without contrast is recommended to exclude neoplasm. Diffuse gallbladder wall inflammation with small volume pericholecystic free fluid. No gallstones or biliary dilatation. Electronically Signed   By: Primitivo Gauze M.D.   On: 01/02/2020 11:30     Assessment & Plan:  Carlos Brooks is a 86 y.o. male who presented to the ED with chest and back  pain and no abdominal complaints during this exam though chart review indicates 2 week hx of RUQ, a benign physical exam, labs significant for elevated lipase, EKG showing no acute cardiac pathology, imaging (CT, MRI, [place]) showing showing no cardiac or aortic pathology, positive for pancreas atrophy with several small cysts with no radiographic signs of pancreatitis, and positive for diffuse gallbladder wall inflammation consistent w/ acalculous cholecystitis.   W/ acalculous cholecystitis would anticipate clear precipitator such as ICU stay and lab abnormalities such as elevated LFTs and bilirubin making pt's presentation with elevation in lipase and no clear precipitator somewhat atypical. Pancreatitis would explain elevated lipase but is inconsistent with MRI findings. Given pt's age and comorbidities, maintain high threshold for surgical intervention.   Plan to admit for pain control, IV antibiotics. Consult GI for continued work-up.   Raliegh Ip 01/02/2020, 4:36 PM

## 2020-01-02 NOTE — H&P (Signed)
HPI  Carlos Brooks CBS:[id number] DOB: December 14, 1926 DOA: 01/02/2020  PCP: [name], FNP   Chief Complaint: abd and chest pain  HPI:  86 year old male prior colon polyps external hemorrhoids 12/2001, esophageal stricture status post dilatation 2006--- was recommended by [name] 04/05/2019 barium esophagram not really a good candidate for scoping BPH Stable right lung nodules 2007 with underlying small liver lesions History of thyroid nodules Unremarkable stress test 96 and 2001 Prior hernia repairs Admission in 2007 secondary to acute infectious colitis not C. Difficile Memory loss versus dementia followed by [name] last seen 09/18/2019 B12 deficiency being ruled out and given Aricept/Namenda in addition--- also suffers from insomnia and has been given lorazepam to see if this helps by PCP 12/11/2019  Review of Systems:  Slightly difficult to obtain secondary to mentation and having received fentanyl and Dilaudid Can tell me he is in the hospital and that the status [place] but cannot tell me count he cannot tell me year  Pertinent +'s: Can tell me that he is in pain in his abdomen and it went between her shoulders Pertinent -"s: no chest pain no diarrhea may have had nausea vomiting according to son yesterday  Secondary to son not being the direct point of contact and patient being a poor historian   ED Course: Given IV fluid to boluses started on fentanyl and then given Dilaudid, CT obtained, Zosyn started, kept n.p.o.   Past Medical History:  Diagnosis Date  . BPH (benign prostatic hyperplasia)   . Cancer ([place])    skin  . Cataract   . COPD (chronic obstructive pulmonary disease) ([place])   . Generalized headaches   . GERD (gastroesophageal reflux disease)   . Hiatal hernia   . Hyperlipidemia    Past Surgical History:  Procedure Laterality Date  . EYE SURGERY    . HERNIA REPAIR     x 3, inguinal   . skin cancer removal     head    reports  that he quit smoking about 26 years ago. He has never used smokeless tobacco. He reports that he does not drink alcohol and does not use drugs.  Mobility: At baseline independent still mows his yard still pretty active lives with his 27 year old wife at home  Allergies  Allergen Reactions  . Clarithromycin   . Crestor [Rosuvastatin Calcium]   . Lipitor [Atorvastatin Calcium]   . Niaspan [Niacin Er]   . Pneumovax [Pneumococcal Polysaccharide Vaccine]     Arm swelling and rash  . Pravachol   . Ranitidine Hcl   . Simvastatin    Family History  Problem Relation Age of Onset  . Cancer Mother        [name]  . Cancer Father        lung  . Heart attack Brother 90       Not clear details  . Heart attack Son 81       Died with an MI  . Heart disease Son   . Cancer Brother        liver  . Heart attack Brother 61  . Aneurysm Brother   . Hypertension Son   . Obesity Son   . Asthma Son    Prior to Admission medications   Medication Sig Start Date End Date Taking? Authorizing Provider  aspirin 81 MG EC tablet Take 81 mg by mouth daily.      [provider]  donepezil (ARICEPT) 5 MG tablet Take 1 tablet (  5 mg total) by mouth at bedtime. 10/16/19 11/15/19  Hassell Done, Mary-Margaret, FNP  Evolocumab (REPATHA SURECLICK) 789 MG/ML SOAJ Inject 140 mg into the skin every 14 (fourteen) days. 02/15/19   Minus Breeding, MD  finasteride (PROSCAR) 5 MG tablet Take 1 tablet (5 mg total) by mouth daily. 10/16/19   Hassell Done, Mary-Margaret, FNP  fluticasone (FLONASE) 50 MCG/ACT nasal spray Place 2 sprays into both nostrils daily. 06/15/19   [name], FNP  Fluticasone-Umeclidin-Vilant (TRELEGY ELLIPTA) 100-62.5-25 MCG/INH AEPB Inhale 1 puff into the lungs daily. 10/16/19   Hassell Done, Mary-Margaret, FNP  guaiFENesin (MUCINEX) 600 MG 12 hr tablet Take 1,200 mg by mouth 2 (two) times daily.    [provider]  levocetirizine (XYZAL) 5 MG tablet Take 0.5 tablets (2.5 mg total) by mouth every evening.  10/02/19   [name], FNP  LORazepam (ATIVAN) 0.5 MG tablet Take nightly as needed for sleep 12/11/19   Hassell Done, Mary-Margaret, FNP  memantine (NAMENDA) 5 MG tablet Take 1 tablet (5 mg total) by mouth in the morning, at noon, and at bedtime. 11/10/19   Hassell Done, Mary-Margaret, FNP  pantoprazole (PROTONIX) 40 MG tablet Take 1 tablet (40 mg total) by mouth daily. 10/16/19   [name], FNP  PROAIR HFA 108 [phone] Base) MCG/ACT inhaler 2 PUFFS EVERY 6 HOURS AS NEEDED FOR WHEEZING OR SHORTNESS OF BREATH 04/26/19   [name], FNP  terazosin (HYTRIN) 5 MG capsule TAKE (1) CAPSULE DAILY 10/16/19   [name], FNP    Physical Exam:  Vitals:   01/02/20 1100 01/02/20 1130  BP: (!) 163/94 (!) 161/78  Pulse: 64 70  Resp: 19 16  Temp:    SpO2: 92% 93%   Awake coherent pleasant  White male looking much younger than stated age  Power 5/5 bilaterally upper extremities follows commands smile symmetric extraocular movements intact raises lower extremities off bed without difficulty reflexes deferred sensory deferred  Chest clear no added sound laterally  S1-S2 slight tachycardia  When I take him off the oxygen he drops to 83%  Abdomen is slightly distended tender in the right upper quadrant  I have personally reviewed following labs and imaging studies  Labs:   WBC 10  Hemoglobin 13.8 platelet 180  BUNs/creatinine 23/1.1  Lipase 1755  AST/ALT 38/21  Troponin trend 8-->14   Imaging studies:   CT angiogram chest showed no dissection apical scarring bilaterally right upper lobe  Abdomen CT showed gallbladder wall edema?  Acalculous cholecystitis  Ultrasound abdomen pelvis showed ill-defined hepatic segment correlating with heterogeneous enhancement?  Parenchymal inflammation-MRI abdomen pelvis recommended to rule out neoplasm  Medical tests:   EKG independently reviewed: Sinus rhythm/sinus bradycardia  Test discussed with performing physician:  Yes  discussed with Dr. Regis Bill  Decision to obtain old records:   Yes  Review and summation of old records:   Yes extensively  Active Problems:   * No active hospital problems. *   Assessment/Plan Acute pancreatitis query cause LFTs are not elevated he has predominant lipase elevation Ultrasound does not confirm cholecystitis or any other pathology but there is concern for malignancy He will get an MRI and we will discuss with family subsequently planning I will place him on a rate of fluid at 75 cc/h cautiously given his advanced age and will monitor his ins and outs Acute hypoxic respiratory failure query cause Possibly secondary to opiate medications Keep on oxygen to maintain sats above 90 and keep n.p.o. for now I am not convinced that the CT  scan showing apical scarring is suggestive of pneumonia Underlying COPD Continue Trelegy Ellipta, proair and Flonase Prior lung, thyroid nodules No further work-up at this juncture Probable colonic polyps and esophageal stricture In January of this year gastroenterologist did not feel that he would warrant specific endoscopy given his advanced age NSAIDs do not have discussed the same with his family we will see what the MRI shows and may involve GI not emergently   Severity of Illness: The appropriate patient status for this patient is INPATIENT. Inpatient status is judged to be reasonable and necessary in order to provide the required intensity of service to ensure the patient's safety. The patient's presenting symptoms, physical exam findings, and initial radiographic and laboratory data in the context of their chronic comorbidities is felt to place them at high risk for further clinical deterioration. Furthermore, it is not anticipated that the patient will be medically stable for discharge from the hospital within 2 midnights of admission. The following factors support the patient status of inpatient.   " The patient's presenting  symptoms include pain. " The worrisome physical exam findings include abd pain and hypoxia. " The initial radiographic and laboratory data are worrisome because of pancreatitis. " The chronic co-morbidities include none.   * I certify that at the point of admission it is my clinical judgment that the patient will require inpatient hospital care spanning beyond 2 midnights from the point of admission due to high intensity of service, high risk for further deterioration and high frequency of surveillance required.*   DVT prophylaxis: Lovenox Code Status: Full although unclear family will clarify Family Communication: Discussed with son Juanda Crumble Consults called: General surgery may see  Time spent: 40 minutes minutes  Verlon Au, MD Jerl Mina my NP partners at night for Care related issues] [facility] --[facility] [employer] [facility] , [email or web address]; password [facility]  01/02/2020, 12:02 PM

## 2020-01-02 NOTE — ED Notes (Signed)
ED TO INPATIENT HANDOFF REPORT  ED Nurse Name and Phone #: Angelica Pou [phone]  S Name/Age/Gender Carlos Brooks 86 y.o. male Room/Bed: APA09/APA09  Code Status   Code Status: Full Code  Home/SNF/Other Home Patient oriented to: self, place, time and situation Is this baseline? Yes   Triage Complete: Triage complete  Chief Complaint Pancreatitis [K85.90]  Triage Note Per EMS, "called out in reference to chest pain. cbg wnl. EMS states pt was recently seen by cardiology. Pt spouse states pt has history of gerd, dementia."Ems administered aspirin x1, nitro x1. Pt does not have dentures in and was only able to chew x1 aspirin successfully. Pt alert to self and place. Pt reports RUQ/epigastric pain. EMS reports LBBB noted on ekg en route.     Allergies Allergies  Allergen Reactions  . Clarithromycin   . Crestor [Rosuvastatin Calcium]   . Lipitor [Atorvastatin Calcium]   . Niaspan [Niacin Er]   . Pneumovax [Pneumococcal Polysaccharide Vaccine]     Arm swelling and rash  . Pravachol   . Ranitidine Hcl   . Simvastatin     Level of Care/Admitting Diagnosis ED Disposition    ED Disposition Condition [facility] Area: [facility] [[id number]]  Level of Care: Telemetry [5]  Covid Evaluation: Asymptomatic Screening Protocol (No Symptoms)  Diagnosis: Pancreatitis [071219]  Admitting Physician: Zortman, [place]  Attending Physician: Nita Sells [phone]  Estimated length of stay: 3 - 4 days  Certification:: I certify this patient will need inpatient services for at least 2 midnights       B Medical/Surgery History Past Medical History:  Diagnosis Date  . BPH (benign prostatic hyperplasia)   . Cancer ([place])    skin  . Cataract   . COPD (chronic obstructive pulmonary disease) ([place])   . Generalized headaches   . GERD (gastroesophageal reflux disease)   . Hiatal hernia   . Hyperlipidemia    Past Surgical History:  Procedure  Laterality Date  . EYE SURGERY    . HERNIA REPAIR     x 3, inguinal   . skin cancer removal     head     A IV Location/Drains/Wounds Patient Lines/Drains/Airways Status    Active Line/Drains/Airways    Name Placement date Placement time Site Days   Peripheral IV 01/02/20 Right Forearm 01/02/20  0812  Forearm  less than 1          Intake/Output Last 24 hours  Intake/Output Summary (Last 24 hours) at 01/02/2020 1431 Last data filed at 01/02/2020 1300 Gross per 24 hour  Intake 2050 ml  Output --  Net 2050 ml    Labs/Imaging Results for orders placed or performed during the hospital encounter of 01/02/20 (from the past 48 hour(s))  Basic metabolic panel     Status: Abnormal   Collection Time: 01/02/20  9:09 AM  Result Value Ref Range   Sodium 137 135 - 145 mmol/L   Potassium 3.7 3.5 - 5.1 mmol/L   Chloride 102 98 - 111 mmol/L   CO2 27 22 - 32 mmol/L   Glucose, Bld 140 (H) 70 - 99 mg/dL    Comment: Glucose reference range applies only to samples taken after fasting for at least 8 hours.   BUN 23 8 - 23 mg/dL   Creatinine, Ser 1.17 0.61 - 1.24 mg/dL   Calcium 8.7 (L) 8.9 - 10.3 mg/dL   GFR, Estimated 59 (L) >60 mL/min    Comment: (NOTE) Calculated using the  CKD-EPI Creatinine Equation (2021)    Anion gap 8 5 - 15    Comment: Performed at [facility], [address], [place], [place] [address]  CBC     Status: None   Collection Time: 01/02/20  9:09 AM  Result Value Ref Range   WBC 10.0 4.0 - 10.5 K/uL   RBC 4.54 4.22 - 5.81 MIL/uL   Hemoglobin 13.8 13.0 - 17.0 g/dL   HCT 43.8 39 - 52 %   MCV 96.5 80.0 - 100.0 fL   MCH 30.4 26.0 - 34.0 pg   MCHC 31.5 30.0 - 36.0 g/dL   RDW 14.6 11.5 - 15.5 %   Platelets 180 150 - 400 K/uL   nRBC 0.0 0.0 - 0.2 %    Comment: Performed at [facility], [address], [place], [place] [address]  Troponin I (High Sensitivity)     Status: None   Collection Time: 01/02/20  9:09 AM  Result Value Ref Range   Troponin I (High  Sensitivity) 8 <18 ng/L    Comment: (NOTE) Elevated high sensitivity troponin I (hsTnI) values and significant  changes across serial measurements may suggest ACS but many other  chronic and acute conditions are known to elevate hsTnI results.  Refer to the "Links" section for chest pain algorithms and additional  guidance. Performed at [facility], [address], [place], [place] [address]   Protime-INR     Status: None   Collection Time: 01/02/20  9:09 AM  Result Value Ref Range   Prothrombin Time 12.9 11.4 - 15.2 seconds   INR 1.0 0.8 - 1.2    Comment: (NOTE) INR goal varies based on device and disease states. Performed at [facility], [address], [place], [place] [address]   Type and screen [facility]     Status: None   Collection Time: 01/02/20  9:09 AM  Result Value Ref Range   ABO/RH(D) O POS    Antibody Screen NEG    Sample Expiration      01/05/2020,2359 Performed at [facility], [address] [place], [place] [address]   Hepatic function panel     Status: Abnormal   Collection Time: 01/02/20  9:18 AM  Result Value Ref Range   Total Protein 6.5 6.5 - 8.1 g/dL   Albumin 3.8 3.5 - 5.0 g/dL   AST 38 15 - 41 U/L   ALT 21 0 - 44 U/L   Alkaline Phosphatase 37 (L) 38 - 126 U/L   Total Bilirubin 0.8 0.3 - 1.2 mg/dL   Bilirubin, Direct 0.2 0.0 - 0.2 mg/dL   Indirect Bilirubin 0.6 0.3 - 0.9 mg/dL    Comment: Performed at [facility], [address] [place], [place] [address]  Lipase, blood     Status: Abnormal   Collection Time: 01/02/20  9:18 AM  Result Value Ref Range   Lipase 1,755 (H) 11 - 51 U/L    Comment: RESULTS CONFIRMED BY MANUAL DILUTION Performed at [facility], [address], [place], [place] [address]   Troponin I (High Sensitivity)     Status: None   Collection Time: 01/02/20 10:52 AM  Result Value Ref Range   Troponin I (High Sensitivity) 14 <18 ng/L    Comment: (NOTE) Elevated high sensitivity troponin I (hsTnI) values and  significant  changes across serial measurements may suggest ACS but many other  chronic and acute conditions are known to elevate hsTnI results.  Refer to the "Links" section for chest  pain algorithms and additional  guidance. Performed at [facility], [address], [place], [place] [address]   Respiratory Panel by RT PCR (Flu A&B, Covid) - Nasopharyngeal Swab     Status: None   Collection Time: 01/02/20 11:59 AM   Specimen: Nasopharyngeal Swab  Result Value Ref Range   SARS Coronavirus 2 by RT PCR NEGATIVE NEGATIVE    Comment: (NOTE) SARS-CoV-2 target nucleic acids are NOT DETECTED.  The SARS-CoV-2 RNA is generally detectable in upper respiratoy specimens during the acute phase of infection. The lowest concentration of SARS-CoV-2 viral copies this assay can detect is 131 copies/mL. A negative result does not preclude SARS-Cov-2 infection and should not be used as the sole basis for treatment or other patient management decisions. A negative result may occur with  improper specimen collection/handling, submission of specimen other than nasopharyngeal swab, presence of viral mutation(s) within the areas targeted by this assay, and inadequate number of viral copies (<131 copies/mL). A negative result must be combined with clinical observations, patient history, and epidemiological information. The expected result is Negative.  Fact Sheet for Patients:  PinkCheek.be  Fact Sheet for Healthcare Providers:  GravelBags.it  This test is no t yet approved or cleared by the [place] FDA and  has been authorized for detection and/or diagnosis of SARS-CoV-2 by FDA under an Emergency Use Authorization (EUA). This EUA will remain  in effect (meaning this test can be used) for the duration of the COVID-19 declaration under Section 564(b)(1) of the Act, 21 U.S.C. section 360bbb-3(b)(1), unless the authorization is terminated  or revoked sooner.     Influenza A by PCR NEGATIVE NEGATIVE   Influenza B by PCR NEGATIVE NEGATIVE    Comment: (NOTE) The Xpert Xpress SARS-CoV-2/FLU/RSV assay is intended as an aid in  the diagnosis of influenza from Nasopharyngeal swab specimens and  should not be used as a sole basis for treatment. Nasal washings and  aspirates are unacceptable for Xpert Xpress SARS-CoV-2/FLU/RSV  testing.  Fact Sheet for Patients: PinkCheek.be  Fact Sheet for Healthcare Providers: GravelBags.it  This test is not yet approved or cleared by the [place] FDA and  has been authorized for detection and/or diagnosis of SARS-CoV-2 by  FDA under an Emergency Use Authorization (EUA). This EUA will remain  in effect (meaning this test can be used) for the duration of the  Covid-19 declaration under Section 564(b)(1) of the Act, 21  U.S.C. section 360bbb-3(b)(1), unless the authorization is  terminated or revoked. Performed at [facility], [address] [place], [place] [address]    CT Angio Chest/Abd/Pel for Dissection W and/or Wo Contrast  Result Date: 01/02/2020 CLINICAL DATA:  Chest and abdominal pain EXAM: CT ANGIOGRAPHY CHEST, ABDOMEN AND PELVIS TECHNIQUE: Non-contrast CT of the chest was initially obtained. Multidetector CT imaging through the chest, abdomen and pelvis was performed using the standard protocol during bolus administration of intravenous contrast. Multiplanar reconstructed images and MIPs were obtained and reviewed to evaluate the vascular anatomy. CONTRAST:  148mL OMNIPAQUE IOHEXOL 350 MG/ML SOLN COMPARISON:  Chest CT April 25, 2009 FINDINGS: CTA CHEST FINDINGS Cardiovascular: There is no intramural hematoma on noncontrast enhanced study. There is no evident mediastinal hematoma. There is no thoracic aortic aneurysm or dissection. There are scattered foci of calcification in visualized great vessels. There is aortic  atherosclerosis as well as scattered foci of coronary artery calcification. There is no appreciable pericardial effusion or pericardial thickening. No demonstrable pulmonary embolus. Mediastinum/Nodes: Thyroid appears normal. There is no  appreciable thoracic adenopathy. There is a focal hiatal hernia. Lungs/Pleura: There is scarring in each lung apex. More severe on the right than on the left. A degree of superimposed infiltrate in the apical segment of the right upper lobe cannot be excluded. There are scattered areas of scarring and bullous disease in the upper lobe regions. There is patchy bibasilar atelectatic change. No appreciable pleural effusions. There is mild bronchiectatic change in the right middle lobe. Musculoskeletal: No blastic or lytic bone lesions. No appreciable fracture or dislocation. No chest wall lesions. Review of the MIP images confirms the above findings. CTA ABDOMEN AND PELVIS FINDINGS VASCULAR Aorta: No evident abdominal aortic aneurysm or dissection. There is atherosclerotic calcification throughout the course of the aorta without hemodynamically significant obstruction. Celiac: The celiac artery and its branches show no evident dissection or aneurysm. There is moderate plaque at the origin of the celiac artery. Other major branches of the celiac artery appear widely patent. SMA: Superior mesenteric artery and its branches are patent with slight atherosclerotic plaque at the origin of the superior mesenteric artery. Major superior mesenteric artery branches elsewhere are patent. No aneurysm or dissection involving the superior mesenteric artery or its branches. Renals: There is a single renal artery on each side. There is atherosclerotic calcification at the origin the right renal artery causing approximately 70% diameter stenosis. There is 75-80% diameter stenosis at the origin of the left renal artery. No aneurysm or dissection involving the renal arteries and their branches. No  fibromuscular dysplasia evident. IMA: Inferior mesenteric artery is patent although there is moderate atherosclerotic plaque at the origin of this vessel. More distally, branches of the inferior mesenteric artery are patent. No aneurysm or dissection involving these branches. Inflow: There is atherosclerotic plaque in both common iliac arteries with several areas of 40-50% diameter stenosis in these arteries. There are foci of atherosclerotic plaque in both internal iliac and external iliac arteries with moderate least severe narrowing at the origin of the right and left hypogastric arteries but less than 50% diameter stenosis in the external iliac arteries. There is mild atherosclerotic plaque in each common femoral artery with less than 50% diameter stenosis. There is atherosclerotic plaque in each proximal superficial femoral artery with areas of 50-60% diameter stenosis. No aneurysm or dissection involving these vessels. Veins: No obvious venous abnormality within the limitations of this arterial phase study. Review of the MIP images confirms the above findings. NON-VASCULAR Hepatobiliary: Fatty infiltration is noted in the liver. There is a probable cyst near the dome of the liver on the right measuring 6 mm. Note that the wall of the gallbladder appears mildly thickened and equivocally edematous. No appreciable biliary duct dilatation evident. Pancreas: There is no pancreatic mass or inflammatory focus. Spleen: No splenic lesions are evident. Adrenals/Urinary Tract: Adrenals bilaterally appear normal. There is no appreciable renal mass or hydronephrosis on either side. No appreciable renal or ureteral calculus evident on either side. Note that contrast within the collecting systems and ureters may mask smaller calculi. Urinary bladder wall thickness is within normal limits. Stomach/Bowel: There are sigmoid diverticula without evident diverticulitis. There is no appreciable bowel wall or mesenteric thickening.  No evident bowel obstruction. The terminal ileum appears normal. There is no appreciable free air or portal venous air. Lymphatic: There is no evident adenopathy in the abdomen or pelvis. Reproductive: Prostate and seminal vesicles are normal in size and configuration. Other: Appendix appears normal. No appreciable abscess or ascites in the abdomen or pelvis. Musculoskeletal: There is  degenerative change in the thoracic spine. There is 4 mm of anterolisthesis of L3 on L4, felt to be due to underlying spondylosis. No evident blastic or lytic bone lesions. No intramuscular or abdominal wall lesions are evident. Review of the MIP images confirms the above findings. IMPRESSION: CT angiogram chest: 1. No demonstrable thoracic aortic aneurysm or dissection. No mediastinal hematoma or intramural lesion. There is aortic atherosclerosis as well as foci of great vessel and coronary artery calcification. 2. Apical scarring bilaterally. A degree of superimposed infiltrate in the apical segment right upper lobe cannot be excluded. Scattered areas of atelectatic change. Bullous disease in the apices noted. Mild bronchiectasis right middle lobe. 3. Hiatal hernia present. 4. No demonstrable thoracic adenopathy. CT angiogram abdomen; CT angiogram pelvis: 1. Atherosclerotic calcification throughout the aorta as well as at multiple sites in mesenteric and pelvic arterial vessels. Atherosclerotic plaque is most notable in the proximal renal arteries as well as in the common and internal iliac arteries bilaterally. No aneurysm or dissection evident in the aorta, major mesenteric, and major pelvic arterial vessels. No evident fibromuscular dysplasia. 2. Gallbladder wall appears mildly thickened with questionable gallbladder wall edema. This appearance potentially could indicate a degree of acalculus cholecystitis. Ultrasound evaluation of gallbladder advised in this regard. 3.  Fatty infiltration in liver. 4. Sigmoid diverticula without  diverticulitis. No bowel wall thickening or bowel obstruction. No abscess in the abdomen or pelvis. Appendix appears normal. 5. No hydronephrosis on either side. No renal or ureteral calculi evident. Note that small calculi could be masked by intravenous contrast. No urinary bladder wall thickening. Aortic Atherosclerosis (ICD10-I70.0). Electronically Signed   By: Lowella Grip III M.D.   On: 01/02/2020 09:21   US Abdomen Limited RUQ (LIVER/GB)  Result Date: 01/02/2020 CLINICAL DATA:  Biliary colic, abnormal CT results. EXAM: ULTRASOUND ABDOMEN LIMITED RIGHT UPPER QUADRANT COMPARISON:  01/02/2020 CTA chest, abdomen and pelvis. 03/10/2005 CT abdomen report. FINDINGS: Gallbladder: No gallstones visualized. The gallbladder wall is diffusely thickened and edematous measuring 7.6 mm. Pericholecystic free fluid is present. No sonographic Murphy sign noted by sonographer. Common bile duct: Diameter: 3.5 mm Liver: Ill-defined hepatic segment 5 hypervascular region along the gallbladder fossa (ultrasound image 16/65). Mildly increased parenchymal echogenicity. Trace perihepatic free fluid is present. Portal vein is patent on color Doppler imaging with normal direction of blood flow towards the liver. Other: Please note that limited patient mobility limits evaluation. IMPRESSION: Ill-defined hepatic segment 5 hypervascular region correlating with heterogenous enhancement seen on prior same day CT (CT 7:100). While this may reflect reactive parenchymal inflammation, MRI abdomen with and without contrast is recommended to exclude neoplasm. Diffuse gallbladder wall inflammation with small volume pericholecystic free fluid. No gallstones or biliary dilatation. Electronically Signed   By: Primitivo Gauze M.D.   On: 01/02/2020 11:30    Pending Labs Unresulted Labs (From admission, onward)          Start     Ordered   01/03/20 0500  Comprehensive metabolic panel  Tomorrow morning,   R        01/02/20 1247    01/03/20 0500  CBC  Tomorrow morning,   R        01/02/20 1247   01/03/20 0500  Protime-INR  Tomorrow morning,   R        01/02/20 1247          Vitals/Pain Today's Vitals   01/02/20 1230 01/02/20 1300 01/02/20 1417 01/02/20 1420  BP: (!) 161/93 (!) 140/57 (!) 151/70  Pulse: 78 77 70   Resp: (!) 22 (!) 23  17  Temp:      TempSrc:      SpO2: 90% 91% 93%   Weight:      Height:      PainSc:        Isolation Precautions No active isolations  Medications Medications  piperacillin-tazobactam (ZOSYN) IVPB 3.375 g (has no administration in time range)  HYDROmorphone (DILAUDID) injection 0.5 mg (has no administration in time range)  terazosin (HYTRIN) capsule 5 mg (has no administration in time range)  donepezil (ARICEPT) tablet 5 mg (has no administration in time range)  LORazepam (ATIVAN) tablet 0.5 mg (has no administration in time range)  memantine (NAMENDA) tablet 5 mg (has no administration in time range)  finasteride (PROSCAR) tablet 5 mg (has no administration in time range)  fluticasone (FLONASE) 50 MCG/ACT nasal spray 2 spray (has no administration in time range)  albuterol (VENTOLIN HFA) 108 (90 Base) MCG/ACT inhaler 1 puff (has no administration in time range)  ipratropium (ATROVENT) nebulizer solution 0.5 mg (has no administration in time range)  enoxaparin (LOVENOX) injection 40 mg (has no administration in time range)  0.9 %  sodium chloride infusion ( Intravenous New Bag/Given 01/02/20 1415)  fluticasone furoate-vilanterol (BREO ELLIPTA) 100-25 MCG/INH 1 puff (1 puff Inhalation Not Given 01/02/20 1322)  umeclidinium bromide (INCRUSE ELLIPTA) 62.5 MCG/INH 1 puff (1 puff Inhalation Not Given 01/02/20 1322)  fentaNYL (SUBLIMAZE) injection 100 mcg (100 mcg Intravenous Given 01/02/20 0828)  sodium chloride 0.9 % bolus 1,000 mL (0 mLs Intravenous Stopped 01/02/20 1153)  iohexol (OMNIPAQUE) 350 MG/ML injection 100 mL (150 mLs Intravenous Contrast Given 01/02/20 0838)   HYDROmorphone (DILAUDID) injection 1 mg (1 mg Intravenous Given 01/02/20 1017)  sodium chloride 0.9 % bolus 1,000 mL (0 mLs Intravenous Stopped 01/02/20 1300)  piperacillin-tazobactam (ZOSYN) IVPB 3.375 g (0 g Intravenous Stopped 01/02/20 1244)  gadobutrol (GADAVIST) 1 MMOL/ML injection 7 mL (7 mLs Intravenous Contrast Given 01/02/20 1329)    Mobility walks with device High fall risk   Focused Assessments    R Recommendations: See Admitting Provider Note  Report given to:   Additional Notes:

## 2020-01-02 NOTE — ED Provider Notes (Signed)
[facility] EMERGENCY DEPARTMENT Provider Note   CSN: [id number] Arrival date & time: 01/02/20  0757     History Chief Complaint  Patient presents with  . Chest Pain    Carlos Brooks is a 86 y.o. male w/ hx of reflux presenting ot the ED with chest pain and back pain.  The patient has some dementia as well.  He arrives with initial concern for anterior chest pain and "maybe reflux" at home per family.  EMS face aspirin x 1 and nitro x 1.  The patient upon my assessment tells me the pain, which was originally epigastric, is now posterior between his shoulders, and severe, stabbing pain.  He's never had this kind of pain before.  It is 10/10.  Nothing makes it better or worse.  He denies hx of chronic neck or back pain.    HPI     Past Medical History:  Diagnosis Date  . BPH (benign prostatic hyperplasia)   . Cancer ([place])    skin  . Cataract   . COPD (chronic obstructive pulmonary disease) ([place])   . Generalized headaches   . GERD (gastroesophageal reflux disease)   . Hiatal hernia   . Hyperlipidemia     Patient Active Problem List   Diagnosis Date Noted  . Pancreatitis 01/02/2020  . Vitamin D deficiency 06/15/2019  . Tremor, coarse 02/17/2019  . Memory loss 02/16/2019  . Bruit 11/30/2018  . Thoracic aorta atherosclerosis ([place]) 10/17/2018  . Primary insomnia 10/17/2018  . Controlled substance agreement signed 10/17/2018  . LBBB (left bundle branch block) 07/21/2017  . Primary osteoarthritis involving multiple joints 08/10/2016  . Back pain 08/10/2016  . Neck pain 08/10/2016  . Neuropathy 10/21/2015  . Aortic atherosclerosis ([place]) 10/21/2015  . Dyspnea 08/07/2015  . Difficulty swallowing solids 06/20/2007  . Allergic rhinitis 04/28/2007  . LUNG NODULE 04/28/2007  . Hyperlipidemia LDL goal <100 03/28/2007  . COPD (chronic obstructive pulmonary disease) with chronic bronchitis ([place]) 03/28/2007  . Gastroesophageal reflux disease without esophagitis 03/28/2007  .  BPH (benign prostatic hyperplasia) 03/28/2007    Past Surgical History:  Procedure Laterality Date  . EYE SURGERY    . HERNIA REPAIR     x 3, inguinal   . skin cancer removal     head       Family History  Problem Relation Age of Onset  . Cancer Mother        [name]  . Cancer Father        lung  . Heart attack Brother 75       Not clear details  . Heart attack Son 27       Died with an MI  . Heart disease Son   . Cancer Brother        liver  . Heart attack Brother 73  . Aneurysm Brother   . Hypertension Son   . Obesity Son   . Asthma Son     Social History   Tobacco Use  . Smoking status: Former Smoker    Quit date: 04/13/1993    Years since quitting: 26.7  . Smokeless tobacco: Never Used  Vaping Use  . Vaping Use: Never used  Substance Use Topics  . Alcohol use: No  . Drug use: No    Home Medications Prior to Admission medications   Medication Sig Start Date End Date Taking? Authorizing Provider  donepezil (ARICEPT) 5 MG tablet Take 1 tablet (5 mg total) by mouth at bedtime. 10/16/19  01/02/20 Yes Martin, Mary-Margaret, FNP  finasteride (PROSCAR) 5 MG tablet Take 1 tablet (5 mg total) by mouth daily. 10/16/19  Yes Hassell Done, Mary-Margaret, FNP  levocetirizine (XYZAL) 5 MG tablet Take 0.5 tablets (2.5 mg total) by mouth every evening. 10/02/19  Yes Hassell Done, Mary-Margaret, FNP  memantine (NAMENDA) 5 MG tablet Take 1 tablet (5 mg total) by mouth in the morning, at noon, and at bedtime. 11/10/19  Yes Martin, Mary-Margaret, FNP  pantoprazole (PROTONIX) 40 MG tablet Take 1 tablet (40 mg total) by mouth daily. 10/16/19  Yes Martin, Mary-Margaret, FNP  terazosin (HYTRIN) 5 MG capsule TAKE (1) CAPSULE DAILY Patient taking differently: Take 5 mg by mouth daily.  10/16/19  Yes Hassell Done, Mary-Margaret, FNP  aspirin 81 MG EC tablet Take 81 mg by mouth daily.      [provider]  Evolocumab (REPATHA SURECLICK) 518 MG/ML SOAJ Inject 140 mg into the skin every 14 (fourteen) days.  02/15/19   Minus Breeding, MD  fluticasone (FLONASE) 50 MCG/ACT nasal spray Place 2 sprays into both nostrils daily. 06/15/19   [name], FNP  Fluticasone-Umeclidin-Vilant (TRELEGY ELLIPTA) 100-62.5-25 MCG/INH AEPB Inhale 1 puff into the lungs daily. 10/16/19   Hassell Done, Mary-Margaret, FNP  guaiFENesin (MUCINEX) 600 MG 12 hr tablet Take 1,200 mg by mouth 2 (two) times daily.    [provider]  LORazepam (ATIVAN) 0.5 MG tablet Take nightly as needed for sleep Patient taking differently: Take 0.5 mg by mouth at bedtime as needed for sleep.  12/11/19   Hassell Done, Mary-Margaret, FNP  PROAIR HFA 108 (90 Base) MCG/ACT inhaler 2 PUFFS EVERY 6 HOURS AS NEEDED FOR WHEEZING OR SHORTNESS OF BREATH Patient taking differently: Inhale 1-2 puffs into the lungs every 6 (six) hours as needed for wheezing or shortness of breath.  04/26/19   [name], FNP    Allergies    Clarithromycin, Crestor [rosuvastatin calcium], Lipitor [atorvastatin calcium], Niaspan [niacin er], Pneumovax [pneumococcal polysaccharide vaccine], Pravachol, Ranitidine hcl, and Simvastatin  Review of Systems   Review of Systems  Constitutional: Negative for chills and fever.  HENT: Negative for ear pain and sore throat.   Eyes: Negative for pain and visual disturbance.  Respiratory: Negative for cough and shortness of breath.   Cardiovascular: Positive for chest pain. Negative for palpitations.  Gastrointestinal: Positive for abdominal pain and nausea. Negative for vomiting.  Genitourinary: Negative for dysuria and hematuria.  Musculoskeletal: Positive for back pain. Negative for arthralgias.  Skin: Negative for color change and rash.  Neurological: Negative for syncope and light-headedness.  All other systems reviewed and are negative.   Physical Exam Updated Vital Signs BP (!) 165/69   Pulse 61   Temp 97.9 F (36.6 C) (Oral)   Resp (!) 22   Ht 5\' 4"  (1.626 m)   Wt 63.5 kg   SpO2 91%   BMI 24.03 kg/m   Physical  Exam Vitals and nursing note reviewed.  Constitutional:      General: He is in acute distress.     Comments: Writhing on stretcher, reaching for his shoulder  HENT:     Head: Normocephalic and atraumatic.  Eyes:     Conjunctiva/sclera: Conjunctivae normal.  Cardiovascular:     Rate and Rhythm: Normal rate and regular rhythm.  Pulmonary:     Effort: Pulmonary effort is normal. No respiratory distress.     Breath sounds: Normal breath sounds.  Abdominal:     Palpations: Abdomen is soft.     Tenderness: There is abdominal  tenderness in the epigastric area. There is no right CVA tenderness, left CVA tenderness or guarding. Positive signs include Murphy's sign.  Musculoskeletal:     Cervical back: Normal range of motion and neck supple.     Comments: No reproducible chest wall or back pain  Skin:    General: Skin is warm and dry.  Neurological:     General: No focal deficit present.     Mental Status: He is alert and oriented to person, place, and time.     ED Results / Procedures / Treatments   Labs (all labs ordered are listed, but only abnormal results are displayed) Labs Reviewed  BASIC METABOLIC PANEL - Abnormal; Notable for the following components:      Result Value   Glucose, Bld 140 (*)    Calcium 8.7 (*)    GFR, Estimated 59 (*)    All other components within normal limits  HEPATIC FUNCTION PANEL - Abnormal; Notable for the following components:   Alkaline Phosphatase 37 (*)    All other components within normal limits  LIPASE, BLOOD - Abnormal; Notable for the following components:   Lipase 1,755 (*)    All other components within normal limits  RESPIRATORY PANEL BY RT PCR (FLU A&B, COVID)  CBC  PROTIME-INR  TYPE AND SCREEN  TROPONIN I (HIGH SENSITIVITY)  TROPONIN I (HIGH SENSITIVITY)    EKG EKG Interpretation  Date/Time:  Tuesday January 02 2020 08:03:57 EDT Ventricular Rate:  43 PR Interval:    QRS Duration: 151 QT Interval:  486 QTC  Calculation: 411 R Axis:   19 Text Interpretation: Sinus bradycardia Ventricular premature complex Left bundle branch block No prior ecg for comparison Does not meet [place] Criteria Confirmed by Octaviano Glow [phone]) on 01/02/2020 8:18:42 AM   Radiology MR Abdomen W or Wo Contrast  Result Date: 01/02/2020 CLINICAL DATA:  Right upper quadrant pain 2 weeks. CT and examinations. EXAM: MRI ABDOMEN WITHOUT AND WITH CONTRAST (INCLUDING MRCP) TECHNIQUE: Multiplanar multisequence MR imaging of the abdomen was performed both before and after the administration of intravenous contrast. Heavily T2-weighted images of the biliary and pancreatic ducts were obtained, and three-dimensional MRCP images were rendered by post processing. CONTRAST:  57mL GADAVIST GADOBUTROL 1 MMOL/ML IV SOLN COMPARISON:  CT and ultrasound examinations 01/02/1999 FINDINGS: Examination is quite limited due to respiratory motion. The postcontrast images and particularly are limited. Lower chest: The lung bases are grossly6 clear. No pulmonary lesions or pleural effusions. The heart is limits of normal in size but no pericardial effusion. Hepatobiliary: No hepatic lesions are identified. No intrahepatic biliary dilatation. Small amount of perihepatic fluid is noted. The gallbladder is moderately distended and demonstrates wall thickening, pericholecystic inflammatory changes and fluid. This also mild inflammatory changes involving the adjacent right hepatic lobe with a small interposed fluid collection. No gallstones are identified. Normal caliber and course of the common bile duct. No common bile duct stones are identified. Pancreas: Age related pancreatic atrophy is noted. There are 2 small cystic lesions in the pancreas there is a 3 mm cyst in the head body junction region and a 10 mm cyst in the pancreatic tail. These are likely benign postinflammatory cyst or possibly side branch IPMNs. Given the patient's age I do not think any further  imaging evaluation or follow-up is necessary. Normal caliber and course of the main pancreatic duct. No findings for pancreatitis. Spleen:  Normal size.  No focal lesions. Adrenals/Urinary Tract: The adrenal glands and kidneys are unremarkable. Stomach/Bowel:  Small hiatal hernia noted. The stomach is otherwise unremarkable. The duodenum, visualized small bowel and visualized colon are unremarkable. There is some fluid/inflammation surrounding the second portion of the duodenum likely due to the gallbladder process. Vascular/Lymphatic: The aorta is normal in caliber. No dissection. The branch vessels are patent. The major venous structures are patent. No mesenteric or retroperitoneal mass or adenopathy. Other:  No ascites or abdominal wall hernia. Musculoskeletal: No significant bony findings. IMPRESSION: 1. Moderately distended gallbladder with wall thickening, pericholecystic inflammatory changes and fluid. This is most likely due to acute acalculous cholecystitis. No common bile duct stones. Nuclear medicine hepatobiliary scan may be helpful for further evaluation. 2. Normal pancreaticobiliary tree. 3. Small pancreatic cystic lesions measuring up to 10 mm. These are likely benign postinflammatory cyst or possibly side branch IPMNs. Given the patient's age I do not think any further imaging evaluation or follow-up is necessary. 4. Small hiatal hernia. Electronically Signed   By: Marijo Sanes M.D.   On: 01/02/2020 14:39   MR 3D Recon At Scanner  Result Date: 01/02/2020 CLINICAL DATA:  Right upper quadrant pain 2 weeks. CT and examinations. EXAM: MRI ABDOMEN WITHOUT AND WITH CONTRAST (INCLUDING MRCP) TECHNIQUE: Multiplanar multisequence MR imaging of the abdomen was performed both before and after the administration of intravenous contrast. Heavily T2-weighted images of the biliary and pancreatic ducts were obtained, and three-dimensional MRCP images were rendered by post processing. CONTRAST:  38mL GADAVIST  GADOBUTROL 1 MMOL/ML IV SOLN COMPARISON:  CT and ultrasound examinations 01/02/1999 FINDINGS: Examination is quite limited due to respiratory motion. The postcontrast images and particularly are limited. Lower chest: The lung bases are grossly6 clear. No pulmonary lesions or pleural effusions. The heart is limits of normal in size but no pericardial effusion. Hepatobiliary: No hepatic lesions are identified. No intrahepatic biliary dilatation. Small amount of perihepatic fluid is noted. The gallbladder is moderately distended and demonstrates wall thickening, pericholecystic inflammatory changes and fluid. This also mild inflammatory changes involving the adjacent right hepatic lobe with a small interposed fluid collection. No gallstones are identified. Normal caliber and course of the common bile duct. No common bile duct stones are identified. Pancreas: Age related pancreatic atrophy is noted. There are 2 small cystic lesions in the pancreas there is a 3 mm cyst in the head body junction region and a 10 mm cyst in the pancreatic tail. These are likely benign postinflammatory cyst or possibly side branch IPMNs. Given the patient's age I do not think any further imaging evaluation or follow-up is necessary. Normal caliber and course of the main pancreatic duct. No findings for pancreatitis. Spleen:  Normal size.  No focal lesions. Adrenals/Urinary Tract: The adrenal glands and kidneys are unremarkable. Stomach/Bowel: Small hiatal hernia noted. The stomach is otherwise unremarkable. The duodenum, visualized small bowel and visualized colon are unremarkable. There is some fluid/inflammation surrounding the second portion of the duodenum likely due to the gallbladder process. Vascular/Lymphatic: The aorta is normal in caliber. No dissection. The branch vessels are patent. The major venous structures are patent. No mesenteric or retroperitoneal mass or adenopathy. Other:  No ascites or abdominal wall hernia.  Musculoskeletal: No significant bony findings. IMPRESSION: 1. Moderately distended gallbladder with wall thickening, pericholecystic inflammatory changes and fluid. This is most likely due to acute acalculous cholecystitis. No common bile duct stones. Nuclear medicine hepatobiliary scan may be helpful for further evaluation. 2. Normal pancreaticobiliary tree. 3. Small pancreatic cystic lesions measuring up to 10 mm. These are likely benign postinflammatory cyst or  possibly side branch IPMNs. Given the patient's age I do not think any further imaging evaluation or follow-up is necessary. 4. Small hiatal hernia. Electronically Signed   By: Marijo Sanes M.D.   On: 01/02/2020 14:39   CT Angio Chest/Abd/Pel for Dissection W and/or Wo Contrast  Result Date: 01/02/2020 CLINICAL DATA:  Chest and abdominal pain EXAM: CT ANGIOGRAPHY CHEST, ABDOMEN AND PELVIS TECHNIQUE: Non-contrast CT of the chest was initially obtained. Multidetector CT imaging through the chest, abdomen and pelvis was performed using the standard protocol during bolus administration of intravenous contrast. Multiplanar reconstructed images and MIPs were obtained and reviewed to evaluate the vascular anatomy. CONTRAST:  132mL OMNIPAQUE IOHEXOL 350 MG/ML SOLN COMPARISON:  Chest CT April 25, 2009 FINDINGS: CTA CHEST FINDINGS Cardiovascular: There is no intramural hematoma on noncontrast enhanced study. There is no evident mediastinal hematoma. There is no thoracic aortic aneurysm or dissection. There are scattered foci of calcification in visualized great vessels. There is aortic atherosclerosis as well as scattered foci of coronary artery calcification. There is no appreciable pericardial effusion or pericardial thickening. No demonstrable pulmonary embolus. Mediastinum/Nodes: Thyroid appears normal. There is no appreciable thoracic adenopathy. There is a focal hiatal hernia. Lungs/Pleura: There is scarring in each lung apex. More severe on the right  than on the left. A degree of superimposed infiltrate in the apical segment of the right upper lobe cannot be excluded. There are scattered areas of scarring and bullous disease in the upper lobe regions. There is patchy bibasilar atelectatic change. No appreciable pleural effusions. There is mild bronchiectatic change in the right middle lobe. Musculoskeletal: No blastic or lytic bone lesions. No appreciable fracture or dislocation. No chest wall lesions. Review of the MIP images confirms the above findings. CTA ABDOMEN AND PELVIS FINDINGS VASCULAR Aorta: No evident abdominal aortic aneurysm or dissection. There is atherosclerotic calcification throughout the course of the aorta without hemodynamically significant obstruction. Celiac: The celiac artery and its branches show no evident dissection or aneurysm. There is moderate plaque at the origin of the celiac artery. Other major branches of the celiac artery appear widely patent. SMA: Superior mesenteric artery and its branches are patent with slight atherosclerotic plaque at the origin of the superior mesenteric artery. Major superior mesenteric artery branches elsewhere are patent. No aneurysm or dissection involving the superior mesenteric artery or its branches. Renals: There is a single renal artery on each side. There is atherosclerotic calcification at the origin the right renal artery causing approximately 70% diameter stenosis. There is 75-80% diameter stenosis at the origin of the left renal artery. No aneurysm or dissection involving the renal arteries and their branches. No fibromuscular dysplasia evident. IMA: Inferior mesenteric artery is patent although there is moderate atherosclerotic plaque at the origin of this vessel. More distally, branches of the inferior mesenteric artery are patent. No aneurysm or dissection involving these branches. Inflow: There is atherosclerotic plaque in both common iliac arteries with several areas of 40-50% diameter  stenosis in these arteries. There are foci of atherosclerotic plaque in both internal iliac and external iliac arteries with moderate least severe narrowing at the origin of the right and left hypogastric arteries but less than 50% diameter stenosis in the external iliac arteries. There is mild atherosclerotic plaque in each common femoral artery with less than 50% diameter stenosis. There is atherosclerotic plaque in each proximal superficial femoral artery with areas of 50-60% diameter stenosis. No aneurysm or dissection involving these vessels. Veins: No obvious venous abnormality within the limitations  of this arterial phase study. Review of the MIP images confirms the above findings. NON-VASCULAR Hepatobiliary: Fatty infiltration is noted in the liver. There is a probable cyst near the dome of the liver on the right measuring 6 mm. Note that the wall of the gallbladder appears mildly thickened and equivocally edematous. No appreciable biliary duct dilatation evident. Pancreas: There is no pancreatic mass or inflammatory focus. Spleen: No splenic lesions are evident. Adrenals/Urinary Tract: Adrenals bilaterally appear normal. There is no appreciable renal mass or hydronephrosis on either side. No appreciable renal or ureteral calculus evident on either side. Note that contrast within the collecting systems and ureters may mask smaller calculi. Urinary bladder wall thickness is within normal limits. Stomach/Bowel: There are sigmoid diverticula without evident diverticulitis. There is no appreciable bowel wall or mesenteric thickening. No evident bowel obstruction. The terminal ileum appears normal. There is no appreciable free air or portal venous air. Lymphatic: There is no evident adenopathy in the abdomen or pelvis. Reproductive: Prostate and seminal vesicles are normal in size and configuration. Other: Appendix appears normal. No appreciable abscess or ascites in the abdomen or pelvis. Musculoskeletal: There  is degenerative change in the thoracic spine. There is 4 mm of anterolisthesis of L3 on L4, felt to be due to underlying spondylosis. No evident blastic or lytic bone lesions. No intramuscular or abdominal wall lesions are evident. Review of the MIP images confirms the above findings. IMPRESSION: CT angiogram chest: 1. No demonstrable thoracic aortic aneurysm or dissection. No mediastinal hematoma or intramural lesion. There is aortic atherosclerosis as well as foci of great vessel and coronary artery calcification. 2. Apical scarring bilaterally. A degree of superimposed infiltrate in the apical segment right upper lobe cannot be excluded. Scattered areas of atelectatic change. Bullous disease in the apices noted. Mild bronchiectasis right middle lobe. 3. Hiatal hernia present. 4. No demonstrable thoracic adenopathy. CT angiogram abdomen; CT angiogram pelvis: 1. Atherosclerotic calcification throughout the aorta as well as at multiple sites in mesenteric and pelvic arterial vessels. Atherosclerotic plaque is most notable in the proximal renal arteries as well as in the common and internal iliac arteries bilaterally. No aneurysm or dissection evident in the aorta, major mesenteric, and major pelvic arterial vessels. No evident fibromuscular dysplasia. 2. Gallbladder wall appears mildly thickened with questionable gallbladder wall edema. This appearance potentially could indicate a degree of acalculus cholecystitis. Ultrasound evaluation of gallbladder advised in this regard. 3.  Fatty infiltration in liver. 4. Sigmoid diverticula without diverticulitis. No bowel wall thickening or bowel obstruction. No abscess in the abdomen or pelvis. Appendix appears normal. 5. No hydronephrosis on either side. No renal or ureteral calculi evident. Note that small calculi could be masked by intravenous contrast. No urinary bladder wall thickening. Aortic Atherosclerosis (ICD10-I70.0). Electronically Signed   By: Lowella Grip  III M.D.   On: 01/02/2020 09:21   US Abdomen Limited RUQ (LIVER/GB)  Result Date: 01/02/2020 CLINICAL DATA:  Biliary colic, abnormal CT results. EXAM: ULTRASOUND ABDOMEN LIMITED RIGHT UPPER QUADRANT COMPARISON:  01/02/2020 CTA chest, abdomen and pelvis. 03/10/2005 CT abdomen report. FINDINGS: Gallbladder: No gallstones visualized. The gallbladder wall is diffusely thickened and edematous measuring 7.6 mm. Pericholecystic free fluid is present. No sonographic Murphy sign noted by sonographer. Common bile duct: Diameter: 3.5 mm Liver: Ill-defined hepatic segment 5 hypervascular region along the gallbladder fossa (ultrasound image 16/65). Mildly increased parenchymal echogenicity. Trace perihepatic free fluid is present. Portal vein is patent on color Doppler imaging with normal direction of blood flow towards  the liver. Other: Please note that limited patient mobility limits evaluation. IMPRESSION: Ill-defined hepatic segment 5 hypervascular region correlating with heterogenous enhancement seen on prior same day CT (CT 7:100). While this may reflect reactive parenchymal inflammation, MRI abdomen with and without contrast is recommended to exclude neoplasm. Diffuse gallbladder wall inflammation with small volume pericholecystic free fluid. No gallstones or biliary dilatation. Electronically Signed   By: Primitivo Gauze M.D.   On: 01/02/2020 11:30    Procedures .Critical Care Performed by: Wyvonnia Dusky, MD Authorized by: Wyvonnia Dusky, MD   Critical care provider statement:    Critical care time (minutes):  45   Critical care was necessary to treat or prevent imminent or life-threatening deterioration of the following conditions: Infection evaluation, Dissection evaluation.   Critical care was time spent personally by me on the following activities:  Discussions with consultants, evaluation of patient's response to treatment, examination of patient, ordering and performing treatments and  interventions, ordering and review of laboratory studies, ordering and review of radiographic studies, pulse oximetry, re-evaluation of patient's condition, obtaining history from patient or surrogate and review of old charts Comments:     Repeat bedside re-evaluation and emergent imaging for suspected aortic injury.  Subsequently found to have inflamed gallbladder and pancreatitis requiring repeat doses of IV pain medications, IV antibiotics, surgical consultation, and repeat bedside reassessment, and goals of care discussion with family members   (including critical care time)  Medications Ordered in ED Medications  piperacillin-tazobactam (ZOSYN) IVPB 3.375 g (has no administration in time range)  HYDROmorphone (DILAUDID) injection 0.5 mg (0.5 mg Intravenous Given 01/02/20 1434)  terazosin (HYTRIN) capsule 5 mg (has no administration in time range)  donepezil (ARICEPT) tablet 5 mg (has no administration in time range)  LORazepam (ATIVAN) tablet 0.5 mg (has no administration in time range)  memantine (NAMENDA) tablet 5 mg (has no administration in time range)  finasteride (PROSCAR) tablet 5 mg (has no administration in time range)  fluticasone (FLONASE) 50 MCG/ACT nasal spray 2 spray (has no administration in time range)  albuterol (VENTOLIN HFA) 108 (90 Base) MCG/ACT inhaler 1 puff (has no administration in time range)  ipratropium (ATROVENT) nebulizer solution 0.5 mg (0.5 mg Nebulization Given 01/02/20 1504)  enoxaparin (LOVENOX) injection 40 mg (40 mg Subcutaneous Given 01/02/20 1434)  0.9 %  sodium chloride infusion ( Intravenous New Bag/Given 01/02/20 1415)  fluticasone furoate-vilanterol (BREO ELLIPTA) 100-25 MCG/INH 1 puff (1 puff Inhalation Not Given 01/02/20 1322)  umeclidinium bromide (INCRUSE ELLIPTA) 62.5 MCG/INH 1 puff (1 puff Inhalation Not Given 01/02/20 1322)  fentaNYL (SUBLIMAZE) injection 100 mcg (100 mcg Intravenous Given 01/02/20 0828)  sodium chloride 0.9 % bolus 1,000  mL (0 mLs Intravenous Stopped 01/02/20 1153)  iohexol (OMNIPAQUE) 350 MG/ML injection 100 mL (150 mLs Intravenous Contrast Given 01/02/20 0838)  HYDROmorphone (DILAUDID) injection 1 mg (1 mg Intravenous Given 01/02/20 1017)  sodium chloride 0.9 % bolus 1,000 mL (0 mLs Intravenous Stopped 01/02/20 1300)  piperacillin-tazobactam (ZOSYN) IVPB 3.375 g (0 g Intravenous Stopped 01/02/20 1244)  gadobutrol (GADAVIST) 1 MMOL/ML injection 7 mL (7 mLs Intravenous Contrast Given 01/02/20 1329)    ED Course  I have reviewed the triage vital signs and the nursing notes.  Pertinent labs & imaging results that were available during my care of the patient were reviewed by me and considered in my medical decision making (see chart for details).  86 yo male w/ dementia presenting to the ED initially with complaint of epigastric pain.  Subsequently  on arrival he was complaining of severe pain in his shoulder blade.  I had a high clinical suspicion for aortic dissection and proceeded for stat CT imaging.  I reviewed this imaging along with the radiology report, which was significant in the acute setting for an enlarged gall bladder.  On reassessment, with aortic dissection now less likely, I felt his clinical exam could be consistent with biliary obstruction, pancreatitis, or cholecystitis, with referred pain to his shoulder.  I ordered a RUQ ultrasound which was reviewed, also showing GB wall thickening and edema, no biliary ductal dilation.  The case was discussed with the general surgeon Dr [name] who felt, given the patient's elevated lipase, that this was likely not a primary acute cholecystitis, and recommended MRI abd imaging and medical admission.  MRI was ordered and patient was admitted  I personally reviewed the patient's labs and imaging.  Labs notable for lipase 1755 suggestive of pancreatitis - which may be obstructive from a mass, or secondary to a gallstone, or else idiopathic pancreatitis.  WBC  10.0 Hgb 13.8 HFP largely unremarkable BMP with normal Cr.  Based on this workup I had a lower suspicion for cholangitis or sepsis.  IV zosyn was ordered for empiric coverage for cholecystitis, and IV fluids were ordered for acute pancreatitis.  IV dilaudid was ordered for pain control, which some relief of pain on reassessment.  I obtained additional history and had goals of care discussion with the patient's son Juanda Crumble at bedside.  He will be talking with his sister and the patient's wife regarding DNR status and advanced directive.  For the time being the patient remained Full code in the ED.  Clinical Course as of Jan 01 1638  Tue Jan 02, 2020  0821 Prior 2020 ecg found showing same LBBB pattern   [MT]  0828 On my initial assessment the patient is writhing on the stretcher, complaining of tearing pain between his shoulder blades.  I've ordered 100 mcg IV fentanyl and a stat CT dissection study, advised CT team okay to proceed without creatinine level   [MT]  0839 Patient at CT now   [MT]  0906 No evident dissection on CT per my viewing - I spoke to Dr Jasmine December the radiologist who has just started the review and sees no evident dissection, awaiting radiology report   [MT]  0930  IMPRESSION: CT angiogram chest:  1. No demonstrable thoracic aortic aneurysm or dissection. No mediastinal hematoma or intramural lesion. There is aortic atherosclerosis as well as foci of great vessel and coronary artery calcification. 2. Apical scarring bilaterally. A degree of superimposed infiltrate in the apical segment right upper lobe cannot be excluded. Scattered areas of atelectatic change. Bullous disease in the apices noted. Mild bronchiectasis right middle lobe. 3. Hiatal hernia present. 4. No demonstrable thoracic adenopathy.   [MT]  [phone]  CT angiogram abdomen; CT angiogram pelvis:  1. Atherosclerotic calcification throughout the aorta as well as at multiple sites in mesenteric and  pelvic arterial vessels. Atherosclerotic plaque is most notable in the proximal renal arteries as well as in the common and internal iliac arteries bilaterally. No aneurysm or dissection evident in the aorta, major mesenteric, and major pelvic arterial vessels. No evident fibromuscular dysplasia.  2. Gallbladder wall appears mildly thickened with questionable gallbladder wall edema. This appearance potentially could indicate a degree of acalculus cholecystitis. Ultrasound evaluation of gallbladder advised in this regard.  3. Fatty infiltration in liver.  4. Sigmoid diverticula without diverticulitis. No bowel wall  thickening or bowel obstruction. No abscess in the abdomen or pelvis. Appendix appears normal.  5. No hydronephrosis on either side. No renal or ureteral calculi evident. Note that small calculi could be masked by intravenous contrast. No urinary bladder wall thickening.  Aortic Atherosclerosis (ICD10-I70.0).   [MT]  0941 RUQ ultrasound ordered.  Pt does have focal epigastric and RUQ ttp.  It's possible biliary disease is causing referred pain into his right shoulder.  His son is now at bedside   [MT]  0941 IV dilaudid ordered as patient is again writhing in pain    [MT]  1141 IMPRESSION: Ill-defined hepatic segment 5 hypervascular region correlating with heterogenous enhancement seen on prior same day CT (CT 7:100). While this may reflect reactive parenchymal inflammation, MRI abdomen with and without contrast is recommended to exclude neoplasm.  Diffuse gallbladder wall inflammation with small volume pericholecystic free fluid.  No gallstones or biliary dilatation.   [MT]  1142 No pancreatic mass or inflammation noted on CT.  Will discuss with general surgery   [MT]  23 Dr bridges from general surgery recommended MRI imaging and medical admission - unclear if there is an obstructing mass, per RUQ ultrasound report.     [MT]    Clinical Course User  Index [MT] Reata Petrov, Carola Rhine, MD    Final Clinical Impression(s) / ED Diagnoses Final diagnoses:  Biliary colic symptom  Acute pancreatitis, unspecified complication status, unspecified pancreatitis type  Epigastric pain    Rx / DC Orders ED Discharge Orders    None       Wyvonnia Dusky, MD 01/02/20 1640

## 2020-01-02 NOTE — ED Notes (Signed)
Pt oriented to the call bell and how to call for help.

## 2020-01-02 NOTE — Progress Notes (Signed)
Pharmacy Antibiotic Note  Carlos Brooks is a 86 y.o. male admitted on 01/02/2020 with intra-abdominal infection.  Pharmacy has been consulted for Zosyn dosing.  Plan: Zosyn 3.375g IV q8h (4 hour infusion).  Monitor labs, c/s, and patient improvement.  Height: 5\' 4"  (162.6 cm) Weight: 63.5 kg (140 lb) IBW/kg (Calculated) : 59.2  Temp (24hrs), Avg:97.9 F (36.6 C), Min:97.9 F (36.6 C), Max:97.9 F (36.6 C)  Recent Labs  Lab 01/02/20 0909  WBC 10.0  CREATININE 1.17    Estimated Creatinine Clearance: 34.4 mL/min (by C-G formula based on SCr of 1.17 mg/dL).    Allergies  Allergen Reactions  . Clarithromycin   . Crestor [Rosuvastatin Calcium]   . Lipitor [Atorvastatin Calcium]   . Niaspan [Niacin Er]   . Pneumovax [Pneumococcal Polysaccharide Vaccine]     Arm swelling and rash  . Pravachol   . Ranitidine Hcl   . Simvastatin     Antimicrobials this admission: Zosyn 10/26 >>     Thank you for allowing pharmacy to be a part of this patient's care.  Margot Ables, PharmD Clinical Pharmacist 01/02/2020 12:32 PM

## 2020-01-03 DIAGNOSIS — K8500 Idiopathic acute pancreatitis without necrosis or infection: Secondary | ICD-10-CM

## 2020-01-03 DIAGNOSIS — K859 Acute pancreatitis without necrosis or infection, unspecified: Secondary | ICD-10-CM

## 2020-01-03 DIAGNOSIS — K819 Cholecystitis, unspecified: Secondary | ICD-10-CM

## 2020-01-03 LAB — CBC
HCT: 41.9 % (ref 39.0–52.0)
Hemoglobin: 13.1 g/dL (ref 13.0–17.0)
MCH: 30.2 pg (ref 26.0–34.0)
MCHC: 31.3 g/dL (ref 30.0–36.0)
MCV: 96.5 fL (ref 80.0–100.0)
Platelets: 167 10*3/uL (ref 150–400)
RBC: 4.34 MIL/uL (ref 4.22–5.81)
RDW: 14.8 % (ref 11.5–15.5)
WBC: 13.3 10*3/uL — ABNORMAL HIGH (ref 4.0–10.5)
nRBC: 0 % (ref 0.0–0.2)

## 2020-01-03 LAB — COMPREHENSIVE METABOLIC PANEL
ALT: 21 U/L (ref 0–44)
AST: 28 U/L (ref 15–41)
Albumin: 3.3 g/dL — ABNORMAL LOW (ref 3.5–5.0)
Alkaline Phosphatase: 33 U/L — ABNORMAL LOW (ref 38–126)
Anion gap: 7 (ref 5–15)
BUN: 23 mg/dL (ref 8–23)
CO2: 26 mmol/L (ref 22–32)
Calcium: 8.1 mg/dL — ABNORMAL LOW (ref 8.9–10.3)
Chloride: 105 mmol/L (ref 98–111)
Creatinine, Ser: 1.1 mg/dL (ref 0.61–1.24)
GFR, Estimated: >60 mL/min (ref >60)
Glucose, Bld: 103 mg/dL — ABNORMAL HIGH (ref 70–99)
Potassium: 3.8 mmol/L (ref 3.5–5.1)
Sodium: 138 mmol/L (ref 135–145)
Total Bilirubin: 0.7 mg/dL (ref 0.3–1.2)
Total Protein: 5.9 g/dL — ABNORMAL LOW (ref 6.5–8.1)

## 2020-01-03 LAB — PROTIME-INR
INR: 1.1 (ref 0.8–1.2)
Prothrombin Time: 14.1 seconds (ref 11.4–15.2)

## 2020-01-03 LAB — LIPASE, BLOOD: Lipase: 45 U/L (ref 11–51)

## 2020-01-03 NOTE — Progress Notes (Addendum)
I was present with the medical student for this service. I personally verified the history of present illness, performed the physical exam, and made the plan for this encounter. I have verified the medical student's documentation and made modifications where appropriately. I have personally documented in my own words a brief history, physical, and plan below.     Doing better overall. Pain improved. Lipase normalized and LFTs are normal still. No stones on [place].  Appreciate GI input as patient's presentation is odd. Would continue antibiotics for possible acalculous cholecystitis. Would not recommend surgery. Diet as tolerated.  Curlene Labrum, MD [facility] [place],  [id number] [phone] (office)    [facility] Progress Note     Subjective: Pt doing well with family present in room. Reports improving RUQ pain, some bloody phlegm following breathing treatment. No overnight events.  Objective: Vital signs in last 24 hours: Temp:  [97.6 F (36.4 C)-98.4 F (36.9 C)] 97.6 F (36.4 C) (10/27 0439) Pulse Rate:  [47-74] 58 (10/27 1300) Resp:  [16-24] 16 (10/27 1300) BP: (134-167)/(56-70) 141/65 (10/27 1300) SpO2:  [90 %-98 %] 92 % (10/27 1328) Weight:  [62.3 kg] 62.3 kg (10/26 1650)    Intake/Output from previous day: 10/26 0701 - 10/27 0700 In: 2050 [IV Piggyback:2050] Out: -  Intake/Output this shift: No intake/output data recorded.  General appearance: cooperative and no distress Head: Normocephalic, without obvious abnormality, atraumatic Resp: Normal WOB GI: soft, minimal tenderness to RUQ  Lab Results:  Recent Labs    01/02/20 0909 01/03/20 0313  WBC 10.0 13.3*  HGB 13.8 13.1  HCT 43.8 41.9  PLT 180 167   BMET Recent Labs    01/02/20 0909 01/03/20 0313  NA 137 138  K 3.7 3.8  CL 102 105  CO2 27 26  GLUCOSE 140* 103*  BUN 23 23  CREATININE 1.17 1.10  CALCIUM 8.7* 8.1*    PT/INR Recent Labs    01/02/20 0909 01/03/20 0313  LABPROT 12.9 14.1  INR 1.0 1.1    Studies/Results: MR Abdomen W or Wo Contrast  Result Date: 01/02/2020 CLINICAL DATA:  Right upper quadrant pain 2 weeks. CT and examinations. EXAM: MRI ABDOMEN WITHOUT AND WITH CONTRAST (INCLUDING MRCP) TECHNIQUE: Multiplanar multisequence MR imaging of the abdomen was performed both before and after the administration of intravenous contrast. Heavily T2-weighted images of the biliary and pancreatic ducts were obtained, and three-dimensional MRCP images were rendered by post processing. CONTRAST:  62mL GADAVIST GADOBUTROL 1 MMOL/ML IV SOLN COMPARISON:  CT and ultrasound examinations 01/02/1999 FINDINGS: Examination is quite limited due to respiratory motion. The postcontrast images and particularly are limited. Lower chest: The lung bases are grossly6 clear. No pulmonary lesions or pleural effusions. The heart is limits of normal in size but no pericardial effusion. Hepatobiliary: No hepatic lesions are identified. No intrahepatic biliary dilatation. Small amount of perihepatic fluid is noted. The gallbladder is moderately distended and demonstrates wall thickening, pericholecystic inflammatory changes and fluid. This also mild inflammatory changes involving the adjacent right hepatic lobe with a small interposed fluid collection. No gallstones are identified. Normal caliber and course of the common bile duct. No common bile duct stones are identified. Pancreas: Age related pancreatic atrophy is noted. There are 2 small cystic lesions in the pancreas there is a 3 mm cyst in the head body junction region and a 10 mm cyst in the pancreatic tail. These are likely benign postinflammatory cyst or possibly side branch IPMNs. Given the  patient's age I do not think any further imaging evaluation or follow-up is necessary. Normal caliber and course of the main pancreatic duct. No findings for pancreatitis. Spleen:  Normal  size.  No focal lesions. Adrenals/Urinary Tract: The adrenal glands and kidneys are unremarkable. Stomach/Bowel: Small hiatal hernia noted. The stomach is otherwise unremarkable. The duodenum, visualized small bowel and visualized colon are unremarkable. There is some fluid/inflammation surrounding the second portion of the duodenum likely due to the gallbladder process. Vascular/Lymphatic: The aorta is normal in caliber. No dissection. The branch vessels are patent. The major venous structures are patent. No mesenteric or retroperitoneal mass or adenopathy. Other:  No ascites or abdominal wall hernia. Musculoskeletal: No significant bony findings. IMPRESSION: 1. Moderately distended gallbladder with wall thickening, pericholecystic inflammatory changes and fluid. This is most likely due to acute acalculous cholecystitis. No common bile duct stones. Nuclear medicine hepatobiliary scan may be helpful for further evaluation. 2. Normal pancreaticobiliary tree. 3. Small pancreatic cystic lesions measuring up to 10 mm. These are likely benign postinflammatory cyst or possibly side branch IPMNs. Given the patient's age I do not think any further imaging evaluation or follow-up is necessary. 4. Small hiatal hernia. Electronically Signed   By: Marijo Sanes M.D.   On: 01/02/2020 14:39   MR 3D Recon At Scanner  Result Date: 01/02/2020 CLINICAL DATA:  Right upper quadrant pain 2 weeks. CT and examinations. EXAM: MRI ABDOMEN WITHOUT AND WITH CONTRAST (INCLUDING MRCP) TECHNIQUE: Multiplanar multisequence MR imaging of the abdomen was performed both before and after the administration of intravenous contrast. Heavily T2-weighted images of the biliary and pancreatic ducts were obtained, and three-dimensional MRCP images were rendered by post processing. CONTRAST:  12mL GADAVIST GADOBUTROL 1 MMOL/ML IV SOLN COMPARISON:  CT and ultrasound examinations 01/02/1999 FINDINGS: Examination is quite limited due to respiratory motion.  The postcontrast images and particularly are limited. Lower chest: The lung bases are grossly6 clear. No pulmonary lesions or pleural effusions. The heart is limits of normal in size but no pericardial effusion. Hepatobiliary: No hepatic lesions are identified. No intrahepatic biliary dilatation. Small amount of perihepatic fluid is noted. The gallbladder is moderately distended and demonstrates wall thickening, pericholecystic inflammatory changes and fluid. This also mild inflammatory changes involving the adjacent right hepatic lobe with a small interposed fluid collection. No gallstones are identified. Normal caliber and course of the common bile duct. No common bile duct stones are identified. Pancreas: Age related pancreatic atrophy is noted. There are 2 small cystic lesions in the pancreas there is a 3 mm cyst in the head body junction region and a 10 mm cyst in the pancreatic tail. These are likely benign postinflammatory cyst or possibly side branch IPMNs. Given the patient's age I do not think any further imaging evaluation or follow-up is necessary. Normal caliber and course of the main pancreatic duct. No findings for pancreatitis. Spleen:  Normal size.  No focal lesions. Adrenals/Urinary Tract: The adrenal glands and kidneys are unremarkable. Stomach/Bowel: Small hiatal hernia noted. The stomach is otherwise unremarkable. The duodenum, visualized small bowel and visualized colon are unremarkable. There is some fluid/inflammation surrounding the second portion of the duodenum likely due to the gallbladder process. Vascular/Lymphatic: The aorta is normal in caliber. No dissection. The branch vessels are patent. The major venous structures are patent. No mesenteric or retroperitoneal mass or adenopathy. Other:  No ascites or abdominal wall hernia. Musculoskeletal: No significant bony findings. IMPRESSION: 1. Moderately distended gallbladder with wall thickening, pericholecystic inflammatory changes and  fluid. This is most likely due to acute acalculous cholecystitis. No common bile duct stones. Nuclear medicine hepatobiliary scan may be helpful for further evaluation. 2. Normal pancreaticobiliary tree. 3. Small pancreatic cystic lesions measuring up to 10 mm. These are likely benign postinflammatory cyst or possibly side branch IPMNs. Given the patient's age I do not think any further imaging evaluation or follow-up is necessary. 4. Small hiatal hernia. Electronically Signed   By: Marijo Sanes M.D.   On: 01/02/2020 14:39   CT Angio Chest/Abd/Pel for Dissection W and/or Wo Contrast  Result Date: 01/02/2020 CLINICAL DATA:  Chest and abdominal pain EXAM: CT ANGIOGRAPHY CHEST, ABDOMEN AND PELVIS TECHNIQUE: Non-contrast CT of the chest was initially obtained. Multidetector CT imaging through the chest, abdomen and pelvis was performed using the standard protocol during bolus administration of intravenous contrast. Multiplanar reconstructed images and MIPs were obtained and reviewed to evaluate the vascular anatomy. CONTRAST:  168mL OMNIPAQUE IOHEXOL 350 MG/ML SOLN COMPARISON:  Chest CT April 25, 2009 FINDINGS: CTA CHEST FINDINGS Cardiovascular: There is no intramural hematoma on noncontrast enhanced study. There is no evident mediastinal hematoma. There is no thoracic aortic aneurysm or dissection. There are scattered foci of calcification in visualized great vessels. There is aortic atherosclerosis as well as scattered foci of coronary artery calcification. There is no appreciable pericardial effusion or pericardial thickening. No demonstrable pulmonary embolus. Mediastinum/Nodes: Thyroid appears normal. There is no appreciable thoracic adenopathy. There is a focal hiatal hernia. Lungs/Pleura: There is scarring in each lung apex. More severe on the right than on the left. A degree of superimposed infiltrate in the apical segment of the right upper lobe cannot be excluded. There are scattered areas of scarring  and bullous disease in the upper lobe regions. There is patchy bibasilar atelectatic change. No appreciable pleural effusions. There is mild bronchiectatic change in the right middle lobe. Musculoskeletal: No blastic or lytic bone lesions. No appreciable fracture or dislocation. No chest wall lesions. Review of the MIP images confirms the above findings. CTA ABDOMEN AND PELVIS FINDINGS VASCULAR Aorta: No evident abdominal aortic aneurysm or dissection. There is atherosclerotic calcification throughout the course of the aorta without hemodynamically significant obstruction. Celiac: The celiac artery and its branches show no evident dissection or aneurysm. There is moderate plaque at the origin of the celiac artery. Other major branches of the celiac artery appear widely patent. SMA: Superior mesenteric artery and its branches are patent with slight atherosclerotic plaque at the origin of the superior mesenteric artery. Major superior mesenteric artery branches elsewhere are patent. No aneurysm or dissection involving the superior mesenteric artery or its branches. Renals: There is a single renal artery on each side. There is atherosclerotic calcification at the origin the right renal artery causing approximately 70% diameter stenosis. There is 75-80% diameter stenosis at the origin of the left renal artery. No aneurysm or dissection involving the renal arteries and their branches. No fibromuscular dysplasia evident. IMA: Inferior mesenteric artery is patent although there is moderate atherosclerotic plaque at the origin of this vessel. More distally, branches of the inferior mesenteric artery are patent. No aneurysm or dissection involving these branches. Inflow: There is atherosclerotic plaque in both common iliac arteries with several areas of 40-50% diameter stenosis in these arteries. There are foci of atherosclerotic plaque in both internal iliac and external iliac arteries with moderate least severe narrowing  at the origin of the right and left hypogastric arteries but less than 50% diameter stenosis in the external iliac arteries. There  is mild atherosclerotic plaque in each common femoral artery with less than 50% diameter stenosis. There is atherosclerotic plaque in each proximal superficial femoral artery with areas of 50-60% diameter stenosis. No aneurysm or dissection involving these vessels. Veins: No obvious venous abnormality within the limitations of this arterial phase study. Review of the MIP images confirms the above findings. NON-VASCULAR Hepatobiliary: Fatty infiltration is noted in the liver. There is a probable cyst near the dome of the liver on the right measuring 6 mm. Note that the wall of the gallbladder appears mildly thickened and equivocally edematous. No appreciable biliary duct dilatation evident. Pancreas: There is no pancreatic mass or inflammatory focus. Spleen: No splenic lesions are evident. Adrenals/Urinary Tract: Adrenals bilaterally appear normal. There is no appreciable renal mass or hydronephrosis on either side. No appreciable renal or ureteral calculus evident on either side. Note that contrast within the collecting systems and ureters may mask smaller calculi. Urinary bladder wall thickness is within normal limits. Stomach/Bowel: There are sigmoid diverticula without evident diverticulitis. There is no appreciable bowel wall or mesenteric thickening. No evident bowel obstruction. The terminal ileum appears normal. There is no appreciable free air or portal venous air. Lymphatic: There is no evident adenopathy in the abdomen or pelvis. Reproductive: Prostate and seminal vesicles are normal in size and configuration. Other: Appendix appears normal. No appreciable abscess or ascites in the abdomen or pelvis. Musculoskeletal: There is degenerative change in the thoracic spine. There is 4 mm of anterolisthesis of L3 on L4, felt to be due to underlying spondylosis. No evident blastic or  lytic bone lesions. No intramuscular or abdominal wall lesions are evident. Review of the MIP images confirms the above findings. IMPRESSION: CT angiogram chest: 1. No demonstrable thoracic aortic aneurysm or dissection. No mediastinal hematoma or intramural lesion. There is aortic atherosclerosis as well as foci of great vessel and coronary artery calcification. 2. Apical scarring bilaterally. A degree of superimposed infiltrate in the apical segment right upper lobe cannot be excluded. Scattered areas of atelectatic change. Bullous disease in the apices noted. Mild bronchiectasis right middle lobe. 3. Hiatal hernia present. 4. No demonstrable thoracic adenopathy. CT angiogram abdomen; CT angiogram pelvis: 1. Atherosclerotic calcification throughout the aorta as well as at multiple sites in mesenteric and pelvic arterial vessels. Atherosclerotic plaque is most notable in the proximal renal arteries as well as in the common and internal iliac arteries bilaterally. No aneurysm or dissection evident in the aorta, major mesenteric, and major pelvic arterial vessels. No evident fibromuscular dysplasia. 2. Gallbladder wall appears mildly thickened with questionable gallbladder wall edema. This appearance potentially could indicate a degree of acalculus cholecystitis. Ultrasound evaluation of gallbladder advised in this regard. 3.  Fatty infiltration in liver. 4. Sigmoid diverticula without diverticulitis. No bowel wall thickening or bowel obstruction. No abscess in the abdomen or pelvis. Appendix appears normal. 5. No hydronephrosis on either side. No renal or ureteral calculi evident. Note that small calculi could be masked by intravenous contrast. No urinary bladder wall thickening. Aortic Atherosclerosis (ICD10-I70.0). Electronically Signed   By: Lowella Grip III M.D.   On: 01/02/2020 09:21   US Abdomen Limited RUQ (LIVER/GB)  Result Date: 01/02/2020 CLINICAL DATA:  Biliary colic, abnormal CT results. EXAM:  ULTRASOUND ABDOMEN LIMITED RIGHT UPPER QUADRANT COMPARISON:  01/02/2020 CTA chest, abdomen and pelvis. 03/10/2005 CT abdomen report. FINDINGS: Gallbladder: No gallstones visualized. The gallbladder wall is diffusely thickened and edematous measuring 7.6 mm. Pericholecystic free fluid is present. No sonographic Murphy sign noted by  sonographer. Common bile duct: Diameter: 3.5 mm Liver: Ill-defined hepatic segment 5 hypervascular region along the gallbladder fossa (ultrasound image 16/65). Mildly increased parenchymal echogenicity. Trace perihepatic free fluid is present. Portal vein is patent on color Doppler imaging with normal direction of blood flow towards the liver. Other: Please note that limited patient mobility limits evaluation. IMPRESSION: Ill-defined hepatic segment 5 hypervascular region correlating with heterogenous enhancement seen on prior same day CT (CT 7:100). While this may reflect reactive parenchymal inflammation, MRI abdomen with and without contrast is recommended to exclude neoplasm. Diffuse gallbladder wall inflammation with small volume pericholecystic free fluid. No gallstones or biliary dilatation. Electronically Signed   By: Primitivo Gauze M.D.   On: 01/02/2020 11:30    Anti-infectives: Anti-infectives (From admission, onward)   Start     Dose/Rate Route Frequency Ordered Stop   01/02/20 1800  piperacillin-tazobactam (ZOSYN) IVPB 3.375 g        3.375 g 12.5 mL/hr over 240 Minutes Intravenous Every 8 hours 01/02/20 1230     01/02/20 1200  piperacillin-tazobactam (ZOSYN) IVPB 3.375 g        3.375 g 100 mL/hr over 30 Minutes Intravenous  Once 01/02/20 1152 01/02/20 1244      Assessment/Plan:   LOS: 1 day    Pt is a 86 YO M admitted for shoulder and RUQ pain, elevated lipase, and imaging indicating possible acalculous cholecystitis, pancreatic cyst vs IPMN. Pt doing well in terms of pain control; labs are stable w/ improved lipase today. Plan to continue w/ non-op mgmt  as follows:  - For acalculous cholecystitis transition from IV to PO antibiotics - Diet as tolerated - CTM lipase, recs per GI - Lovenox for DVT PPX   Raliegh Ip 01/03/2020

## 2020-01-03 NOTE — Consult Note (Signed)
GI Inpatient Consult Note  Reason for Consult: abd pain    Attending Requesting Consult: Dr Verlon Au  History of Present Illness: Carlos Brooks is a 86 y.o. male seen for evaluation of cholecystitis/pancreatitis   Patient presented to emergency room via EMS yesterday for concern of chest, epigastric, back pain.  Reported 10 out of 10 pain.  CT imaging showed enlarged gallbladder and had a right upper quadrant ultrasound as well as MRCP and he was admitted.  Most of history is obtained via his daughter Katharine Look who is at bedside.  States he was feeling in his normal state of health over the past month and denies any chronic abdominal pain issues, feels he was eating well, denies recent weight loss etc.  Acutely yesterday morning at 4 AM woke up with severe chest pain/upper abd pain with 2 episodes of vomiting.  This is what prompted ED admission and work-up as above.  Chronically only GI complaint is GERD which he takes Protonix for.    Overnight he has had drastic improvement in his abd pain.  He has not had any further episodes of vomiting since admission.  Feels sore across his upper abdomen but no severe pain like he had yesterday.  He denies any bowel movement since admission.  No prior blood in stool or dark stools noted. Currently NPO  Denies any prior issues with pancreatitis or similar symptoms.  Last Colonoscopy: 2008   Last Endoscopy: 2006-EGD with dilation of esophageal ring.  Past Medical History:  Past Medical History:  Diagnosis Date   BPH (benign prostatic hyperplasia)    Cancer (HCC)    skin   Cataract    COPD (chronic obstructive pulmonary disease) (HCC)    Generalized headaches    GERD (gastroesophageal reflux disease)    Hiatal hernia    Hyperlipidemia     Problem List: Patient Active Problem List   Diagnosis Date Noted   Pancreatitis 01/02/2020   Acalculous cholecystitis    Vitamin D deficiency 06/15/2019   Tremor, coarse 02/17/2019    Memory loss 02/16/2019   Bruit 11/30/2018   Thoracic aorta atherosclerosis ([place]) 10/17/2018   Primary insomnia 10/17/2018   Controlled substance agreement signed 10/17/2018   LBBB (left bundle branch block) 07/21/2017   Primary osteoarthritis involving multiple joints 08/10/2016   Back pain 08/10/2016   Neck pain 08/10/2016   Neuropathy 10/21/2015   Aortic atherosclerosis ([place]) 10/21/2015   Dyspnea 08/07/2015   Difficulty swallowing solids 06/20/2007   Allergic rhinitis 04/28/2007   LUNG NODULE 04/28/2007   Hyperlipidemia LDL goal <100 03/28/2007   COPD (chronic obstructive pulmonary disease) with chronic bronchitis ([place]) 03/28/2007   Gastroesophageal reflux disease without esophagitis 03/28/2007   BPH (benign prostatic hyperplasia) 03/28/2007    Past Surgical History: Past Surgical History:  Procedure Laterality Date   EYE SURGERY     HERNIA REPAIR     x 3, inguinal    skin cancer removal     head    Allergies: Allergies  Allergen Reactions   Clarithromycin    Crestor [Rosuvastatin Calcium]    Lipitor [Atorvastatin Calcium]    Niaspan [Niacin Er]    Pneumovax [Pneumococcal Polysaccharide Vaccine]     Arm swelling and rash   Pravachol    Ranitidine Hcl    Simvastatin     Home Medications: Facility-Administered Medications Prior to Admission  Medication Dose Route Frequency Provider Last Rate Last Admin   cyanocobalamin ((VITAMIN B-12)) injection 1,000 mcg  1,000 mcg Intramuscular Q30 days  Hassell Done, Mary-Margaret, FNP   1,000 mcg at 12/11/19 2505   Medications Prior to Admission  Medication Sig Dispense Refill Last Dose   donepezil (ARICEPT) 5 MG tablet Take 1 tablet (5 mg total) by mouth at bedtime. 30 tablet 5    finasteride (PROSCAR) 5 MG tablet Take 1 tablet (5 mg total) by mouth daily. 90 tablet 1    levocetirizine (XYZAL) 5 MG tablet Take 0.5 tablets (2.5 mg total) by mouth every evening. 30 tablet 5    memantine (NAMENDA) 5 MG  tablet Take 1 tablet (5 mg total) by mouth in the morning, at noon, and at bedtime. 90 tablet 5    pantoprazole (PROTONIX) 40 MG tablet Take 1 tablet (40 mg total) by mouth daily. 30 tablet 5    terazosin (HYTRIN) 5 MG capsule TAKE (1) CAPSULE DAILY (Patient taking differently: Take 5 mg by mouth daily. ) 90 capsule 1    aspirin 81 MG EC tablet Take 81 mg by mouth daily.        Evolocumab (REPATHA SURECLICK) 397 MG/ML SOAJ Inject 140 mg into the skin every 14 (fourteen) days. 2 pen 11    fluticasone (FLONASE) 50 MCG/ACT nasal spray Place 2 sprays into both nostrils daily. 16 g 6    Fluticasone-Umeclidin-Vilant (TRELEGY ELLIPTA) 100-62.5-25 MCG/INH AEPB Inhale 1 puff into the lungs daily. 28 each 11    guaiFENesin (MUCINEX) 600 MG 12 hr tablet Take 1,200 mg by mouth 2 (two) times daily.      LORazepam (ATIVAN) 0.5 MG tablet Take nightly as needed for sleep (Patient taking differently: Take 0.5 mg by mouth at bedtime as needed for sleep. ) 30 tablet 5    PROAIR HFA 108 (90 Base) MCG/ACT inhaler 2 PUFFS EVERY 6 HOURS AS NEEDED FOR WHEEZING OR SHORTNESS OF BREATH (Patient taking differently: Inhale 1-2 puffs into the lungs every 6 (six) hours as needed for wheezing or shortness of breath. ) 8.5 g 11    Home medication reconciliation was completed with the patient.   Scheduled Inpatient Medications:    donepezil  5 mg Oral QHS   enoxaparin (LOVENOX) injection  40 mg Subcutaneous Q24H   finasteride  5 mg Oral Daily   fluticasone  2 spray Each Nare Daily   fluticasone furoate-vilanterol  1 puff Inhalation Daily   ipratropium  0.5 mg Nebulization TID   LORazepam  0.5 mg Oral QHS   memantine  5 mg Oral TID   terazosin  5 mg Oral QHS   umeclidinium bromide  1 puff Inhalation Daily    Continuous Inpatient Infusions:    sodium chloride 75 mL/hr at 01/03/20 0523   piperacillin-tazobactam (ZOSYN)  IV 3.375 g (01/03/20 0233)    PRN Inpatient Medications:  albuterol,  HYDROmorphone (DILAUDID) injection  Family History: family history includes Aneurysm in his brother; Asthma in his son; Cancer in his brother, father, and mother; Heart attack (age of onset: 12) in his son; Heart attack (age of onset: 15) in his brother; Heart attack (age of onset: 55) in his brother; Heart disease in his son; Hypertension in his son; Obesity in his son.    Social History:   reports that he quit smoking about 26 years ago. He has never used smokeless tobacco. He reports that he does not drink alcohol and does not use drugs.   Review of Systems: Constitutional: Weight is stable.  Eyes: No changes in vision. ENT: No oral lesions, sore throat.  GI: see HPI.  Heme/Lymph: No  easy bruising.  CV: + chest pain on admission GU: No hematuria.  Integumentary: No rashes.  Neuro: No headaches.  Psych: No depression/anxiety.  Endocrine: No heat/cold intolerance.  Allergic/Immunologic: No urticaria.  Resp: No cough, SOB.  Musculoskeletal: No joint swelling.    Physical Examination: BP (!) 134/56 (BP Location: Left Arm)    Pulse (!) 55    Temp 97.6 F (36.4 C)    Resp 17    Ht 5\' 7"  (1.702 m)    Wt 62.3 kg    SpO2 96%    BMI 21.51 kg/m  Gen: NAD, alert and oriented x 4 but trouble w/ recall of specific details. HEENT: PEERLA, EOMI, Neck: supple, no JVD or thyromegaly Chest: CTA bilaterally, no wheezes, crackles, or other adventitious sounds CV: RRR, no m/g/c/r Abd: soft, mild TTP most prominent EG area, ND, +BS in all four quadrants; no HSM, guarding, ridigity, or rebound tenderness Ext: no edema, well perfused with 2+ pulses, Skin: no rash or lesions noted Lymph: no LAD  Data: Lab Results  Component Value Date   WBC 13.3 (H) 01/03/2020   HGB 13.1 01/03/2020   HCT 41.9 01/03/2020   MCV 96.5 01/03/2020   PLT 167 01/03/2020   Recent Labs  Lab 01/02/20 0909 01/03/20 0313  HGB 13.8 13.1   Lab Results  Component Value Date   NA 138 01/03/2020   K 3.8 01/03/2020    CL 105 01/03/2020   CO2 26 01/03/2020   BUN 23 01/03/2020   CREATININE 1.10 01/03/2020   Lab Results  Component Value Date   ALT 21 01/03/2020   AST 28 01/03/2020   ALKPHOS 33 (L) 01/03/2020   BILITOT 0.7 01/03/2020   Recent Labs  Lab 01/03/20 0313  INR 1.1   Lipase on admission 1755 LFTS normal   WBC on admission 10.0, CBC this AM shows WBC 13.3   IMPRESSION: CT angiogram chest:  1. No demonstrable thoracic aortic aneurysm or dissection. No mediastinal hematoma or intramural lesion. There is aortic atherosclerosis as well as foci of great vessel and coronary artery calcification. 2. Apical scarring bilaterally. A degree of superimposed infiltrate in the apical segment right upper lobe cannot be excluded. Scattered areas of atelectatic change. Bullous disease in the apices noted. Mild bronchiectasis right middle lobe. 3. Hiatal hernia present. 4. No demonstrable thoracic adenopathy.  CT angiogram abdomen; CT angiogram pelvis:  1. Atherosclerotic calcification throughout the aorta as well as at multiple sites in mesenteric and pelvic arterial vessels. Atherosclerotic plaque is most notable in the proximal renal arteries as well as in the common and internal iliac arteries bilaterally. No aneurysm or dissection evident in the aorta, major mesenteric, and major pelvic arterial vessels. No evident fibromuscular dysplasia.  2. Gallbladder wall appears mildly thickened with questionable gallbladder wall edema. This appearance potentially could indicate a degree of acalculus cholecystitis. Ultrasound evaluation of gallbladder advised in this regard.  3.  Fatty infiltration in liver.  4. Sigmoid diverticula without diverticulitis. No bowel wall thickening or bowel obstruction. No abscess in the abdomen or pelvis. Appendix appears normal.  5. No hydronephrosis on either side. No renal or ureteral calculi evident. Note that small calculi could be masked by intravenous  contrast. No urinary bladder wall thickening.  Aortic Atherosclerosis (ICD10-I70.0).    MR ABD yesterday - IMPRESSION: 1. Moderately distended gallbladder with wall thickening, pericholecystic inflammatory changes and fluid. This is most likely due to acute acalculous cholecystitis. No common bile duct stones. Nuclear medicine hepatobiliary scan may  be helpful for further evaluation. 2. Normal pancreaticobiliary tree. 3. Small pancreatic cystic lesions measuring up to 10 mm. These are likely benign postinflammatory cyst or possibly side branch IPMNs. Given the patient's age I do not think any further imaging evaluation or follow-up is necessary. 4. Small hiatal hernia.     Assessment/Plan: Mr. Chasen is a 86 y.o. male seen for evaluation of acute onset of abdominal pain.  Imaging suggested acalculus cholecystitis with elevation of lipase but no evidence of pancreatitis on imaging.   -Abdominal pain-clinically improving o zosyn.  Lipase elevated on admission may be due to small stone he passed.  MRCP negative for choledocholithiasis and LFTs have continued to be normal.  We will repeat his lipase today to ensure improving prior to transitioning from n.p.o. to clear liquid diet. Mild bump in WBC - repeat in AM  2.  Small pancreatic cyst-reviewed by Dr. Laural Golden and no evidence of pancreatic mass.  Agrees incidental finding and does not recommend further follow-up imaging.   Recommendations: -Repeat lipase now - Continue Zosyn -Higher risk surgical candidate but if does have CCY recommend IOC  Case was discussed with Dr. Laural Golden. Thank you for the consult. Please call with questions or concerns.  Laurine Blazer, PA-C [facility]

## 2020-01-03 NOTE — Progress Notes (Signed)
PROGRESS NOTE   Carlos Brooks  MVH:[id number] DOB: 1926/08/05 DOA: 01/02/2020 PCP: Bennie Pierini, FNP   Chief Complaint  Patient presents with  . Chest Pain    Brief Admission History:  86 y/o male presents with abdominal pain found to have acalculous cholecystitis and pancreatitis and poor operative candidate.    Assessment & Plan:   Active Problems:   Pancreatitis   Acalculous cholecystitis   1. acalculous cholecystitis - Pt is responding to supportive management and IV antibiotics.  Continue for now.   2. Leukocytosis - reactive - follow with current management.  3. Pancreatitis - presented with markedly elevated lipase and epigastric abd pain.  GI consult requested.  Recheck lipase level and follow.  Continue bowel rest.    DVT prophylaxis: enoxaparin Code Status: full  Family Communication: daughter at bedside  Disposition: TBD  Status is: Inpatient  Remains inpatient appropriate because:IV treatments appropriate due to intensity of illness or inability to take PO and Inpatient level of care appropriate due to severity of illness   Dispo: The patient is from: Home              Anticipated d/c is to: TBD              Anticipated d/c date is: 3 days              Patient currently is not medically stable to d/c.  Consultants:   Surgery  GI  Procedures:     Antimicrobials:  Zosyn 10/26>>  Subjective: Pt reports abd pain seems better today.    Objective: Vitals:   01/02/20 2017 01/02/20 2130 01/03/20 0439 01/03/20 0731  BP:  (!) 145/60 (!) 134/56   Pulse:  66 (!) 55   Resp:  17 17   Temp:  97.8 F (36.6 C) 97.6 F (36.4 C)   TempSrc:  Oral    SpO2: 95% 95% 96% 96%  Weight:      Height:        Intake/Output Summary (Last 24 hours) at 01/03/2020 1126 Last data filed at 01/02/2020 1300 Gross per 24 hour  Intake 2050 ml  Output --  Net 2050 ml   Filed Weights   01/02/20 0806 01/02/20 1650  Weight: 63.5 kg 62.3 kg     Examination:  General exam: Elderly male, awake, alert, Appears calm and comfortable, NAD.  Respiratory system:  Respiratory effort normal. Cardiovascular system: normal S1 & S2 heard. Gastrointestinal system: Abdomen is nondistended, soft and epigastric tenderness with deep palpation. No guarding. No organomegaly or masses felt.  Normal bowel sounds heard. Central nervous system: Alert and oriented x 2. No focal neurological deficits. Extremities: Symmetric 5 x 5 power.  Skin: No rashes, lesions or ulcers Psychiatry:  Mood & affect appropriate.   Data Reviewed: I have personally reviewed following labs and imaging studies  CBC: Recent Labs  Lab 01/02/20 0909 01/03/20 0313  WBC 10.0 13.3*  HGB 13.8 13.1  HCT 43.8 41.9  MCV 96.5 96.5  PLT 180 167    Basic Metabolic Panel: Recent Labs  Lab 01/02/20 0909 01/03/20 0313  NA 137 138  K 3.7 3.8  CL 102 105  CO2 27 26  GLUCOSE 140* 103*  BUN 23 23  CREATININE 1.17 1.10  CALCIUM 8.7* 8.1*    GFR: Estimated Creatinine Clearance: 38.5 mL/min (by C-G formula based on SCr of 1.1 mg/dL).  Liver Function Tests: Recent Labs  Lab 01/02/20 0918 01/03/20 0313  AST 38 28  ALT 21 21  ALKPHOS 37* 33*  BILITOT 0.8 0.7  PROT 6.5 5.9*  ALBUMIN 3.8 3.3*    CBG: No results for input(s): GLUCAP in the last 168 hours.  Recent Results (from the past 240 hour(s))  Respiratory Panel by RT PCR (Flu A&B, Covid) - Nasopharyngeal Swab     Status: None   Collection Time: 01/02/20 11:59 AM   Specimen: Nasopharyngeal Swab  Result Value Ref Range Status   SARS Coronavirus 2 by RT PCR NEGATIVE NEGATIVE Final    Comment: (NOTE) SARS-CoV-2 target nucleic acids are NOT DETECTED.  The SARS-CoV-2 RNA is generally detectable in upper respiratoy specimens during the acute phase of infection. The lowest concentration of SARS-CoV-2 viral copies this assay can detect is 131 copies/mL. A negative result does not preclude  SARS-Cov-2 infection and should not be used as the sole basis for treatment or other patient management decisions. A negative result may occur with  improper specimen collection/handling, submission of specimen other than nasopharyngeal swab, presence of viral mutation(s) within the areas targeted by this assay, and inadequate number of viral copies (<131 copies/mL). A negative result must be combined with clinical observations, patient history, and epidemiological information. The expected result is Negative.  Fact Sheet for Patients:  https://www.moore.com/  Fact Sheet for Healthcare Providers:  https://www.young.biz/  This test is no t yet approved or cleared by the [place] FDA and  has been authorized for detection and/or diagnosis of SARS-CoV-2 by FDA under an Emergency Use Authorization (EUA). This EUA will remain  in effect (meaning this test can be used) for the duration of the COVID-19 declaration under Section 564(b)(1) of the Act, 21 U.S.C. section 360bbb-3(b)(1), unless the authorization is terminated or revoked sooner.     Influenza A by PCR NEGATIVE NEGATIVE Final   Influenza B by PCR NEGATIVE NEGATIVE Final    Comment: (NOTE) The Xpert Xpress SARS-CoV-2/FLU/RSV assay is intended as an aid in  the diagnosis of influenza from Nasopharyngeal swab specimens and  should not be used as a sole basis for treatment. Nasal washings and  aspirates are unacceptable for Xpert Xpress SARS-CoV-2/FLU/RSV  testing.  Fact Sheet for Patients: https://www.moore.com/  Fact Sheet for Healthcare Providers: https://www.young.biz/  This test is not yet approved or cleared by the [place] FDA and  has been authorized for detection and/or diagnosis of SARS-CoV-2 by  FDA under an Emergency Use Authorization (EUA). This EUA will remain  in effect (meaning this test can be used) for the duration of the   Covid-19 declaration under Section 564(b)(1) of the Act, 21  U.S.C. section 360bbb-3(b)(1), unless the authorization is  terminated or revoked. Performed at [facility], [address] [place], [place] [address]      Radiology Studies: MR Abdomen W or Wo Contrast  Result Date: 01/02/2020 CLINICAL DATA:  Right upper quadrant pain 2 weeks. CT and examinations. EXAM: MRI ABDOMEN WITHOUT AND WITH CONTRAST (INCLUDING MRCP) TECHNIQUE: Multiplanar multisequence MR imaging of the abdomen was performed both before and after the administration of intravenous contrast. Heavily T2-weighted images of the biliary and pancreatic ducts were obtained, and three-dimensional MRCP images were rendered by post processing. CONTRAST:  7mL GADAVIST GADOBUTROL 1 MMOL/ML IV SOLN COMPARISON:  CT and ultrasound examinations 01/02/1999 FINDINGS: Examination is quite limited due to respiratory motion. The postcontrast images and particularly are limited. Lower chest: The lung bases are grossly6 clear. No pulmonary lesions or pleural effusions. The heart is limits of normal in size but no  pericardial effusion. Hepatobiliary: No hepatic lesions are identified. No intrahepatic biliary dilatation. Small amount of perihepatic fluid is noted. The gallbladder is moderately distended and demonstrates wall thickening, pericholecystic inflammatory changes and fluid. This also mild inflammatory changes involving the adjacent right hepatic lobe with a small interposed fluid collection. No gallstones are identified. Normal caliber and course of the common bile duct. No common bile duct stones are identified. Pancreas: Age related pancreatic atrophy is noted. There are 2 small cystic lesions in the pancreas there is a 3 mm cyst in the head body junction region and a 10 mm cyst in the pancreatic tail. These are likely benign postinflammatory cyst or possibly side branch IPMNs. Given the patient's age I do not think any further imaging evaluation  or follow-up is necessary. Normal caliber and course of the main pancreatic duct. No findings for pancreatitis. Spleen:  Normal size.  No focal lesions. Adrenals/Urinary Tract: The adrenal glands and kidneys are unremarkable. Stomach/Bowel: Small hiatal hernia noted. The stomach is otherwise unremarkable. The duodenum, visualized small bowel and visualized colon are unremarkable. There is some fluid/inflammation surrounding the second portion of the duodenum likely due to the gallbladder process. Vascular/Lymphatic: The aorta is normal in caliber. No dissection. The branch vessels are patent. The major venous structures are patent. No mesenteric or retroperitoneal mass or adenopathy. Other:  No ascites or abdominal wall hernia. Musculoskeletal: No significant bony findings. IMPRESSION: 1. Moderately distended gallbladder with wall thickening, pericholecystic inflammatory changes and fluid. This is most likely due to acute acalculous cholecystitis. No common bile duct stones. Nuclear medicine hepatobiliary scan may be helpful for further evaluation. 2. Normal pancreaticobiliary tree. 3. Small pancreatic cystic lesions measuring up to 10 mm. These are likely benign postinflammatory cyst or possibly side branch IPMNs. Given the patient's age I do not think any further imaging evaluation or follow-up is necessary. 4. Small hiatal hernia. Electronically Signed   By: Rudie Meyer M.D.   On: 01/02/2020 14:39   MR 3D Recon At Scanner  Result Date: 01/02/2020 CLINICAL DATA:  Right upper quadrant pain 2 weeks. CT and examinations. EXAM: MRI ABDOMEN WITHOUT AND WITH CONTRAST (INCLUDING MRCP) TECHNIQUE: Multiplanar multisequence MR imaging of the abdomen was performed both before and after the administration of intravenous contrast. Heavily T2-weighted images of the biliary and pancreatic ducts were obtained, and three-dimensional MRCP images were rendered by post processing. CONTRAST:  7mL GADAVIST GADOBUTROL 1 MMOL/ML  IV SOLN COMPARISON:  CT and ultrasound examinations 01/02/1999 FINDINGS: Examination is quite limited due to respiratory motion. The postcontrast images and particularly are limited. Lower chest: The lung bases are grossly6 clear. No pulmonary lesions or pleural effusions. The heart is limits of normal in size but no pericardial effusion. Hepatobiliary: No hepatic lesions are identified. No intrahepatic biliary dilatation. Small amount of perihepatic fluid is noted. The gallbladder is moderately distended and demonstrates wall thickening, pericholecystic inflammatory changes and fluid. This also mild inflammatory changes involving the adjacent right hepatic lobe with a small interposed fluid collection. No gallstones are identified. Normal caliber and course of the common bile duct. No common bile duct stones are identified. Pancreas: Age related pancreatic atrophy is noted. There are 2 small cystic lesions in the pancreas there is a 3 mm cyst in the head body junction region and a 10 mm cyst in the pancreatic tail. These are likely benign postinflammatory cyst or possibly side branch IPMNs. Given the patient's age I do not think any further imaging evaluation or follow-up is necessary.  Normal caliber and course of the main pancreatic duct. No findings for pancreatitis. Spleen:  Normal size.  No focal lesions. Adrenals/Urinary Tract: The adrenal glands and kidneys are unremarkable. Stomach/Bowel: Small hiatal hernia noted. The stomach is otherwise unremarkable. The duodenum, visualized small bowel and visualized colon are unremarkable. There is some fluid/inflammation surrounding the second portion of the duodenum likely due to the gallbladder process. Vascular/Lymphatic: The aorta is normal in caliber. No dissection. The branch vessels are patent. The major venous structures are patent. No mesenteric or retroperitoneal mass or adenopathy. Other:  No ascites or abdominal wall hernia. Musculoskeletal: No significant  bony findings. IMPRESSION: 1. Moderately distended gallbladder with wall thickening, pericholecystic inflammatory changes and fluid. This is most likely due to acute acalculous cholecystitis. No common bile duct stones. Nuclear medicine hepatobiliary scan may be helpful for further evaluation. 2. Normal pancreaticobiliary tree. 3. Small pancreatic cystic lesions measuring up to 10 mm. These are likely benign postinflammatory cyst or possibly side branch IPMNs. Given the patient's age I do not think any further imaging evaluation or follow-up is necessary. 4. Small hiatal hernia. Electronically Signed   By: Rudie Meyer M.D.   On: 01/02/2020 14:39   CT Angio Chest/Abd/Pel for Dissection W and/or Wo Contrast  Result Date: 01/02/2020 CLINICAL DATA:  Chest and abdominal pain EXAM: CT ANGIOGRAPHY CHEST, ABDOMEN AND PELVIS TECHNIQUE: Non-contrast CT of the chest was initially obtained. Multidetector CT imaging through the chest, abdomen and pelvis was performed using the standard protocol during bolus administration of intravenous contrast. Multiplanar reconstructed images and MIPs were obtained and reviewed to evaluate the vascular anatomy. CONTRAST:  OMNIPAQUE IOHEXOL 350 MG/ML SOLN COMPARISON:  Chest CT April 25, 2009 FINDINGS: CTA CHEST FINDINGS Cardiovascular: There is no intramural hematoma on noncontrast enhanced study. There is no evident mediastinal hematoma. There is no thoracic aortic aneurysm or dissection. There are scattered foci of calcification in visualized great vessels. There is aortic atherosclerosis as well as scattered foci of coronary artery calcification. There is no appreciable pericardial effusion or pericardial thickening. No demonstrable pulmonary embolus. Mediastinum/Nodes: Thyroid appears normal. There is no appreciable thoracic adenopathy. There is a focal hiatal hernia. Lungs/Pleura: There is scarring in each lung apex. More severe on the right than on the left. A degree of  superimposed infiltrate in the apical segment of the right upper lobe cannot be excluded. There are scattered areas of scarring and bullous disease in the upper lobe regions. There is patchy bibasilar atelectatic change. No appreciable pleural effusions. There is mild bronchiectatic change in the right middle lobe. Musculoskeletal: No blastic or lytic bone lesions. No appreciable fracture or dislocation. No chest wall lesions. Review of the MIP images confirms the above findings. CTA ABDOMEN AND PELVIS FINDINGS VASCULAR Aorta: No evident abdominal aortic aneurysm or dissection. There is atherosclerotic calcification throughout the course of the aorta without hemodynamically significant obstruction. Celiac: The celiac artery and its branches show no evident dissection or aneurysm. There is moderate plaque at the origin of the celiac artery. Other major branches of the celiac artery appear widely patent. SMA: Superior mesenteric artery and its branches are patent with slight atherosclerotic plaque at the origin of the superior mesenteric artery. Major superior mesenteric artery branches elsewhere are patent. No aneurysm or dissection involving the superior mesenteric artery or its branches. Renals: There is a single renal artery on each side. There is atherosclerotic calcification at the origin the right renal artery causing approximately 70% diameter stenosis. There is 75-80% diameter stenosis  at the origin of the left renal artery. No aneurysm or dissection involving the renal arteries and their branches. No fibromuscular dysplasia evident. IMA: Inferior mesenteric artery is patent although there is moderate atherosclerotic plaque at the origin of this vessel. More distally, branches of the inferior mesenteric artery are patent. No aneurysm or dissection involving these branches. Inflow: There is atherosclerotic plaque in both common iliac arteries with several areas of 40-50% diameter stenosis in these arteries.  There are foci of atherosclerotic plaque in both internal iliac and external iliac arteries with moderate least severe narrowing at the origin of the right and left hypogastric arteries but less than 50% diameter stenosis in the external iliac arteries. There is mild atherosclerotic plaque in each common femoral artery with less than 50% diameter stenosis. There is atherosclerotic plaque in each proximal superficial femoral artery with areas of 50-60% diameter stenosis. No aneurysm or dissection involving these vessels. Veins: No obvious venous abnormality within the limitations of this arterial phase study. Review of the MIP images confirms the above findings. NON-VASCULAR Hepatobiliary: Fatty infiltration is noted in the liver. There is a probable cyst near the dome of the liver on the right measuring 6 mm. Note that the wall of the gallbladder appears mildly thickened and equivocally edematous. No appreciable biliary duct dilatation evident. Pancreas: There is no pancreatic mass or inflammatory focus. Spleen: No splenic lesions are evident. Adrenals/Urinary Tract: Adrenals bilaterally appear normal. There is no appreciable renal mass or hydronephrosis on either side. No appreciable renal or ureteral calculus evident on either side. Note that contrast within the collecting systems and ureters may mask smaller calculi. Urinary bladder wall thickness is within normal limits. Stomach/Bowel: There are sigmoid diverticula without evident diverticulitis. There is no appreciable bowel wall or mesenteric thickening. No evident bowel obstruction. The terminal ileum appears normal. There is no appreciable free air or portal venous air. Lymphatic: There is no evident adenopathy in the abdomen or pelvis. Reproductive: Prostate and seminal vesicles are normal in size and configuration. Other: Appendix appears normal. No appreciable abscess or ascites in the abdomen or pelvis. Musculoskeletal: There is degenerative change in  the thoracic spine. There is 4 mm of anterolisthesis of L3 on L4, felt to be due to underlying spondylosis. No evident blastic or lytic bone lesions. No intramuscular or abdominal wall lesions are evident. Review of the MIP images confirms the above findings. IMPRESSION: CT angiogram chest: 1. No demonstrable thoracic aortic aneurysm or dissection. No mediastinal hematoma or intramural lesion. There is aortic atherosclerosis as well as foci of great vessel and coronary artery calcification. 2. Apical scarring bilaterally. A degree of superimposed infiltrate in the apical segment right upper lobe cannot be excluded. Scattered areas of atelectatic change. Bullous disease in the apices noted. Mild bronchiectasis right middle lobe. 3. Hiatal hernia present. 4. No demonstrable thoracic adenopathy. CT angiogram abdomen; CT angiogram pelvis: 1. Atherosclerotic calcification throughout the aorta as well as at multiple sites in mesenteric and pelvic arterial vessels. Atherosclerotic plaque is most notable in the proximal renal arteries as well as in the common and internal iliac arteries bilaterally. No aneurysm or dissection evident in the aorta, major mesenteric, and major pelvic arterial vessels. No evident fibromuscular dysplasia. 2. Gallbladder wall appears mildly thickened with questionable gallbladder wall edema. This appearance potentially could indicate a degree of acalculus cholecystitis. Ultrasound evaluation of gallbladder advised in this regard. 3.  Fatty infiltration in liver. 4. Sigmoid diverticula without diverticulitis. No bowel wall thickening or bowel obstruction. No  abscess in the abdomen or pelvis. Appendix appears normal. 5. No hydronephrosis on either side. No renal or ureteral calculi evident. Note that small calculi could be masked by intravenous contrast. No urinary bladder wall thickening. Aortic Atherosclerosis (ICD10-I70.0). Electronically Signed   By: Bretta Bang III M.D.   On: 01/02/2020  09:21   US Abdomen Limited RUQ (LIVER/GB)  Result Date: 01/02/2020 CLINICAL DATA:  Biliary colic, abnormal CT results. EXAM: ULTRASOUND ABDOMEN LIMITED RIGHT UPPER QUADRANT COMPARISON:  01/02/2020 CTA chest, abdomen and pelvis. 03/10/2005 CT abdomen report. FINDINGS: Gallbladder: No gallstones visualized. The gallbladder wall is diffusely thickened and edematous measuring 7.6 mm. Pericholecystic free fluid is present. No sonographic Murphy sign noted by sonographer. Common bile duct: Diameter: 3.5 mm Liver: Ill-defined hepatic segment 5 hypervascular region along the gallbladder fossa (ultrasound image 16/65). Mildly increased parenchymal echogenicity. Trace perihepatic free fluid is present. Portal vein is patent on color Doppler imaging with normal direction of blood flow towards the liver. Other: Please note that limited patient mobility limits evaluation. IMPRESSION: Ill-defined hepatic segment 5 hypervascular region correlating with heterogenous enhancement seen on prior same day CT (CT 7:100). While this may reflect reactive parenchymal inflammation, MRI abdomen with and without contrast is recommended to exclude neoplasm. Diffuse gallbladder wall inflammation with small volume pericholecystic free fluid. No gallstones or biliary dilatation. Electronically Signed   By: Stana Bunting M.D.   On: 01/02/2020 11:30     Scheduled Meds: . donepezil  5 mg Oral QHS  . enoxaparin (LOVENOX) injection  40 mg Subcutaneous Q24H  . finasteride  5 mg Oral Daily  . fluticasone  2 spray Each Nare Daily  . fluticasone furoate-vilanterol  1 puff Inhalation Daily  . ipratropium  0.5 mg Nebulization TID  . LORazepam  0.5 mg Oral QHS  . memantine  5 mg Oral TID  . terazosin  5 mg Oral QHS   Continuous Infusions: . sodium chloride 75 mL/hr at 01/03/20 0523  . piperacillin-tazobactam (ZOSYN)  IV 3.375 g (01/03/20 1056)     LOS: 1 day   Time spent: 32 minutes   Fidel Caggiano Laural Benes, MD How to contact the  [facility] Attending or Consulting provider 7A - 7P or covering provider during after hours 7P -7A, for this patient?  1. Check the care team in [facility] and look for a) attending/consulting [facility] provider listed and b) the [facility] team listed 2. Log into [email or web address] and use [place]'s universal password to access. If you do not have the password, please contact the hospital operator. 3. Locate the [facility] provider you are looking for under [facility] and page to a number that you can be directly reached. 4. If you still have difficulty reaching the provider, please page the [facility] (Director on Call) for the Hospitalists listed on amion for assistance.  01/03/2020, 11:26 AM

## 2020-01-03 NOTE — Progress Notes (Signed)
Walked in to give patient breathing treatment and patient's sats were in upper 80s.  Put [place] back on patient at 1L, patient sat is now 92%.

## 2020-01-04 ENCOUNTER — Ambulatory Visit: Payer: Self-pay | Admitting: Adult Health

## 2020-01-04 DIAGNOSIS — K859 Acute pancreatitis without necrosis or infection, unspecified: Principal | ICD-10-CM

## 2020-01-04 DIAGNOSIS — K819 Cholecystitis, unspecified: Secondary | ICD-10-CM | POA: Diagnosis not present

## 2020-01-04 LAB — COMPREHENSIVE METABOLIC PANEL
ALT: 19 U/L (ref 0–44)
AST: 27 U/L (ref 15–41)
Albumin: 2.9 g/dL — ABNORMAL LOW (ref 3.5–5.0)
Alkaline Phosphatase: 33 U/L — ABNORMAL LOW (ref 38–126)
Anion gap: 10 (ref 5–15)
BUN: 25 mg/dL — ABNORMAL HIGH (ref 8–23)
CO2: 24 mmol/L (ref 22–32)
Calcium: 8.3 mg/dL — ABNORMAL LOW (ref 8.9–10.3)
Chloride: 103 mmol/L (ref 98–111)
Creatinine, Ser: 1.14 mg/dL (ref 0.61–1.24)
GFR, Estimated: >60 mL/min (ref >60)
Glucose, Bld: 110 mg/dL — ABNORMAL HIGH (ref 70–99)
Potassium: 3.4 mmol/L — ABNORMAL LOW (ref 3.5–5.1)
Sodium: 137 mmol/L (ref 135–145)
Total Bilirubin: 1.1 mg/dL (ref 0.3–1.2)
Total Protein: 5.8 g/dL — ABNORMAL LOW (ref 6.5–8.1)

## 2020-01-04 LAB — CBC WITH DIFFERENTIAL/PLATELET
Abs Immature Granulocytes: 0.11 10*3/uL — ABNORMAL HIGH (ref 0.00–0.07)
Basophils Absolute: 0.1 10*3/uL (ref 0.0–0.1)
Basophils Relative: 0 %
Eosinophils Absolute: 0 10*3/uL (ref 0.0–0.5)
Eosinophils Relative: 0 %
HCT: 38.7 % — ABNORMAL LOW (ref 39.0–52.0)
Hemoglobin: 12.7 g/dL — ABNORMAL LOW (ref 13.0–17.0)
Immature Granulocytes: 1 %
Lymphocytes Relative: 9 %
Lymphs Abs: 1.4 10*3/uL (ref 0.7–4.0)
MCH: 31.2 pg (ref 26.0–34.0)
MCHC: 32.8 g/dL (ref 30.0–36.0)
MCV: 95.1 fL (ref 80.0–100.0)
Monocytes Absolute: 1.8 10*3/uL — ABNORMAL HIGH (ref 0.1–1.0)
Monocytes Relative: 11 %
Neutro Abs: 13 10*3/uL — ABNORMAL HIGH (ref 1.7–7.7)
Neutrophils Relative %: 79 %
Platelets: 164 10*3/uL (ref 150–400)
RBC: 4.07 MIL/uL — ABNORMAL LOW (ref 4.22–5.81)
RDW: 15 % (ref 11.5–15.5)
WBC: 16.4 10*3/uL — ABNORMAL HIGH (ref 4.0–10.5)
nRBC: 0 % (ref 0.0–0.2)

## 2020-01-04 LAB — MAGNESIUM: Magnesium: 2.1 mg/dL (ref 1.7–2.4)

## 2020-01-04 LAB — LIPASE, BLOOD: Lipase: 23 U/L (ref 11–51)

## 2020-01-04 MED ORDER — SACCHAROMYCES BOULARDII 250 MG PO CAPS
250.0000 mg | ORAL_CAPSULE | Freq: Two times a day (BID) | ORAL | Status: DC
  Administered 2020-01-04 – 2020-01-06 (×5): 250 mg via ORAL
  Filled 2020-01-04 (×5): qty 1

## 2020-01-04 MED ORDER — POTASSIUM CHLORIDE 20 MEQ/15ML (10%) PO SOLN
40.0000 meq | Freq: Once | ORAL | Status: AC
  Administered 2020-01-04: 40 meq via ORAL
  Filled 2020-01-04: qty 30

## 2020-01-04 NOTE — Plan of Care (Signed)
  Problem: Acute Rehab PT Goals(only PT should resolve) Goal: Pt Will Go Supine/Side To Sit Outcome: Progressing [place]she[place] (Taken 01/04/2020 1435) Pt will go Supine/Side to Sit: with supervision Goal: Pt Will Go Sit To Supine/Side Outcome: Progressing Flowsheets (Taken 01/04/2020 1435) Pt will go Sit to Supine/Side: with supervision Goal: Patient Will Transfer Sit To/From Stand Outcome: Progressing [place] (Taken 01/04/2020 1435) Patient will transfer sit to/from stand: with min guard assist Goal: Pt Will Transfer Bed To Chair/Chair To Bed Outcome: Progressing Flowsheets (Taken 01/04/2020 1435) Pt will Transfer Bed to Chair/Chair to Bed: min guard assist Goal: Pt Will Ambulate Outcome: Progressing Flowsheets (Taken 01/04/2020 1435) Pt will Ambulate:  50 feet  with min guard assist    2:35 PM, 01/04/20 Mearl Latin PT, DPT Physical Therapist at [facility]

## 2020-01-04 NOTE — Plan of Care (Signed)
  Problem: Education: Goal: Knowledge of General Education information will improve Description Including pain rating scale, medication(s)/side effects and non-pharmacologic comfort measures Outcome: Progressing   Problem: Health Behavior/Discharge Planning: Goal: Ability to manage health-related needs will improve Outcome: Progressing   

## 2020-01-04 NOTE — Progress Notes (Addendum)
I was present with the medical student for this service. I personally verified the history of present illness, performed the physical exam, and made the plan for this encounter. I have verified the medical student's documentation and made modifications where appropriately. I have personally documented in my own words a brief history, physical, and plan below.     Still some pain RUQ. Leukocytosis up but shift down.  Would continue IV antibiotics.  Diet as tolerated.  No need to rush into cholecystostomy tube as he is clinically stable/ improving slowly.   Curlene Labrum, MD [facility] [place], [place] [address] [phone] (office)   [facility] Progress Note     Subjective: Pt reports RUQ improved but still present. Some PO intake with water and soup. Pt seen w/ daughter present. ON events signicant for O2 sats to upper 80s which improved to 90s w/ 1L O2.  Objective: Vital signs in last 24 hours: Temp:  [97.6 F (36.4 C)-99.9 F (37.7 C)] 97.6 F (36.4 C) (10/28 0620) Pulse Rate:  [58-68] 65 (10/28 0620) Resp:  [16-18] 18 (10/28 0620) BP: (128-147)/(49-65) 128/65 (10/28 0620) SpO2:  [91 %-92 %] 91 % (10/28 0714) Last BM Date: 01/01/20  Intake/Output from previous day: 10/27 0701 - 10/28 0700 In: -  Out: 400 [Urine:400] Intake/Output this shift: No intake/output data recorded. 400 ml/ 62.3kg/ 24hr = 0.26 ml/kg/hr  General appearance: cooperative and no distress Head: Normocephalic, without obvious abnormality, atraumatic Resp: CTAB, normal WOB Cardio: regular rate and rhythm, S1, S2 normal, no murmur, click, rub or gallop GI: soft, minimal TTP RUQ  Lab Results:  Recent Labs    01/03/20 0313 01/04/20 0517  WBC 13.3* 16.4*  HGB 13.1 12.7*  HCT 41.9 38.7*  PLT 167 164   BMET Recent Labs    01/03/20 0313 01/04/20 0517  NA 138 137  K 3.8 3.4*  CL 105 103  CO2 26 24  GLUCOSE 103* 110*  BUN  23 25*  CREATININE 1.10 1.14  CALCIUM 8.1* 8.3*   PT/INR Recent Labs    01/02/20 0909 01/03/20 0313  LABPROT 12.9 14.1  INR 1.0 1.1    Studies/Results: MR Abdomen W or Wo Contrast  Result Date: 01/02/2020 CLINICAL DATA:  Right upper quadrant pain 2 weeks. CT and examinations. EXAM: MRI ABDOMEN WITHOUT AND WITH CONTRAST (INCLUDING MRCP) TECHNIQUE: Multiplanar multisequence MR imaging of the abdomen was performed both before and after the administration of intravenous contrast. Heavily T2-weighted images of the biliary and pancreatic ducts were obtained, and three-dimensional MRCP images were rendered by post processing. CONTRAST:  4mL GADAVIST GADOBUTROL 1 MMOL/ML IV SOLN COMPARISON:  CT and ultrasound examinations 01/02/1999 FINDINGS: Examination is quite limited due to respiratory motion. The postcontrast images and particularly are limited. Lower chest: The lung bases are grossly6 clear. No pulmonary lesions or pleural effusions. The heart is limits of normal in size but no pericardial effusion. Hepatobiliary: No hepatic lesions are identified. No intrahepatic biliary dilatation. Small amount of perihepatic fluid is noted. The gallbladder is moderately distended and demonstrates wall thickening, pericholecystic inflammatory changes and fluid. This also mild inflammatory changes involving the adjacent right hepatic lobe with a small interposed fluid collection. No gallstones are identified. Normal caliber and course of the common bile duct. No common bile duct stones are identified. Pancreas: Age related pancreatic atrophy is noted. There are 2 small cystic lesions in the pancreas there is a 3 mm cyst in the head body junction region  and a 10 mm cyst in the pancreatic tail. These are likely benign postinflammatory cyst or possibly side branch IPMNs. Given the patient's age I do not think any further imaging evaluation or follow-up is necessary. Normal caliber and course of the main pancreatic duct.  No findings for pancreatitis. Spleen:  Normal size.  No focal lesions. Adrenals/Urinary Tract: The adrenal glands and kidneys are unremarkable. Stomach/Bowel: Small hiatal hernia noted. The stomach is otherwise unremarkable. The duodenum, visualized small bowel and visualized colon are unremarkable. There is some fluid/inflammation surrounding the second portion of the duodenum likely due to the gallbladder process. Vascular/Lymphatic: The aorta is normal in caliber. No dissection. The branch vessels are patent. The major venous structures are patent. No mesenteric or retroperitoneal mass or adenopathy. Other:  No ascites or abdominal wall hernia. Musculoskeletal: No significant bony findings. IMPRESSION: 1. Moderately distended gallbladder with wall thickening, pericholecystic inflammatory changes and fluid. This is most likely due to acute acalculous cholecystitis. No common bile duct stones. Nuclear medicine hepatobiliary scan may be helpful for further evaluation. 2. Normal pancreaticobiliary tree. 3. Small pancreatic cystic lesions measuring up to 10 mm. These are likely benign postinflammatory cyst or possibly side branch IPMNs. Given the patient's age I do not think any further imaging evaluation or follow-up is necessary. 4. Small hiatal hernia. Electronically Signed   By: Marijo Sanes M.D.   On: 01/02/2020 14:39   MR 3D Recon At Scanner  Result Date: 01/02/2020 CLINICAL DATA:  Right upper quadrant pain 2 weeks. CT and examinations. EXAM: MRI ABDOMEN WITHOUT AND WITH CONTRAST (INCLUDING MRCP) TECHNIQUE: Multiplanar multisequence MR imaging of the abdomen was performed both before and after the administration of intravenous contrast. Heavily T2-weighted images of the biliary and pancreatic ducts were obtained, and three-dimensional MRCP images were rendered by post processing. CONTRAST:  74mL GADAVIST GADOBUTROL 1 MMOL/ML IV SOLN COMPARISON:  CT and ultrasound examinations 01/02/1999 FINDINGS:  Examination is quite limited due to respiratory motion. The postcontrast images and particularly are limited. Lower chest: The lung bases are grossly6 clear. No pulmonary lesions or pleural effusions. The heart is limits of normal in size but no pericardial effusion. Hepatobiliary: No hepatic lesions are identified. No intrahepatic biliary dilatation. Small amount of perihepatic fluid is noted. The gallbladder is moderately distended and demonstrates wall thickening, pericholecystic inflammatory changes and fluid. This also mild inflammatory changes involving the adjacent right hepatic lobe with a small interposed fluid collection. No gallstones are identified. Normal caliber and course of the common bile duct. No common bile duct stones are identified. Pancreas: Age related pancreatic atrophy is noted. There are 2 small cystic lesions in the pancreas there is a 3 mm cyst in the head body junction region and a 10 mm cyst in the pancreatic tail. These are likely benign postinflammatory cyst or possibly side branch IPMNs. Given the patient's age I do not think any further imaging evaluation or follow-up is necessary. Normal caliber and course of the main pancreatic duct. No findings for pancreatitis. Spleen:  Normal size.  No focal lesions. Adrenals/Urinary Tract: The adrenal glands and kidneys are unremarkable. Stomach/Bowel: Small hiatal hernia noted. The stomach is otherwise unremarkable. The duodenum, visualized small bowel and visualized colon are unremarkable. There is some fluid/inflammation surrounding the second portion of the duodenum likely due to the gallbladder process. Vascular/Lymphatic: The aorta is normal in caliber. No dissection. The branch vessels are patent. The major venous structures are patent. No mesenteric or retroperitoneal mass or adenopathy. Other:  No ascites  or abdominal wall hernia. Musculoskeletal: No significant bony findings. IMPRESSION: 1. Moderately distended gallbladder with wall  thickening, pericholecystic inflammatory changes and fluid. This is most likely due to acute acalculous cholecystitis. No common bile duct stones. Nuclear medicine hepatobiliary scan may be helpful for further evaluation. 2. Normal pancreaticobiliary tree. 3. Small pancreatic cystic lesions measuring up to 10 mm. These are likely benign postinflammatory cyst or possibly side branch IPMNs. Given the patient's age I do not think any further imaging evaluation or follow-up is necessary. 4. Small hiatal hernia. Electronically Signed   By: Marijo Sanes M.D.   On: 01/02/2020 14:39   CT Angio Chest/Abd/Pel for Dissection W and/or Wo Contrast  Result Date: 01/02/2020 CLINICAL DATA:  Chest and abdominal pain EXAM: CT ANGIOGRAPHY CHEST, ABDOMEN AND PELVIS TECHNIQUE: Non-contrast CT of the chest was initially obtained. Multidetector CT imaging through the chest, abdomen and pelvis was performed using the standard protocol during bolus administration of intravenous contrast. Multiplanar reconstructed images and MIPs were obtained and reviewed to evaluate the vascular anatomy. CONTRAST:  172mL OMNIPAQUE IOHEXOL 350 MG/ML SOLN COMPARISON:  Chest CT April 25, 2009 FINDINGS: CTA CHEST FINDINGS Cardiovascular: There is no intramural hematoma on noncontrast enhanced study. There is no evident mediastinal hematoma. There is no thoracic aortic aneurysm or dissection. There are scattered foci of calcification in visualized great vessels. There is aortic atherosclerosis as well as scattered foci of coronary artery calcification. There is no appreciable pericardial effusion or pericardial thickening. No demonstrable pulmonary embolus. Mediastinum/Nodes: Thyroid appears normal. There is no appreciable thoracic adenopathy. There is a focal hiatal hernia. Lungs/Pleura: There is scarring in each lung apex. More severe on the right than on the left. A degree of superimposed infiltrate in the apical segment of the right upper lobe  cannot be excluded. There are scattered areas of scarring and bullous disease in the upper lobe regions. There is patchy bibasilar atelectatic change. No appreciable pleural effusions. There is mild bronchiectatic change in the right middle lobe. Musculoskeletal: No blastic or lytic bone lesions. No appreciable fracture or dislocation. No chest wall lesions. Review of the MIP images confirms the above findings. CTA ABDOMEN AND PELVIS FINDINGS VASCULAR Aorta: No evident abdominal aortic aneurysm or dissection. There is atherosclerotic calcification throughout the course of the aorta without hemodynamically significant obstruction. Celiac: The celiac artery and its branches show no evident dissection or aneurysm. There is moderate plaque at the origin of the celiac artery. Other major branches of the celiac artery appear widely patent. SMA: Superior mesenteric artery and its branches are patent with slight atherosclerotic plaque at the origin of the superior mesenteric artery. Major superior mesenteric artery branches elsewhere are patent. No aneurysm or dissection involving the superior mesenteric artery or its branches. Renals: There is a single renal artery on each side. There is atherosclerotic calcification at the origin the right renal artery causing approximately 70% diameter stenosis. There is 75-80% diameter stenosis at the origin of the left renal artery. No aneurysm or dissection involving the renal arteries and their branches. No fibromuscular dysplasia evident. IMA: Inferior mesenteric artery is patent although there is moderate atherosclerotic plaque at the origin of this vessel. More distally, branches of the inferior mesenteric artery are patent. No aneurysm or dissection involving these branches. Inflow: There is atherosclerotic plaque in both common iliac arteries with several areas of 40-50% diameter stenosis in these arteries. There are foci of atherosclerotic plaque in both internal iliac and  external iliac arteries with moderate least severe narrowing  at the origin of the right and left hypogastric arteries but less than 50% diameter stenosis in the external iliac arteries. There is mild atherosclerotic plaque in each common femoral artery with less than 50% diameter stenosis. There is atherosclerotic plaque in each proximal superficial femoral artery with areas of 50-60% diameter stenosis. No aneurysm or dissection involving these vessels. Veins: No obvious venous abnormality within the limitations of this arterial phase study. Review of the MIP images confirms the above findings. NON-VASCULAR Hepatobiliary: Fatty infiltration is noted in the liver. There is a probable cyst near the dome of the liver on the right measuring 6 mm. Note that the wall of the gallbladder appears mildly thickened and equivocally edematous. No appreciable biliary duct dilatation evident. Pancreas: There is no pancreatic mass or inflammatory focus. Spleen: No splenic lesions are evident. Adrenals/Urinary Tract: Adrenals bilaterally appear normal. There is no appreciable renal mass or hydronephrosis on either side. No appreciable renal or ureteral calculus evident on either side. Note that contrast within the collecting systems and ureters may mask smaller calculi. Urinary bladder wall thickness is within normal limits. Stomach/Bowel: There are sigmoid diverticula without evident diverticulitis. There is no appreciable bowel wall or mesenteric thickening. No evident bowel obstruction. The terminal ileum appears normal. There is no appreciable free air or portal venous air. Lymphatic: There is no evident adenopathy in the abdomen or pelvis. Reproductive: Prostate and seminal vesicles are normal in size and configuration. Other: Appendix appears normal. No appreciable abscess or ascites in the abdomen or pelvis. Musculoskeletal: There is degenerative change in the thoracic spine. There is 4 mm of anterolisthesis of L3 on L4, felt  to be due to underlying spondylosis. No evident blastic or lytic bone lesions. No intramuscular or abdominal wall lesions are evident. Review of the MIP images confirms the above findings. IMPRESSION: CT angiogram chest: 1. No demonstrable thoracic aortic aneurysm or dissection. No mediastinal hematoma or intramural lesion. There is aortic atherosclerosis as well as foci of great vessel and coronary artery calcification. 2. Apical scarring bilaterally. A degree of superimposed infiltrate in the apical segment right upper lobe cannot be excluded. Scattered areas of atelectatic change. Bullous disease in the apices noted. Mild bronchiectasis right middle lobe. 3. Hiatal hernia present. 4. No demonstrable thoracic adenopathy. CT angiogram abdomen; CT angiogram pelvis: 1. Atherosclerotic calcification throughout the aorta as well as at multiple sites in mesenteric and pelvic arterial vessels. Atherosclerotic plaque is most notable in the proximal renal arteries as well as in the common and internal iliac arteries bilaterally. No aneurysm or dissection evident in the aorta, major mesenteric, and major pelvic arterial vessels. No evident fibromuscular dysplasia. 2. Gallbladder wall appears mildly thickened with questionable gallbladder wall edema. This appearance potentially could indicate a degree of acalculus cholecystitis. Ultrasound evaluation of gallbladder advised in this regard. 3.  Fatty infiltration in liver. 4. Sigmoid diverticula without diverticulitis. No bowel wall thickening or bowel obstruction. No abscess in the abdomen or pelvis. Appendix appears normal. 5. No hydronephrosis on either side. No renal or ureteral calculi evident. Note that small calculi could be masked by intravenous contrast. No urinary bladder wall thickening. Aortic Atherosclerosis (ICD10-I70.0). Electronically Signed   By: Lowella Grip III M.D.   On: 01/02/2020 09:21   US Abdomen Limited RUQ (LIVER/GB)  Result Date:  01/02/2020 CLINICAL DATA:  Biliary colic, abnormal CT results. EXAM: ULTRASOUND ABDOMEN LIMITED RIGHT UPPER QUADRANT COMPARISON:  01/02/2020 CTA chest, abdomen and pelvis. 03/10/2005 CT abdomen report. FINDINGS: Gallbladder: No gallstones visualized.  The gallbladder wall is diffusely thickened and edematous measuring 7.6 mm. Pericholecystic free fluid is present. No sonographic Murphy sign noted by sonographer. Common bile duct: Diameter: 3.5 mm Liver: Ill-defined hepatic segment 5 hypervascular region along the gallbladder fossa (ultrasound image 16/65). Mildly increased parenchymal echogenicity. Trace perihepatic free fluid is present. Portal vein is patent on color Doppler imaging with normal direction of blood flow towards the liver. Other: Please note that limited patient mobility limits evaluation. IMPRESSION: Ill-defined hepatic segment 5 hypervascular region correlating with heterogenous enhancement seen on prior same day CT (CT 7:100). While this may reflect reactive parenchymal inflammation, MRI abdomen with and without contrast is recommended to exclude neoplasm. Diffuse gallbladder wall inflammation with small volume pericholecystic free fluid. No gallstones or biliary dilatation. Electronically Signed   By: Primitivo Gauze M.D.   On: 01/02/2020 11:30    Anti-infectives: Anti-infectives (From admission, onward)   Start     Dose/Rate Route Frequency Ordered Stop   01/02/20 1800  piperacillin-tazobactam (ZOSYN) IVPB 3.375 g        3.375 g 12.5 mL/hr over 240 Minutes Intravenous Every 8 hours 01/02/20 1230     01/02/20 1200  piperacillin-tazobactam (ZOSYN) IVPB 3.375 g        3.375 g 100 mL/hr over 30 Minutes Intravenous  Once 01/02/20 1152 01/02/20 1244      Assessment/Plan:  LOS: 2 days    Pt well-appearing clinically with measured urine output low at 26.75 ml/kg/hr (but also with unmeasured urine output) and labs significant for increased WBC today. Plan as follows:  - Follow  labs, WBC, urine output - Continue w/ GI input - Diet as tolerated - Continue abx for possible acalculous cystitis   Raliegh Ip 01/04/2020

## 2020-01-04 NOTE — Progress Notes (Signed)
Patient on room air with sats in the mid 80's. Placed patient back on 1LNC with improvement in sats to 91.

## 2020-01-04 NOTE — Progress Notes (Signed)
Subjective:  Daughter at bedside.  Patient states not much difference in abdominal pain from yesterday to today.  Improved from admission.  Pain in the epigastric region.  Chronically coughs and this morning noted increased pain after coughing.  No nausea or vomiting.  Tolerating clear liquids without increase in abdominal pain.  Objective: Vital signs in last 24 hours: Temp:  [97.6 F (36.4 C)-99.9 F (37.7 C)] 97.6 F (36.4 C) (10/28 0620) Pulse Rate:  [58-68] 65 (10/28 0620) Resp:  [16-18] 18 (10/28 0620) BP: (128-147)/(49-65) 128/65 (10/28 0620) SpO2:  [91 %-92 %] 91 % (10/28 0714) Last BM Date: 01/01/20 General:   Frail-appearing Caucasian male NAD.  Daughter at bedside. Head:  Normocephalic and atraumatic. Eyes:  Sclera clear, no icterus.  Abdomen:  Soft, mild epigastric tenderness. Normal bowel sounds, without guarding, and without rebound.   Extremities:  Without clubbing, deformity or edema. Neurologic:  Alert and  oriented x4;  grossly normal neurologically.  Psych:  Alert and cooperative. Normal mood and affect.  Intake/Output from previous day: 10/27 0701 - 10/28 0700 In: -  Out: 400 [Urine:400] Intake/Output this shift: No intake/output data recorded.  Lab Results: CBC Recent Labs    01/02/20 0909 01/03/20 0313 01/04/20 0517  WBC 10.0 13.3* 16.4*  HGB 13.8 13.1 12.7*  HCT 43.8 41.9 38.7*  MCV 96.5 96.5 95.1  PLT 180 167 164   BMET Recent Labs    01/02/20 0909 01/03/20 0313 01/04/20 0517  NA 137 138 137  K 3.7 3.8 3.4*  CL 102 105 103  CO2 27 26 24   GLUCOSE 140* 103* 110*  BUN 23 23 25*  CREATININE 1.17 1.10 1.14  CALCIUM 8.7* 8.1* 8.3*   LFTs Recent Labs    01/02/20 0918 01/03/20 0313 01/04/20 0517  BILITOT 0.8 0.7 1.1  BILIDIR 0.2  --   --   IBILI 0.6  --   --   ALKPHOS 37* 33* 33*  AST 38 28 27  ALT 21 21 19   PROT 6.5 5.9* 5.8*  ALBUMIN 3.8 3.3* 2.9*   Recent Labs    01/02/20 0918 01/03/20 1210 01/04/20 0517  LIPASE 1,755*  45 23   PT/INR Recent Labs    01/02/20 0909 01/03/20 0313  LABPROT 12.9 14.1  INR 1.0 1.1      Imaging Studies: MR Abdomen W or Wo Contrast  Result Date: 01/02/2020 CLINICAL DATA:  Right upper quadrant pain 2 weeks. CT and examinations. EXAM: MRI ABDOMEN WITHOUT AND WITH CONTRAST (INCLUDING MRCP) TECHNIQUE: Multiplanar multisequence MR imaging of the abdomen was performed both before and after the administration of intravenous contrast. Heavily T2-weighted images of the biliary and pancreatic ducts were obtained, and three-dimensional MRCP images were rendered by post processing. CONTRAST:  52mL GADAVIST GADOBUTROL 1 MMOL/ML IV SOLN COMPARISON:  CT and ultrasound examinations 01/02/1999 FINDINGS: Examination is quite limited due to respiratory motion. The postcontrast images and particularly are limited. Lower chest: The lung bases are grossly6 clear. No pulmonary lesions or pleural effusions. The heart is limits of normal in size but no pericardial effusion. Hepatobiliary: No hepatic lesions are identified. No intrahepatic biliary dilatation. Small amount of perihepatic fluid is noted. The gallbladder is moderately distended and demonstrates wall thickening, pericholecystic inflammatory changes and fluid. This also mild inflammatory changes involving the adjacent right hepatic lobe with a small interposed fluid collection. No gallstones are identified. Normal caliber and course of the common bile duct. No common bile duct stones are identified. Pancreas: Age related pancreatic atrophy  is noted. There are 2 small cystic lesions in the pancreas there is a 3 mm cyst in the head body junction region and a 10 mm cyst in the pancreatic tail. These are likely benign postinflammatory cyst or possibly side branch IPMNs. Given the patient's age I do not think any further imaging evaluation or follow-up is necessary. Normal caliber and course of the main pancreatic duct. No findings for pancreatitis. Spleen:   Normal size.  No focal lesions. Adrenals/Urinary Tract: The adrenal glands and kidneys are unremarkable. Stomach/Bowel: Small hiatal hernia noted. The stomach is otherwise unremarkable. The duodenum, visualized small bowel and visualized colon are unremarkable. There is some fluid/inflammation surrounding the second portion of the duodenum likely due to the gallbladder process. Vascular/Lymphatic: The aorta is normal in caliber. No dissection. The branch vessels are patent. The major venous structures are patent. No mesenteric or retroperitoneal mass or adenopathy. Other:  No ascites or abdominal wall hernia. Musculoskeletal: No significant bony findings. IMPRESSION: 1. Moderately distended gallbladder with wall thickening, pericholecystic inflammatory changes and fluid. This is most likely due to acute acalculous cholecystitis. No common bile duct stones. Nuclear medicine hepatobiliary scan may be helpful for further evaluation. 2. Normal pancreaticobiliary tree. 3. Small pancreatic cystic lesions measuring up to 10 mm. These are likely benign postinflammatory cyst or possibly side branch IPMNs. Given the patient's age I do not think any further imaging evaluation or follow-up is necessary. 4. Small hiatal hernia. Electronically Signed   By: Marijo Sanes M.D.   On: 01/02/2020 14:39   MR 3D Recon At Scanner  Result Date: 01/02/2020 CLINICAL DATA:  Right upper quadrant pain 2 weeks. CT and examinations. EXAM: MRI ABDOMEN WITHOUT AND WITH CONTRAST (INCLUDING MRCP) TECHNIQUE: Multiplanar multisequence MR imaging of the abdomen was performed both before and after the administration of intravenous contrast. Heavily T2-weighted images of the biliary and pancreatic ducts were obtained, and three-dimensional MRCP images were rendered by post processing. CONTRAST:  52mL GADAVIST GADOBUTROL 1 MMOL/ML IV SOLN COMPARISON:  CT and ultrasound examinations 01/02/1999 FINDINGS: Examination is quite limited due to respiratory  motion. The postcontrast images and particularly are limited. Lower chest: The lung bases are grossly6 clear. No pulmonary lesions or pleural effusions. The heart is limits of normal in size but no pericardial effusion. Hepatobiliary: No hepatic lesions are identified. No intrahepatic biliary dilatation. Small amount of perihepatic fluid is noted. The gallbladder is moderately distended and demonstrates wall thickening, pericholecystic inflammatory changes and fluid. This also mild inflammatory changes involving the adjacent right hepatic lobe with a small interposed fluid collection. No gallstones are identified. Normal caliber and course of the common bile duct. No common bile duct stones are identified. Pancreas: Age related pancreatic atrophy is noted. There are 2 small cystic lesions in the pancreas there is a 3 mm cyst in the head body junction region and a 10 mm cyst in the pancreatic tail. These are likely benign postinflammatory cyst or possibly side branch IPMNs. Given the patient's age I do not think any further imaging evaluation or follow-up is necessary. Normal caliber and course of the main pancreatic duct. No findings for pancreatitis. Spleen:  Normal size.  No focal lesions. Adrenals/Urinary Tract: The adrenal glands and kidneys are unremarkable. Stomach/Bowel: Small hiatal hernia noted. The stomach is otherwise unremarkable. The duodenum, visualized small bowel and visualized colon are unremarkable. There is some fluid/inflammation surrounding the second portion of the duodenum likely due to the gallbladder process. Vascular/Lymphatic: The aorta is normal in caliber. No  dissection. The branch vessels are patent. The major venous structures are patent. No mesenteric or retroperitoneal mass or adenopathy. Other:  No ascites or abdominal wall hernia. Musculoskeletal: No significant bony findings. IMPRESSION: 1. Moderately distended gallbladder with wall thickening, pericholecystic inflammatory changes  and fluid. This is most likely due to acute acalculous cholecystitis. No common bile duct stones. Nuclear medicine hepatobiliary scan may be helpful for further evaluation. 2. Normal pancreaticobiliary tree. 3. Small pancreatic cystic lesions measuring up to 10 mm. These are likely benign postinflammatory cyst or possibly side branch IPMNs. Given the patient's age I do not think any further imaging evaluation or follow-up is necessary. 4. Small hiatal hernia. Electronically Signed   By: Marijo Sanes M.D.   On: 01/02/2020 14:39   CT Angio Chest/Abd/Pel for Dissection W and/or Wo Contrast  Result Date: 01/02/2020 CLINICAL DATA:  Chest and abdominal pain EXAM: CT ANGIOGRAPHY CHEST, ABDOMEN AND PELVIS TECHNIQUE: Non-contrast CT of the chest was initially obtained. Multidetector CT imaging through the chest, abdomen and pelvis was performed using the standard protocol during bolus administration of intravenous contrast. Multiplanar reconstructed images and MIPs were obtained and reviewed to evaluate the vascular anatomy. CONTRAST:  14mL OMNIPAQUE IOHEXOL 350 MG/ML SOLN COMPARISON:  Chest CT April 25, 2009 FINDINGS: CTA CHEST FINDINGS Cardiovascular: There is no intramural hematoma on noncontrast enhanced study. There is no evident mediastinal hematoma. There is no thoracic aortic aneurysm or dissection. There are scattered foci of calcification in visualized great vessels. There is aortic atherosclerosis as well as scattered foci of coronary artery calcification. There is no appreciable pericardial effusion or pericardial thickening. No demonstrable pulmonary embolus. Mediastinum/Nodes: Thyroid appears normal. There is no appreciable thoracic adenopathy. There is a focal hiatal hernia. Lungs/Pleura: There is scarring in each lung apex. More severe on the right than on the left. A degree of superimposed infiltrate in the apical segment of the right upper lobe cannot be excluded. There are scattered areas of  scarring and bullous disease in the upper lobe regions. There is patchy bibasilar atelectatic change. No appreciable pleural effusions. There is mild bronchiectatic change in the right middle lobe. Musculoskeletal: No blastic or lytic bone lesions. No appreciable fracture or dislocation. No chest wall lesions. Review of the MIP images confirms the above findings. CTA ABDOMEN AND PELVIS FINDINGS VASCULAR Aorta: No evident abdominal aortic aneurysm or dissection. There is atherosclerotic calcification throughout the course of the aorta without hemodynamically significant obstruction. Celiac: The celiac artery and its branches show no evident dissection or aneurysm. There is moderate plaque at the origin of the celiac artery. Other major branches of the celiac artery appear widely patent. SMA: Superior mesenteric artery and its branches are patent with slight atherosclerotic plaque at the origin of the superior mesenteric artery. Major superior mesenteric artery branches elsewhere are patent. No aneurysm or dissection involving the superior mesenteric artery or its branches. Renals: There is a single renal artery on each side. There is atherosclerotic calcification at the origin the right renal artery causing approximately 70% diameter stenosis. There is 75-80% diameter stenosis at the origin of the left renal artery. No aneurysm or dissection involving the renal arteries and their branches. No fibromuscular dysplasia evident. IMA: Inferior mesenteric artery is patent although there is moderate atherosclerotic plaque at the origin of this vessel. More distally, branches of the inferior mesenteric artery are patent. No aneurysm or dissection involving these branches. Inflow: There is atherosclerotic plaque in both common iliac arteries with several areas of 40-50% diameter stenosis  in these arteries. There are foci of atherosclerotic plaque in both internal iliac and external iliac arteries with moderate least severe  narrowing at the origin of the right and left hypogastric arteries but less than 50% diameter stenosis in the external iliac arteries. There is mild atherosclerotic plaque in each common femoral artery with less than 50% diameter stenosis. There is atherosclerotic plaque in each proximal superficial femoral artery with areas of 50-60% diameter stenosis. No aneurysm or dissection involving these vessels. Veins: No obvious venous abnormality within the limitations of this arterial phase study. Review of the MIP images confirms the above findings. NON-VASCULAR Hepatobiliary: Fatty infiltration is noted in the liver. There is a probable cyst near the dome of the liver on the right measuring 6 mm. Note that the wall of the gallbladder appears mildly thickened and equivocally edematous. No appreciable biliary duct dilatation evident. Pancreas: There is no pancreatic mass or inflammatory focus. Spleen: No splenic lesions are evident. Adrenals/Urinary Tract: Adrenals bilaterally appear normal. There is no appreciable renal mass or hydronephrosis on either side. No appreciable renal or ureteral calculus evident on either side. Note that contrast within the collecting systems and ureters may mask smaller calculi. Urinary bladder wall thickness is within normal limits. Stomach/Bowel: There are sigmoid diverticula without evident diverticulitis. There is no appreciable bowel wall or mesenteric thickening. No evident bowel obstruction. The terminal ileum appears normal. There is no appreciable free air or portal venous air. Lymphatic: There is no evident adenopathy in the abdomen or pelvis. Reproductive: Prostate and seminal vesicles are normal in size and configuration. Other: Appendix appears normal. No appreciable abscess or ascites in the abdomen or pelvis. Musculoskeletal: There is degenerative change in the thoracic spine. There is 4 mm of anterolisthesis of L3 on L4, felt to be due to underlying spondylosis. No evident  blastic or lytic bone lesions. No intramuscular or abdominal wall lesions are evident. Review of the MIP images confirms the above findings. IMPRESSION: CT angiogram chest: 1. No demonstrable thoracic aortic aneurysm or dissection. No mediastinal hematoma or intramural lesion. There is aortic atherosclerosis as well as foci of great vessel and coronary artery calcification. 2. Apical scarring bilaterally. A degree of superimposed infiltrate in the apical segment right upper lobe cannot be excluded. Scattered areas of atelectatic change. Bullous disease in the apices noted. Mild bronchiectasis right middle lobe. 3. Hiatal hernia present. 4. No demonstrable thoracic adenopathy. CT angiogram abdomen; CT angiogram pelvis: 1. Atherosclerotic calcification throughout the aorta as well as at multiple sites in mesenteric and pelvic arterial vessels. Atherosclerotic plaque is most notable in the proximal renal arteries as well as in the common and internal iliac arteries bilaterally. No aneurysm or dissection evident in the aorta, major mesenteric, and major pelvic arterial vessels. No evident fibromuscular dysplasia. 2. Gallbladder wall appears mildly thickened with questionable gallbladder wall edema. This appearance potentially could indicate a degree of acalculus cholecystitis. Ultrasound evaluation of gallbladder advised in this regard. 3.  Fatty infiltration in liver. 4. Sigmoid diverticula without diverticulitis. No bowel wall thickening or bowel obstruction. No abscess in the abdomen or pelvis. Appendix appears normal. 5. No hydronephrosis on either side. No renal or ureteral calculi evident. Note that small calculi could be masked by intravenous contrast. No urinary bladder wall thickening. Aortic Atherosclerosis (ICD10-I70.0). Electronically Signed   By: Lowella Grip III M.D.   On: 01/02/2020 09:21   US Abdomen Limited RUQ (LIVER/GB)  Result Date: 01/02/2020 CLINICAL DATA:  Biliary colic, abnormal CT  results. EXAM:  ULTRASOUND ABDOMEN LIMITED RIGHT UPPER QUADRANT COMPARISON:  01/02/2020 CTA chest, abdomen and pelvis. 03/10/2005 CT abdomen report. FINDINGS: Gallbladder: No gallstones visualized. The gallbladder wall is diffusely thickened and edematous measuring 7.6 mm. Pericholecystic free fluid is present. No sonographic Murphy sign noted by sonographer. Common bile duct: Diameter: 3.5 mm Liver: Ill-defined hepatic segment 5 hypervascular region along the gallbladder fossa (ultrasound image 16/65). Mildly increased parenchymal echogenicity. Trace perihepatic free fluid is present. Portal vein is patent on color Doppler imaging with normal direction of blood flow towards the liver. Other: Please note that limited patient mobility limits evaluation. IMPRESSION: Ill-defined hepatic segment 5 hypervascular region correlating with heterogenous enhancement seen on prior same day CT (CT 7:100). While this may reflect reactive parenchymal inflammation, MRI abdomen with and without contrast is recommended to exclude neoplasm. Diffuse gallbladder wall inflammation with small volume pericholecystic free fluid. No gallstones or biliary dilatation. Electronically Signed   By: Primitivo Gauze M.D.   On: 01/02/2020 11:30  [2 weeks]   Assessment: Pleasant 86 year old male presenting with acute onset epigastric pain/chest pain, imaging suggesting acalculus cholecystitis with elevation of lipase (1755) also. No evidence of pancreatitis on imaging.  Abdominal pain: Felt to be due to acalculus cholecystitis with possible pancreatitis.  Interestingly his lipase was 1755, the following day down to 45.  No evidence of inflammation of the pancreas on imaging although this can often be delayed.  LFTs have remained normal.  White count initially was 10,000, yesterday 13,300 and today up to 16,400.  Patient has been on IV Zosyn.  Remains afebrile with T-max of 99.9.  Pancreatic cyst: 2 small cystic lesions in the pancreas,  there is a 3 mm cyst in the head body junction region and a 10 mm cyst in the pancreatic tail.  Likely benign postinflammatory cyst or possibly sidebranch IPMN's.  Given patient's age no further imaging necessary.  Normal caliber and course of the main pancreatic duct.  Plan: 1. Follow labs, WBCs.  2. Continue Zosyn. 3. Management of diet per surgery in setting of acalculus cholecystitis.  4. Will follow with you.   Laureen Ochs. Bernarda Caffey [facility] [phone] 10/28/202111:11 AM     LOS: 2 days

## 2020-01-04 NOTE — Progress Notes (Signed)
Patient sats this evening were 94% on 1L.  This patient needs to be kept on 1L to keep sat goal of 92%.  If trail must be done without O2, patient needs to be monitored closely for desat episode.

## 2020-01-04 NOTE — Progress Notes (Addendum)
PROGRESS NOTE   Carlos Brooks  VHQ:[id number] DOB: Mar 28, 1926 DOA: 01/02/2020 PCP: Bennie Pierini, FNP   Chief Complaint  Patient presents with  . Chest Pain    Brief Admission History:  86 y/o male presents with abdominal pain found to have acalculous cholecystitis and pancreatitis and poor operative candidate.    Assessment & Plan:   Active Problems:   Pancreatitis   Acalculous cholecystitis  1. Acalculous cholecystitis - Pt is responding favorably to supportive management and IV antibiotics.  Continue for now.  PT evaluation requested, up to chair with assist.  2. Leukocytosis - suspect reactive, no evidence of infection found - follow with current management.  3. Pancreatitis - epigastric pain remains stable, not any worse, he presented with markedly elevated lipase and epigastric abd pain.  GI consult requested.  Lipase has normalized and he is tolerating clear liquid diet.  Advance diet per GI and surgery recs.  4. Hypokalemia - oral replacement given, Magnesium within normal limits.  Recheck in AM.   DVT prophylaxis: enoxaparin Code Status: full  Family Communication: daughter at bedside  Disposition: TBD  Status is: Inpatient  Remains inpatient appropriate because:IV treatments appropriate due to intensity of illness or inability to take PO and Inpatient level of care appropriate due to severity of illness  Dispo: The patient is from: Home              Anticipated d/c is to: TBD              Anticipated d/c date is: 1-2 days              Patient currently is not medically stable to d/c.  Consultants:   Surgery  GI  Procedures:     Antimicrobials:  Zosyn 10/26>>  Subjective: Pt says he feels about the same as yesterday. Abdomen still hurts some.  He does want to advance diet, he is tolerating clears with no problem.     Objective: Vitals:   01/03/20 1926 01/03/20 2115 01/04/20 0620 01/04/20 0714  BP: (!) 147/49  128/65   Pulse: 68  65     Resp: 16  18   Temp: 99.9 F (37.7 C)  97.6 F (36.4 C)   TempSrc: Oral  Oral   SpO2: 92% 92% 91% 91%  Weight:      Height:        Intake/Output Summary (Last 24 hours) at 01/04/2020 1053 Last data filed at 01/03/2020 2300 Gross per 24 hour  Intake --  Output 400 ml  Net -400 ml   Filed Weights   01/02/20 0806 01/02/20 1650  Weight: 63.5 kg 62.3 kg    Examination:  General exam: Elderly male, awake, alert, Appears calm and comfortable, NAD.  Respiratory system:  Respiratory effort normal. Cardiovascular system: normal S1 & S2 heard. Gastrointestinal system: Abdomen is nondistended, soft and persistent mild epigastric tenderness with deep palpation. No guarding. No organomegaly or masses felt.  Normal bowel sounds heard. Central nervous system: Alert and oriented x 2. No focal neurological deficits. Extremities: Symmetric 5 x 5 power.  Skin: No rashes, lesions or ulcers Psychiatry:  Mood & affect appropriate.   Data Reviewed: I have personally reviewed following labs and imaging studies  CBC: Recent Labs  Lab 01/02/20 0909 01/03/20 0313 01/04/20 0517  WBC 10.0 13.3* 16.4*  NEUTROABS  --   --  13.0*  HGB 13.8 13.1 12.7*  HCT 43.8 41.9 38.7*  MCV 96.5 96.5 95.1  PLT 180 167  164    Basic Metabolic Panel: Recent Labs  Lab 01/02/20 0909 01/03/20 0313 01/04/20 0517  NA 137 138 137  K 3.7 3.8 3.4*  CL 102 105 103  CO2 27 26 24   GLUCOSE 140* 103* 110*  BUN 23 23 25*  CREATININE 1.17 1.10 1.14  CALCIUM 8.7* 8.1* 8.3*  MG  --   --  2.1    GFR: Estimated Creatinine Clearance: 37.2 mL/min (by C-G formula based on SCr of 1.14 mg/dL).  Liver Function Tests: Recent Labs  Lab 01/02/20 0918 01/03/20 0313 01/04/20 0517  AST 38 28 27  ALT 21 21 19   ALKPHOS 37* 33* 33*  BILITOT 0.8 0.7 1.1  PROT 6.5 5.9* 5.8*  ALBUMIN 3.8 3.3* 2.9*    CBG: No results for input(s): GLUCAP in the last 168 hours.  Recent Results (from the past 240 hour(s))  Respiratory  Panel by RT PCR (Flu A&B, Covid) - Nasopharyngeal Swab     Status: None   Collection Time: 01/02/20 11:59 AM   Specimen: Nasopharyngeal Swab  Result Value Ref Range Status   SARS Coronavirus 2 by RT PCR NEGATIVE NEGATIVE Final    Comment: (NOTE) SARS-CoV-2 target nucleic acids are NOT DETECTED.  The SARS-CoV-2 RNA is generally detectable in upper respiratoy specimens during the acute phase of infection. The lowest concentration of SARS-CoV-2 viral copies this assay can detect is 131 copies/mL. A negative result does not preclude SARS-Cov-2 infection and should not be used as the sole basis for treatment or other patient management decisions. A negative result may occur with  improper specimen collection/handling, submission of specimen other than nasopharyngeal swab, presence of viral mutation(s) within the areas targeted by this assay, and inadequate number of viral copies (<131 copies/mL). A negative result must be combined with clinical observations, patient history, and epidemiological information. The expected result is Negative.  Fact Sheet for Patients:  https://www.moore.com/  Fact Sheet for Healthcare Providers:  https://www.young.biz/  This test is no t yet approved or cleared by the [place] FDA and  has been authorized for detection and/or diagnosis of SARS-CoV-2 by FDA under an Emergency Use Authorization (EUA). This EUA will remain  in effect (meaning this test can be used) for the duration of the COVID-19 declaration under Section 564(b)(1) of the Act, 21 U.S.C. section 360bbb-3(b)(1), unless the authorization is terminated or revoked sooner.     Influenza A by PCR NEGATIVE NEGATIVE Final   Influenza B by PCR NEGATIVE NEGATIVE Final    Comment: (NOTE) The Xpert Xpress SARS-CoV-2/FLU/RSV assay is intended as an aid in  the diagnosis of influenza from Nasopharyngeal swab specimens and  should not be used as a sole basis  for treatment. Nasal washings and  aspirates are unacceptable for Xpert Xpress SARS-CoV-2/FLU/RSV  testing.  Fact Sheet for Patients: https://www.moore.com/  Fact Sheet for Healthcare Providers: https://www.young.biz/  This test is not yet approved or cleared by the [place] FDA and  has been authorized for detection and/or diagnosis of SARS-CoV-2 by  FDA under an Emergency Use Authorization (EUA). This EUA will remain  in effect (meaning this test can be used) for the duration of the  Covid-19 declaration under Section 564(b)(1) of the Act, 21  U.S.C. section 360bbb-3(b)(1), unless the authorization is  terminated or revoked. Performed at [facility], [address], [place], [place] [address]      Radiology Studies: MR Abdomen W or Wo Contrast  Result Date: 01/02/2020 CLINICAL DATA:  Right upper quadrant pain  2 weeks. CT and examinations. EXAM: MRI ABDOMEN WITHOUT AND WITH CONTRAST (INCLUDING MRCP) TECHNIQUE: Multiplanar multisequence MR imaging of the abdomen was performed both before and after the administration of intravenous contrast. Heavily T2-weighted images of the biliary and pancreatic ducts were obtained, and three-dimensional MRCP images were rendered by post processing. CONTRAST:  7mL GADAVIST GADOBUTROL 1 MMOL/ML IV SOLN COMPARISON:  CT and ultrasound examinations 01/02/1999 FINDINGS: Examination is quite limited due to respiratory motion. The postcontrast images and particularly are limited. Lower chest: The lung bases are grossly6 clear. No pulmonary lesions or pleural effusions. The heart is limits of normal in size but no pericardial effusion. Hepatobiliary: No hepatic lesions are identified. No intrahepatic biliary dilatation. Small amount of perihepatic fluid is noted. The gallbladder is moderately distended and demonstrates wall thickening, pericholecystic inflammatory changes and fluid. This also mild inflammatory changes  involving the adjacent right hepatic lobe with a small interposed fluid collection. No gallstones are identified. Normal caliber and course of the common bile duct. No common bile duct stones are identified. Pancreas: Age related pancreatic atrophy is noted. There are 2 small cystic lesions in the pancreas there is a 3 mm cyst in the head body junction region and a 10 mm cyst in the pancreatic tail. These are likely benign postinflammatory cyst or possibly side branch IPMNs. Given the patient's age I do not think any further imaging evaluation or follow-up is necessary. Normal caliber and course of the main pancreatic duct. No findings for pancreatitis. Spleen:  Normal size.  No focal lesions. Adrenals/Urinary Tract: The adrenal glands and kidneys are unremarkable. Stomach/Bowel: Small hiatal hernia noted. The stomach is otherwise unremarkable. The duodenum, visualized small bowel and visualized colon are unremarkable. There is some fluid/inflammation surrounding the second portion of the duodenum likely due to the gallbladder process. Vascular/Lymphatic: The aorta is normal in caliber. No dissection. The branch vessels are patent. The major venous structures are patent. No mesenteric or retroperitoneal mass or adenopathy. Other:  No ascites or abdominal wall hernia. Musculoskeletal: No significant bony findings. IMPRESSION: 1. Moderately distended gallbladder with wall thickening, pericholecystic inflammatory changes and fluid. This is most likely due to acute acalculous cholecystitis. No common bile duct stones. Nuclear medicine hepatobiliary scan may be helpful for further evaluation. 2. Normal pancreaticobiliary tree. 3. Small pancreatic cystic lesions measuring up to 10 mm. These are likely benign postinflammatory cyst or possibly side branch IPMNs. Given the patient's age I do not think any further imaging evaluation or follow-up is necessary. 4. Small hiatal hernia. Electronically Signed   By: Rudie Meyer  M.D.   On: 01/02/2020 14:39   MR 3D Recon At Scanner  Result Date: 01/02/2020 CLINICAL DATA:  Right upper quadrant pain 2 weeks. CT and examinations. EXAM: MRI ABDOMEN WITHOUT AND WITH CONTRAST (INCLUDING MRCP) TECHNIQUE: Multiplanar multisequence MR imaging of the abdomen was performed both before and after the administration of intravenous contrast. Heavily T2-weighted images of the biliary and pancreatic ducts were obtained, and three-dimensional MRCP images were rendered by post processing. CONTRAST:  7mL GADAVIST GADOBUTROL 1 MMOL/ML IV SOLN COMPARISON:  CT and ultrasound examinations 01/02/1999 FINDINGS: Examination is quite limited due to respiratory motion. The postcontrast images and particularly are limited. Lower chest: The lung bases are grossly6 clear. No pulmonary lesions or pleural effusions. The heart is limits of normal in size but no pericardial effusion. Hepatobiliary: No hepatic lesions are identified. No intrahepatic biliary dilatation. Small amount of perihepatic fluid is noted. The gallbladder is moderately distended and  demonstrates wall thickening, pericholecystic inflammatory changes and fluid. This also mild inflammatory changes involving the adjacent right hepatic lobe with a small interposed fluid collection. No gallstones are identified. Normal caliber and course of the common bile duct. No common bile duct stones are identified. Pancreas: Age related pancreatic atrophy is noted. There are 2 small cystic lesions in the pancreas there is a 3 mm cyst in the head body junction region and a 10 mm cyst in the pancreatic tail. These are likely benign postinflammatory cyst or possibly side branch IPMNs. Given the patient's age I do not think any further imaging evaluation or follow-up is necessary. Normal caliber and course of the main pancreatic duct. No findings for pancreatitis. Spleen:  Normal size.  No focal lesions. Adrenals/Urinary Tract: The adrenal glands and kidneys are  unremarkable. Stomach/Bowel: Small hiatal hernia noted. The stomach is otherwise unremarkable. The duodenum, visualized small bowel and visualized colon are unremarkable. There is some fluid/inflammation surrounding the second portion of the duodenum likely due to the gallbladder process. Vascular/Lymphatic: The aorta is normal in caliber. No dissection. The branch vessels are patent. The major venous structures are patent. No mesenteric or retroperitoneal mass or adenopathy. Other:  No ascites or abdominal wall hernia. Musculoskeletal: No significant bony findings. IMPRESSION: 1. Moderately distended gallbladder with wall thickening, pericholecystic inflammatory changes and fluid. This is most likely due to acute acalculous cholecystitis. No common bile duct stones. Nuclear medicine hepatobiliary scan may be helpful for further evaluation. 2. Normal pancreaticobiliary tree. 3. Small pancreatic cystic lesions measuring up to 10 mm. These are likely benign postinflammatory cyst or possibly side branch IPMNs. Given the patient's age I do not think any further imaging evaluation or follow-up is necessary. 4. Small hiatal hernia. Electronically Signed   By: Rudie Meyer M.D.   On: 01/02/2020 14:39   US Abdomen Limited RUQ (LIVER/GB)  Result Date: 01/02/2020 CLINICAL DATA:  Biliary colic, abnormal CT results. EXAM: ULTRASOUND ABDOMEN LIMITED RIGHT UPPER QUADRANT COMPARISON:  01/02/2020 CTA chest, abdomen and pelvis. 03/10/2005 CT abdomen report. FINDINGS: Gallbladder: No gallstones visualized. The gallbladder wall is diffusely thickened and edematous measuring 7.6 mm. Pericholecystic free fluid is present. No sonographic Murphy sign noted by sonographer. Common bile duct: Diameter: 3.5 mm Liver: Ill-defined hepatic segment 5 hypervascular region along the gallbladder fossa (ultrasound image 16/65). Mildly increased parenchymal echogenicity. Trace perihepatic free fluid is present. Portal vein is patent on color  Doppler imaging with normal direction of blood flow towards the liver. Other: Please note that limited patient mobility limits evaluation. IMPRESSION: Ill-defined hepatic segment 5 hypervascular region correlating with heterogenous enhancement seen on prior same day CT (CT 7:100). While this may reflect reactive parenchymal inflammation, MRI abdomen with and without contrast is recommended to exclude neoplasm. Diffuse gallbladder wall inflammation with small volume pericholecystic free fluid. No gallstones or biliary dilatation. Electronically Signed   By: Stana Bunting M.D.   On: 01/02/2020 11:30   Scheduled Meds: . donepezil  5 mg Oral QHS  . enoxaparin (LOVENOX) injection  40 mg Subcutaneous Q24H  . finasteride  5 mg Oral Daily  . fluticasone  2 spray Each Nare Daily  . fluticasone furoate-vilanterol  1 puff Inhalation Daily  . ipratropium  0.5 mg Nebulization TID  . LORazepam  0.5 mg Oral QHS  . memantine  5 mg Oral TID  . saccharomyces boulardii  250 mg Oral BID  . terazosin  5 mg Oral QHS   Continuous Infusions: . sodium chloride 35 mL/hr at  01/04/20 1013  . piperacillin-tazobactam (ZOSYN)  IV 3.375 g (01/04/20 1018)     LOS: 2 days   Time spent: 30 minutes   Ailyne Pawley Laural Benes, MD How to contact the [facility] Attending or Consulting provider 7A - 7P or covering provider during after hours 7P -7A, for this patient?  1. Check the care team in [facility] and look for a) attending/consulting [facility] provider listed and b) the [facility] team listed 2. Log into [email or web address] and use [place]'s universal password to access. If you do not have the password, please contact the hospital operator. 3. Locate the [facility] provider you are looking for under [facility] and page to a number that you can be directly reached. 4. If you still have difficulty reaching the provider, please page the [facility] (Director on Call) for the Hospitalists listed on amion for assistance.  01/04/2020, 10:53 AM

## 2020-01-04 NOTE — Progress Notes (Signed)
Patients sats were 89% on room air. Per order sat goal is for 92 and greater. RT placed patient back on 1LNC with sats returning to 92%.

## 2020-01-04 NOTE — Evaluation (Signed)
Physical Therapy Evaluation Patient Details Name: Carlos Brooks MRN: [id number] DOB: April 24, 1926 Today's Date: 01/04/2020   History of Present Illness  86 year old male prior colon polyps external hemorrhoids 12/2001, esophageal stricture status post dilatation 2006--- was recommended by [name] 04/05/2019 barium esophagram not really a good candidate for scoping     Clinical Impression  Patient limited for functional mobility as stated below secondary to BLE weakness, fatigue and impaired standing balance. Patient requires min assist with bed mobility to transition to seated EOB with pull to sitting secondary to weakness and fatigue. Patient able to transfer to standing without use of RW but is unsteady upon standing which improves with use of RW. Patient able to ambulate into bathroom and is unsteady while standing to urinate requiring UE support on wall for balance. Patient ambulates to chair with slightly unsteady cadence with fatigue noted. Patient ended session seated in chair and was very happy to be sitting. Patient's daughter was present for session. Patient will benefit from continued physical therapy in hospital and recommended venue below to increase strength, balance, endurance for safe ADLs and gait.      Follow Up Recommendations Home health PT;Supervision for mobility/OOB;Supervision - Intermittent    Equipment Recommendations  Rolling walker with 5" wheels    Recommendations for Other Services       Precautions / Restrictions Precautions Precautions: Fall Restrictions Weight Bearing Restrictions: No      Mobility  Bed Mobility Overal bed mobility: Needs Assistance Bed Mobility: Supine to Sit     Supine to sit: Min assist     General bed mobility comments: pull to seated EOB with min A    Transfers Overall transfer level: Needs assistance Equipment used: Rolling walker (2 wheeled) Transfers: Sit to/from [facility] Sit to Stand: Min  assist Stand pivot transfers: Min assist       General transfer comment: patient transfers to standing with min assist, unsteady upon stranding initially but reaches for RW to improve balance  Ambulation/Gait Ambulation/Gait assistance: Min assist Gait Distance (Feet): 15 Feet Assistive device: Rolling walker (2 wheeled) Gait Pattern/deviations: Step-through pattern;Decreased step length - right;Decreased step length - left;Decreased stride length Gait velocity: decrease   General Gait Details: slow, labored, unsteady with RW with min assist for balance and safety  Stairs            Wheelchair Mobility    Modified Rankin (Stroke Patients Only)       Balance Overall balance assessment: Needs assistance Sitting-balance support: Feet supported Sitting balance-Leahy Scale: Normal Sitting balance - Comments: seated EOB   Standing balance support: Bilateral upper extremity supported Standing balance-Leahy Scale: Fair Standing balance comment: fair with RW                             Pertinent Vitals/Pain Pain Assessment: No/denies pain    Home Living Family/patient expects to be discharged to:: Private residence Living Arrangements: Spouse/significant other Available Help at Discharge: Family;Available PRN/intermittently           Home Equipment: Cane - quad      Prior Function Level of Independence: Independent         Comments: Patient and daughter state patient is independent,  [employer] manager        Extremity/Trunk Assessment   Upper Extremity Assessment Upper Extremity Assessment: Generalized weakness    Lower Extremity Assessment Lower Extremity Assessment: Generalized weakness  Cervical / Trunk Assessment Cervical / Trunk Assessment: Normal  Communication   Communication: HOH;No difficulties  Cognition Arousal/Alertness: Awake/alert Behavior During Therapy: WFL for tasks  assessed/performed Overall Cognitive Status: Within Functional Limits for tasks assessed                                        General Comments      Exercises     Assessment/Plan    PT Assessment Patient needs continued PT services  PT Problem List Decreased strength;Decreased mobility;Decreased activity tolerance;Decreased knowledge of use of DME;Decreased balance;Cardiopulmonary status limiting activity       PT Treatment Interventions DME instruction;Therapeutic exercise;Gait training;Balance training;Stair training;Neuromuscular re-education;Functional mobility training;Therapeutic activities;Patient/family education    PT Goals (Current goals can be found in the Care Plan section)  Acute Rehab PT Goals Patient Stated Goal: Return home PT Goal Formulation: With patient/family Time For Goal Achievement: 01/18/20 Potential to Achieve Goals: Good    Frequency Min 3X/week   Barriers to discharge        Co-evaluation               AM-PAC PT "6 Clicks" Mobility  Outcome Measure Help needed turning from your back to your side while in a flat bed without using bedrails?: A Little Help needed moving from lying on your back to sitting on the side of a flat bed without using bedrails?: A Little Help needed moving to and from a bed to a chair (including a wheelchair)?: A Little Help needed standing up from a chair using your arms (e.g., wheelchair or bedside chair)?: A Little Help needed to walk in hospital room?: A Lot Help needed climbing 3-5 steps with a railing? : A Lot 6 Click Score: 16    End of Session Equipment Utilized During Treatment: Gait belt Activity Tolerance: Patient tolerated treatment well Patient left: in chair;with call bell/phone within reach;with family/visitor present Nurse Communication: Mobility status PT Visit Diagnosis: Unsteadiness on feet (R26.81);Other abnormalities of gait and mobility (R26.89);Muscle weakness  (generalized) (M62.81)    Time: 7939-0300 PT Time Calculation (min) (ACUTE ONLY): 21 min   Charges:   PT Evaluation $PT Eval Moderate Complexity: 1 Mod PT Treatments $Therapeutic Activity: 8-22 mins        2:34 PM, 01/04/20 Mearl Latin PT, DPT Physical Therapist at [facility]

## 2020-01-05 DIAGNOSIS — R1013 Epigastric pain: Secondary | ICD-10-CM

## 2020-01-05 DIAGNOSIS — K8050 Calculus of bile duct without cholangitis or cholecystitis without obstruction: Secondary | ICD-10-CM

## 2020-01-05 DIAGNOSIS — E538 Deficiency of other specified B group vitamins: Secondary | ICD-10-CM

## 2020-01-05 LAB — COMPREHENSIVE METABOLIC PANEL
ALT: 17 U/L (ref 0–44)
AST: 22 U/L (ref 15–41)
Albumin: 2.7 g/dL — ABNORMAL LOW (ref 3.5–5.0)
Alkaline Phosphatase: 37 U/L — ABNORMAL LOW (ref 38–126)
Anion gap: 10 (ref 5–15)
BUN: 24 mg/dL — ABNORMAL HIGH (ref 8–23)
CO2: 25 mmol/L (ref 22–32)
Calcium: 8.3 mg/dL — ABNORMAL LOW (ref 8.9–10.3)
Chloride: 103 mmol/L (ref 98–111)
Creatinine, Ser: 1.01 mg/dL (ref 0.61–1.24)
GFR, Estimated: >60 mL/min (ref >60)
Glucose, Bld: 102 mg/dL — ABNORMAL HIGH (ref 70–99)
Potassium: 3.6 mmol/L (ref 3.5–5.1)
Sodium: 138 mmol/L (ref 135–145)
Total Bilirubin: 1.3 mg/dL — ABNORMAL HIGH (ref 0.3–1.2)
Total Protein: 5.5 g/dL — ABNORMAL LOW (ref 6.5–8.1)

## 2020-01-05 LAB — CBC WITH DIFFERENTIAL/PLATELET
Abs Immature Granulocytes: 0.13 10*3/uL — ABNORMAL HIGH (ref 0.00–0.07)
Basophils Absolute: 0.1 10*3/uL (ref 0.0–0.1)
Basophils Relative: 1 %
Eosinophils Absolute: 0.1 10*3/uL (ref 0.0–0.5)
Eosinophils Relative: 1 %
HCT: 36.4 % — ABNORMAL LOW (ref 39.0–52.0)
Hemoglobin: 12 g/dL — ABNORMAL LOW (ref 13.0–17.0)
Immature Granulocytes: 1 %
Lymphocytes Relative: 8 %
Lymphs Abs: 1.2 10*3/uL (ref 0.7–4.0)
MCH: 30.6 pg (ref 26.0–34.0)
MCHC: 33 g/dL (ref 30.0–36.0)
MCV: 92.9 fL (ref 80.0–100.0)
Monocytes Absolute: 1.5 10*3/uL — ABNORMAL HIGH (ref 0.1–1.0)
Monocytes Relative: 11 %
Neutro Abs: 11.4 10*3/uL — ABNORMAL HIGH (ref 1.7–7.7)
Neutrophils Relative %: 78 %
Platelets: 151 10*3/uL (ref 150–400)
RBC: 3.92 MIL/uL — ABNORMAL LOW (ref 4.22–5.81)
RDW: 14.7 % (ref 11.5–15.5)
WBC: 14.4 10*3/uL — ABNORMAL HIGH (ref 4.0–10.5)
nRBC: 0 % (ref 0.0–0.2)

## 2020-01-05 LAB — LIPASE, BLOOD: Lipase: 20 U/L (ref 11–51)

## 2020-01-05 MED ORDER — HYDROMORPHONE HCL 1 MG/ML IJ SOLN: 0.2500 mg | INTRAMUSCULAR | Status: DC | PRN

## 2020-01-05 MED ORDER — HYDRALAZINE HCL 20 MG/ML IJ SOLN: 5.0000 mg | INTRAMUSCULAR | Status: DC | PRN

## 2020-01-05 MED ORDER — ENSURE ENLIVE PO LIQD
237.0000 mL | Freq: Two times a day (BID) | ORAL | Status: DC
  Administered 2020-01-05 – 2020-01-06 (×2): 237 mL via ORAL

## 2020-01-05 NOTE — Progress Notes (Addendum)
Agree with the below findings as written. The patient was presented to me by the APP (Advanced Practice Provider) and I have also seen and examined the patient independently. Please see my key portion of the encounter as documented.  Patient continues to clinically improve.  He will need low-fat diet going forward.  Continue to advance as tolerated.  Patient will need outpatient follow-up with his primary GI in 6 to 8 weeks after discharge.  GI to sign off, please call with any questions concerns.  Elon Alas. Abbey Chatters, D.O. Gastroenterology and Hepatology [facility]    Subjective: Feels better today.  Less abdominal pain.  Denies nausea and vomiting.  Had a lengthy discussion with the patient and his daughter about low-fat diet.  Per the request I will put in for a dietitian consult to discuss low-fat diet recommendations.  No bowel movement in 24 hours, has had minimal intake.  No other overt GI complaints.  Objective: Vital signs in last 24 hours: Temp:  [97.7 F (36.5 C)-99.8 F (37.7 C)] 97.8 F (36.6 C) (10/29 0910) Pulse Rate:  [57-65] 59 (10/29 0910) Resp:  [18] 18 (10/29 0910) BP: (150-181)/(51-59) 155/58 (10/29 0910) SpO2:  [90 %-97 %] 97 % (10/29 0910) Last BM Date: 01/02/20 General:   Alert and pleasant Head:  Normocephalic and atraumatic. Eyes:  No icterus, sclera clear. Conjuctiva pink.  Heart:  S1, S2 present, no murmurs noted.  Lungs: Clear to auscultation bilaterally, without wheezing, rales, or rhonchi.  Abdomen:  Bowel sounds present, soft, non-tender, non-distended. No HSM or hernias noted. No rebound or guarding. No masses appreciated  Msk:  Symmetrical without gross deformities. Pulses:  Normal bilateral pulses noted. Extremities:  Without clubbing or edema. Neurologic:  Grossly normal neurologically. Psych:  Alert and cooperative. Normal mood and affect.  Intake/Output from previous day: 10/28 0701 - 10/29 0700 In: 3725.6  [P.O.:120; I.V.:3291.7; IV Piggyback:313.9] Out: 200 [Urine:200] Intake/Output this shift: Total I/O In: 240 [P.O.:240] Out: -   Lab Results: Recent Labs    01/03/20 0313 01/04/20 0517 01/05/20 0532  WBC 13.3* 16.4* 14.4*  HGB 13.1 12.7* 12.0*  HCT 41.9 38.7* 36.4*  PLT 167 164 151   BMET Recent Labs    01/03/20 0313 01/04/20 0517 01/05/20 0532  NA 138 137 138  K 3.8 3.4* 3.6  CL 105 103 103  CO2 26 24 25   GLUCOSE 103* 110* 102*  BUN 23 25* 24*  CREATININE 1.10 1.14 1.01  CALCIUM 8.1* 8.3* 8.3*   LFT Recent Labs    01/03/20 0313 01/04/20 0517 01/05/20 0532  PROT 5.9* 5.8* 5.5*  ALBUMIN 3.3* 2.9* 2.7*  AST 28 27 22   ALT 21 19 17   ALKPHOS 33* 33* 37*  BILITOT 0.7 1.1 1.3*   PT/INR Recent Labs    01/03/20 0313  LABPROT 14.1  INR 1.1   Hepatitis Panel No results for input(s): HEPBSAG, HCVAB, HEPAIGM, HEPBIGM in the last 72 hours.   Studies/Results: No results found.  Assessment: Pleasant 86 year old male presenting with acute onset epigastric pain/chest pain, imaging suggesting acalculus cholecystitis with elevation of lipase (1755) also. No evidence of pancreatitis on imaging.   Abdominal pain: Felt to be due to acalculus cholecystitis with possible pancreatitis.  Interestingly his lipase was 1755, the following day down to 45; today lipase is 20.  No evidence of inflammation of the pancreas on imaging although this can often be delayed.  LFTs have remained mostly normal, although slight bump in bilirubin to 1.3 today.  White count initially was 10 -> 13.3 -> 16.4 (yesterday) and now down to 14.4.  Patient has been on IV Zosyn.  Had low grade temp 99.9 yesterday, this morning down to 97.8. Overall pain improved, clinically improved. Daughter is happy with the prospect of the patient going home possibly tomorrow.   Pancreatic cyst - Two small cystic lesions in the pancreas, there is a 3 mm cyst in the head body junction region and a 10 mm cyst in the  pancreatic tail.  Likely benign postinflammatory cyst or possibly sidebranch IPMN's.  Normal caliber and course of the main pancreatic duct. Given patient's age and patient/daughter's wishes, we will hold off on further imaging or work-up.   Plan: Supportive measures Dietician consult for guidance on low fat diet Outpatient follow-up with primary GI ([facility] vs. Transition to [facility]) 6-8 weeks after discharge Can advance diet to soft, low fat if tolerates full liquids We will sign off for now, please contact us if we can be of further assistance.   Thank you for allowing [place] to participate in the care of [place], DNP, AGNP-C Adult & Gerontological Nurse Practitioner [facility]    LOS: 3 days    01/05/2020, 11:00 AM

## 2020-01-05 NOTE — TOC Initial Note (Signed)
Transition of Care [facility]) - Initial/Assessment Note    Patient Details  Name: Carlos Brooks MRN: [id number] Date of Birth: 05-May-1926  Transition of Care [facility]) CM/SW Contact:    Boneta Lucks, RN Phone Number: 01/05/2020, 10:57 AM  Clinical Narrative:       Patient admitted with pancreatitis. PT recommending HHPT and rolling walker. TOC spoke with Katharine Look his daughter.  She is agreeing, Choices given, Patient has HTA insurance. Tim with [facility] accepted the referral for HHPT.  Katharine Look is fine with walker being delivered to the home. Rodney with [employer] given the referral to ship rolling walker.            Expected Discharge Plan: [place] Barriers to Discharge: Continued Medical Work up   Patient Goals and CMS Choice Patient states their goals for this hospitalization and ongoing recovery are:: to return home. CMS Medicare.gov Compare Post Acute Care list provided to:: Patient Represenative (must comment) Choice offered to / list presented to : Adult Children  Expected Discharge Plan and Services Expected Discharge Plan: [place] arrangements for the past 2 months: Single Family Home                 DME Arranged: Walker rolling DME Agency: [employer] Date DME Agency Contacted: 01/05/20 Time DME Agency Contacted: 0900 Representative spoke with at DME Agency: Barbaraann Rondo [place]: PT [place]: [place] (now [facility]) Date [place]: 01/05/20 Time [place]: 77 Representative spoke with at [place]: [place] Arrangements/Services Living arrangements for the past 2 months: [place] with:: Spouse   Do you feel safe going back to the place where you live?: Yes      Need for Family Participation in Patient Care: Yes (Comment) Care giver support system in place?: Yes (comment)   Criminal Activity/Legal Involvement Pertinent to Current  Situation/Hospitalization: No - Comment as needed  Activities of Daily Living Home Assistive Devices/Equipment: Dentures (specify type), CPAP ADL Screening (condition at time of admission) Patient's cognitive ability adequate to safely complete daily activities?: Yes Is the patient deaf or have difficulty hearing?: No Does the patient have difficulty seeing, even when wearing glasses/contacts?: No Does the patient have difficulty concentrating, remembering, or making decisions?: No Patient able to express need for assistance with ADLs?: Yes Does the patient have difficulty dressing or bathing?: No Independently performs ADLs?: Yes (appropriate for developmental age) Does the patient have difficulty walking or climbing stairs?: No Weakness of Legs: None Weakness of Arms/Hands: None  Permission Sought/Granted      Share Information with NAME: Katharine Look     Permission granted to share info w Relationship: [place]     Emotional Assessment     Affect (typically observed): Accepting Orientation: : Oriented to Self, Oriented to Place Alcohol / Substance Use: Not Applicable Psych Involvement: No (comment)  Admission diagnosis:  Pancreatitis [K85.90] Vitamin B12 deficiency [E53.8] Pain [N82] Biliary colic symptom [N56.21] Patient Active Problem List   Diagnosis Date Noted  . Pancreatitis 01/02/2020  . Acalculous cholecystitis   . Vitamin D deficiency 06/15/2019  . Tremor, coarse 02/17/2019  . Memory loss 02/16/2019  . Bruit 11/30/2018  . Thoracic aorta atherosclerosis ([place]) 10/17/2018  . Primary insomnia 10/17/2018  . Controlled substance agreement signed 10/17/2018  . LBBB (left bundle branch block) 07/21/2017  . Primary osteoarthritis involving multiple joints 08/10/2016  . Back pain 08/10/2016  .  Neck pain 08/10/2016  . Neuropathy 10/21/2015  . Aortic atherosclerosis ([place]) 10/21/2015  . Dyspnea 08/07/2015  . Difficulty swallowing solids 06/20/2007  . Allergic rhinitis  04/28/2007  . LUNG NODULE 04/28/2007  . Hyperlipidemia LDL goal <100 03/28/2007  . COPD (chronic obstructive pulmonary disease) with chronic bronchitis ([place]) 03/28/2007  . Gastroesophageal reflux disease without esophagitis 03/28/2007  . BPH (benign prostatic hyperplasia) 03/28/2007   PCP:  [name], FNP Pharmacy:   [place], [address] [place] [address] Phone: [phone] Fax: [phone]

## 2020-01-05 NOTE — Plan of Care (Signed)
Nutrition Education Note  RD consulted for nutrition education regarding low fat diet  RD met with pt and daughter this afternoon. Patient reports tolerating full liquid lunch, denied abdominal pain, nausea, daughter reports episode of diarrhea shortly after eating. He endorsed good appetite at baseline. Usually eats scrambled eggs prepared with a butter substitute and pancakes with syrup in the mornings, recalls banana and mayonnaise sandwiches for lunch, enjoys cereals, meals brought to his home by family or sometimes another banana sandwich for dinner. Pt regularly drinks nutrition supplements at home, enjoys all flavors. Will order Ensure BID while on full liquid diet to aid with meeting needs.    RD provided "Fat Restricted Nutrition Therapy" handout as well as "5 Sample Menus for Fat Restricted Diet" handout from the Academy of Nutrition and Dietetics. Reviewed patient's dietary recall and discussed ways for pt to meet nutrition goals.   Teach back method used. Pt verbalizes understanding of information provided.   Expect good compliance.  Body mass index is Body mass index is 21.51 kg/m.Marland Kitchen Pt meets criteria for normal based on current BMI.  Current diet order is full liquid, patient is consuming approximately 75% of meals at this time. Labs and medications reviewed. No further nutrition interventions warranted at this time. RD contact information provided. If additional nutrition issues arise, please re-consult RD.  Lajuan Lines, RD, LDN Clinical Nutrition After Hours/Weekend Pager # in [place]

## 2020-01-05 NOTE — Progress Notes (Signed)
Pharmacy Antibiotic Note  Today is day #4 of Zosyn therapy for this 86 yo male with intra-abdominal infection.  WBC count remains elevated and patient is currently afebrile.  Renal function is unstable.  Plan: Continue Zosyn 3.375g IV q8h (4 hour infusion).  Monitor labs, c/s, and patient improvement.    Height: 5\' 7"  (170.2 cm) Weight: 62.3 kg (137 lb 5.6 oz) IBW/kg (Calculated) : 66.1  Temp (24hrs), Avg:97.8 F (36.6 C), Min:97.6 F (36.4 C), Max:98.1 F (36.7 C)  Recent Labs  Lab 01/02/20 0909 01/03/20 0313 01/04/20 0517 01/05/20 0532  WBC 10.0 13.3* 16.4* 14.4*  CREATININE 1.17 1.10 1.14 1.01    Estimated Creatinine Clearance: 42 mL/min (by C-G formula based on SCr of 1.01 mg/dL).    Allergies  Allergen Reactions  . Clarithromycin   . Crestor [Rosuvastatin Calcium]   . Lipitor [Atorvastatin Calcium]   . Niaspan [Niacin Er]   . Pneumovax [Pneumococcal Polysaccharide Vaccine]     Arm swelling and rash  . Pravachol   . Ranitidine Hcl   . Simvastatin     Antimicrobials this admission: Zosyn 10/26 >>    Microbiology results: 10/26 Resp PCR: SARS CoV-2 negative; Flu A/B negative   Thank you for allowing pharmacy to be a part of this patient's care.  Despina Pole, Pharm. D. Clinical Pharmacist 01/05/2020 3:17 PM

## 2020-01-05 NOTE — Progress Notes (Signed)
PROGRESS NOTE   Carlos Brooks  UJW:[id number] DOB: [birth year] DOA: 01/02/2020 PCP: Bennie Pierini, FNP   Chief Complaint  Patient presents with   Chest Pain    Brief Admission History:  86 y/o male presents with abdominal pain found to have acalculous cholecystitis and pancreatitis and poor operative candidate.    Assessment & Plan:   Active Problems:   Pancreatitis   Acalculous cholecystitis   Biliary colic symptom   Epigastric pain   Vitamin B12 deficiency  1. Acalculous cholecystitis - Pt is responding favorably to supportive management and IV antibiotics.  Continue for now.  PT evaluation recommended home health PT which patient and family agreeable to and rolling walker.  Plan to discharge home on oral antibiotics.  Outpatient follow up with surgery.     2. Leukocytosis - suspect reactive, no evidence of infection found - follow with current management.  3. Pancreatitis - resolved now.  He is tolerating his diet.  Advance to soft diet as tolerated.  epigastric pain slowly improving.  He presented with markedly elevated lipase and epigastric abd pain.  GI consult requested.  Lipase has normalized and he is tolerating clear liquid diet.  Advance diet per GI and surgery recs.  4. Hypokalemia - repleted.    DVT prophylaxis: enoxaparin Code Status: full  Family Communication: daughter at bedside  Disposition: TBD  Status is: Inpatient  Remains inpatient appropriate because:IV treatments appropriate due to intensity of illness or inability to take PO and Inpatient level of care appropriate due to severity of illness  Dispo: The patient is from: Home              Anticipated d/c is to: TBD              Anticipated d/c date is: 1 days              Patient currently is not medically stable to d/c.  Consultants:   Surgery  GI  Procedures:     Antimicrobials:  Zosyn 10/26>>  Subjective: Pt is tolerating his diet. He wants to get home.        Objective: Vitals:   01/05/20 0858 01/05/20 0910 01/05/20 1318 01/05/20 1516  BP:  (!) 155/58 139/60   Pulse:  (!) 59 69   Resp:  18 18   Temp:  97.8 F (36.6 C) 97.6 F (36.4 C)   TempSrc:  Oral    SpO2: 95% 97% 98% 97%  Weight:      Height:        Intake/Output Summary (Last 24 hours) at 01/05/2020 1608 Last data filed at 01/05/2020 1300 Gross per 24 hour  Intake 4215.6 ml  Output 501 ml  Net 3714.6 ml   Filed Weights   01/02/20 0806 01/02/20 1650  Weight: 63.5 kg 62.3 kg    Examination:  General exam: Elderly male, awake, alert, Appears calm and comfortable, NAD.  Respiratory system:  Respiratory effort normal. Cardiovascular system: normal S1 & S2 heard. Gastrointestinal system: Abdomen is nondistended, soft and persistent mild epigastric tenderness with deep palpation. No guarding. No organomegaly or masses felt.  Normal bowel sounds heard. Central nervous system: Alert and oriented x 2. No focal neurological deficits. Extremities: Symmetric 5 x 5 power.  Skin: No rashes, lesions or ulcers  Psychiatry:  Mood & affect appropriate.   Data Reviewed: I have personally reviewed following labs and imaging studies  CBC: Recent Labs  Lab 01/02/20 0909 01/03/20 0313 01/04/20 0517 01/05/20 0532  WBC 10.0 13.3* 16.4* 14.4*  NEUTROABS  --   --  13.0* 11.4*  HGB 13.8 13.1 12.7* 12.0*  HCT 43.8 41.9 38.7* 36.4*  MCV 96.5 96.5 95.1 92.9  PLT 180 167 164 151    Basic Metabolic Panel: Recent Labs  Lab 01/02/20 0909 01/03/20 0313 01/04/20 0517 01/05/20 0532  NA 137 138 137 138  K 3.7 3.8 3.4* 3.6  CL 102 105 103 103  CO2 27 26 24 25   GLUCOSE 140* 103* 110* 102*  BUN 23 23 25* 24*  CREATININE 1.17 1.10 1.14 1.01  CALCIUM 8.7* 8.1* 8.3* 8.3*  MG  --   --  2.1  --     GFR: Estimated Creatinine Clearance: 42 mL/min (by C-G formula based on SCr of 1.01 mg/dL).  Liver Function Tests: Recent Labs  Lab 01/02/20 0918 01/03/20 0313 01/04/20 0517  01/05/20 0532  AST 38 28 27 22   ALT 21 21 19 17   ALKPHOS 37* 33* 33* 37*  BILITOT 0.8 0.7 1.1 1.3*  PROT 6.5 5.9* 5.8* 5.5*  ALBUMIN 3.8 3.3* 2.9* 2.7*    CBG: No results for input(s): GLUCAP in the last 168 hours.  Recent Results (from the past 240 hour(s))  Respiratory Panel by RT PCR (Flu A&B, Covid) - Nasopharyngeal Swab     Status: None   Collection Time: 01/02/20 11:59 AM   Specimen: Nasopharyngeal Swab  Result Value Ref Range Status   SARS Coronavirus 2 by RT PCR NEGATIVE NEGATIVE Final    Comment: (NOTE) SARS-CoV-2 target nucleic acids are NOT DETECTED.  The SARS-CoV-2 RNA is generally detectable in upper respiratoy specimens during the acute phase of infection. The lowest concentration of SARS-CoV-2 viral copies this assay can detect is 131 copies/mL. A negative result does not preclude SARS-Cov-2 infection and should not be used as the sole basis for treatment or other patient management decisions. A negative result may occur with  improper specimen collection/handling, submission of specimen other than nasopharyngeal swab, presence of viral mutation(s) within the areas targeted by this assay, and inadequate number of viral copies (<131 copies/mL). A negative result must be combined with clinical observations, patient history, and epidemiological information. The expected result is Negative.  Fact Sheet for Patients:  https://www.moore.com/  Fact Sheet for Healthcare Providers:  https://www.young.biz/  This test is no t yet approved or cleared by the [place] FDA and  has been authorized for detection and/or diagnosis of SARS-CoV-2 by FDA under an Emergency Use Authorization (EUA). This EUA will remain  in effect (meaning this test can be used) for the duration of the COVID-19 declaration under Section 564(b)(1) of the Act, 21 U.S.C. section 360bbb-3(b)(1), unless the authorization is terminated or revoked sooner.      Influenza A by PCR NEGATIVE NEGATIVE Final   Influenza B by PCR NEGATIVE NEGATIVE Final    Comment: (NOTE) The Xpert Xpress SARS-CoV-2/FLU/RSV assay is intended as an aid in  the diagnosis of influenza from Nasopharyngeal swab specimens and  should not be used as a sole basis for treatment. Nasal washings and  aspirates are unacceptable for Xpert Xpress SARS-CoV-2/FLU/RSV  testing.  Fact Sheet for Patients: https://www.moore.com/  Fact Sheet for Healthcare Providers: https://www.young.biz/  This test is not yet approved or cleared by the [place] FDA and  has been authorized for detection and/or diagnosis of SARS-CoV-2 by  FDA under an Emergency Use Authorization (EUA). This EUA will remain  in effect (meaning this test can be used) for the duration  of the  Covid-19 declaration under Section 564(b)(1) of the Act, 21  U.S.C. section 360bbb-3(b)(1), unless the authorization is  terminated or revoked. Performed at [facility] [address], [place], [place] [address]    Radiology Studies: No results found. Scheduled Meds:  donepezil  5 mg Oral QHS   enoxaparin (LOVENOX) injection  40 mg Subcutaneous Q24H   finasteride  5 mg Oral Daily   fluticasone  2 spray Each Nare Daily   fluticasone furoate-vilanterol  1 puff Inhalation Daily   ipratropium  0.5 mg Nebulization TID   LORazepam  0.5 mg Oral QHS   memantine  5 mg Oral TID   saccharomyces boulardii  250 mg Oral BID   terazosin  5 mg Oral QHS   Continuous Infusions:  sodium chloride 30 mL/hr at 01/05/20 0856   piperacillin-tazobactam (ZOSYN)  IV 3.375 g (01/05/20 0903)    LOS: 3 days   Time spent: 30 minutes   Tytionna Cloyd Laural Benes, MD How to contact the [facility] Attending or Consulting provider 7A - 7P or covering provider during after hours 7P -7A, for this patient?  1. Check the care team in [facility] and look for a) attending/consulting TRH provider listed and b) the [facility]  team listed 2. Log into [email or web address] and use [place]'s universal password to access. If you do not have the password, please contact the hospital operator. 3. Locate the [facility] provider you are looking for under [facility] and page to a number that you can be directly reached. 4. If you still have difficulty reaching the provider, please page the [facility] (Director on Call) for the Hospitalists listed on amion for assistance.  01/05/2020, 4:08 PM

## 2020-01-05 NOTE — Care Management Important Message (Signed)
Important Message  Patient Details  Name: Carlos Brooks MRN: [id number] Date of Birth: 1926/08/10   Medicare Important Message Given:  Yes - Important Message mailed due to current National Emergency     Tommy Medal 01/05/2020, 5:54 PM

## 2020-01-05 NOTE — Progress Notes (Addendum)
I was present with the medical student for this service. I personally verified the history of present illness, performed the physical exam, and made the plan for this encounter. I have verified the medical student's documentation and made modifications where appropriately. I have personally documented in my own words a brief history, physical, and plan below.     Doing well and pain improved. Tolerating diet. Can switch to oral at discharge.  No plans for surgery can see in a few weeks to check on after hospitalization.  Carlos Labrum, MD [facility] [place], [place] [address] [phone] (office)    [facility] Progress Note     Subjective: Pt doing well this AM w/ no ON events. Pt reports he was able to eat jello w/out abdominal discomfort. Denies pain this AM; reports gas and BM ON. Pt's daughter present and says she thinks pt has seemed more "with it" today and is able to stay awake for longer periods of time.  Objective: Vital signs in last 24 hours: Temp:  [97.7 F (36.5 C)-99.8 F (37.7 C)] 97.8 F (36.6 C) (10/29 0910) Pulse Rate:  [57-65] 59 (10/29 0910) Resp:  [18] 18 (10/29 0910) BP: (150-181)/(51-59) 155/58 (10/29 0910) SpO2:  [90 %-97 %] 97 % (10/29 0910) Last BM Date: 01/02/20  Intake/Output from previous day: 10/28 0701 - 10/29 0700 In: 3725.6 [P.O.:120; I.V.:3291.7; IV Piggyback:313.9] Out: 200 [Urine:200] Intake/Output this shift: Total I/O In: 240 [P.O.:240] Out: -   General appearance: alert, cooperative and no distress Head: Normocephalic, without obvious abnormality, atraumatic Resp: clear to auscultation bilaterally Cardio: regular rate and rhythm, S1, S2 normal, no murmur, click, rub or gallop GI: Soft, TTP RUQ Skin: Skin color, texture, turgor normal. No rashes or lesions  Lab Results:  Recent Labs    01/04/20 0517 01/05/20 0532  WBC 16.4* 14.4*  HGB 12.7* 12.0*  HCT 38.7*  36.4*  PLT 164 151   BMET Recent Labs    01/04/20 0517 01/05/20 0532  NA 137 138  K 3.4* 3.6  CL 103 103  CO2 24 25  GLUCOSE 110* 102*  BUN 25* 24*  CREATININE 1.14 1.01  CALCIUM 8.3* 8.3*   PT/INR Recent Labs    01/03/20 0313  LABPROT 14.1  INR 1.1    Studies/Results: No results found.  Anti-infectives: Anti-infectives (From admission, onward)   Start     Dose/Rate Route Frequency Ordered Stop   01/02/20 1800  piperacillin-tazobactam (ZOSYN) IVPB 3.375 g        3.375 g 12.5 mL/hr over 240 Minutes Intravenous Every 8 hours 01/02/20 1230     01/02/20 1200  piperacillin-tazobactam (ZOSYN) IVPB 3.375 g        3.375 g 100 mL/hr over 30 Minutes Intravenous  Once 01/02/20 1152 01/02/20 1244      Assessment/Plan: Pt is on hospital day 3 for suspected acalculous cholecystitis w/ improved but still present TTP RUQ, reasurring clinical presentation w/ diet tolerated well and BM reported, and leukocytosis still present but stable. Plan as follows:  - Continue IV antibiotics.  - Diet as tolerated.  - No need to rush into cholecystostomy tube as he is clinically stable/ improving slowly.     LOS: 3 days    Raliegh Ip 01/05/2020

## 2020-01-06 MED ORDER — SACCHAROMYCES BOULARDII 250 MG PO CAPS: 250.0000 mg | ORAL_CAPSULE | Freq: Two times a day (BID) | ORAL | 0 refills | Status: AC

## 2020-01-06 MED ORDER — ASPIRIN 81 MG PO TBEC
81.0000 mg | DELAYED_RELEASE_TABLET | Freq: Every day | ORAL | 12 refills | Status: DC
Start: 2020-01-08 — End: 2023-09-15

## 2020-01-06 MED ORDER — AMOXICILLIN-POT CLAVULANATE 500-125 MG PO TABS: 1.0000 | ORAL_TABLET | Freq: Two times a day (BID) | ORAL | 0 refills | Status: AC

## 2020-01-06 MED ORDER — DIPHENOXYLATE-ATROPINE 2.5-0.025 MG PO TABS: 1.0000 | ORAL_TABLET | Freq: Three times a day (TID) | ORAL | 0 refills | Status: AC | PRN

## 2020-01-06 MED ORDER — GUAIFENESIN ER 600 MG PO TB12: 1200.0000 mg | ORAL_TABLET | Freq: Two times a day (BID) | ORAL | Status: DC | PRN

## 2020-01-06 NOTE — Progress Notes (Signed)
Patient decline pnuemoccocal vaccine. Patient states he had a reaction.  AVS reviewed with patient and daughter Katharine Look. All new medications and follow up appointments discussed. Patient and daughter voiced understanding. All questions answered.  IV access removed. Dressing applied. All patient belongings given to daughter.  Daughter will transport patient home.

## 2020-01-06 NOTE — Discharge Summary (Signed)
Physician Discharge Summary  Carlos Brooks OQH:[id number] DOB: 1926/11/06 DOA: 01/02/2020  PCP: [name], FNP Surgery: [name]   Admit date: 01/02/2020 Discharge date: 01/06/2020  Admitted From:  Home  Disposition: Home with [facility]   Recommendations for Outpatient Follow-up:  1. Follow up with PCP in 1 weeks 2. Follow up with GI in 6-8 weeks 3. Follow up with surgery on 11/9  4. Recommend outpatient palliative medicine consultation  Home Health: RN, PT, Rolling Walker  Discharge Condition: STABLE   CODE STATUS: FULL    Brief Hospitalization Summary: Please see all hospital notes, images, labs for full details of the hospitalization. ADMISSION HPI:  86 year old male prior colon polyps external hemorrhoids 12/2001, esophageal stricture status post dilatation 2006--- was recommended by [name] 04/05/2019 barium esophagram not really a good candidate for scoping.  BPH Stable right lung nodules 2007 with underlying small liver lesions History of thyroid nodules Unremarkable stress test 96 and 2001 Prior hernia repairs Admission in 2007 secondary to acute infectious colitis not C. Difficile Memory loss versus dementia followed by [name] last seen 09/18/2019 B12 deficiency being ruled out and given Aricept/Namenda in addition--- also suffers from insomnia and has been given lorazepam to see if this helps by PCP 12/11/2019  Brief Admission History:  86 y/o male presents with abdominal pain found to have acalculous cholecystitis and pancreatitis and poor operative candidate.    Assessment & Plan:   Active Problems:   Pancreatitis   Acalculous cholecystitis   Biliary colic symptom   Epigastric pain   Vitamin B12 deficiency  1. Acalculous cholecystitis - Pt is responding favorably to supportive management and IV antibiotics.  He did not require a cholecystostomy tube.  He is not an operative candidate per surgical team.  PT evaluation  recommended home health PT which patient and family agreeable to and rolling walker.  Plan to discharge home on oral antibiotics augmentin 500 mg BID x 7 days plus probiotics.  Outpatient follow up with surgery on 11/9.  Outpatient follow up with GI in 6-8 weeks.  Follow up with PCP in 1 week.     2. Leukocytosis - suspect reactive, no evidence of infection found - WBC trending down.   3. Pancreatitis - resolved now.  He is tolerating his diet.  Advanced to soft diet.  epigastric pain slowly improving.  He presented with markedly elevated lipase and epigastric abd pain.  GI consult requested.  Lipase has normalized and he is tolerating diet.  4. Hypokalemia - repleted.    DVT prophylaxis: enoxaparin Code Status: full  Family Communication: daughter at bedside  Disposition: Home with Home health   Discharge Diagnoses:  Active Problems:   Pancreatitis   Acalculous cholecystitis   Biliary colic symptom   Epigastric pain   Vitamin B12 deficiency  Discharge Instructions:  Allergies as of 01/06/2020      Reactions   Clarithromycin    Crestor [rosuvastatin Calcium]    Lipitor [atorvastatin Calcium]    Niaspan [niacin Er]    Pneumovax [pneumococcal Polysaccharide Vaccine]    Arm swelling and rash   Pravachol    Ranitidine Hcl    Simvastatin       Medication List    TAKE these medications   amoxicillin-clavulanate 500-125 MG tablet Commonly known as: Augmentin Take 1 tablet (500 mg total) by mouth 2 (two) times daily for 7 days. Start taking on: January 07, 2020   aspirin 81 MG EC tablet Take 1 tablet (81  mg total) by mouth daily. Start taking on: January 08, 2020 What changed: These instructions start on January 08, 2020. If you are unsure what to do until then, ask your doctor or other care provider.   diphenoxylate-atropine 2.5-0.025 MG tablet Commonly known as: Lomotil Take 1 tablet by mouth 3 (three) times daily as needed for up to 3 days for diarrhea or loose stools.    donepezil 5 MG tablet Commonly known as: Aricept Take 1 tablet (5 mg total) by mouth at bedtime.   finasteride 5 MG tablet Commonly known as: PROSCAR Take 1 tablet (5 mg total) by mouth daily.   fluticasone 50 MCG/ACT nasal spray Commonly known as: FLONASE Place 2 sprays into both nostrils daily.   guaiFENesin 600 MG 12 hr tablet Commonly known as: MUCINEX Take 2 tablets (1,200 mg total) by mouth 2 (two) times daily as needed. What changed:   when to take this  reasons to take this   levocetirizine 5 MG tablet Commonly known as: Xyzal Take 0.5 tablets (2.5 mg total) by mouth every evening.   LORazepam 0.5 MG tablet Commonly known as: ATIVAN Take nightly as needed for sleep What changed:   how much to take  how to take this  when to take this  reasons to take this  additional instructions   memantine 5 MG tablet Commonly known as: Namenda Take 1 tablet (5 mg total) by mouth in the morning, at noon, and at bedtime.   pantoprazole 40 MG tablet Commonly known as: PROTONIX Take 1 tablet (40 mg total) by mouth daily.   ProAir HFA 108 (90 Base) MCG/ACT inhaler Generic drug: albuterol 2 PUFFS EVERY 6 HOURS AS NEEDED FOR WHEEZING OR SHORTNESS OF BREATH What changed:   how much to take  how to take this  when to take this  reasons to take this  additional instructions   Repatha SureClick 283 MG/ML Soaj Generic drug: Evolocumab Inject 140 mg into the skin every 14 (fourteen) days.   saccharomyces boulardii 250 MG capsule Commonly known as: FLORASTOR Take 1 capsule (250 mg total) by mouth 2 (two) times daily for 14 days.   terazosin 5 MG capsule Commonly known as: HYTRIN TAKE (1) CAPSULE DAILY What changed:   how much to take  how to take this  when to take this  additional instructions   Trelegy Ellipta 100-62.5-25 MCG/INH Aepb Generic drug: Fluticasone-Umeclidin-Vilant Inhale 1 puff into the lungs daily.            Durable Medical  Equipment  (From admission, onward)         Start     Ordered   01/04/20 1545  For home use only DME Walker rolling  Once       Question Answer Comment  Walker: With 5 Inch Wheels   Patient needs a walker to treat with the following condition Gait instability      01/04/20 1544          Follow-up Information    [facility] Follow up.   Specialty: Home Health Services Why:  PT - will call within 48 hours of discharge. Contact information: [place] [address] [phone]        [facility] Follow up.   Why:  Will ship rolling walker to your home.       Virl Cagey, MD Follow up on 01/16/2020.   Specialty: General Surgery Why: hospital follow up after cholecystitis treated with  antibiotics  Contact information: [address] [facility] [address] [place]. Schedule an appointment as soon as possible for a visit in 6 week(s).   Specialty: Gastroenterology Why: Hospital Follow Up  Contact information: [address] [place] [phone]       [name], [place]. Schedule an appointment as soon as possible for a visit in 1 week(s).   Specialty: Family Medicine Contact information: [address] [place] [address] [phone]              Allergies  Allergen Reactions  . Clarithromycin   . Crestor [Rosuvastatin Calcium]   . Lipitor [Atorvastatin Calcium]   . Niaspan [Niacin Er]   . Pneumovax [Pneumococcal Polysaccharide Vaccine]     Arm swelling and rash  . Pravachol   . Ranitidine Hcl   . Simvastatin    Allergies as of 01/06/2020      Reactions   Clarithromycin    Crestor [rosuvastatin Calcium]    Lipitor [atorvastatin Calcium]    Niaspan [niacin Er]    Pneumovax [pneumococcal Polysaccharide Vaccine]    Arm swelling and rash   Pravachol    Ranitidine Hcl    Simvastatin        Medication List    TAKE these medications   amoxicillin-clavulanate 500-125 MG tablet Commonly known as: Augmentin Take 1 tablet (500 mg total) by mouth 2 (two) times daily for 7 days. Start taking on: January 07, 2020   aspirin 81 MG EC tablet Take 1 tablet (81 mg total) by mouth daily. Start taking on: January 08, 2020 What changed: These instructions start on January 08, 2020. If you are unsure what to do until then, ask your doctor or other care provider.   diphenoxylate-atropine 2.5-0.025 MG tablet Commonly known as: Lomotil Take 1 tablet by mouth 3 (three) times daily as needed for up to 3 days for diarrhea or loose stools.   donepezil 5 MG tablet Commonly known as: Aricept Take 1 tablet (5 mg total) by mouth at bedtime.   finasteride 5 MG tablet Commonly known as: PROSCAR Take 1 tablet (5 mg total) by mouth daily.   fluticasone 50 MCG/ACT nasal spray Commonly known as: FLONASE Place 2 sprays into both nostrils daily.   guaiFENesin 600 MG 12 hr tablet Commonly known as: MUCINEX Take 2 tablets (1,200 mg total) by mouth 2 (two) times daily as needed. What changed:   when to take this  reasons to take this   levocetirizine 5 MG tablet Commonly known as: Xyzal Take 0.5 tablets (2.5 mg total) by mouth every evening.   LORazepam 0.5 MG tablet Commonly known as: ATIVAN Take nightly as needed for sleep What changed:   how much to take  how to take this  when to take this  reasons to take this  additional instructions   memantine 5 MG tablet Commonly known as: Namenda Take 1 tablet (5 mg total) by mouth in the morning, at noon, and at bedtime.   pantoprazole 40 MG tablet Commonly known as: PROTONIX Take 1 tablet (40 mg total) by mouth daily.   ProAir HFA 108 (90 Base) MCG/ACT inhaler Generic drug: albuterol 2 PUFFS EVERY 6 HOURS AS NEEDED FOR WHEEZING OR SHORTNESS OF BREATH What changed:   how much to take  how to take this  when to take  this  reasons to take this  additional instructions  Repatha SureClick 789 MG/ML Soaj Generic drug: Evolocumab Inject 140 mg into the skin every 14 (fourteen) days.   saccharomyces boulardii 250 MG capsule Commonly known as: FLORASTOR Take 1 capsule (250 mg total) by mouth 2 (two) times daily for 14 days.   terazosin 5 MG capsule Commonly known as: HYTRIN TAKE (1) CAPSULE DAILY What changed:   how much to take  how to take this  when to take this  additional instructions   Trelegy Ellipta 100-62.5-25 MCG/INH Aepb Generic drug: Fluticasone-Umeclidin-Vilant Inhale 1 puff into the lungs daily.            Durable Medical Equipment  (From admission, onward)         Start     Ordered   01/04/20 1545  For home use only DME Walker rolling  Once       Question Answer Comment  Walker: With 5 Inch Wheels   Patient needs a walker to treat with the following condition Gait instability      01/04/20 1544          Procedures/Studies: MR Abdomen W or Wo Contrast  Result Date: 01/02/2020 CLINICAL DATA:  Right upper quadrant pain 2 weeks. CT and examinations. EXAM: MRI ABDOMEN WITHOUT AND WITH CONTRAST (INCLUDING MRCP) TECHNIQUE: Multiplanar multisequence MR imaging of the abdomen was performed both before and after the administration of intravenous contrast. Heavily T2-weighted images of the biliary and pancreatic ducts were obtained, and three-dimensional MRCP images were rendered by post processing. CONTRAST:  23mL GADAVIST GADOBUTROL 1 MMOL/ML IV SOLN COMPARISON:  CT and ultrasound examinations 01/02/1999 FINDINGS: Examination is quite limited due to respiratory motion. The postcontrast images and particularly are limited. Lower chest: The lung bases are grossly6 clear. No pulmonary lesions or pleural effusions. The heart is limits of normal in size but no pericardial effusion. Hepatobiliary: No hepatic lesions are identified. No intrahepatic biliary dilatation. Small  amount of perihepatic fluid is noted. The gallbladder is moderately distended and demonstrates wall thickening, pericholecystic inflammatory changes and fluid. This also mild inflammatory changes involving the adjacent right hepatic lobe with a small interposed fluid collection. No gallstones are identified. Normal caliber and course of the common bile duct. No common bile duct stones are identified. Pancreas: Age related pancreatic atrophy is noted. There are 2 small cystic lesions in the pancreas there is a 3 mm cyst in the head body junction region and a 10 mm cyst in the pancreatic tail. These are likely benign postinflammatory cyst or possibly side branch IPMNs. Given the patient's age I do not think any further imaging evaluation or follow-up is necessary. Normal caliber and course of the main pancreatic duct. No findings for pancreatitis. Spleen:  Normal size.  No focal lesions. Adrenals/Urinary Tract: The adrenal glands and kidneys are unremarkable. Stomach/Bowel: Small hiatal hernia noted. The stomach is otherwise unremarkable. The duodenum, visualized small bowel and visualized colon are unremarkable. There is some fluid/inflammation surrounding the second portion of the duodenum likely due to the gallbladder process. Vascular/Lymphatic: The aorta is normal in caliber. No dissection. The branch vessels are patent. The major venous structures are patent. No mesenteric or retroperitoneal mass or adenopathy. Other:  No ascites or abdominal wall hernia. Musculoskeletal: No significant bony findings. IMPRESSION: 1. Moderately distended gallbladder with wall thickening, pericholecystic inflammatory changes and fluid. This is most likely due to acute acalculous cholecystitis. No common bile duct stones. Nuclear medicine hepatobiliary scan may be helpful for further evaluation. 2. Normal pancreaticobiliary tree. 3. Small pancreatic  cystic lesions measuring up to 10 mm. These are likely benign postinflammatory cyst  or possibly side branch IPMNs. Given the patient's age I do not think any further imaging evaluation or follow-up is necessary. 4. Small hiatal hernia. Electronically Signed   By: Marijo Sanes M.D.   On: 01/02/2020 14:39   MR 3D Recon At Scanner  Result Date: 01/02/2020 CLINICAL DATA:  Right upper quadrant pain 2 weeks. CT and examinations. EXAM: MRI ABDOMEN WITHOUT AND WITH CONTRAST (INCLUDING MRCP) TECHNIQUE: Multiplanar multisequence MR imaging of the abdomen was performed both before and after the administration of intravenous contrast. Heavily T2-weighted images of the biliary and pancreatic ducts were obtained, and three-dimensional MRCP images were rendered by post processing. CONTRAST:  63mL GADAVIST GADOBUTROL 1 MMOL/ML IV SOLN COMPARISON:  CT and ultrasound examinations 01/02/1999 FINDINGS: Examination is quite limited due to respiratory motion. The postcontrast images and particularly are limited. Lower chest: The lung bases are grossly6 clear. No pulmonary lesions or pleural effusions. The heart is limits of normal in size but no pericardial effusion. Hepatobiliary: No hepatic lesions are identified. No intrahepatic biliary dilatation. Small amount of perihepatic fluid is noted. The gallbladder is moderately distended and demonstrates wall thickening, pericholecystic inflammatory changes and fluid. This also mild inflammatory changes involving the adjacent right hepatic lobe with a small interposed fluid collection. No gallstones are identified. Normal caliber and course of the common bile duct. No common bile duct stones are identified. Pancreas: Age related pancreatic atrophy is noted. There are 2 small cystic lesions in the pancreas there is a 3 mm cyst in the head body junction region and a 10 mm cyst in the pancreatic tail. These are likely benign postinflammatory cyst or possibly side branch IPMNs. Given the patient's age I do not think any further imaging evaluation or follow-up is necessary.  Normal caliber and course of the main pancreatic duct. No findings for pancreatitis. Spleen:  Normal size.  No focal lesions. Adrenals/Urinary Tract: The adrenal glands and kidneys are unremarkable. Stomach/Bowel: Small hiatal hernia noted. The stomach is otherwise unremarkable. The duodenum, visualized small bowel and visualized colon are unremarkable. There is some fluid/inflammation surrounding the second portion of the duodenum likely due to the gallbladder process. Vascular/Lymphatic: The aorta is normal in caliber. No dissection. The branch vessels are patent. The major venous structures are patent. No mesenteric or retroperitoneal mass or adenopathy. Other:  No ascites or abdominal wall hernia. Musculoskeletal: No significant bony findings. IMPRESSION: 1. Moderately distended gallbladder with wall thickening, pericholecystic inflammatory changes and fluid. This is most likely due to acute acalculous cholecystitis. No common bile duct stones. Nuclear medicine hepatobiliary scan may be helpful for further evaluation. 2. Normal pancreaticobiliary tree. 3. Small pancreatic cystic lesions measuring up to 10 mm. These are likely benign postinflammatory cyst or possibly side branch IPMNs. Given the patient's age I do not think any further imaging evaluation or follow-up is necessary. 4. Small hiatal hernia. Electronically Signed   By: Marijo Sanes M.D.   On: 01/02/2020 14:39   CT Angio Chest/Abd/Pel for Dissection W and/or Wo Contrast  Result Date: 01/02/2020 CLINICAL DATA:  Chest and abdominal pain EXAM: CT ANGIOGRAPHY CHEST, ABDOMEN AND PELVIS TECHNIQUE: Non-contrast CT of the chest was initially obtained. Multidetector CT imaging through the chest, abdomen and pelvis was performed using the standard protocol during bolus administration of intravenous contrast. Multiplanar reconstructed images and MIPs were obtained and reviewed to evaluate the vascular anatomy. CONTRAST:  180mL OMNIPAQUE IOHEXOL 350 MG/ML  SOLN  COMPARISON:  Chest CT April 25, 2009 FINDINGS: CTA CHEST FINDINGS Cardiovascular: There is no intramural hematoma on noncontrast enhanced study. There is no evident mediastinal hematoma. There is no thoracic aortic aneurysm or dissection. There are scattered foci of calcification in visualized great vessels. There is aortic atherosclerosis as well as scattered foci of coronary artery calcification. There is no appreciable pericardial effusion or pericardial thickening. No demonstrable pulmonary embolus. Mediastinum/Nodes: Thyroid appears normal. There is no appreciable thoracic adenopathy. There is a focal hiatal hernia. Lungs/Pleura: There is scarring in each lung apex. More severe on the right than on the left. A degree of superimposed infiltrate in the apical segment of the right upper lobe cannot be excluded. There are scattered areas of scarring and bullous disease in the upper lobe regions. There is patchy bibasilar atelectatic change. No appreciable pleural effusions. There is mild bronchiectatic change in the right middle lobe. Musculoskeletal: No blastic or lytic bone lesions. No appreciable fracture or dislocation. No chest wall lesions. Review of the MIP images confirms the above findings. CTA ABDOMEN AND PELVIS FINDINGS VASCULAR Aorta: No evident abdominal aortic aneurysm or dissection. There is atherosclerotic calcification throughout the course of the aorta without hemodynamically significant obstruction. Celiac: The celiac artery and its branches show no evident dissection or aneurysm. There is moderate plaque at the origin of the celiac artery. Other major branches of the celiac artery appear widely patent. SMA: Superior mesenteric artery and its branches are patent with slight atherosclerotic plaque at the origin of the superior mesenteric artery. Major superior mesenteric artery branches elsewhere are patent. No aneurysm or dissection involving the superior mesenteric artery or its  branches. Renals: There is a single renal artery on each side. There is atherosclerotic calcification at the origin the right renal artery causing approximately 70% diameter stenosis. There is 75-80% diameter stenosis at the origin of the left renal artery. No aneurysm or dissection involving the renal arteries and their branches. No fibromuscular dysplasia evident. IMA: Inferior mesenteric artery is patent although there is moderate atherosclerotic plaque at the origin of this vessel. More distally, branches of the inferior mesenteric artery are patent. No aneurysm or dissection involving these branches. Inflow: There is atherosclerotic plaque in both common iliac arteries with several areas of 40-50% diameter stenosis in these arteries. There are foci of atherosclerotic plaque in both internal iliac and external iliac arteries with moderate least severe narrowing at the origin of the right and left hypogastric arteries but less than 50% diameter stenosis in the external iliac arteries. There is mild atherosclerotic plaque in each common femoral artery with less than 50% diameter stenosis. There is atherosclerotic plaque in each proximal superficial femoral artery with areas of 50-60% diameter stenosis. No aneurysm or dissection involving these vessels. Veins: No obvious venous abnormality within the limitations of this arterial phase study. Review of the MIP images confirms the above findings. NON-VASCULAR Hepatobiliary: Fatty infiltration is noted in the liver. There is a probable cyst near the dome of the liver on the right measuring 6 mm. Note that the wall of the gallbladder appears mildly thickened and equivocally edematous. No appreciable biliary duct dilatation evident. Pancreas: There is no pancreatic mass or inflammatory focus. Spleen: No splenic lesions are evident. Adrenals/Urinary Tract: Adrenals bilaterally appear normal. There is no appreciable renal mass or hydronephrosis on either side. No  appreciable renal or ureteral calculus evident on either side. Note that contrast within the collecting systems and ureters may mask smaller calculi. Urinary bladder wall thickness is  within normal limits. Stomach/Bowel: There are sigmoid diverticula without evident diverticulitis. There is no appreciable bowel wall or mesenteric thickening. No evident bowel obstruction. The terminal ileum appears normal. There is no appreciable free air or portal venous air. Lymphatic: There is no evident adenopathy in the abdomen or pelvis. Reproductive: Prostate and seminal vesicles are normal in size and configuration. Other: Appendix appears normal. No appreciable abscess or ascites in the abdomen or pelvis. Musculoskeletal: There is degenerative change in the thoracic spine. There is 4 mm of anterolisthesis of L3 on L4, felt to be due to underlying spondylosis. No evident blastic or lytic bone lesions. No intramuscular or abdominal wall lesions are evident. Review of the MIP images confirms the above findings. IMPRESSION: CT angiogram chest: 1. No demonstrable thoracic aortic aneurysm or dissection. No mediastinal hematoma or intramural lesion. There is aortic atherosclerosis as well as foci of great vessel and coronary artery calcification. 2. Apical scarring bilaterally. A degree of superimposed infiltrate in the apical segment right upper lobe cannot be excluded. Scattered areas of atelectatic change. Bullous disease in the apices noted. Mild bronchiectasis right middle lobe. 3. Hiatal hernia present. 4. No demonstrable thoracic adenopathy. CT angiogram abdomen; CT angiogram pelvis: 1. Atherosclerotic calcification throughout the aorta as well as at multiple sites in mesenteric and pelvic arterial vessels. Atherosclerotic plaque is most notable in the proximal renal arteries as well as in the common and internal iliac arteries bilaterally. No aneurysm or dissection evident in the aorta, major mesenteric, and major pelvic  arterial vessels. No evident fibromuscular dysplasia. 2. Gallbladder wall appears mildly thickened with questionable gallbladder wall edema. This appearance potentially could indicate a degree of acalculus cholecystitis. Ultrasound evaluation of gallbladder advised in this regard. 3.  Fatty infiltration in liver. 4. Sigmoid diverticula without diverticulitis. No bowel wall thickening or bowel obstruction. No abscess in the abdomen or pelvis. Appendix appears normal. 5. No hydronephrosis on either side. No renal or ureteral calculi evident. Note that small calculi could be masked by intravenous contrast. No urinary bladder wall thickening. Aortic Atherosclerosis (ICD10-I70.0). Electronically Signed   By: Lowella Grip III M.D.   On: 01/02/2020 09:21   US Abdomen Limited RUQ (LIVER/GB)  Result Date: 01/02/2020 CLINICAL DATA:  Biliary colic, abnormal CT results. EXAM: ULTRASOUND ABDOMEN LIMITED RIGHT UPPER QUADRANT COMPARISON:  01/02/2020 CTA chest, abdomen and pelvis. 03/10/2005 CT abdomen report. FINDINGS: Gallbladder: No gallstones visualized. The gallbladder wall is diffusely thickened and edematous measuring 7.6 mm. Pericholecystic free fluid is present. No sonographic Murphy sign noted by sonographer. Common bile duct: Diameter: 3.5 mm Liver: Ill-defined hepatic segment 5 hypervascular region along the gallbladder fossa (ultrasound image 16/65). Mildly increased parenchymal echogenicity. Trace perihepatic free fluid is present. Portal vein is patent on color Doppler imaging with normal direction of blood flow towards the liver. Other: Please note that limited patient mobility limits evaluation. IMPRESSION: Ill-defined hepatic segment 5 hypervascular region correlating with heterogenous enhancement seen on prior same day CT (CT 7:100). While this may reflect reactive parenchymal inflammation, MRI abdomen with and without contrast is recommended to exclude neoplasm. Diffuse gallbladder wall inflammation  with small volume pericholecystic free fluid. No gallstones or biliary dilatation. Electronically Signed   By: Primitivo Gauze M.D.   On: 01/02/2020 11:30      Subjective: Pt is awake and has been tolerating his diet with no problems.  He really wants to get home.  Having some loose stools with antibiotics.   Discharge Exam: Vitals:   01/06/20 0507 01/06/20  0730  BP: (!) 138/55   Pulse: 70   Resp:    Temp: 98.6 F (37 C)   SpO2: 96% 95%   Vitals:   01/05/20 2001 01/05/20 2142 01/06/20 0507 01/06/20 0730  BP:  139/60 (!) 138/55   Pulse:  63 70   Resp:  20    Temp:  98.6 F (37 C) 98.6 F (37 C)   TempSrc:  Oral Oral   SpO2: 93% 95% 96% 95%  Weight:      Height:       General: Pt is alert, awake, not in acute distress Cardiovascular: normal S1/S2 +, no rubs, no gallops Respiratory: CTA bilaterally, no wheezing, no rhonchi Abdominal: Soft, he still has some mild epigastric tenderness but no guarding, ND, bowel sounds + Extremities: no edema, no cyanosis   The results of significant diagnostics from this hospitalization (including imaging, microbiology, ancillary and laboratory) are listed below for reference.    Microbiology: Recent Results (from the past 240 hour(s))  Respiratory Panel by RT PCR (Flu A&B, Covid) - Nasopharyngeal Swab     Status: None   Collection Time: 01/02/20 11:59 AM   Specimen: Nasopharyngeal Swab  Result Value Ref Range Status   SARS Coronavirus 2 by RT PCR NEGATIVE NEGATIVE Final    Comment: (NOTE) SARS-CoV-2 target nucleic acids are NOT DETECTED.  The SARS-CoV-2 RNA is generally detectable in upper respiratoy specimens during the acute phase of infection. The lowest concentration of SARS-CoV-2 viral copies this assay can detect is 131 copies/mL. A negative result does not preclude SARS-Cov-2 infection and should not be used as the sole basis for treatment or other patient management decisions. A negative result may occur with  improper  specimen collection/handling, submission of specimen other than nasopharyngeal swab, presence of viral mutation(s) within the areas targeted by this assay, and inadequate number of viral copies (<131 copies/mL). A negative result must be combined with clinical observations, patient history, and epidemiological information. The expected result is Negative.  Fact Sheet for Patients:  PinkCheek.be  Fact Sheet for Healthcare Providers:  GravelBags.it  This test is no t yet approved or cleared by the [place] FDA and  has been authorized for detection and/or diagnosis of SARS-CoV-2 by FDA under an Emergency Use Authorization (EUA). This EUA will remain  in effect (meaning this test can be used) for the duration of the COVID-19 declaration under Section 564(b)(1) of the Act, 21 U.S.C. section 360bbb-3(b)(1), unless the authorization is terminated or revoked sooner.     Influenza A by PCR NEGATIVE NEGATIVE Final   Influenza B by PCR NEGATIVE NEGATIVE Final    Comment: (NOTE) The Xpert Xpress SARS-CoV-2/FLU/RSV assay is intended as an aid in  the diagnosis of influenza from Nasopharyngeal swab specimens and  should not be used as a sole basis for treatment. Nasal washings and  aspirates are unacceptable for Xpert Xpress SARS-CoV-2/FLU/RSV  testing.  Fact Sheet for Patients: PinkCheek.be  Fact Sheet for Healthcare Providers: GravelBags.it  This test is not yet approved or cleared by the [place] FDA and  has been authorized for detection and/or diagnosis of SARS-CoV-2 by  FDA under an Emergency Use Authorization (EUA). This EUA will remain  in effect (meaning this test can be used) for the duration of the  Covid-19 declaration under Section 564(b)(1) of the Act, 21  U.S.C. section 360bbb-3(b)(1), unless the authorization is  terminated or revoked. Performed at  [facility], [address], [place], [place] [address]  Labs: BNP (last 3 results) No results for input(s): BNP in the last 8760 hours. Basic Metabolic Panel: Recent Labs  Lab 01/02/20 0909 01/03/20 0313 01/04/20 0517 01/05/20 0532  NA 137 138 137 138  K 3.7 3.8 3.4* 3.6  CL 102 105 103 103  CO2 27 26 24 25   GLUCOSE 140* 103* 110* 102*  BUN 23 23 25* 24*  CREATININE 1.17 1.10 1.14 1.01  CALCIUM 8.7* 8.1* 8.3* 8.3*  MG  --   --  2.1  --    Liver Function Tests: Recent Labs  Lab 01/02/20 0918 01/03/20 0313 01/04/20 0517 01/05/20 0532  AST 38 28 27 22   ALT 21 21 19 17   ALKPHOS 37* 33* 33* 37*  BILITOT 0.8 0.7 1.1 1.3*  PROT 6.5 5.9* 5.8* 5.5*  ALBUMIN 3.8 3.3* 2.9* 2.7*   Recent Labs  Lab 01/02/20 0918 01/03/20 1210 01/04/20 0517 01/05/20 0532  LIPASE 1,755* 45 23 20   No results for input(s): AMMONIA in the last 168 hours. CBC: Recent Labs  Lab 01/02/20 0909 01/03/20 0313 01/04/20 0517 01/05/20 0532  WBC 10.0 13.3* 16.4* 14.4*  NEUTROABS  --   --  13.0* 11.4*  HGB 13.8 13.1 12.7* 12.0*  HCT 43.8 41.9 38.7* 36.4*  MCV 96.5 96.5 95.1 92.9  PLT 180 167 164 151   Cardiac Enzymes: No results for input(s): CKTOTAL, CKMB, CKMBINDEX, TROPONINI in the last 168 hours. BNP: Invalid input(s): POCBNP CBG: No results for input(s): GLUCAP in the last 168 hours. D-Dimer No results for input(s): DDIMER in the last 72 hours. Hgb A1c No results for input(s): HGBA1C in the last 72 hours. Lipid Profile No results for input(s): CHOL, HDL, LDLCALC, TRIG, CHOLHDL, LDLDIRECT in the last 72 hours. Thyroid function studies No results for input(s): TSH, T4TOTAL, T3FREE, THYROIDAB in the last 72 hours.  Invalid input(s): FREET3 Anemia work up No results for input(s): VITAMINB12, FOLATE, FERRITIN, TIBC, IRON, RETICCTPCT in the last 72 hours. Urinalysis    Component Value Date/Time   APPEARANCEUR Clear 05/12/2017 1017   GLUCOSEU Negative 05/12/2017 1017    BILIRUBINUR Negative 05/12/2017 1017   PROTEINUR Negative 05/12/2017 1017   UROBILINOGEN negative 01/15/2015 1042   NITRITE Negative 05/12/2017 1017   LEUKOCYTESUR Negative 05/12/2017 1017   Sepsis Labs Invalid input(s): PROCALCITONIN,  WBC,  LACTICIDVEN Microbiology Recent Results (from the past 240 hour(s))  Respiratory Panel by RT PCR (Flu A&B, Covid) - Nasopharyngeal Swab     Status: None   Collection Time: 01/02/20 11:59 AM   Specimen: Nasopharyngeal Swab  Result Value Ref Range Status   SARS Coronavirus 2 by RT PCR NEGATIVE NEGATIVE Final    Comment: (NOTE) SARS-CoV-2 target nucleic acids are NOT DETECTED.  The SARS-CoV-2 RNA is generally detectable in upper respiratoy specimens during the acute phase of infection. The lowest concentration of SARS-CoV-2 viral copies this assay can detect is 131 copies/mL. A negative result does not preclude SARS-Cov-2 infection and should not be used as the sole basis for treatment or other patient management decisions. A negative result may occur with  improper specimen collection/handling, submission of specimen other than nasopharyngeal swab, presence of viral mutation(s) within the areas targeted by this assay, and inadequate number of viral copies (<131 copies/mL). A negative result must be combined with clinical observations, patient history, and epidemiological information. The expected result is Negative.  Fact Sheet for Patients:  PinkCheek.be  Fact Sheet for Healthcare Providers:  GravelBags.it  This test is no t yet approved or  cleared by the [place] and  has been authorized for detection and/or diagnosis of SARS-CoV-2 by FDA under an Emergency Use Authorization (EUA). This EUA will remain  in effect (meaning this test can be used) for the duration of the COVID-19 declaration under Section 564(b)(1) of the Act, 21 U.S.C. section 360bbb-3(b)(1), unless the  authorization is terminated or revoked sooner.     Influenza A by PCR NEGATIVE NEGATIVE Final   Influenza B by PCR NEGATIVE NEGATIVE Final    Comment: (NOTE) The Xpert Xpress SARS-CoV-2/FLU/RSV assay is intended as an aid in  the diagnosis of influenza from Nasopharyngeal swab specimens and  should not be used as a sole basis for treatment. Nasal washings and  aspirates are unacceptable for Xpert Xpress SARS-CoV-2/FLU/RSV  testing.  Fact Sheet for Patients: PinkCheek.be  Fact Sheet for Healthcare Providers: GravelBags.it  This test is not yet approved or cleared by the [place] FDA and  has been authorized for detection and/or diagnosis of SARS-CoV-2 by  FDA under an Emergency Use Authorization (EUA). This EUA will remain  in effect (meaning this test can be used) for the duration of the  Covid-19 declaration under Section 564(b)(1) of the Act, 21  U.S.C. section 360bbb-3(b)(1), unless the authorization is  terminated or revoked. Performed at [facility], [address], [place], [place] [address]    Time coordinating discharge: 38 mins   SIGNED:  Irwin Brakeman, MD  [facility] 01/06/2020, 9:01 AM How to contact the [facility] Attending or Consulting provider [place] or covering provider during after hours [place], for this patient?  1. Check the care team in [facility] and look for a) attending/consulting [facility] provider listed and b) the [facility] team listed 2. Log into [email or web address] and use [place]'s universal password to access. If you do not have the password, please contact the hospital operator. 3. Locate the [facility] provider you are looking for under [facility] and page to a number that you can be directly reached. 4. If you still have difficulty reaching the provider, please page the [facility] (Director on Call) for the Hospitalists listed on amion for assistance.

## 2020-01-06 NOTE — Discharge Instructions (Signed)
IMPORTANT INFORMATION: PAY CLOSE ATTENTION   PHYSICIAN DISCHARGE INSTRUCTIONS  Follow with Primary care provider  Martin, Mary-Margaret, FNP  and other consultants as instructed by your Hospitalist Physician  SEEK MEDICAL CARE OR RETURN TO EMERGENCY ROOM IF SYMPTOMS COME BACK, WORSEN OR NEW PROBLEM DEVELOPS   Please note: You were cared for by a hospitalist during your hospital stay. Every effort will be made to forward records to your primary care provider.  You can request that your primary care provider send for your hospital records if they have not received them.  Once you are discharged, your primary care physician will handle any further medical issues. Please note that NO REFILLS for any discharge medications will be authorized once you are discharged, as it is imperative that you return to your primary care physician (or establish a relationship with a primary care physician if you do not have one) for your post hospital discharge needs so that they can reassess your need for medications and monitor your lab values.  Please get a complete blood count and chemistry panel checked by your Primary MD at your next visit, and again as instructed by your Primary MD.  Get Medicines reviewed and adjusted: Please take all your medications with you for your next visit with your Primary MD  Laboratory/radiological data: Please request your Primary MD to go over all hospital tests and procedure/radiological results at the follow up, please ask your primary care provider to get all Hospital records sent to his/her office.  In some cases, they will be blood work, cultures and biopsy results pending at the time of your discharge. Please request that your primary care provider follow up on these results.  If you are diabetic, please bring your blood sugar readings with you to your follow up appointment with primary care.    Please call and make your follow up appointments as soon as possible.     Also Note the following: If you experience worsening of your admission symptoms, develop shortness of breath, life threatening emergency, suicidal or homicidal thoughts you must seek medical attention immediately by calling 911 or calling your MD immediately  if symptoms less severe.  You must read complete instructions/literature along with all the possible adverse reactions/side effects for all the Medicines you take and that have been prescribed to you. Take any new Medicines after you have completely understood and accpet all the possible adverse reactions/side effects.   Do not drive when taking Pain medications or sleeping medications (Benzodiazepines)  Do not take more than prescribed Pain, Sleep and Anxiety Medications. It is not advisable to combine anxiety,sleep and pain medications without talking with your primary care practitioner  Special Instructions: If you have smoked or chewed Tobacco  in the last 2 yrs please stop smoking, stop any regular Alcohol  and or any Recreational drug use.  Wear Seat belts while driving.  Do not drive if taking any narcotic, mind altering or controlled substances or recreational drugs or alcohol.        

## 2020-01-08 ENCOUNTER — Telehealth: Payer: Self-pay

## 2020-01-08 NOTE — Telephone Encounter (Signed)
Contact Date: 01/08/2020 Contacted By: Nigel Berthold  Transition Care Management Follow-up Telephone Call  Date of discharge and from where: 01/06/20- [facility]   How have you been since you were released from the hospital? Patient has improved and gotten stronger  Any questions or concerns? No   Items Reviewed:  Did the pt receive and understand the discharge instructions provided? Yes   Medications obtained and verified? Yes   Any new allergies since your discharge? No   Dietary orders reviewed? No  Do you have support at home? Yes   Discontinued Medications None New Medications Added amoxicillin-clavulanate 500-125 MG tablet Commonly known as: Augmentin Take 1 tablet (500 mg total) by mouth 2 (two) times daily for 7 days. Start taking on: January 07, 2020 diphenoxylate-atropine 2.5-0.025 MG tablet Commonly known as: Lomotil Take 1 tablet by mouth 3 (three) times daily as needed for up to 3 days for diarrhea or loose stools. guaiFENesin 600 MG 12 hr tablet Commonly known as: MUCINEX Take 2 tablets (1,200 mg total) by mouth 2 (two) times daily as needed. What changed:   when to take this reasons to take this saccharomyces boulardii 250 MG capsule Commonly known as: FLORASTOR Take 1 capsule (250 mg total) by mouth 2 (two) times daily for 14 days. Current Medication List Allergies as of 01/08/2020      Reactions   Clarithromycin    Crestor [rosuvastatin Calcium]    Lipitor [atorvastatin Calcium]    Niaspan [niacin Er]    Pneumovax [pneumococcal Polysaccharide Vaccine]    Arm swelling and rash   Pravachol    Ranitidine Hcl    Simvastatin       Medication List       Accurate as of January 08, 2020 12:11 PM. If you have any questions, ask your nurse or doctor.        amoxicillin-clavulanate 500-125 MG tablet Commonly known as: Augmentin Take 1 tablet (500 mg total) by mouth 2 (two) times daily for 7 days.   aspirin 81 MG EC tablet Take  1 tablet (81 mg total) by mouth daily.   diphenoxylate-atropine 2.5-0.025 MG tablet Commonly known as: Lomotil Take 1 tablet by mouth 3 (three) times daily as needed for up to 3 days for diarrhea or loose stools.   donepezil 5 MG tablet Commonly known as: Aricept Take 1 tablet (5 mg total) by mouth at bedtime.   finasteride 5 MG tablet Commonly known as: PROSCAR Take 1 tablet (5 mg total) by mouth daily.   fluticasone 50 MCG/ACT nasal spray Commonly known as: FLONASE Place 2 sprays into both nostrils daily.   guaiFENesin 600 MG 12 hr tablet Commonly known as: MUCINEX Take 2 tablets (1,200 mg total) by mouth 2 (two) times daily as needed.   levocetirizine 5 MG tablet Commonly known as: Xyzal Take 0.5 tablets (2.5 mg total) by mouth every evening.   LORazepam 0.5 MG tablet Commonly known as: ATIVAN Take nightly as needed for sleep What changed:   how much to take  how to take this  when to take this  reasons to take this  additional instructions   memantine 5 MG tablet Commonly known as: Namenda Take 1 tablet (5 mg total) by mouth in the morning, at noon, and at bedtime.   pantoprazole 40 MG tablet Commonly known as: PROTONIX Take 1 tablet (40 mg total) by mouth daily.   ProAir HFA 108 (90 Base) MCG/ACT inhaler Generic drug: albuterol 2 PUFFS EVERY 6 HOURS AS  NEEDED FOR WHEEZING OR SHORTNESS OF BREATH What changed:   how much to take  how to take this  when to take this  reasons to take this  additional instructions   Repatha SureClick 601 MG/ML Soaj Generic drug: Evolocumab Inject 140 mg into the skin every 14 (fourteen) days.   saccharomyces boulardii 250 MG capsule Commonly known as: FLORASTOR Take 1 capsule (250 mg total) by mouth 2 (two) times daily for 14 days.   terazosin 5 MG capsule Commonly known as: HYTRIN TAKE (1) CAPSULE DAILY What changed:   how much to take  how to take this  when to take this  additional instructions     Trelegy Ellipta 100-62.5-25 MCG/INH Aepb Generic drug: Fluticasone-Umeclidin-Vilant Inhale 1 puff into the lungs daily.        Home Care and Equipment/Supplies: Were home health services ordered? yes If so, what is the name of the agency? [facility] Has the agency set up a time to come to the patient's home? no Were any new equipment or medical supplies ordered?  Yes- DME rolling walker What is the name of the medical supply agency? [facility] Were you able to get the supplies/equipment? yes Do you have any questions related to the use of the equipment or supplies? No  Functional Questionnaire: (I = Independent and D = Dependent) ADLs: D  Bathing/Dressing- D  Meal Prep- D  Eating- I   Maintaining continence- I  Transferring/Ambulation- I  Managing Meds- D  Follow up appointments reviewed:   PCP Hospital f/u appt confirmed? Yes  Scheduled to see MMM on 01/11/20 @ [place] f/u appt confirmed? No    Are transportation arrangements needed? No   If their condition worsens, is the pt aware to call PCP or go to the Emergency Dept.? Yes  Was the patient provided with contact information for the PCP's office or ED? Yes  Was to pt encouraged to call back with questions or concerns? Yes

## 2020-01-10 ENCOUNTER — Ambulatory Visit: Payer: Self-pay

## 2020-01-11 ENCOUNTER — Other Ambulatory Visit: Payer: Self-pay

## 2020-01-11 ENCOUNTER — Encounter: Payer: Self-pay | Admitting: Nurse Practitioner

## 2020-01-11 ENCOUNTER — Ambulatory Visit (INDEPENDENT_AMBULATORY_CARE_PROVIDER_SITE_OTHER): Payer: PPO | Admitting: Nurse Practitioner

## 2020-01-11 VITALS — BP 157/76 | HR 73 | Temp 96.9°F | Resp 20

## 2020-01-11 DIAGNOSIS — K859 Acute pancreatitis without necrosis or infection, unspecified: Secondary | ICD-10-CM

## 2020-01-11 DIAGNOSIS — R29898 Other symptoms and signs involving the musculoskeletal system: Secondary | ICD-10-CM | POA: Diagnosis not present

## 2020-01-11 DIAGNOSIS — K819 Cholecystitis, unspecified: Secondary | ICD-10-CM

## 2020-01-11 DIAGNOSIS — E538 Deficiency of other specified B group vitamins: Secondary | ICD-10-CM | POA: Diagnosis not present

## 2020-01-11 NOTE — Patient Instructions (Addendum)
Safety In The Home  Use a variety of textures, such as Velcro, rubber bands and raised dots to provide tactile clues.  Apply to the on/off controls on appliances, at the end of the banister, or on medicine bottles.  Flooring  The following suggestions can help reduce the risk of a fall:  Repair or replace torn carpet because a foot, cane or walker can easily get caught.  Remove area carpets or throw rugs, especially if your loved one has a shuffling gait or uses a walker.  When rugs  Or carpeting cannot be eliminated, place non-skid padding under rugs or secure to floor with double sided tape.  Area carpets without padding or tape can easily buckle underneath when walked on, causing a person to slip or fall.  Use only matte, non-shiny finishes on the floor.  Doorsills can be tripping hazards.  Remove them whenever possible or paint them a contrasting color.  Eliminate low furniture that is easy to trip over such as coffee tables and footstools.  Move furniture against walls to create a large area of uncluttered space in the center of the room.  However, if you are rearranging a room for someone else, discuss beforehand with that person.  Many individuals rely on specific locations of furniture to find their way around a room.  It is easier to see the sofa or chair when its color contrasts with that of the flooring.  Choose a fabric that contrasts with the floor material or use a bright colored piping along the edges of the seat cushion. Reduce glare on polished furniture by covering it with a large doily or tablecloth.  Fall Prevention in the Home, Adult Falls can cause injuries. They can happen to people of all ages. There are many things you can do to make your home safe and to help prevent falls. Ask for help when making these changes, if needed. What actions can I take to prevent falls? General Instructions  Use good lighting in all rooms. Replace any light bulbs that burn  out.  Turn on the lights when you go into a dark area. Use night-lights.  Keep items that you use often in easy-to-reach places. Lower the shelves around your home if necessary.  Set up your furniture so you have a clear path. Avoid moving your furniture around.  Do not have throw rugs and other things on the floor that can make you trip.  Avoid walking on wet floors.  If any of your floors are uneven, fix them.  Add color or contrast paint or tape to clearly mark and help you see: ? Any grab bars or handrails. ? First and last steps of stairways. ? Where the edge of each step is.  If you use a stepladder: ? Make sure that it is fully opened. Do not climb a closed stepladder. ? Make sure that both sides of the stepladder are locked into place. ? Ask someone to hold the stepladder for you while you use it.  If there are any pets around you, be aware of where they are. What can I do in the bathroom?      Keep the floor dry. Clean up any water that spills onto the floor as soon as it happens.  Remove soap buildup in the tub or shower regularly.  Use non-skid mats or decals on the floor of the tub or shower.  Attach bath mats securely with double-sided, non-slip rug tape.  If you need to sit down  in the shower, use a plastic, non-slip stool.  Install grab bars by the toilet and in the tub and shower. Do not use towel bars as grab bars. What can I do in the bedroom?  Make sure that you have a light by your bed that is easy to reach.  Do not use any sheets or blankets that are too big for your bed. They should not hang down onto the floor.  Have a firm chair that has side arms. You can use this for support while you get dressed. What can I do in the kitchen?  Clean up any spills right away.  If you need to reach something above you, use a strong step stool that has a grab bar.  Keep electrical cords out of the way.  Do not use floor polish or wax that makes floors  slippery. If you must use wax, use non-skid floor wax. What can I do with my stairs?  Do not leave any items on the stairs.  Make sure that you have a light switch at the top of the stairs and the bottom of the stairs. If you do not have them, ask someone to add them for you.  Make sure that there are handrails on both sides of the stairs, and use them. Fix handrails that are broken or loose. Make sure that handrails are as long as the stairways.  Install non-slip stair treads on all stairs in your home.  Avoid having throw rugs at the top or bottom of the stairs. If you do have throw rugs, attach them to the floor with carpet tape.  Choose a carpet that does not hide the edge of the steps on the stairway.  Check any carpeting to make sure that it is firmly attached to the stairs. Fix any carpet that is loose or worn. What can I do on the outside of my home?  Use bright outdoor lighting.  Regularly fix the edges of walkways and driveways and fix any cracks.  Remove anything that might make you trip as you walk through a door, such as a raised step or threshold.  Trim any bushes or trees on the path to your home.  Regularly check to see if handrails are loose or broken. Make sure that both sides of any steps have handrails.  Install guardrails along the edges of any raised decks and porches.  Clear walking paths of anything that might make someone trip, such as tools or rocks.  Have any leaves, snow, or ice cleared regularly.  Use sand or salt on walking paths during winter.  Clean up any spills in your garage right away. This includes grease or oil spills. What other actions can I take?  Wear shoes that: ? Have a low heel. Do not wear high heels. ? Have rubber bottoms. ? Are comfortable and fit you well. ? Are closed at the toe. Do not wear open-toe sandals.  Use tools that help you move around (mobility aids) if they are needed. These  include: ? Canes. ? Walkers. ? Scooters. ? Crutches.  Review your medicines with your doctor. Some medicines can make you feel dizzy. This can increase your chance of falling. Ask your doctor what other things you can do to help prevent falls. Where to find more information  Centers for Disease Control and Prevention, [employer]: https://garcia.biz/  [employer] on Aging: BrainJudge.co.uk Contact a doctor if:  You are afraid of falling at home.  You feel weak, drowsy,  or dizzy at home.  You fall at home. Summary  There are many simple things that you can do to make your home safe and to help prevent falls.  Ways to make your home safe include removing tripping hazards and installing grab bars in the bathroom.  Ask for help when making these changes in your home. This information is not intended to replace advice given to you by your health care provider. Make sure you discuss any questions you have with your health care provider. Document Revised: 06/16/2018 Document Reviewed: 10/08/2016 Elsevier Patient Education  2020 [employer].

## 2020-01-11 NOTE — Progress Notes (Signed)
Subjective:    Patient ID: Carlos Brooks, male    DOB: [birth year], 86 y.o.   MRN: [id number]   Chief Complaint: Hospitalization Follow-up   HPI Hospitalized 10/26-10/30 for acute pancreatitis, here for post hospital follow-up. Pt states still having severe pain upper abd area, 7/10 pain. Sitting certain ways makes pain worse. Eating bland and soft foods, does not have a large appetite. Daughter would like to d/c repatha & aricept, states they are more concerned with abd issues that with memory issues. Daughter states Repatha has not had any benefits. States more solid BM since discharge. Urinates independently. Pt also c/o cramping/numbness pain in right leg, has home PT coming on Saturday. F/u with surgeon next week. Follows up with GI in December.     Review of Systems  Constitutional: Negative.   HENT: Negative.   Eyes: Negative.   Respiratory: Negative.   Cardiovascular: Negative.   Gastrointestinal: Positive for abdominal pain.  Endocrine: Negative.   Genitourinary: Negative.   Musculoskeletal: Positive for gait problem.  Skin: Negative.   Allergic/Immunologic: Negative.   Hematological: Negative.   Psychiatric/Behavioral: Negative.   All other systems reviewed and are negative.      Objective:   Physical Exam Vitals and nursing note reviewed.  Constitutional:      Appearance: Normal appearance.  HENT:     Head: Normocephalic and atraumatic.     Right Ear: External ear normal.     Left Ear: External ear normal.     Nose: Nose normal.     Mouth/Throat:     Mouth: Mucous membranes are moist.     Pharynx: Oropharynx is clear.  Eyes:     Extraocular Movements: Extraocular movements intact.     Conjunctiva/sclera: Conjunctivae normal.     Pupils: Pupils are equal, round, and reactive to light.  Cardiovascular:     Rate and Rhythm: Normal rate and regular rhythm.     Pulses: Normal pulses.     Heart sounds: Normal heart sounds.  Pulmonary:     Effort:  Pulmonary effort is normal.     Breath sounds: Normal breath sounds.  Abdominal:     General: Abdomen is flat. Bowel sounds are decreased.     Palpations: Abdomen is soft.     Tenderness: There is abdominal tenderness in the right upper quadrant and left upper quadrant. There is guarding.  Musculoskeletal:        General: Normal range of motion.     Cervical back: Normal range of motion.     Right lower leg: Normal.     Left lower leg: Normal.  Skin:    General: Skin is warm and dry.     Capillary Refill: Capillary refill takes less than 2 seconds.  Neurological:     General: No focal deficit present.     Mental Status: He is alert and oriented to person, place, and time. Mental status is at baseline.  Psychiatric:        Mood and Affect: Mood normal.        Behavior: Behavior normal.        Thought Content: Thought content normal.        Judgment: Judgment normal.    BP (!) 157/76   Pulse 73   Temp (!) 96.9 F (36.1 C) (Temporal)   Resp 20   SpO2 96%       Assessment & Plan:  Carlos Brooks comes in today with chief complaint of Hospitalization Follow-up (Family  wants to take him off Repatha and aricept)   Diagnosis and orders addressed:  1. Acute pancreatitis, unspecified complication status, unspecified pancreatitis type Avoid spicy, greasy, or fried foods. Eat small, easy to digest meals verses large meals. Do not lie down after eating. Stay hydrated. Hospital records reviewed.  2. Acalculous cholecystitis Eat bland, soft foods. Keep follow up appointment with surgeon and GI specialist.   3. Weakness of right leg Keep follow up with PT. Make sure the home is safe and prevent falls as much as possible.   * ok to stop repatha-  * discussed stopping aricapt - told may decrease cognitive ability- daughter will talk with family and decide what to do.  Labs pending Health Maintenance reviewed Diet and exercise encouraged  Follow up plan: Follow up in 1 month.      Mary-Margaret Hassell Done, FNP

## 2020-01-12 ENCOUNTER — Ambulatory Visit: Payer: PPO

## 2020-01-12 DIAGNOSIS — Z85828 Personal history of other malignant neoplasm of skin: Secondary | ICD-10-CM | POA: Diagnosis not present

## 2020-01-12 DIAGNOSIS — M150 Primary generalized (osteo)arthritis: Secondary | ICD-10-CM | POA: Diagnosis not present

## 2020-01-12 DIAGNOSIS — E559 Vitamin D deficiency, unspecified: Secondary | ICD-10-CM | POA: Diagnosis not present

## 2020-01-12 DIAGNOSIS — E538 Deficiency of other specified B group vitamins: Secondary | ICD-10-CM | POA: Diagnosis not present

## 2020-01-12 DIAGNOSIS — N400 Enlarged prostate without lower urinary tract symptoms: Secondary | ICD-10-CM | POA: Diagnosis not present

## 2020-01-12 DIAGNOSIS — I447 Left bundle-branch block, unspecified: Secondary | ICD-10-CM | POA: Diagnosis not present

## 2020-01-12 DIAGNOSIS — J309 Allergic rhinitis, unspecified: Secondary | ICD-10-CM | POA: Diagnosis not present

## 2020-01-12 DIAGNOSIS — I2510 Atherosclerotic heart disease of native coronary artery without angina pectoris: Secondary | ICD-10-CM | POA: Diagnosis not present

## 2020-01-12 DIAGNOSIS — K449 Diaphragmatic hernia without obstruction or gangrene: Secondary | ICD-10-CM | POA: Diagnosis not present

## 2020-01-12 DIAGNOSIS — G629 Polyneuropathy, unspecified: Secondary | ICD-10-CM | POA: Diagnosis not present

## 2020-01-12 DIAGNOSIS — K219 Gastro-esophageal reflux disease without esophagitis: Secondary | ICD-10-CM | POA: Diagnosis not present

## 2020-01-12 DIAGNOSIS — R1310 Dysphagia, unspecified: Secondary | ICD-10-CM | POA: Diagnosis not present

## 2020-01-12 DIAGNOSIS — F5101 Primary insomnia: Secondary | ICD-10-CM | POA: Diagnosis not present

## 2020-01-12 DIAGNOSIS — K8040 Calculus of bile duct with cholecystitis, unspecified, without obstruction: Secondary | ICD-10-CM | POA: Diagnosis not present

## 2020-01-12 DIAGNOSIS — Z7982 Long term (current) use of aspirin: Secondary | ICD-10-CM | POA: Diagnosis not present

## 2020-01-12 DIAGNOSIS — I700 Atherosclerosis of aorta: Secondary | ICD-10-CM | POA: Diagnosis not present

## 2020-01-12 DIAGNOSIS — Z87891 Personal history of nicotine dependence: Secondary | ICD-10-CM | POA: Diagnosis not present

## 2020-01-12 DIAGNOSIS — J984 Other disorders of lung: Secondary | ICD-10-CM | POA: Diagnosis not present

## 2020-01-12 DIAGNOSIS — K859 Acute pancreatitis without necrosis or infection, unspecified: Secondary | ICD-10-CM | POA: Diagnosis not present

## 2020-01-12 DIAGNOSIS — J449 Chronic obstructive pulmonary disease, unspecified: Secondary | ICD-10-CM | POA: Diagnosis not present

## 2020-01-15 ENCOUNTER — Telehealth: Payer: Self-pay

## 2020-01-15 NOTE — Telephone Encounter (Signed)
VO given for [facility]

## 2020-01-15 NOTE — Telephone Encounter (Signed)
Verdis Frederickson called with [facility] stating that she needs verbal order for Home Health PT, 1x per week for 1 week and then 2x per week for 4 weeks and then 1x per week for 4 weeks.   Phone # [phone]

## 2020-01-16 ENCOUNTER — Other Ambulatory Visit: Payer: Self-pay

## 2020-01-16 ENCOUNTER — Ambulatory Visit: Payer: PPO | Admitting: General Surgery

## 2020-01-16 ENCOUNTER — Encounter: Payer: Self-pay | Admitting: General Surgery

## 2020-01-16 VITALS — BP 148/71 | HR 70 | Temp 98.5°F | Resp 12 | Ht 67.0 in | Wt 133.0 lb

## 2020-01-16 DIAGNOSIS — K819 Cholecystitis, unspecified: Secondary | ICD-10-CM | POA: Diagnosis not present

## 2020-01-16 DIAGNOSIS — K8500 Idiopathic acute pancreatitis without necrosis or infection: Secondary | ICD-10-CM | POA: Diagnosis not present

## 2020-01-16 NOTE — Patient Instructions (Signed)
Diet and activity as tolerated.  Gallbladder Eating Plan If you have a gallbladder condition, you may have trouble digesting fats. Eating a low-fat diet can help reduce your symptoms, and may be helpful before and after having surgery to remove your gallbladder (cholecystectomy). Your health care provider may recommend that you work with a diet and nutrition specialist (dietitian) to help you reduce the amount of fat in your diet. What are tips for following this plan? General guidelines  Limit your fat intake to less than 30% of your total daily calories. If you eat around 1,800 calories each day, this is less than 60 grams (g) of fat per day.  Fat is an important part of a healthy diet. Eating a low-fat diet can make it hard to maintain a healthy body weight. Ask your dietitian how much fat, calories, and other nutrients you need each day.  Eat small, frequent meals throughout the day instead of three large meals.  Drink at least 8-10 cups of fluid a day. Drink enough fluid to keep your urine clear or pale yellow.  Limit alcohol intake to no more than 1 drink a day for nonpregnant women and 2 drinks a day for men. One drink equals 12 oz of beer, 5 oz of wine, or 1 oz of hard liquor. Reading food labels  Check Nutrition Facts on food labels for the amount of fat per serving. Choose foods with less than 3 grams of fat per serving. Shopping  Choose nonfat and low-fat healthy foods. Look for the words "nonfat," "low fat," or "fat free."  Avoid buying processed or prepackaged foods. Cooking  Cook using low-fat methods, such as baking, broiling, grilling, or boiling.  Cook with small amounts of healthy fats, such as olive oil, grapeseed oil, canola oil, or sunflower oil. What foods are recommended?   All fresh, frozen, or canned fruits and vegetables.  Whole grains.  Low-fat or non-fat (skim) milk and yogurt.  Lean meat, skinless poultry, fish, eggs, and beans.  Low-fat protein  supplement powders or drinks.  Spices and herbs. What foods are not recommended?  High-fat foods. These include baked goods, fast food, fatty cuts of meat, ice cream, french toast, sweet rolls, pizza, cheese bread, foods covered with butter, creamy sauces, or cheese.  Fried foods. These include french fries, tempura, battered fish, breaded chicken, fried breads, and sweets.  Foods with strong odors.  Foods that cause bloating and gas. Summary  A low-fat diet can be helpful if you have a gallbladder condition, or before and after gallbladder surgery.  Limit your fat intake to less than 30% of your total daily calories. This is about 60 g of fat if you eat 1,800 calories each day.  Eat small, frequent meals throughout the day instead of three large meals. This information is not intended to replace advice given to you by your health care provider. Make sure you discuss any questions you have with your health care provider. Document Revised: 06/16/2018 Document Reviewed: 04/02/2016 Elsevier Patient Education  2020 [place].   Pancreatitis Eating Plan Pancreatitis is when your pancreas becomes irritated and swollen (inflamed). The pancreas is a small organ located behind your stomach. It helps your body digest food and regulate your blood sugar. Pancreatitis can affect how your body digests food, especially foods with fat. You may also have other symptoms such as abdominal pain or nausea. When you have pancreatitis, following a low-fat eating plan may help you manage symptoms and recover more quickly. Work  with your health care provider or a diet and nutrition specialist (dietitian) to create an eating plan that is right for you. What are tips for following this plan? Reading food labels Use the information on food labels to help keep track of how much fat you eat:  Check the serving size.  Look for the amount of total fat in grams (g) in one serving. ? Low-fat foods have 3 g of  fat or less per serving. ? Fat-free foods have 0.5 g of fat or less per serving.  Keep track of how much fat you eat based on how many servings you eat. ? For example, if you eat two servings, the amount of fat you eat will be two times what is listed on the label. Shopping   Buy low-fat or nonfat foods, such as: ? Fresh, frozen, or canned fruits and vegetables. ? Grains, including pasta, bread, and rice. ? Lean meat, poultry, fish, and other protein foods. ? Low-fat or nonfat dairy.  Avoid buying bakery products and other sweets made with whole milk, butter, and eggs.  Avoid buying snack foods with added fat, such as anything with butter or cheese flavoring. Cooking  Remove skin from poultry, and remove extra fat from meat.  Limit the amount of fat and oil you use to 6 teaspoons or less per day.  Cook using low-fat methods, such as boiling, broiling, grilling, steaming, or baking.  Use spray oil to cook. Add fat-free chicken broth to add flavor and moisture.  Avoid adding cream to thicken soups or sauces. Use other thickeners such as corn starch or tomato paste. Meal planning   Eat a low-fat diet as told by your dietitian. For most people, this means having no more than 55-65 grams of fat each day.  Eat small, frequent meals throughout the day. For example, you may have 5-6 small meals instead of 3 large meals.  Drink enough fluid to keep your urine pale yellow.  Do not drink alcohol. Talk to your health care provider if you need help stopping.  Limit how much caffeine you have, including black coffee, black and green tea, caffeinated soft drinks, and energy drinks. General information  Let your health care provider or dietitian know if you have unplanned weight loss on this eating plan.  You may be instructed to follow a clear liquid diet during a flare of symptoms. Talk with your health care provider about how to manage your diet during symptoms of a flare.  Take any  vitamins or supplements as told by your health care provider.  Work with a Microbiologist, especially if you have other conditions such as obesity or diabetes mellitus. What foods should I avoid? Fruits Fried fruits. Fruits served with butter or cream. Vegetables Fried vegetables. Vegetables cooked with butter, cheese, or cream. Grains Biscuits, waffles, donuts, pastries, and croissants. Pies and cookies. Butter-flavored popcorn. Regular crackers. Meats and other protein foods Fatty cuts of meat. Poultry with skin. Organ meats. Bacon, sausage, and cold cuts. Whole eggs. Nuts and nut butters. Dairy Whole and 2% milk. Whole milk yogurt. Whole milk ice cream. Cream and half-and-half. Cream cheese. Sour cream. Cheese. Beverages Wine, beer, and liquor. The items listed above may not be a complete list of foods and beverages to avoid. Contact a dietitian for more information. Summary  Pancreatitis can affect how your body digests food, especially foods with fat.  When you have pancreatitis, it is recommended that you follow a low-fat eating plan to help you  recover more quickly and manage symptoms. For most people, this means limiting fat to no more than 55-65 grams per day.  Do not drink alcohol. Limit the amount of caffeine you have, and drink enough fluid to keep your urine pale yellow. This information is not intended to replace advice given to you by your health care provider. Make sure you discuss any questions you have with your health care provider. Document Revised: 06/16/2018 Document Reviewed: 06/01/2017 Elsevier Patient Education  [place].

## 2020-01-16 NOTE — Progress Notes (Signed)
[facility] Note   HPI:  86 y.o. Male presents to clinic for follow-up evaluation after being in the hospital for acalculous cholecystitis and pancreatitis of unknown origin. He has finished his antibiotics. He is starting to eat more. He is feeling better.  Review of Systems:  No fever or chills Improving pain All other review of systems: otherwise negative   Vital Signs:  BP (!) 148/71   Pulse 70   Temp 98.5 F (36.9 C) (Oral)   Resp 12   Ht 5\' 7"  (1.702 m)   Wt 133 lb (60.3 kg)   SpO2 92%   BMI 20.83 kg/m    Physical Exam:  Physical Exam Vitals reviewed.  Cardiovascular:     Rate and Rhythm: Normal rate.  Pulmonary:     Effort: Pulmonary effort is normal.  Abdominal:     General: There is no distension.     Palpations: Abdomen is soft.     Tenderness: There is abdominal tenderness.     Comments: RUQ mildly tender  Neurological:     Mental Status: He is alert.      Assessment:  86 y.o. yo Male with acalculous cholecystitis and pancreatitis of unknown origin. We have discussed that given his age and co-morbidities that surgery is high risk. Discussed that there is a possibility of cholecystitis occurring again and need for cholecystostomy tube versus decision for surgery at that time.   Plan:  - For now will hold on any surgery or further treatment  - Activity and diet as tolerated, avoid fatty food   All of the above recommendations were discussed with the patient and patient's family, and all of patient's and family's questions were answered to their expressed satisfaction.  Curlene Labrum, MD [facility] [address] [place], [place] [phone] (office)

## 2020-01-18 DIAGNOSIS — K859 Acute pancreatitis without necrosis or infection, unspecified: Secondary | ICD-10-CM | POA: Diagnosis not present

## 2020-01-18 DIAGNOSIS — R1013 Epigastric pain: Secondary | ICD-10-CM | POA: Diagnosis not present

## 2020-01-26 ENCOUNTER — Ambulatory Visit (INDEPENDENT_AMBULATORY_CARE_PROVIDER_SITE_OTHER): Payer: PPO

## 2020-01-26 ENCOUNTER — Other Ambulatory Visit: Payer: Self-pay

## 2020-01-26 DIAGNOSIS — E559 Vitamin D deficiency, unspecified: Secondary | ICD-10-CM

## 2020-01-26 DIAGNOSIS — Z85828 Personal history of other malignant neoplasm of skin: Secondary | ICD-10-CM | POA: Diagnosis not present

## 2020-01-26 DIAGNOSIS — I447 Left bundle-branch block, unspecified: Secondary | ICD-10-CM

## 2020-01-26 DIAGNOSIS — I2510 Atherosclerotic heart disease of native coronary artery without angina pectoris: Secondary | ICD-10-CM | POA: Diagnosis not present

## 2020-01-26 DIAGNOSIS — J309 Allergic rhinitis, unspecified: Secondary | ICD-10-CM | POA: Diagnosis not present

## 2020-01-26 DIAGNOSIS — M150 Primary generalized (osteo)arthritis: Secondary | ICD-10-CM

## 2020-01-26 DIAGNOSIS — F5101 Primary insomnia: Secondary | ICD-10-CM | POA: Diagnosis not present

## 2020-01-26 DIAGNOSIS — G629 Polyneuropathy, unspecified: Secondary | ICD-10-CM | POA: Diagnosis not present

## 2020-01-26 DIAGNOSIS — K449 Diaphragmatic hernia without obstruction or gangrene: Secondary | ICD-10-CM | POA: Diagnosis not present

## 2020-01-26 DIAGNOSIS — K859 Acute pancreatitis without necrosis or infection, unspecified: Secondary | ICD-10-CM

## 2020-01-26 DIAGNOSIS — Z7982 Long term (current) use of aspirin: Secondary | ICD-10-CM | POA: Diagnosis not present

## 2020-01-26 DIAGNOSIS — E538 Deficiency of other specified B group vitamins: Secondary | ICD-10-CM | POA: Diagnosis not present

## 2020-01-26 DIAGNOSIS — J984 Other disorders of lung: Secondary | ICD-10-CM

## 2020-01-26 DIAGNOSIS — K8040 Calculus of bile duct with cholecystitis, unspecified, without obstruction: Secondary | ICD-10-CM | POA: Diagnosis not present

## 2020-01-26 DIAGNOSIS — K219 Gastro-esophageal reflux disease without esophagitis: Secondary | ICD-10-CM

## 2020-01-26 DIAGNOSIS — R1310 Dysphagia, unspecified: Secondary | ICD-10-CM

## 2020-01-26 DIAGNOSIS — I700 Atherosclerosis of aorta: Secondary | ICD-10-CM

## 2020-01-26 DIAGNOSIS — Z87891 Personal history of nicotine dependence: Secondary | ICD-10-CM

## 2020-01-26 DIAGNOSIS — N400 Enlarged prostate without lower urinary tract symptoms: Secondary | ICD-10-CM

## 2020-01-26 DIAGNOSIS — J449 Chronic obstructive pulmonary disease, unspecified: Secondary | ICD-10-CM

## 2020-02-06 ENCOUNTER — Ambulatory Visit: Payer: Self-pay | Admitting: Adult Health

## 2020-02-08 ENCOUNTER — Ambulatory Visit (INDEPENDENT_AMBULATORY_CARE_PROVIDER_SITE_OTHER): Payer: PPO | Admitting: Nurse Practitioner

## 2020-02-08 ENCOUNTER — Other Ambulatory Visit: Payer: Self-pay

## 2020-02-08 ENCOUNTER — Encounter: Payer: Self-pay | Admitting: Nurse Practitioner

## 2020-02-08 VITALS — BP 132/62 | HR 64 | Temp 96.7°F | Resp 20 | Ht 67.0 in | Wt 133.0 lb

## 2020-02-08 DIAGNOSIS — Z85828 Personal history of other malignant neoplasm of skin: Secondary | ICD-10-CM | POA: Diagnosis not present

## 2020-02-08 DIAGNOSIS — K449 Diaphragmatic hernia without obstruction or gangrene: Secondary | ICD-10-CM | POA: Diagnosis not present

## 2020-02-08 DIAGNOSIS — G629 Polyneuropathy, unspecified: Secondary | ICD-10-CM

## 2020-02-08 DIAGNOSIS — E785 Hyperlipidemia, unspecified: Secondary | ICD-10-CM | POA: Diagnosis not present

## 2020-02-08 DIAGNOSIS — R413 Other amnesia: Secondary | ICD-10-CM | POA: Diagnosis not present

## 2020-02-08 DIAGNOSIS — J309 Allergic rhinitis, unspecified: Secondary | ICD-10-CM | POA: Diagnosis not present

## 2020-02-08 DIAGNOSIS — J984 Other disorders of lung: Secondary | ICD-10-CM | POA: Diagnosis not present

## 2020-02-08 DIAGNOSIS — F5101 Primary insomnia: Secondary | ICD-10-CM | POA: Diagnosis not present

## 2020-02-08 DIAGNOSIS — E559 Vitamin D deficiency, unspecified: Secondary | ICD-10-CM

## 2020-02-08 DIAGNOSIS — K219 Gastro-esophageal reflux disease without esophagitis: Secondary | ICD-10-CM

## 2020-02-08 DIAGNOSIS — K8040 Calculus of bile duct with cholecystitis, unspecified, without obstruction: Secondary | ICD-10-CM | POA: Diagnosis not present

## 2020-02-08 DIAGNOSIS — E538 Deficiency of other specified B group vitamins: Secondary | ICD-10-CM | POA: Diagnosis not present

## 2020-02-08 DIAGNOSIS — I700 Atherosclerosis of aorta: Secondary | ICD-10-CM | POA: Diagnosis not present

## 2020-02-08 DIAGNOSIS — N400 Enlarged prostate without lower urinary tract symptoms: Secondary | ICD-10-CM | POA: Diagnosis not present

## 2020-02-08 DIAGNOSIS — R1310 Dysphagia, unspecified: Secondary | ICD-10-CM | POA: Diagnosis not present

## 2020-02-08 DIAGNOSIS — K859 Acute pancreatitis without necrosis or infection, unspecified: Secondary | ICD-10-CM | POA: Diagnosis not present

## 2020-02-08 DIAGNOSIS — I447 Left bundle-branch block, unspecified: Secondary | ICD-10-CM | POA: Diagnosis not present

## 2020-02-08 DIAGNOSIS — I2510 Atherosclerotic heart disease of native coronary artery without angina pectoris: Secondary | ICD-10-CM | POA: Diagnosis not present

## 2020-02-08 DIAGNOSIS — J449 Chronic obstructive pulmonary disease, unspecified: Secondary | ICD-10-CM

## 2020-02-08 DIAGNOSIS — Z87891 Personal history of nicotine dependence: Secondary | ICD-10-CM | POA: Diagnosis not present

## 2020-02-08 DIAGNOSIS — Z7982 Long term (current) use of aspirin: Secondary | ICD-10-CM | POA: Diagnosis not present

## 2020-02-08 DIAGNOSIS — J4489 Other specified chronic obstructive pulmonary disease: Secondary | ICD-10-CM

## 2020-02-08 DIAGNOSIS — M150 Primary generalized (osteo)arthritis: Secondary | ICD-10-CM | POA: Diagnosis not present

## 2020-02-08 MED ORDER — PANTOPRAZOLE SODIUM 40 MG PO TBEC: 40.0000 mg | DELAYED_RELEASE_TABLET | Freq: Every day | ORAL | 5 refills | Status: DC

## 2020-02-08 MED ORDER — MEMANTINE HCL 5 MG PO TABS: 5.0000 mg | ORAL_TABLET | Freq: Three times a day (TID) | ORAL | 5 refills | Status: DC

## 2020-02-08 MED ORDER — CYANOCOBALAMIN 1000 MCG/ML IJ SOLN: 1000.0000 ug | Freq: Once | INTRAMUSCULAR | 5 refills | Status: AC

## 2020-02-08 MED ORDER — TRELEGY ELLIPTA 100-62.5-25 MCG/INH IN AEPB: 1.0000 | INHALATION_SPRAY | Freq: Every day | RESPIRATORY_TRACT | 11 refills | Status: DC

## 2020-02-08 MED ORDER — TERAZOSIN HCL 5 MG PO CAPS: ORAL_CAPSULE | ORAL | 1 refills | Status: DC

## 2020-02-08 MED ORDER — DONEPEZIL HCL 5 MG PO TABS: 5.0000 mg | ORAL_TABLET | Freq: Every day | ORAL | 5 refills | Status: DC

## 2020-02-08 MED ORDER — FINASTERIDE 5 MG PO TABS: 5.0000 mg | ORAL_TABLET | Freq: Every day | ORAL | 1 refills | Status: DC

## 2020-02-08 MED ORDER — LORAZEPAM 0.5 MG PO TABS: ORAL_TABLET | ORAL | 5 refills | Status: DC

## 2020-02-08 NOTE — Progress Notes (Signed)
Subjective:    Patient ID: Carlos Brooks, male    DOB: [birth year], 86 y.o.   MRN: [id number]   Chief Complaint: Medical Management of Chronic Issues    HPI:  1. Hyperlipidemia LDL goal <100 Does watch diet but does not do much exerise. Lab Results  Component Value Date   CHOL 121 10/16/2019   HDL 51 10/16/2019   LDLCALC 34 10/16/2019   TRIG 235 (H) 10/16/2019   CHOLHDL 2.4 10/16/2019     2. Aortic atherosclerosis ([place]) Has not seen cardiology in awile and says he does not feel need ot go.  3. COPD (chronic obstructive pulmonary disease) with chronic bronchitis ([place]) Doing well.  Is on trelegy and that has really helped  4. Gastroesophageal reflux disease without esophagitis Is on protonix daily and says he has not had any reflux symptoms in awhile  5. Neuropathy Has some numbness in feet. Do better when he is up walking around.  6. Benign prostatic hyperplasia without lower urinary tract symptoms Denies any problems passing his water. Is on proscar daily.  7. Primary insomnia Has been sleeping well. Usually sleeps about 8 hours a night.  8. Vitamin B12 deficiency Will get injection today  9. Vitamin D deficiency Takes daily vitamin d supplement  10. memory loss Is on namenda and aricept. He is doing well. Repeats hisself a lot. Mainly his short term memory is a problem.  Outpatient Encounter Medications as of 02/08/2020  Medication Sig   aspirin 81 MG EC tablet Take 1 tablet (81 mg total) by mouth daily.   Evolocumab (REPATHA SURECLICK) 924 MG/ML SOAJ Inject 140 mg into the skin every 14 (fourteen) days.   finasteride (PROSCAR) 5 MG tablet Take 1 tablet (5 mg total) by mouth daily.   fluticasone (FLONASE) 50 MCG/ACT nasal spray Place 2 sprays into both nostrils daily.   Fluticasone-Umeclidin-Vilant (TRELEGY ELLIPTA) 100-62.5-25 MCG/INH AEPB Inhale 1 puff into the lungs daily.   guaiFENesin (MUCINEX) 600 MG 12 hr tablet Take 2 tablets (1,200 mg total)  by mouth 2 (two) times daily as needed.   levocetirizine (XYZAL) 5 MG tablet Take 0.5 tablets (2.5 mg total) by mouth every evening.   LORazepam (ATIVAN) 0.5 MG tablet Take nightly as needed for sleep (Patient taking differently: Take 0.5 mg by mouth at bedtime as needed for sleep. )   memantine (NAMENDA) 5 MG tablet Take 1 tablet (5 mg total) by mouth in the morning, at noon, and at bedtime.   pantoprazole (PROTONIX) 40 MG tablet Take 1 tablet (40 mg total) by mouth daily.   PROAIR HFA 108 (90 Base) MCG/ACT inhaler 2 PUFFS EVERY 6 HOURS AS NEEDED FOR WHEEZING OR SHORTNESS OF BREATH (Patient taking differently: Inhale 1-2 puffs into the lungs every 6 (six) hours as needed for wheezing or shortness of breath. )   terazosin (HYTRIN) 5 MG capsule TAKE (1) CAPSULE DAILY (Patient taking differently: Take 5 mg by mouth daily. )   donepezil (ARICEPT) 5 MG tablet Take 1 tablet (5 mg total) by mouth at bedtime.     Past Surgical History:  Procedure Laterality Date   EYE SURGERY     HERNIA REPAIR     x 3, inguinal    skin cancer removal     head    Family History  Problem Relation Age of Onset   Cancer Mother        ovairian   Cancer Father        lung  Heart attack Brother 67       Not clear details   Heart attack Son 85       Died with an MI   Heart disease Son    Cancer Brother        liver   Heart attack Brother 19   Aneurysm Brother    Hypertension Son    Obesity Son    Asthma Son     New complaints: None today  Social history: Lives with his wife  Controlled substance contract: n/a    Review of Systems  Constitutional: Negative for diaphoresis.  Eyes: Negative for pain.  Respiratory: Negative for shortness of breath.   Cardiovascular: Negative for chest pain, palpitations and leg swelling.  Gastrointestinal: Negative for abdominal pain.  Endocrine: Negative for polydipsia.  Skin: Negative for rash.  Neurological: Negative for dizziness,  weakness and headaches.  Hematological: Does not bruise/bleed easily.  All other systems reviewed and are negative.      Objective:   Physical Exam Vitals and nursing note reviewed.  Constitutional:      Appearance: Normal appearance. He is well-developed.  HENT:     Head: Normocephalic.     Nose: Nose normal.  Eyes:     Pupils: Pupils are equal, round, and reactive to light.  Neck:     Thyroid: No thyroid mass or thyromegaly.     Vascular: No carotid bruit or JVD.     Trachea: Phonation normal.  Cardiovascular:     Rate and Rhythm: Normal rate and regular rhythm.  Pulmonary:     Effort: Pulmonary effort is normal. No respiratory distress.     Breath sounds: Normal breath sounds.  Abdominal:     General: Bowel sounds are normal.     Palpations: Abdomen is soft.     Tenderness: There is no abdominal tenderness.  Musculoskeletal:        General: Normal range of motion.     Cervical back: Normal range of motion and neck supple.     Comments: Walking with a cane  Lymphadenopathy:     Cervical: No cervical adenopathy.  Skin:    General: Skin is warm and dry.  Neurological:     Mental Status: He is alert and oriented to person, place, and time.  Psychiatric:        Behavior: Behavior normal.        Thought Content: Thought content normal.        Judgment: Judgment normal.    BP 132/62    Pulse 64    Temp (!) 96.7 F (35.9 C) (Temporal)    Resp 20    Ht 5\' 7"  (1.702 m)    Wt 133 lb (60.3 kg)    SpO2 92%    BMI 20.83 kg/m         Assessment & Plan:  Carlos Brooks comes in today with chief complaint of Medical Management of Chronic Issues   Diagnosis and orders addressed:  1. Hyperlipidemia LDL goal <100 Low fat diet  2. Aortic atherosclerosis ([place])  3. COPD (chronic obstructive pulmonary disease) with chronic bronchitis ([place]) - Fluticasone-Umeclidin-Vilant (TRELEGY ELLIPTA) 100-62.5-25 MCG/INH AEPB; Inhale 1 puff into the lungs daily.  Dispense: 28 each;  Refill: 11  4. Gastroesophageal reflux disease without esophagitis Avoid spicy foods Do not eat 2 hours prior to bedtime - pantoprazole (PROTONIX) 40 MG tablet; Take 1 tablet (40 mg total) by mouth daily.  Dispense: 30 tablet; Refill: 5  5. Neuropathy  Do not go barefooted  6. Benign prostatic hyperplasia without lower urinary tract symptoms - finasteride (PROSCAR) 5 MG tablet; Take 1 tablet (5 mg total) by mouth daily.  Dispense: 90 tablet; Refill: 1 - terazosin (HYTRIN) 5 MG capsule; TAKE (1) CAPSULE DAILY  Dispense: 90 capsule; Refill: 1  7. Primary insomnia Bedtime routine - LORazepam (ATIVAN) 0.5 MG tablet; Take nightly as needed for sleep  Dispense: 30 tablet; Refill: 5  8. Vitamin B12 deficiency Will start doing b12 injections at home - cyanocobalamin (,VITAMIN B-12,) 1000 MCG/ML injection; Inject 1 mL (1,000 mcg total) into the muscle once for 1 dose.  Dispense: 10 mL; Refill: 5  9. Vitamin D deficiency Daily vitamin d supplement  10. Memory loss [place] daily - memantine (NAMENDA) 5 MG tablet; Take 1 tablet (5 mg total) by mouth in the morning, at noon, and at bedtime.  Dispense: 90 tablet; Refill: 5 - donepezil (ARICEPT) 5 MG tablet; Take 1 tablet (5 mg total) by mouth at bedtime.  Dispense: 30 tablet; Refill: 5  11. COPD (chronic obstructive pulmonary disease) with chronic bronchitis ([place]) - Fluticasone-Umeclidin-Vilant (TRELEGY ELLIPTA) 100-62.5-25 MCG/INH AEPB; Inhale 1 puff into the lungs daily.  Dispense: 28 each; Refill: 11   Labs pending Health Maintenance reviewed Diet and exercise encouraged  Follow up plan: 6 months   Mary-Margaret Hassell Done, FNP

## 2020-02-08 NOTE — Patient Instructions (Signed)

## 2020-02-15 ENCOUNTER — Encounter (INDEPENDENT_AMBULATORY_CARE_PROVIDER_SITE_OTHER): Payer: Self-pay | Admitting: Gastroenterology

## 2020-02-15 ENCOUNTER — Other Ambulatory Visit: Payer: Self-pay

## 2020-02-15 ENCOUNTER — Ambulatory Visit (INDEPENDENT_AMBULATORY_CARE_PROVIDER_SITE_OTHER): Payer: PPO | Admitting: Gastroenterology

## 2020-02-15 VITALS — BP 141/65 | HR 65 | Temp 98.3°F | Ht 67.0 in | Wt 132.0 lb

## 2020-02-15 DIAGNOSIS — K819 Cholecystitis, unspecified: Secondary | ICD-10-CM | POA: Diagnosis not present

## 2020-02-15 MED ORDER — URSODIOL 300 MG PO CAPS: 300.0000 mg | ORAL_CAPSULE | Freq: Three times a day (TID) | ORAL | 11 refills | Status: DC

## 2020-02-15 NOTE — Patient Instructions (Signed)
Start ursodiol 300 mg TID indefinitely

## 2020-02-15 NOTE — Progress Notes (Signed)
Carlos Brooks, M.D. Gastroenterology & Hepatology [facility] [address] [place], [place] [address] Primary Care Physician: [name], [place] [address]  Referring MD: PCP  I will communicate my assessment and recommendations to the referring MD via EMR. Note: Occasional unusual wording and randomly placed punctuation marks may result from the use of speech recognition technology to transcribe this document"  Chief Complaint: Follow-up acalculus cholecystitis  History of Present Illness: Carlos Brooks is a 86 y.o. male with past medical history of BPH, skin cancer, COPD, GERD, hyperlipidemia, hiatal hernia, dementia and recent diagnosis of acalculous cholecystitis, who presents for evaluation after recent hospitalization.  The patient was hospitalized on 01/02/2020 after presenting altered mental status and new onset abdominal pain. The patient was found to have leukocytosis upon admission and underwent abdominal imaging with CT of the abdomen and you that showed thickened gallbladder with edema.  Subsequent ultrasound of the abdomen confirmed presence of diffuse gallbladder inflammation with a small volume of free fluid but no presence of gallstones.  MRI of the abdomen with and with IV contrast showed normal pancreas the biliary tree but presence of gallbladder thickening consistent with acalculous cholecystitis.  Due to this, the patient had an evaluation by surgical service but given his periprocedural risk no surgical intervention was performed.  Patient received an antibiotic course with Zosyn while hospitalized, he was eventually discharged on Augmentin for a total of 7 days.  States since he was discharged his abdominal pain has improved substiantially.  He states that he does not have any abdominal pain or any complaints at the moment.  No changes in appetite.The patient denies having any  nausea, vomiting, fever, chills, hematochezia, melena, hematemesis, abdominal distention, abdominal pain, diarrhea, jaundice, pruritus or weight loss.  Patient comes with his daughter who corroborates his history  Has been working to regain strength with is physical therapist, he is getting more active every day.  Notably, the patient was seen by Dr. [name] on 01/16/2020 who considered adequate to hold off on any surgical intervention unless is really desiring given his surgical risk.  Past Medical History: Past Medical History:  Diagnosis Date  . BPH (benign prostatic hyperplasia)   . Cancer ([place])    skin  . Cataract   . COPD (chronic obstructive pulmonary disease) ([place])   . Generalized headaches   . GERD (gastroesophageal reflux disease)   . Hiatal hernia   . Hyperlipidemia     Past Surgical History: Past Surgical History:  Procedure Laterality Date  . EYE SURGERY    . HERNIA REPAIR     x 3, inguinal   . skin cancer removal     head    Family History: Family History  Problem Relation Age of Onset  . Cancer Mother        [name]  . Cancer Father        lung  . Heart attack Brother 70       Not clear details  . Heart attack Son 64       Died with an MI  . Heart disease Son   . Cancer Brother        liver  . Heart attack Brother 34  . Aneurysm Brother   . Hypertension Son   . Obesity Son   . Asthma Son     Social History: Social History   Tobacco Use  Smoking Status Former Smoker  . Quit date:  04/13/1993  . Years since quitting: 26.8  Smokeless Tobacco Never Used   Social History   Substance and Sexual Activity  Alcohol Use No   Social History   Substance and Sexual Activity  Drug Use No    Allergies: Allergies  Allergen Reactions  . Clarithromycin   . Crestor [Rosuvastatin Calcium]   . Lipitor [Atorvastatin Calcium]   . Niaspan [Niacin Er]   . Pneumovax [Pneumococcal Polysaccharide Vaccine]     Arm swelling and rash  . Pravachol   .  Ranitidine Hcl   . Simvastatin     Medications: Current Outpatient Medications  Medication Sig Dispense Refill  . aspirin 81 MG EC tablet Take 1 tablet (81 mg total) by mouth daily. 30 tablet 12  . donepezil (ARICEPT) 5 MG tablet Take 1 tablet (5 mg total) by mouth at bedtime. 30 tablet 5  . finasteride (PROSCAR) 5 MG tablet Take 1 tablet (5 mg total) by mouth daily. 90 tablet 1  . fluticasone (FLONASE) 50 MCG/ACT nasal spray Place 2 sprays into both nostrils daily. 16 g 6  . Fluticasone-Umeclidin-Vilant (TRELEGY ELLIPTA) 100-62.5-25 MCG/INH AEPB Inhale 1 puff into the lungs daily. 28 each 11  . guaiFENesin (MUCINEX) 600 MG 12 hr tablet Take 2 tablets (1,200 mg total) by mouth 2 (two) times daily as needed.    Marland Kitchen levocetirizine (XYZAL) 5 MG tablet Take 0.5 tablets (2.5 mg total) by mouth every evening. 30 tablet 5  . LORazepam (ATIVAN) 0.5 MG tablet Take nightly as needed for sleep 30 tablet 5  . memantine (NAMENDA) 5 MG tablet Take 1 tablet (5 mg total) by mouth in the morning, at noon, and at bedtime. 90 tablet 5  . OVER THE COUNTER MEDICATION Vit b 12 1000 IM Q month    . pantoprazole (PROTONIX) 40 MG tablet Take 1 tablet (40 mg total) by mouth daily. 30 tablet 5  . PROAIR HFA 108 (90 Base) MCG/ACT inhaler 2 PUFFS EVERY 6 HOURS AS NEEDED FOR WHEEZING OR SHORTNESS OF BREATH (Patient taking differently: Inhale 1-2 puffs into the lungs every 6 (six) hours as needed for wheezing or shortness of breath.) 8.5 g 11  . terazosin (HYTRIN) 5 MG capsule TAKE (1) CAPSULE DAILY 90 capsule 1   Current Facility-Administered Medications  Medication Dose Route Frequency Provider Last Rate Last Admin  . cyanocobalamin ((VITAMIN B-12)) injection 1,000 mcg  1,000 mcg Intramuscular Q30 days [name], FNP   1,000 mcg at 02/08/20 1526    Review of Systems: GENERAL: negative for malaise, night sweats HEENT: No changes in hearing or vision, no nose bleeds or other nasal problems. NECK: Negative  for lumps, goiter, pain and significant neck swelling RESPIRATORY: Negative for cough, wheezing CARDIOVASCULAR: Negative for chest pain, leg swelling, palpitations, orthopnea GI: SEE HPI MUSCULOSKELETAL: Negative for joint pain or swelling, back pain, and muscle pain. SKIN: Negative for lesions, rash PSYCH: Negative for sleep disturbance, mood disorder and recent psychosocial stressors. HEMATOLOGY Negative for prolonged bleeding, bruising easily, and swollen nodes. ENDOCRINE: Negative for cold or heat intolerance, polyuria, polydipsia and goiter. NEURO: negative for tremor, gait imbalance, syncope and seizures. The remainder of the review of systems is noncontributory.   Physical Exam: BP (!) 141/65 (BP Location: Left Arm, Patient Position: Sitting, Cuff Size: Large)   Pulse 65   Temp 98.3 F (36.8 C) (Oral)   Ht 5\' 7"  (1.702 m)   Wt 132 lb (59.9 kg)   BMI 20.67 kg/m  GENERAL: The patient is AO x3,  in no acute distress. Elder. Uses a cane. HEENT: Head is normocephalic and atraumatic. EOMI are intact. Mouth is well hydrated and without lesions. NECK: Supple. No masses LUNGS: Clear to auscultation. No presence of rhonchi/wheezing/rales. Adequate chest expansion HEART: RRR, normal s1 and s2. ABDOMEN: Soft, nontender, no guarding, no peritoneal signs, and nondistended. BS +. No masses. EXTREMITIES: Without any cyanosis, clubbing, rash, lesions or edema. NEUROLOGIC: AOx3, no focal motor deficit. SKIN: no jaundice, no rashes   Imaging/Labs: as above  I personally reviewed and interpreted the available labs, imaging and endoscopic files.  Impression and Plan: Carlos Brooks is a 86 y.o. male with past medical history of BPH, skin cancer, COPD, GERD, hyperlipidemia, hiatal hernia, dementia and recent diagnosis of acalculous cholecystitis, who presents for evaluation after recent hospitalization.  The patient had successful treatment of his acalculus cholecystitis with antibiotics  and has remained symptom-free.  I explained to the daughter that at this point no further evaluation is warranted but I will recommend starting course of 0 to avoid further episodes in the future and avoid any surgical interventions.  Both the daughter and patient understood and agreed.  - Start ursodiol 300 mg TID - No further endoscopic intervention warranted  All questions were answered.      Carlos Peppers, MD Gastroenterology and Hepatology [facility]

## 2020-02-16 ENCOUNTER — Ambulatory Visit (INDEPENDENT_AMBULATORY_CARE_PROVIDER_SITE_OTHER): Payer: PPO | Admitting: Gastroenterology

## 2020-02-21 ENCOUNTER — Telehealth: Payer: Self-pay

## 2020-02-21 NOTE — Telephone Encounter (Signed)
Appt made for January. Daughter notified and verbalized understanding

## 2020-02-21 NOTE — Telephone Encounter (Signed)
Arbie Cookey called for pt stating that she just wants pt to be able to make appts to get B12 shots here at our office instead of the OTC Rx that MMM sent in for him.  Please advise and call Arbie Cookey back.

## 2020-02-27 DIAGNOSIS — F5101 Primary insomnia: Secondary | ICD-10-CM | POA: Diagnosis not present

## 2020-02-27 DIAGNOSIS — J449 Chronic obstructive pulmonary disease, unspecified: Secondary | ICD-10-CM | POA: Diagnosis not present

## 2020-02-27 DIAGNOSIS — N400 Enlarged prostate without lower urinary tract symptoms: Secondary | ICD-10-CM | POA: Diagnosis not present

## 2020-02-27 DIAGNOSIS — E559 Vitamin D deficiency, unspecified: Secondary | ICD-10-CM | POA: Diagnosis not present

## 2020-02-27 DIAGNOSIS — Z85828 Personal history of other malignant neoplasm of skin: Secondary | ICD-10-CM | POA: Diagnosis not present

## 2020-02-27 DIAGNOSIS — Z7982 Long term (current) use of aspirin: Secondary | ICD-10-CM | POA: Diagnosis not present

## 2020-02-27 DIAGNOSIS — K859 Acute pancreatitis without necrosis or infection, unspecified: Secondary | ICD-10-CM | POA: Diagnosis not present

## 2020-02-27 DIAGNOSIS — J984 Other disorders of lung: Secondary | ICD-10-CM | POA: Diagnosis not present

## 2020-02-27 DIAGNOSIS — Z87891 Personal history of nicotine dependence: Secondary | ICD-10-CM | POA: Diagnosis not present

## 2020-02-27 DIAGNOSIS — K449 Diaphragmatic hernia without obstruction or gangrene: Secondary | ICD-10-CM | POA: Diagnosis not present

## 2020-02-27 DIAGNOSIS — I447 Left bundle-branch block, unspecified: Secondary | ICD-10-CM | POA: Diagnosis not present

## 2020-02-27 DIAGNOSIS — G629 Polyneuropathy, unspecified: Secondary | ICD-10-CM | POA: Diagnosis not present

## 2020-02-27 DIAGNOSIS — R1310 Dysphagia, unspecified: Secondary | ICD-10-CM | POA: Diagnosis not present

## 2020-02-27 DIAGNOSIS — K219 Gastro-esophageal reflux disease without esophagitis: Secondary | ICD-10-CM | POA: Diagnosis not present

## 2020-02-27 DIAGNOSIS — I2510 Atherosclerotic heart disease of native coronary artery without angina pectoris: Secondary | ICD-10-CM | POA: Diagnosis not present

## 2020-02-27 DIAGNOSIS — J309 Allergic rhinitis, unspecified: Secondary | ICD-10-CM | POA: Diagnosis not present

## 2020-02-27 DIAGNOSIS — M150 Primary generalized (osteo)arthritis: Secondary | ICD-10-CM | POA: Diagnosis not present

## 2020-02-27 DIAGNOSIS — E538 Deficiency of other specified B group vitamins: Secondary | ICD-10-CM | POA: Diagnosis not present

## 2020-02-27 DIAGNOSIS — K8040 Calculus of bile duct with cholecystitis, unspecified, without obstruction: Secondary | ICD-10-CM | POA: Diagnosis not present

## 2020-02-27 DIAGNOSIS — I700 Atherosclerosis of aorta: Secondary | ICD-10-CM | POA: Diagnosis not present

## 2020-03-11 ENCOUNTER — Ambulatory Visit: Payer: PPO

## 2020-03-15 ENCOUNTER — Ambulatory Visit (INDEPENDENT_AMBULATORY_CARE_PROVIDER_SITE_OTHER): Payer: PPO

## 2020-03-15 ENCOUNTER — Other Ambulatory Visit: Payer: Self-pay

## 2020-03-15 ENCOUNTER — Ambulatory Visit: Payer: PPO

## 2020-03-15 DIAGNOSIS — E538 Deficiency of other specified B group vitamins: Secondary | ICD-10-CM

## 2020-03-15 NOTE — Progress Notes (Signed)
B12 injection given and tolerated well.  

## 2020-03-27 ENCOUNTER — Ambulatory Visit: Payer: PPO

## 2020-04-15 ENCOUNTER — Ambulatory Visit: Payer: PPO

## 2020-04-19 ENCOUNTER — Other Ambulatory Visit: Payer: Self-pay

## 2020-04-19 ENCOUNTER — Ambulatory Visit (INDEPENDENT_AMBULATORY_CARE_PROVIDER_SITE_OTHER): Payer: PPO | Admitting: Nurse Practitioner

## 2020-04-19 ENCOUNTER — Ambulatory Visit: Payer: PPO

## 2020-04-19 ENCOUNTER — Encounter: Payer: Self-pay | Admitting: Nurse Practitioner

## 2020-04-19 VITALS — BP 164/75 | HR 91 | Temp 97.8°F | Resp 20 | Ht 67.0 in | Wt 134.0 lb

## 2020-04-19 DIAGNOSIS — K219 Gastro-esophageal reflux disease without esophagitis: Secondary | ICD-10-CM

## 2020-04-19 DIAGNOSIS — M549 Dorsalgia, unspecified: Secondary | ICD-10-CM | POA: Diagnosis not present

## 2020-04-19 DIAGNOSIS — E785 Hyperlipidemia, unspecified: Secondary | ICD-10-CM | POA: Diagnosis not present

## 2020-04-19 DIAGNOSIS — M8949 Other hypertrophic osteoarthropathy, multiple sites: Secondary | ICD-10-CM

## 2020-04-19 DIAGNOSIS — E538 Deficiency of other specified B group vitamins: Secondary | ICD-10-CM | POA: Diagnosis not present

## 2020-04-19 DIAGNOSIS — R413 Other amnesia: Secondary | ICD-10-CM | POA: Diagnosis not present

## 2020-04-19 DIAGNOSIS — F5101 Primary insomnia: Secondary | ICD-10-CM

## 2020-04-19 DIAGNOSIS — M159 Polyosteoarthritis, unspecified: Secondary | ICD-10-CM

## 2020-04-19 DIAGNOSIS — R1310 Dysphagia, unspecified: Secondary | ICD-10-CM | POA: Diagnosis not present

## 2020-04-19 DIAGNOSIS — I700 Atherosclerosis of aorta: Secondary | ICD-10-CM | POA: Diagnosis not present

## 2020-04-19 DIAGNOSIS — E559 Vitamin D deficiency, unspecified: Secondary | ICD-10-CM

## 2020-04-19 DIAGNOSIS — J449 Chronic obstructive pulmonary disease, unspecified: Secondary | ICD-10-CM | POA: Diagnosis not present

## 2020-04-19 DIAGNOSIS — N400 Enlarged prostate without lower urinary tract symptoms: Secondary | ICD-10-CM

## 2020-04-19 MED ORDER — FINASTERIDE 5 MG PO TABS: 5.0000 mg | ORAL_TABLET | Freq: Every day | ORAL | 1 refills | Status: DC

## 2020-04-19 MED ORDER — DONEPEZIL HCL 5 MG PO TABS: 5.0000 mg | ORAL_TABLET | Freq: Every day | ORAL | 5 refills | Status: DC

## 2020-04-19 MED ORDER — PROAIR HFA 108 (90 BASE) MCG/ACT IN AERS: INHALATION_SPRAY | RESPIRATORY_TRACT | 11 refills | Status: DC

## 2020-04-19 MED ORDER — MEMANTINE HCL 5 MG PO TABS: 5.0000 mg | ORAL_TABLET | Freq: Three times a day (TID) | ORAL | 5 refills | Status: DC

## 2020-04-19 MED ORDER — PANTOPRAZOLE SODIUM 40 MG PO TBEC: 40.0000 mg | DELAYED_RELEASE_TABLET | Freq: Every day | ORAL | 5 refills | Status: DC

## 2020-04-19 MED ORDER — TRELEGY ELLIPTA 100-62.5-25 MCG/INH IN AEPB: 1.0000 | INHALATION_SPRAY | Freq: Every day | RESPIRATORY_TRACT | 11 refills | Status: DC

## 2020-04-19 MED ORDER — TERAZOSIN HCL 5 MG PO CAPS: ORAL_CAPSULE | ORAL | 1 refills | Status: DC

## 2020-04-19 NOTE — Patient Instructions (Signed)
Fall Prevention in the Home, Adult Falls can cause injuries and can happen to people of all ages. There are many things you can do to make your home safe and to help prevent falls. Ask for help when making these changes. What actions can I take to prevent falls? General Instructions  Use good lighting in all rooms. Replace any light bulbs that burn out.  Turn on the lights in dark areas. Use night-lights.  Keep items that you use often in easy-to-reach places. Lower the shelves around your home if needed.  Set up your furniture so you have a clear path. Avoid moving your furniture around.  Do not have throw rugs or other things on the floor that can make you trip.  Avoid walking on wet floors.  If any of your floors are uneven, fix them.  Add color or contrast paint or tape to clearly mark and help you see: ? Grab bars or handrails. ? First and last steps of staircases. ? Where the edge of each step is.  If you use a stepladder: ? Make sure that it is fully opened. Do not climb a closed stepladder. ? Make sure the sides of the stepladder are locked in place. ? Ask someone to hold the stepladder while you use it.  Know where your pets are when moving through your home. What can I do in the bathroom?  Keep the floor dry. Clean up any water on the floor right away.  Remove soap buildup in the tub or shower.  Use nonskid mats or decals on the floor of the tub or shower.  Attach bath mats securely with double-sided, nonslip rug tape.  If you need to sit down in the shower, use a plastic, nonslip stool.  Install grab bars by the toilet and in the tub and shower. Do not use towel bars as grab bars.      What can I do in the bedroom?  Make sure that you have a light by your bed that is easy to reach.  Do not use any sheets or blankets for your bed that hang to the floor.  Have a firm chair with side arms that you can use for support when you get dressed. What can I do in  the kitchen?  Clean up any spills right away.  If you need to reach something above you, use a step stool with a grab bar.  Keep electrical cords out of the way.  Do not use floor polish or wax that makes floors slippery. What can I do with my stairs?  Do not leave any items on the stairs.  Make sure that you have a light switch at the top and the bottom of the stairs.  Make sure that there are handrails on both sides of the stairs. Fix handrails that are broken or loose.  Install nonslip stair treads on all your stairs.  Avoid having throw rugs at the top or bottom of the stairs.  Choose a carpet that does not hide the edge of the steps on the stairs.  Check carpeting to make sure that it is firmly attached to the stairs. Fix carpet that is loose or worn. What can I do on the outside of my home?  Use bright outdoor lighting.  Fix the edges of walkways and driveways and fix any cracks.  Remove anything that might make you trip as you walk through a door, such as a raised step or threshold.  Trim any   bushes or trees on paths to your home.  Check to see if handrails are loose or broken and that both sides of all steps have handrails.  Install guardrails along the edges of any raised decks and porches.  Clear paths of anything that can make you trip, such as tools or rocks.  Have leaves, snow, or ice cleared regularly.  Use sand or salt on paths during winter.  Clean up any spills in your garage right away. This includes grease or oil spills. What other actions can I take?  Wear shoes that: ? Have a low heel. Do not wear high heels. ? Have rubber bottoms. ? Feel good on your feet and fit well. ? Are closed at the toe. Do not wear open-toe sandals.  Use tools that help you move around if needed. These include: ? Canes. ? Walkers. ? Scooters. ? Crutches.  Review your medicines with your doctor. Some medicines can make you feel dizzy. This can increase your chance  of falling. Ask your doctor what else you can do to help prevent falls. Where to find more information  Centers for Disease Control and Prevention, [employer]: www.cdc.gov  National Institute on Aging: www.nia.nih.gov Contact a doctor if:  You are afraid of falling at home.  You feel weak, drowsy, or dizzy at home.  You fall at home. Summary  There are many simple things that you can do to make your home safe and to help prevent falls.  Ways to make your home safe include removing things that can make you trip and installing grab bars in the bathroom.  Ask for help when making these changes in your home. This information is not intended to replace advice given to you by your health care provider. Make sure you discuss any questions you have with your health care provider. Document Revised: 09/27/2019 Document Reviewed: 09/27/2019 Elsevier Patient Education  2021 [employer]  

## 2020-04-19 NOTE — Progress Notes (Signed)
Subjective:    Patient ID: Carlos Brooks, male    DOB: December 30, 1926, 87 y.o.   MRN: [id number]   Chief Complaint: medical management of chronic issues     HPI:  1. Aortic atherosclerosis ([place]) Has not seen cardiology in awhile according to chart  2. Thoracic aorta atherosclerosis ([place])  3. COPD (chronic obstructive pulmonary disease) with chronic bronchitis ([place]) Has some SOB. Uses trelogy daily which is very helpful.  4. Gastroesophageal reflux disease without esophagitis Is on protonix daily and works well to prevent symptoms.  5. Primary osteoarthritis involving multiple joints Has lots of joint and back pain. Just takes tylenol.  6. Benign prostatic hyperplasia without lower urinary tract symptoms No problems voiding  7. Hyperlipidemia LDL goal <100 Doe snot really watch diet and does little to no exercise. Lab Results  Component Value Date   CHOL 121 10/16/2019   HDL 51 10/16/2019   LDLCALC 34 10/16/2019   TRIG 235 (H) 10/16/2019   CHOLHDL 2.4 10/16/2019     8. Difficulty swallowing solids Occurs sometimes but if he chews his food well it does not happen  9. Back pain, unspecified back location, unspecified back pain laterality, unspecified chronicity Hurts all the time. Says iyt is not bad. Tolerabel. Takes tylenol when he needs it.  10. Primary insomnia Sleeps about 7-8 hours a night.  11. Memory loss Is on namenda and aricept. memory is holding staedy  12. Vitamin D deficiency Is on daily vitamin d supplement    Outpatient Encounter Medications as of 04/19/2020  Medication Sig  . aspirin 81 MG EC tablet Take 1 tablet (81 mg total) by mouth daily.  Marland Kitchen donepezil (ARICEPT) 5 MG tablet Take 1 tablet (5 mg total) by mouth at bedtime.  . finasteride (PROSCAR) 5 MG tablet Take 1 tablet (5 mg total) by mouth daily.  . fluticasone (FLONASE) 50 MCG/ACT nasal spray Place 2 sprays into both nostrils daily.  . Fluticasone-Umeclidin-Vilant (TRELEGY ELLIPTA)  100-62.5-25 MCG/INH AEPB Inhale 1 puff into the lungs daily.  Marland Kitchen guaiFENesin (MUCINEX) 600 MG 12 hr tablet Take 2 tablets (1,200 mg total) by mouth 2 (two) times daily as needed.  Marland Kitchen levocetirizine (XYZAL) 5 MG tablet Take 0.5 tablets (2.5 mg total) by mouth every evening.  Marland Kitchen LORazepam (ATIVAN) 0.5 MG tablet Take nightly as needed for sleep  . memantine (NAMENDA) 5 MG tablet Take 1 tablet (5 mg total) by mouth in the morning, at noon, and at bedtime.  Marland Kitchen OVER THE COUNTER MEDICATION Vit b 12 1000 IM Q month  . pantoprazole (PROTONIX) 40 MG tablet Take 1 tablet (40 mg total) by mouth daily.  Marland Kitchen PROAIR HFA 108 (90 Base) MCG/ACT inhaler 2 PUFFS EVERY 6 HOURS AS NEEDED FOR WHEEZING OR SHORTNESS OF BREATH (Patient taking differently: Inhale 1-2 puffs into the lungs every 6 (six) hours as needed for wheezing or shortness of breath.)  . terazosin (HYTRIN) 5 MG capsule TAKE (1) CAPSULE DAILY  . ursodiol (ACTIGALL) 300 MG capsule Take 1 capsule (300 mg total) by mouth 3 (three) times daily.   Facility-Administered Encounter Medications as of 04/19/2020  Medication  . cyanocobalamin ((VITAMIN B-12)) injection 1,000 mcg    Past Surgical History:  Procedure Laterality Date  . EYE SURGERY    . HERNIA REPAIR     x 3, inguinal   . skin cancer removal     head    Family History  Problem Relation Age of Onset  . Cancer Mother  ovairian  . Cancer Father        lung  . Heart attack Brother 18       Not clear details  . Heart attack Son 73       Died with an MI  . Heart disease Son   . Cancer Brother        liver  . Heart attack Brother 26  . Aneurysm Brother   . Hypertension Son   . Obesity Son   . Asthma Son     New complaints: None today  Social history: Lives with his wife. Their daughters check on them daily  Controlled substance contract: n/a    Review of Systems  Constitutional: Negative for diaphoresis.  Eyes: Negative for pain.  Respiratory: Negative for shortness of  breath.   Cardiovascular: Negative for chest pain, palpitations and leg swelling.  Gastrointestinal: Negative for abdominal pain.  Endocrine: Negative for polydipsia.  Skin: Negative for rash.  Neurological: Negative for dizziness, weakness and headaches.  Hematological: Does not bruise/bleed easily.  All other systems reviewed and are negative.      Objective:   Physical Exam Vitals and nursing note reviewed.  Constitutional:      Appearance: Normal appearance. He is well-developed and well-nourished.  HENT:     Head: Normocephalic.     Nose: Nose normal.     Mouth/Throat:     Mouth: Oropharynx is clear and moist.  Eyes:     Extraocular Movements: EOM normal.     Pupils: Pupils are equal, round, and reactive to light.  Neck:     Thyroid: No thyroid mass or thyromegaly.     Vascular: No carotid bruit or JVD.     Trachea: Phonation normal.  Cardiovascular:     Rate and Rhythm: Normal rate and regular rhythm.  Pulmonary:     Effort: Pulmonary effort is normal. No respiratory distress.     Breath sounds: Normal breath sounds.  Abdominal:     General: Bowel sounds are normal. Aorta is normal.     Palpations: Abdomen is soft.     Tenderness: There is no abdominal tenderness.  Musculoskeletal:        General: Normal range of motion.     Cervical back: Normal range of motion and neck supple.  Lymphadenopathy:     Cervical: No cervical adenopathy.  Skin:    General: Skin is warm and dry.  Neurological:     Mental Status: He is alert and oriented to person, place, and time.  Psychiatric:        Mood and Affect: Mood and affect normal.        Behavior: Behavior normal.        Thought Content: Thought content normal.        Judgment: Judgment normal.    BP (!) 164/75   Pulse 91   Temp 97.8 F (36.6 C) (Temporal)   Resp 20   Ht _0  (1.702 m)   Wt 134 lb (60.8 kg)   SpO2 90%   BMI 20.99 kg/m         Assessment & Plan:  Carlos Brooks comes in today with  chief complaint of Medical Management of Chronic Issues   Diagnosis and orders addressed:  1. Aortic atherosclerosis ([facility]) - CBC with Differential/Platelet - CMP14+EGFR  2. Thoracic aorta atherosclerosis ([place])  3. COPD (chronic obstructive pulmonary disease) with chronic bronchitis ([facility]) Continue inhalers - Fluticasone-Umeclidin-Vilant (TRELEGY ELLIPTA) 100-62.5-25 MCG/INH AEPB; Inhale 1 puff  into the lungs daily.  Dispense: 28 each; Refill: 11 - PROAIR HFA 108 (90 Base) MCG/ACT inhaler; 2 PUFFS EVERY 6 HOURS AS NEEDED FOR WHEEZING OR SHORTNESS OF BREATH  Dispense: 8.5 g; Refill: 11 - Amb Referral to Palliative Care  4. Gastroesophageal reflux disease without esophagitis Avoid spicy foods Do not eat 2 hours prior to bedtime - pantoprazole (PROTONIX) 40 MG tablet; Take 1 tablet (40 mg total) by mouth daily.  Dispense: 30 tablet; Refill: 5  5. Primary osteoarthritis involving multiple joints Fall prevention  6. Benign prostatic hyperplasia without lower urinary tract symptoms - finasteride (PROSCAR) 5 MG tablet; Take 1 tablet (5 mg total) by mouth daily.  Dispense: 90 tablet; Refill: 1 - terazosin (HYTRIN) 5 MG capsule; TAKE (1) CAPSULE DAILY  Dispense: 90 capsule; Refill: 1  7. Hyperlipidemia LDL goal <100 Low fat diet - Lipid panel  8. Difficulty swallowing solids Chew food well  9. Back pain, unspecified back location, unspecified back pain laterality, unspecified chronicity Tylenol as needed  10. Primary insomnia Bedtime routine  11. Memory loss - donepezil (ARICEPT) 5 MG tablet; Take 1 tablet (5 mg total) by mouth at bedtime.  Dispense: 30 tablet; Refill: 5 - memantine (NAMENDA) 5 MG tablet; Take 1 tablet (5 mg total) by mouth in the morning, at noon, and at bedtime.  Dispense: 90 tablet; Refill: 5  12. Vitamin D deficiency continue daily vitamin d   Labs pending Health Maintenance reviewed Diet and exercise encouraged  Follow up plan: 3  months   Mary-Margaret Hassell Done, FNP

## 2020-05-01 DIAGNOSIS — I700 Atherosclerosis of aorta: Secondary | ICD-10-CM | POA: Diagnosis not present

## 2020-05-01 DIAGNOSIS — J449 Chronic obstructive pulmonary disease, unspecified: Secondary | ICD-10-CM | POA: Diagnosis not present

## 2020-05-01 DIAGNOSIS — Z515 Encounter for palliative care: Secondary | ICD-10-CM | POA: Diagnosis not present

## 2020-05-01 DIAGNOSIS — G3184 Mild cognitive impairment, so stated: Secondary | ICD-10-CM | POA: Diagnosis not present

## 2020-05-14 ENCOUNTER — Ambulatory Visit (INDEPENDENT_AMBULATORY_CARE_PROVIDER_SITE_OTHER): Payer: PPO

## 2020-05-14 ENCOUNTER — Other Ambulatory Visit: Payer: Self-pay

## 2020-05-14 DIAGNOSIS — E538 Deficiency of other specified B group vitamins: Secondary | ICD-10-CM

## 2020-05-14 NOTE — Progress Notes (Signed)
B12 injection given in left deltoid. Pt tolerated well. Next months appt scheduled for 06/14/20 at 9:30am

## 2020-05-23 ENCOUNTER — Ambulatory Visit (INDEPENDENT_AMBULATORY_CARE_PROVIDER_SITE_OTHER): Payer: PPO | Admitting: *Deleted

## 2020-05-23 DIAGNOSIS — Z0000 Encounter for general adult medical examination without abnormal findings: Secondary | ICD-10-CM | POA: Diagnosis not present

## 2020-05-23 NOTE — Patient Instructions (Signed)
  [place] Maintenance Summary and Written Plan of Care  Mr. Marcoux ,  Thank you for allowing me to perform your Medicare Annual Wellness Visit and for your ongoing commitment to your health.   Health Maintenance & Immunization History Health Maintenance  Topic Date Due  . TETANUS/TDAP  11/06/2024  . INFLUENZA VACCINE  Completed  . COVID-19 Vaccine  Completed  . HPV VACCINES  Aged Out  . PNA vac Low Risk Adult  Discontinued   Immunization History  Administered Date(s) Administered  . Fluad Quad(high Dose 65+) 11/25/2018, 12/11/2019  . Influenza Split 12/08/2010, 12/08/2011  . Influenza Whole 12/26/2008  . Influenza, High Dose Seasonal PF 12/13/2015, 12/15/2016, 01/12/2018  . Influenza,inj,Quad PF,6+ Mos 12/26/2012, 12/21/2014  . Influenza,inj,quad, With Preservative 12/05/2013  . Moderna Sars-Covid-2 Vaccination 03/21/2019, 05/01/2019, 02/22/2020  . Pneumococcal Polysaccharide-23 03/09/1994  . Td 03/09/2005, 11/07/2014  . Zoster 01/08/2007    These are the patient goals that we discussed: Goals Addressed            This Visit's Progress   . Patient Stated       05/23/2020 AWV Goal: Exercise for General Health   Patient will verbalize understanding of the benefits of increased physical activity:  Exercising regularly is important. It will improve your overall fitness, flexibility, and endurance.  Regular exercise also will improve your overall health. It can help you control your weight, reduce stress, and improve your bone density.  Over the next year, patient will increase physical activity as tolerated with a goal of at least 150 minutes of moderate physical activity per week.   You can tell that you are exercising at a moderate intensity if your heart starts beating faster and you start breathing faster but can still hold a conversation.  Moderate-intensity exercise ideas include:  Walking 1 mile (1.6 km) in about 15  minutes  Biking  Hiking  Golfing  Dancing  Water aerobics  Patient will verbalize understanding of everyday activities that increase physical activity by providing examples like the following: ? Yard work, such as: ? Pushing a Conservation officer, nature ? Raking and bagging leaves ? Washing your car ? Pushing a stroller ? Shoveling snow ? Gardening ? Washing windows or floors  Patient will be able to explain general safety guidelines for exercising:   Before you start a new exercise program, talk with your health care provider.  Do not exercise so much that you hurt yourself, feel dizzy, or get very short of breath.  Wear comfortable clothes and wear shoes with good support.  Drink plenty of water while you exercise to prevent dehydration or heat stroke.  Work out until your breathing and your heartbeat get faster.         This is a list of Health Maintenance Items that are overdue or due now: There are no preventive care reminders to display for this patient.   Orders/Referrals Placed Today: No orders of the defined types were placed in this encounter.  (Contact our referral department at [phone] if you have not spoken with someone about your referral appointment within the next 5 days)    Follow-up Plan Follow-up with [name], FNP as planned

## 2020-05-23 NOTE — Progress Notes (Addendum)
MEDICARE ANNUAL WELLNESS VISIT  05/23/2020  Telephone Visit Disclaimer This Medicare AWV was conducted by telephone due to national recommendations for restrictions regarding the COVID-19 Pandemic (e.g. social distancing).  I verified, using two identifiers, that I am speaking with Carlos Brooks or their authorized healthcare agent. I discussed the limitations, risks, security, and privacy concerns of performing an evaluation and management service by telephone and the potential availability of an in-person appointment in the future. The patient expressed understanding and agreed to proceed.  Location of Patient: Home Location of Provider (nurse):  [facility]  Subjective:    Carlos Brooks is a 87 y.o. male patient of Carlos Brooks, Carlos Brooks who had a Medicare Annual Wellness Visit today via telephone. Carlos Brooks is Retired and lives with his wife Carlos Brooks. he has 4 living children and 1 son who is passed away. he reports that he is socially active and does interact with friends/family regularly. he is not physically active and enjoys woodworking.  Patient Care Team: Carlos Pretty, FNP as PCP - General (Family Medicine) [name], MD as Consulting Physician (Optometry) Hayden Pedro, MD as Consulting Physician (Ophthalmology) Jarome Matin, MD as Consulting Physician (Dermatology) Minus Breeding, MD as Consulting Physician (Cardiology)  Advanced Directives 05/23/2020 01/02/2020 05/23/2019 06/03/2018 01/05/2018 10/13/2017 10/24/2015  Does Patient Have a Medical Advance Directive? No No Yes Yes No Yes No  Type of Advance Directive - - Living will [place];Living will - [place];Living will -  Does patient want to make changes to medical advance directive? - - No - Patient declined No - Patient declined - No - Patient declined -  Copy of [place] in Chart? - - - No - copy requested - No -  copy requested -  Would patient like information on creating a medical advance directive? No - Patient declined No - Patient declined - - No - Patient declined - No - patient declined information    Hospital Utilization Over the Past 12 Months: # of hospitalizations or ER visits: 1 # of surgeries: 0  Review of Systems    Patient reports that his overall health is unchanged compared to last year.  History obtained from chart review and the patient  Patient Reported Readings (BP, Pulse, CBG, Weight, etc) none  Pain Assessment Pain : No/denies pain     Current Medications & Allergies (verified) Allergies as of 05/23/2020      Reactions   Clarithromycin    Crestor [rosuvastatin Calcium]    Lipitor [atorvastatin Calcium]    Niaspan [niacin Er]    Pneumovax [pneumococcal Polysaccharide Vaccine]    Arm swelling and rash   Pravachol    Ranitidine Hcl    Simvastatin       Medication List       Accurate as of May 23, 2020  3:08 PM. If you have any questions, ask your nurse or doctor.        aspirin 81 MG EC tablet Take 1 tablet (81 mg total) by mouth daily.   donepezil 5 MG tablet Commonly known as: Aricept Take 1 tablet (5 mg total) by mouth at bedtime.   finasteride 5 MG tablet Commonly known as: PROSCAR Take 1 tablet (5 mg total) by mouth daily.   fluticasone 50 MCG/ACT nasal spray Commonly known as: FLONASE Place 2 sprays into both nostrils daily.   guaiFENesin 600 MG 12 hr tablet Commonly known as: MUCINEX Take  2 tablets (1,200 mg total) by mouth 2 (two) times daily as needed.   levocetirizine 5 MG tablet Commonly known as: Xyzal Take 0.5 tablets (2.5 mg total) by mouth every evening.   LORazepam 0.5 MG tablet Commonly known as: ATIVAN Take nightly as needed for sleep   memantine 5 MG tablet Commonly known as: Namenda Take 1 tablet (5 mg total) by mouth in the morning, at noon, and at bedtime.   OVER THE COUNTER MEDICATION Vit b 12 1000 IM Q  month   pantoprazole 40 MG tablet Commonly known as: PROTONIX Take 1 tablet (40 mg total) by mouth daily.   ProAir HFA 108 (90 Base) MCG/ACT inhaler Generic drug: albuterol 2 PUFFS EVERY 6 HOURS AS NEEDED FOR WHEEZING OR SHORTNESS OF BREATH   terazosin 5 MG capsule Commonly known as: HYTRIN TAKE (1) CAPSULE DAILY   Trelegy Ellipta 100-62.5-25 MCG/INH Aepb Generic drug: Fluticasone-Umeclidin-Vilant Inhale 1 puff into the lungs daily.   ursodiol 300 MG capsule Commonly known as: ACTIGALL Take 1 capsule (300 mg total) by mouth 3 (three) times daily.       History (reviewed): Past Medical History:  Diagnosis Date  . BPH (benign prostatic hyperplasia)   . Cancer ([place])    skin  . Cataract   . COPD (chronic obstructive pulmonary disease) ([place])   . Generalized headaches   . GERD (gastroesophageal reflux disease)   . Hiatal hernia   . Hyperlipidemia    Past Surgical History:  Procedure Laterality Date  . EYE SURGERY    . HERNIA REPAIR     x 3, inguinal   . skin cancer removal     head   Family History  Problem Relation Age of Onset  . Cancer Mother        [name]  . Cancer Father        lung  . Heart attack Brother 52       Not clear details  . Heart attack Son 12       Died with an MI  . Heart disease Son   . Cancer Brother        liver  . Heart attack Brother 84  . Aneurysm Brother   . Hypertension Son   . Obesity Son   . Asthma Son    Social History   Socioeconomic History  . Marital status: Married    Spouse name: Not on file  . Number of children: 5  . Years of education: Not on file  . Highest education level: 8th grade  Occupational History  . Occupation: Retired  Tobacco Use  . Smoking status: Former Smoker    Quit date: 04/13/1993    Years since quitting: 27.1  . Smokeless tobacco: Never Used  Vaping Use  . Vaping Use: Never used  Substance and Sexual Activity  . Alcohol use: No  . Drug use: No  . Sexual activity: Not Currently   Other Topics Concern  . Not on file  Social History Narrative  . Not on file   Social Determinants of Health   Financial Resource Strain: Not on file  Food Insecurity: Not on file  Transportation Needs: Not on file  Physical Activity: Not on file  Stress: Not on file  Social Connections: Not on file    Activities of Daily Living In your present state of health, do you have any difficulty performing the following activities: 05/23/2020 01/02/2020  Hearing? N N  Vision? N N  Difficulty concentrating or  making decisions? N N  Walking or climbing stairs? N N  Dressing or bathing? N N  Doing errands, shopping? Y N  Comment Patient is not driving anymore -  Preparing Food and eating ? N -  Using the Toilet? N -  In the past six months, have you accidently leaked urine? N -  Do you have problems with loss of bowel control? N -  Managing your Medications? N -  Managing your Finances? N -  Housekeeping or managing your Housekeeping? N -  Some recent data might be hidden    Patient Education/ Literacy How often do you need to have someone help you when you read instructions, pamphlets, or other written materials from your doctor or pharmacy?: 1 - Never  Exercise Current Exercise Habits: The patient does not participate in regular exercise at present, Exercise limited by: None identified  Diet Patient reports consuming 3 meals a day and 0 snack(s) a day Patient reports that his primary diet is: Regular Patient reports that she does have regular access to food.   Depression Screen PHQ 2/9 Scores 05/23/2020 04/19/2020 02/08/2020 01/11/2020 12/11/2019 10/16/2019 06/15/2019  PHQ - 2 Score 0 0 0 0 0 0 0  PHQ- 9 Score - - - - - - -     Fall Risk Fall Risk  05/23/2020 04/19/2020 02/08/2020 01/16/2020 01/11/2020  Falls in the past year? 0 0 0 0 0  Follow up - - - Falls evaluation completed -     Objective:  Carlos Brooks seemed alert and oriented and he participated appropriately during  our telephone visit.  Blood Pressure Weight BMI  BP Readings from Last 3 Encounters:  04/19/20 (!) 164/75  02/15/20 (!) 141/65  02/08/20 132/62   Wt Readings from Last 3 Encounters:  04/19/20 134 lb (60.8 kg)  02/15/20 132 lb (59.9 kg)  02/08/20 133 lb (60.3 kg)   BMI Readings from Last 1 Encounters:  04/19/20 20.99 kg/m    *Unable to obtain current vital signs, weight, and BMI due to telephone visit type  Hearing/Vision  . [place] did not seem to have difficulty with hearing/understanding during the telephone conversation . Reports that he has had a formal eye exam by an eye care professional within the past year . Reports that he has not had a formal hearing evaluation within the past year *Unable to fully assess hearing and vision during telephone visit type  Cognitive Function: 6CIT Screen 05/23/2020 05/23/2019  What Year? 0 points 4 points  What month? 0 points 0 points  What time? 0 points 0 points  Count Brooks from 20 0 points 0 points  Months in reverse 4 points 4 points  Repeat phrase 10 points 0 points  Total Score 14 8   (Normal:0-7, Significant for Dysfunction: >8)  Normal Cognitive Function Screening: No   Immunization & Health Maintenance Record Immunization History  Administered Date(s) Administered  . Fluad Quad(high Dose 65+) 11/25/2018, 12/11/2019  . Influenza Split 12/08/2010, 12/08/2011  . Influenza Whole 12/26/2008  . Influenza, High Dose Seasonal PF 12/13/2015, 12/15/2016, 01/12/2018  . Influenza,inj,Quad PF,6+ Mos 12/26/2012, 12/21/2014  . Influenza,inj,quad, With Preservative 12/05/2013  . Moderna Sars-Covid-2 Vaccination 03/21/2019, 05/01/2019, 02/22/2020  . Pneumococcal Polysaccharide-23 03/09/1994  . Td 03/09/2005, 11/07/2014  . Zoster 01/08/2007    Health Maintenance  Topic Date Due  . Samul Dada  11/06/2024  . INFLUENZA VACCINE  Completed  . COVID-19 Vaccine  Completed  . HPV VACCINES  Aged Out  . PNA vac  Low Risk Adult   Discontinued       Assessment  This is a routine wellness examination for HERCHEL HOPKIN.  Health Maintenance: Due or Overdue There are no preventive care reminders to display for this patient.  Carlos Brooks does not need a referral for Community Assistance: Care Management:   no Social Work:    no Prescription Assistance:  no Nutrition/Diabetes Education:  no   Plan:  Personalized Goals Goals Addressed            This Visit's Progress   . Patient Stated       05/23/2020 AWV Goal: Exercise for General Health   Patient will verbalize understanding of the benefits of increased physical activity:  Exercising regularly is important. It will improve your overall fitness, flexibility, and endurance.  Regular exercise also will improve your overall health. It can help you control your weight, reduce stress, and improve your bone density.  Over the next year, patient will increase physical activity as tolerated with a goal of at least 150 minutes of moderate physical activity per week.   You can tell that you are exercising at a moderate intensity if your heart starts beating faster and you start breathing faster but can still hold a conversation.  Moderate-intensity exercise ideas include:  Walking 1 mile (1.6 km) in about 15 minutes  Biking  Hiking  Golfing  Dancing  Water aerobics  Patient will verbalize understanding of everyday activities that increase physical activity by providing examples like the following: ? Yard work, such as: ? Pushing a Conservation officer, nature ? Raking and bagging leaves ? Washing your car ? Pushing a stroller ? Shoveling snow ? Gardening ? Washing windows or floors  Patient will be able to explain general safety guidelines for exercising:   Before you start a new exercise program, talk with your health care provider.  Do not exercise so much that you hurt yourself, feel dizzy, or get very short of breath.  Wear comfortable clothes  and wear shoes with good support.  Drink plenty of water while you exercise to prevent dehydration or heat stroke.  Work out until your breathing and your heartbeat get faster.       Personalized Health Maintenance & Screening Recommendations  Patient is up to date at this time  Lung Cancer Screening Recommended: no (Low Dose CT Chest recommended if Age 28-80 years, 30 pack-year currently smoking OR have quit w/in past 15 years) Hepatitis C Screening recommended: not applicable HIV Screening recommended: no  Advanced Directives: Written information was not prepared per patient's request.  Referrals & Orders No orders of the defined types were placed in this encounter.   Follow-up Plan . Follow-up with Carlos Pretty, FNP as planned . AVS printed and mailed to patient    I have personally reviewed and noted the following in the patient's chart:   . Medical and social history . Use of alcohol, tobacco or illicit drugs  . Current medications and supplements . Functional ability and status . Nutritional status . Physical activity . Advanced directives . List of other physicians . Hospitalizations, surgeries, and ER visits in previous 12 months . Vitals . Screenings to include cognitive, depression, and falls . Referrals and appointments  In addition, I have reviewed and discussed with Carlos Brooks certain preventive protocols, quality metrics, and best practice recommendations. A written personalized care plan for preventive services as well as general preventive health recommendations is available and can be mailed to  the patient at his request.      Lynnea Ferrier, LPN  [id number]

## 2020-06-06 DIAGNOSIS — J449 Chronic obstructive pulmonary disease, unspecified: Secondary | ICD-10-CM | POA: Diagnosis not present

## 2020-06-06 DIAGNOSIS — Z515 Encounter for palliative care: Secondary | ICD-10-CM | POA: Diagnosis not present

## 2020-06-06 DIAGNOSIS — G3184 Mild cognitive impairment, so stated: Secondary | ICD-10-CM | POA: Diagnosis not present

## 2020-06-06 DIAGNOSIS — I700 Atherosclerosis of aorta: Secondary | ICD-10-CM | POA: Diagnosis not present

## 2020-06-14 ENCOUNTER — Ambulatory Visit (INDEPENDENT_AMBULATORY_CARE_PROVIDER_SITE_OTHER): Payer: PPO | Admitting: *Deleted

## 2020-06-14 ENCOUNTER — Other Ambulatory Visit: Payer: Self-pay

## 2020-06-14 DIAGNOSIS — E538 Deficiency of other specified B group vitamins: Secondary | ICD-10-CM | POA: Diagnosis not present

## 2020-06-14 NOTE — Progress Notes (Signed)
Pt tolerated B12 well 

## 2020-06-17 ENCOUNTER — Ambulatory Visit (INDEPENDENT_AMBULATORY_CARE_PROVIDER_SITE_OTHER): Payer: PPO | Admitting: Gastroenterology

## 2020-06-20 ENCOUNTER — Telehealth: Payer: Self-pay

## 2020-06-24 ENCOUNTER — Encounter: Payer: Self-pay | Admitting: Family Medicine

## 2020-06-24 NOTE — Telephone Encounter (Signed)
Letter on your desk to sign

## 2020-06-24 NOTE — Telephone Encounter (Signed)
OK to write letter? 

## 2020-07-04 DIAGNOSIS — J449 Chronic obstructive pulmonary disease, unspecified: Secondary | ICD-10-CM | POA: Diagnosis not present

## 2020-07-04 DIAGNOSIS — Z515 Encounter for palliative care: Secondary | ICD-10-CM | POA: Diagnosis not present

## 2020-07-04 DIAGNOSIS — I700 Atherosclerosis of aorta: Secondary | ICD-10-CM | POA: Diagnosis not present

## 2020-07-04 DIAGNOSIS — G3184 Mild cognitive impairment, so stated: Secondary | ICD-10-CM | POA: Diagnosis not present

## 2020-07-10 DIAGNOSIS — Z85828 Personal history of other malignant neoplasm of skin: Secondary | ICD-10-CM | POA: Diagnosis not present

## 2020-07-10 DIAGNOSIS — L570 Actinic keratosis: Secondary | ICD-10-CM | POA: Diagnosis not present

## 2020-07-15 ENCOUNTER — Ambulatory Visit (INDEPENDENT_AMBULATORY_CARE_PROVIDER_SITE_OTHER): Payer: PPO

## 2020-07-15 ENCOUNTER — Other Ambulatory Visit: Payer: Self-pay

## 2020-07-15 DIAGNOSIS — E538 Deficiency of other specified B group vitamins: Secondary | ICD-10-CM

## 2020-07-15 NOTE — Progress Notes (Signed)
Cyanocobalamin injection given to left deltoid.  Patient tolerated well. 

## 2020-08-09 ENCOUNTER — Other Ambulatory Visit: Payer: Self-pay

## 2020-08-09 ENCOUNTER — Encounter: Payer: Self-pay | Admitting: Nurse Practitioner

## 2020-08-09 ENCOUNTER — Ambulatory Visit (INDEPENDENT_AMBULATORY_CARE_PROVIDER_SITE_OTHER): Payer: PPO | Admitting: Nurse Practitioner

## 2020-08-09 VITALS — BP 134/54 | HR 44 | Temp 97.5°F | Resp 20 | Ht 67.0 in | Wt 129.0 lb

## 2020-08-09 DIAGNOSIS — R29898 Other symptoms and signs involving the musculoskeletal system: Secondary | ICD-10-CM

## 2020-08-09 DIAGNOSIS — R413 Other amnesia: Secondary | ICD-10-CM | POA: Diagnosis not present

## 2020-08-09 DIAGNOSIS — K219 Gastro-esophageal reflux disease without esophagitis: Secondary | ICD-10-CM | POA: Diagnosis not present

## 2020-08-09 DIAGNOSIS — N400 Enlarged prostate without lower urinary tract symptoms: Secondary | ICD-10-CM

## 2020-08-09 DIAGNOSIS — R1310 Dysphagia, unspecified: Secondary | ICD-10-CM | POA: Diagnosis not present

## 2020-08-09 DIAGNOSIS — I700 Atherosclerosis of aorta: Secondary | ICD-10-CM | POA: Diagnosis not present

## 2020-08-09 DIAGNOSIS — E785 Hyperlipidemia, unspecified: Secondary | ICD-10-CM | POA: Diagnosis not present

## 2020-08-09 DIAGNOSIS — F5101 Primary insomnia: Secondary | ICD-10-CM | POA: Diagnosis not present

## 2020-08-09 DIAGNOSIS — G629 Polyneuropathy, unspecified: Secondary | ICD-10-CM

## 2020-08-09 DIAGNOSIS — M549 Dorsalgia, unspecified: Secondary | ICD-10-CM | POA: Diagnosis not present

## 2020-08-09 DIAGNOSIS — J449 Chronic obstructive pulmonary disease, unspecified: Secondary | ICD-10-CM

## 2020-08-09 MED ORDER — TERAZOSIN HCL 5 MG PO CAPS: ORAL_CAPSULE | ORAL | 1 refills | Status: DC

## 2020-08-09 MED ORDER — FINASTERIDE 5 MG PO TABS: 5.0000 mg | ORAL_TABLET | Freq: Every day | ORAL | 1 refills | Status: DC

## 2020-08-09 MED ORDER — PANTOPRAZOLE SODIUM 40 MG PO TBEC: 40.0000 mg | DELAYED_RELEASE_TABLET | Freq: Every day | ORAL | 5 refills | Status: DC

## 2020-08-09 MED ORDER — LORAZEPAM 0.5 MG PO TABS: ORAL_TABLET | ORAL | 5 refills | Status: DC

## 2020-08-09 MED ORDER — DONEPEZIL HCL 5 MG PO TABS: 5.0000 mg | ORAL_TABLET | Freq: Every day | ORAL | 5 refills | Status: DC

## 2020-08-09 MED ORDER — MEMANTINE HCL 5 MG PO TABS: 5.0000 mg | ORAL_TABLET | Freq: Three times a day (TID) | ORAL | 5 refills | Status: DC

## 2020-08-09 NOTE — Progress Notes (Signed)
Subjective:    Patient ID: Carlos Brooks, male    DOB: Jan 19, 1927, 87 y.o.   MRN: [id number]   Chief Complaint: Medical Management of Chronic Issues    HPI:  1. Aortic atherosclerosis ([place]) Has not seen caridology in years  2. Thoracic aorta atherosclerosis ([place]) Hs not seen cardiology in years  3. COPD (chronic obstructive pulmonary disease) with chronic bronchitis ([facility]) Denies cough.doe snot smoke  4. Gastroesophageal reflux disease without esophagitis Is on protonix daily and is doing well.  5. Neuropathy Has in both feet  6. Weakness of right leg Walks very slowly and uses cane at times  7. Benign prostatic hyperplasia without lower urinary tract symptoms Denies any problems voiding  8. Back pain, unspecified back location, unspecified back pain laterality, unspecified chronicity Has chronic back pain. Just takes tylenol as needed  9. Difficulty swallowing solids *gets choked frequently. Needs to go back and see gastro.  10. Hyperlipidemia LDL goal <100 Eats whatever he want sto  11. Primary insomnia Sleeps about 6-7 hours a night.  12. Memory loss Is on namenda and aricept. His wife says he is maintaing. Repeats hisself a lot.    Outpatient Encounter Medications as of 08/09/2020  Medication Sig  . aspirin 81 MG EC tablet Take 1 tablet (81 mg total) by mouth daily.  . finasteride (PROSCAR) 5 MG tablet Take 1 tablet (5 mg total) by mouth daily.  . fluticasone (FLONASE) 50 MCG/ACT nasal spray Place 2 sprays into both nostrils daily.  . Fluticasone-Umeclidin-Vilant (TRELEGY ELLIPTA) 100-62.5-25 MCG/INH AEPB Inhale 1 puff into the lungs daily.  Marland Kitchen guaiFENesin (MUCINEX) 600 MG 12 hr tablet Take 2 tablets (1,200 mg total) by mouth 2 (two) times daily as needed.  Marland Kitchen levocetirizine (XYZAL) 5 MG tablet Take 0.5 tablets (2.5 mg total) by mouth every evening.  Marland Kitchen LORazepam (ATIVAN) 0.5 MG tablet Take nightly as needed for sleep  . memantine (NAMENDA) 5 MG tablet  Take 1 tablet (5 mg total) by mouth in the morning, at noon, and at bedtime.  Marland Kitchen OVER THE COUNTER MEDICATION Vit b 12 1000 IM Q month  . pantoprazole (PROTONIX) 40 MG tablet Take 1 tablet (40 mg total) by mouth daily.  Marland Kitchen PROAIR HFA 108 (90 Base) MCG/ACT inhaler 2 PUFFS EVERY 6 HOURS AS NEEDED FOR WHEEZING OR SHORTNESS OF BREATH  . terazosin (HYTRIN) 5 MG capsule TAKE (1) CAPSULE DAILY  . ursodiol (ACTIGALL) 300 MG capsule Take 1 capsule (300 mg total) by mouth 3 (three) times daily.  Marland Kitchen donepezil (ARICEPT) 5 MG tablet Take 1 tablet (5 mg total) by mouth at bedtime.   Facility-Administered Encounter Medications as of 08/09/2020  Medication  . cyanocobalamin ((VITAMIN B-12)) injection 1,000 mcg    Past Surgical History:  Procedure Laterality Date  . EYE SURGERY    . HERNIA REPAIR     x 3, inguinal   . skin cancer removal     head    Family History  Problem Relation Age of Onset  . Cancer Mother        [name]  . Cancer Father        lung  . Heart attack Brother 76       Not clear details  . Heart attack Son 70       Died with an MI  . Heart disease Son   . Cancer Brother        liver  . Heart attack Brother 48  . Aneurysm Brother   .  Hypertension Son   . Obesity Son   . Asthma Son     New complaints: None today  Social history: Lives with wife. His daughter checks on him daily.  Controlled substance contract: 08/09/20    Review of Systems  Constitutional: Negative for diaphoresis.  Eyes: Negative for pain.  Respiratory: Negative for shortness of breath.   Cardiovascular: Negative for chest pain, palpitations and leg swelling.  Gastrointestinal: Negative for abdominal pain.  Endocrine: Negative for polydipsia.  Musculoskeletal: Positive for gait problem (lower leg weakness).  Skin: Negative for rash.  Neurological: Negative for dizziness, weakness and headaches.  Hematological: Does not bruise/bleed easily.  All other systems reviewed and are negative.       Objective:   Physical Exam Vitals and nursing note reviewed.  Constitutional:      Appearance: Normal appearance. He is well-developed.  HENT:     Head: Normocephalic.     Nose: Nose normal.  Eyes:     Pupils: Pupils are equal, round, and reactive to light.  Neck:     Thyroid: No thyroid mass or thyromegaly.     Vascular: No carotid bruit or JVD.     Trachea: Phonation normal.  Cardiovascular:     Rate and Rhythm: Normal rate and regular rhythm.     Comments: bil carotid bruit  Pulmonary:     Effort: Pulmonary effort is normal. No respiratory distress.     Breath sounds: Normal breath sounds.  Abdominal:     General: Bowel sounds are normal.     Palpations: Abdomen is soft.     Tenderness: There is no abdominal tenderness.  Musculoskeletal:        General: Normal range of motion.     Cervical back: Normal range of motion and neck supple.     Comments: Rises slowly from seating to standing Gait slow and steady  Lymphadenopathy:     Cervical: No cervical adenopathy.  Skin:    General: Skin is warm and dry.  Neurological:     Mental Status: He is alert and oriented to person, place, and time.  Psychiatric:        Behavior: Behavior normal.        Thought Content: Thought content normal.        Judgment: Judgment normal.     BP (!) 134/54   Pulse (!) 44   Temp (!) 97.5 F (36.4 C) (Temporal)   Resp 20   Ht 5' 7"  (1.702 m)   Wt 129 lb (58.5 kg)   SpO2 96%   BMI 20.20 kg/m        Assessment & Plan:  URI TURNBOUGH comes in today with chief complaint of Medical Management of Chronic Issues   Diagnosis and orders addressed:  1. Aortic atherosclerosis ([place]) Needs to followup wit cardiology  2. Thoracic aorta atherosclerosis ([place])  3. COPD (chronic obstructive pulmonary disease) with chronic bronchitis ([place])  4. Gastroesophageal reflux disease without esophagitis Avoid spicy foods Do not eat 2 hours prior to bedtime - pantoprazole (PROTONIX) 40 MG  tablet; Take 1 tablet (40 mg total) by mouth daily.  Dispense: 30 tablet; Refill: 5  5. Neuropathy Do not go barefooted  6. Weakness of right leg Fall prevention  7. Benign prostatic hyperplasia without lower urinary tract symptoms - finasteride (PROSCAR) 5 MG tablet; Take 1 tablet (5 mg total) by mouth daily.  Dispense: 90 tablet; Refill: 1 - terazosin (HYTRIN) 5 MG capsule; TAKE (1) CAPSULE DAILY  Dispense:  90 capsule; Refill: 1  8. Back pain, unspecified back location, unspecified back pain laterality, unspecified chronicity Continue tylenol as needed  9. Difficulty swallowing solids Referral back to GI Chew food well before swallowing - Ambulatory referral to Gastroenterology  10. Hyperlipidemia LDL goal <100 Low fat diet  11. Primary insomnia Bedtime routine - LORazepam (ATIVAN) 0.5 MG tablet; Take nightly as needed for sleep  Dispense: 30 tablet; Refill: 5  12. Memory loss - donepezil (ARICEPT) 5 MG tablet; Take 1 tablet (5 mg total) by mouth at bedtime.  Dispense: 30 tablet; Refill: 5 - memantine (NAMENDA) 5 MG tablet; Take 1 tablet (5 mg total) by mouth in the morning, at noon, and at bedtime.  Dispense: 90 tablet; Refill: 5  Orders Placed This Encounter  Procedures  . CBC with Differential/Platelet  . CMP14+EGFR  . Lipid panel  . Ambulatory referral to Gastroenterology    Referral Priority:   Routine    Referral Type:   Consultation    Referral Reason:   Specialty Services Required    Number of Visits Requested:   1    Labs pending Health Maintenance reviewed Diet and exercise encouraged  Follow up plan: 6 months   Mary-Margaret Hassell Done, FNP

## 2020-08-09 NOTE — Patient Instructions (Signed)
Fall Prevention in the Home, Adult Falls can cause injuries and can happen to people of all ages. There are many things you can do to make your home safe and to help prevent falls. Ask for help when making these changes. What actions can I take to prevent falls? General Instructions  Use good lighting in all rooms. Replace any light bulbs that burn out.  Turn on the lights in dark areas. Use night-lights.  Keep items that you use often in easy-to-reach places. Lower the shelves around your home if needed.  Set up your furniture so you have a clear path. Avoid moving your furniture around.  Do not have throw rugs or other things on the floor that can make you trip.  Avoid walking on wet floors.  If any of your floors are uneven, fix them.  Add color or contrast paint or tape to clearly mark and help you see: ? Grab bars or handrails. ? First and last steps of staircases. ? Where the edge of each step is.  If you use a stepladder: ? Make sure that it is fully opened. Do not climb a closed stepladder. ? Make sure the sides of the stepladder are locked in place. ? Ask someone to hold the stepladder while you use it.  Know where your pets are when moving through your home. What can I do in the bathroom?  Keep the floor dry. Clean up any water on the floor right away.  Remove soap buildup in the tub or shower.  Use nonskid mats or decals on the floor of the tub or shower.  Attach bath mats securely with double-sided, nonslip rug tape.  If you need to sit down in the shower, use a plastic, nonslip stool.  Install grab bars by the toilet and in the tub and shower. Do not use towel bars as grab bars.      What can I do in the bedroom?  Make sure that you have a light by your bed that is easy to reach.  Do not use any sheets or blankets for your bed that hang to the floor.  Have a firm chair with side arms that you can use for support when you get dressed. What can I do in  the kitchen?  Clean up any spills right away.  If you need to reach something above you, use a step stool with a grab bar.  Keep electrical cords out of the way.  Do not use floor polish or wax that makes floors slippery. What can I do with my stairs?  Do not leave any items on the stairs.  Make sure that you have a light switch at the top and the bottom of the stairs.  Make sure that there are handrails on both sides of the stairs. Fix handrails that are broken or loose.  Install nonslip stair treads on all your stairs.  Avoid having throw rugs at the top or bottom of the stairs.  Choose a carpet that does not hide the edge of the steps on the stairs.  Check carpeting to make sure that it is firmly attached to the stairs. Fix carpet that is loose or worn. What can I do on the outside of my home?  Use bright outdoor lighting.  Fix the edges of walkways and driveways and fix any cracks.  Remove anything that might make you trip as you walk through a door, such as a raised step or threshold.  Trim any   bushes or trees on paths to your home.  Check to see if handrails are loose or broken and that both sides of all steps have handrails.  Install guardrails along the edges of any raised decks and porches.  Clear paths of anything that can make you trip, such as tools or rocks.  Have leaves, snow, or ice cleared regularly.  Use sand or salt on paths during winter.  Clean up any spills in your garage right away. This includes grease or oil spills. What other actions can I take?  Wear shoes that: ? Have a low heel. Do not wear high heels. ? Have rubber bottoms. ? Feel good on your feet and fit well. ? Are closed at the toe. Do not wear open-toe sandals.  Use tools that help you move around if needed. These include: ? Canes. ? Walkers. ? Scooters. ? Crutches.  Review your medicines with your doctor. Some medicines can make you feel dizzy. This can increase your chance  of falling. Ask your doctor what else you can do to help prevent falls. Where to find more information  Centers for Disease Control and Prevention, [employer]: www.cdc.gov  National Institute on Aging: www.nia.nih.gov Contact a doctor if:  You are afraid of falling at home.  You feel weak, drowsy, or dizzy at home.  You fall at home. Summary  There are many simple things that you can do to make your home safe and to help prevent falls.  Ways to make your home safe include removing things that can make you trip and installing grab bars in the bathroom.  Ask for help when making these changes in your home. This information is not intended to replace advice given to you by your health care provider. Make sure you discuss any questions you have with your health care provider. Document Revised: 09/27/2019 Document Reviewed: 09/27/2019 Elsevier Patient Education  2021 [employer]  

## 2020-08-10 LAB — CMP14+EGFR
ALT: 8 IU/L (ref 0–44)
AST: 17 IU/L (ref 0–40)
Albumin/Globulin Ratio: 2.4 — ABNORMAL HIGH (ref 1.2–2.2)
Albumin: 4.7 g/dL — ABNORMAL HIGH (ref 3.5–4.6)
Alkaline Phosphatase: 54 IU/L (ref 44–121)
BUN/Creatinine Ratio: 22 (ref 10–24)
BUN: 24 mg/dL (ref 10–36)
Bilirubin Total: 0.4 mg/dL (ref 0.0–1.2)
CO2: 27 mmol/L (ref 20–29)
Calcium: 9.7 mg/dL (ref 8.6–10.2)
Chloride: 101 mmol/L (ref 96–106)
Creatinine, Ser: 1.07 mg/dL (ref 0.76–1.27)
Globulin, Total: 2 g/dL (ref 1.5–4.5)
Glucose: 84 mg/dL (ref 65–99)
Potassium: 4.5 mmol/L (ref 3.5–5.2)
Sodium: 143 mmol/L (ref 134–144)
Total Protein: 6.7 g/dL (ref 6.0–8.5)
eGFR: 65 mL/min/{1.73_m2} (ref >59)

## 2020-08-10 LAB — LIPID PANEL
Chol/HDL Ratio: 4.4 ratio (ref 0.0–5.0)
Cholesterol, Total: 210 mg/dL — ABNORMAL HIGH (ref 100–199)
HDL: 48 mg/dL (ref >39)
LDL Chol Calc (NIH): 136 mg/dL — ABNORMAL HIGH (ref 0–99)
Triglycerides: 143 mg/dL (ref 0–149)
VLDL Cholesterol Cal: 26 mg/dL (ref 5–40)

## 2020-08-10 LAB — CBC WITH DIFFERENTIAL/PLATELET
Basophils Absolute: 0.1 10*3/uL (ref 0.0–0.2)
Basos: 1 %
EOS (ABSOLUTE): 0.8 10*3/uL — ABNORMAL HIGH (ref 0.0–0.4)
Eos: 10 %
Hematocrit: 40 % (ref 37.5–51.0)
Hemoglobin: 13.5 g/dL (ref 13.0–17.7)
Immature Grans (Abs): 0 10*3/uL (ref 0.0–0.1)
Immature Granulocytes: 0 %
Lymphocytes Absolute: 2.1 10*3/uL (ref 0.7–3.1)
Lymphs: 26 %
MCH: 30.7 pg (ref 26.6–33.0)
MCHC: 33.8 g/dL (ref 31.5–35.7)
MCV: 91 fL (ref 79–97)
Monocytes Absolute: 0.8 10*3/uL (ref 0.1–0.9)
Monocytes: 10 %
Neutrophils Absolute: 4.2 10*3/uL (ref 1.4–7.0)
Neutrophils: 53 %
Platelets: 157 10*3/uL (ref 150–450)
RBC: 4.4 x10E6/uL (ref 4.14–5.80)
RDW: 14.2 % (ref 11.6–15.4)
WBC: 8 10*3/uL (ref 3.4–10.8)

## 2020-08-13 ENCOUNTER — Encounter (INDEPENDENT_AMBULATORY_CARE_PROVIDER_SITE_OTHER): Payer: Self-pay | Admitting: *Deleted

## 2020-08-15 ENCOUNTER — Other Ambulatory Visit: Payer: Self-pay

## 2020-08-15 ENCOUNTER — Ambulatory Visit (INDEPENDENT_AMBULATORY_CARE_PROVIDER_SITE_OTHER): Payer: PPO

## 2020-08-15 DIAGNOSIS — E538 Deficiency of other specified B group vitamins: Secondary | ICD-10-CM

## 2020-08-15 NOTE — Progress Notes (Signed)
B12 injection given to patient and tolerated well.  

## 2020-08-30 DIAGNOSIS — H04123 Dry eye syndrome of bilateral lacrimal glands: Secondary | ICD-10-CM | POA: Diagnosis not present

## 2020-08-30 DIAGNOSIS — H40033 Anatomical narrow angle, bilateral: Secondary | ICD-10-CM | POA: Diagnosis not present

## 2020-09-05 ENCOUNTER — Other Ambulatory Visit: Payer: Self-pay | Admitting: Nurse Practitioner

## 2020-09-05 DIAGNOSIS — J301 Allergic rhinitis due to pollen: Secondary | ICD-10-CM

## 2020-09-16 ENCOUNTER — Ambulatory Visit (INDEPENDENT_AMBULATORY_CARE_PROVIDER_SITE_OTHER): Payer: PPO | Admitting: *Deleted

## 2020-09-16 ENCOUNTER — Other Ambulatory Visit: Payer: Self-pay

## 2020-09-16 DIAGNOSIS — E538 Deficiency of other specified B group vitamins: Secondary | ICD-10-CM

## 2020-09-23 DIAGNOSIS — F0150 Vascular dementia without behavioral disturbance: Secondary | ICD-10-CM | POA: Diagnosis not present

## 2020-09-23 DIAGNOSIS — Z515 Encounter for palliative care: Secondary | ICD-10-CM | POA: Diagnosis not present

## 2020-09-23 DIAGNOSIS — G309 Alzheimer's disease, unspecified: Secondary | ICD-10-CM | POA: Diagnosis not present

## 2020-09-23 DIAGNOSIS — J449 Chronic obstructive pulmonary disease, unspecified: Secondary | ICD-10-CM | POA: Diagnosis not present

## 2020-10-01 ENCOUNTER — Other Ambulatory Visit: Payer: Self-pay | Admitting: Nurse Practitioner

## 2020-10-01 DIAGNOSIS — R413 Other amnesia: Secondary | ICD-10-CM

## 2020-10-17 ENCOUNTER — Other Ambulatory Visit: Payer: Self-pay

## 2020-10-17 ENCOUNTER — Ambulatory Visit (INDEPENDENT_AMBULATORY_CARE_PROVIDER_SITE_OTHER): Payer: PPO

## 2020-10-17 ENCOUNTER — Encounter (INDEPENDENT_AMBULATORY_CARE_PROVIDER_SITE_OTHER): Payer: Self-pay | Admitting: Gastroenterology

## 2020-10-17 ENCOUNTER — Ambulatory Visit (INDEPENDENT_AMBULATORY_CARE_PROVIDER_SITE_OTHER): Payer: PPO | Admitting: Gastroenterology

## 2020-10-17 VITALS — BP 166/57 | HR 73 | Temp 99.3°F | Ht 67.0 in | Wt 133.0 lb

## 2020-10-17 DIAGNOSIS — K219 Gastro-esophageal reflux disease without esophagitis: Secondary | ICD-10-CM

## 2020-10-17 DIAGNOSIS — R079 Chest pain, unspecified: Secondary | ICD-10-CM | POA: Diagnosis not present

## 2020-10-17 DIAGNOSIS — K819 Cholecystitis, unspecified: Secondary | ICD-10-CM | POA: Diagnosis not present

## 2020-10-17 DIAGNOSIS — E538 Deficiency of other specified B group vitamins: Secondary | ICD-10-CM

## 2020-10-17 DIAGNOSIS — R1310 Dysphagia, unspecified: Secondary | ICD-10-CM | POA: Diagnosis not present

## 2020-10-17 NOTE — Patient Instructions (Addendum)
-  Continue protonix '40mg'$  once in the morning, you can try taking pepcid '20mg'$  in the evening before dinner.  -We will refer you to a cardiologist for further evaluation of you L sided chest and Left arm pain, please also give your PCP a call so that they can keep an eye on this as well. -will will order a combined barium swallow/SLP evaluation   Follow up in 1 year

## 2020-10-17 NOTE — Progress Notes (Signed)
Cyanocobalamin injection given to right deltoid.  Patient tolerated well. 

## 2020-10-17 NOTE — Progress Notes (Signed)
Referring Provider: [name], * Primary Care Physician:  [name], FNP Primary GI Physician: Jenetta Downer  Chief Complaint  Patient presents with   Dysphagia    Trouble speaking, has pain, pain in middle of chest, has one stool a day, eats 3 small meals a day   HPI:   VERSIE PIERRE is a 87 y.o. male with pmh of BPH, skin cancer, COPD, GERD, HLD, Hiatal hernia, dementia and acalculous cholecystitis. Patient here for follow up of cholecystitis.   Patient's wife helping to provide history due to patient's cognitive limitations.  Cholecystitis:  acute cholecystitis without cholelithiasis found in October 2021, patient not a surgical candidate due to his advanced age and medical history. Started on ursodiol '300mg'$  TID in December 2021. Most recent labs in June 2022, HFP WNL. Patient denies any abdominal pain, RUQ pain or R shoulder/back pain. Wife reports patient has some gas, both belching and flatulence but he eats well and does not have any vomiting or fevers.  GERD:  wife states that patient has some issues with reflux maybe 3 times per month, taking protonix '40mg'$  daily currently. Sometimes he takes pepcid in the evenings for breakthrough symptoms which sometimes provides relief.   Dysphagia: patient states that he has some issues with food getting stuck when it goes down. Reports that when he chews thoroughly he does not have much issue with choking however, he sometimes feels as though the food is going down the wrong way.  Patient does endorse some intermittent Left chest pain with radiation to L arm and L neck for about 1 week. He denies sob. No episodes of syncope. No pain at this time.   Last Colonoscopy:(02/07/07), unable to view record  Last Endoscopy:(01/26/05) dilation of esophageal ring  Recommendations:  No further endoscopic evaluations recommended at this time  Past Medical History:  Diagnosis Date   BPH (benign prostatic hyperplasia)     Cancer ([facility])    skin   Cataract    COPD (chronic obstructive pulmonary disease) ([facility])    Generalized headaches    GERD (gastroesophageal reflux disease)    Hiatal hernia    Hyperlipidemia     Past Surgical History:  Procedure Laterality Date   EYE SURGERY     HERNIA REPAIR     x 3, inguinal    skin cancer removal     head    Current Outpatient Medications  Medication Sig Dispense Refill   aspirin 81 MG EC tablet Take 1 tablet (81 mg total) by mouth daily. 30 tablet 12   donepezil (ARICEPT) 5 MG tablet TAKE ONE TABLET AT BEDTIME 30 tablet 5   finasteride (PROSCAR) 5 MG tablet Take 1 tablet (5 mg total) by mouth daily. 90 tablet 1   fluticasone (FLONASE) 50 MCG/ACT nasal spray Place 2 sprays into both nostrils daily. 16 g 6   Fluticasone-Umeclidin-Vilant (TRELEGY ELLIPTA) 100-62.5-25 MCG/INH AEPB Inhale 1 puff into the lungs daily. 28 each 11   guaiFENesin (MUCINEX) 600 MG 12 hr tablet Take 2 tablets (1,200 mg total) by mouth 2 (two) times daily as needed.     levocetirizine (XYZAL) 5 MG tablet TAKE 1/2 TABLET ONCE DAILY AS NEEDED 30 tablet 5   LORazepam (ATIVAN) 0.5 MG tablet Take nightly as needed for sleep 30 tablet 5   memantine (NAMENDA) 5 MG tablet Take 1 tablet (5 mg total) by mouth in the morning, at noon, and at bedtime. 90 tablet 5   OVER THE COUNTER MEDICATION Vit b  12 1000 IM Q month     pantoprazole (PROTONIX) 40 MG tablet Take 1 tablet (40 mg total) by mouth daily. 30 tablet 5   PROAIR HFA 108 (90 Base) MCG/ACT inhaler 2 PUFFS EVERY 6 HOURS AS NEEDED FOR WHEEZING OR SHORTNESS OF BREATH 8.5 g 11   terazosin (HYTRIN) 5 MG capsule TAKE (1) CAPSULE DAILY 90 capsule 1   ursodiol (ACTIGALL) 300 MG capsule Take 1 capsule (300 mg total) by mouth 3 (three) times daily. 90 capsule 11   Current Facility-Administered Medications  Medication Dose Route Frequency Provider Last Rate Last Admin   cyanocobalamin ((VITAMIN B-12)) injection 1,000 mcg  1,000 mcg Intramuscular Q30 days  [name], FNP   1,000 mcg at 10/17/20 1017    Allergies as of 10/17/2020 - Review Complete 10/17/2020  Allergen Reaction Noted   Clarithromycin     Crestor [rosuvastatin calcium]  06/20/2010   Lipitor [atorvastatin calcium]  06/20/2010   Niaspan [niacin er]  06/20/2010   Pneumovax [pneumococcal polysaccharide vaccine]  07/13/2013   Pravachol  06/20/2010   Ranitidine hcl     Simvastatin      Family History  Problem Relation Age of Onset   Cancer Mother        ovairian   Cancer Father        lung   Heart attack Brother 73       Not clear details   Heart attack Son 55       Died with an MI   Heart disease Son    Cancer Brother        liver   Heart attack Brother 21   Aneurysm Brother    Hypertension Son    Obesity Son    Asthma Son     Social History   Socioeconomic History   Marital status: Married    Spouse name: Not on file   Number of children: 5   Years of education: Not on file   Highest education level: 8th grade  Occupational History   Occupation: Retired  Tobacco Use   Smoking status: Former    Types: Cigarettes    Quit date: 04/13/1993    Years since quitting: 27.5   Smokeless tobacco: Never  Vaping Use   Vaping Use: Never used  Substance and Sexual Activity   Alcohol use: No   Drug use: No   Sexual activity: Not Currently  Other Topics Concern   Not on file  Social History Narrative   Not on file   Social Determinants of Health   Financial Resource Strain: Not on file  Food Insecurity: Not on file  Transportation Needs: Not on file  Physical Activity: Not on file  Stress: Not on file  Social Connections: Not on file    Review of Systems: Gen: Denies fever, chills, anorexia. Denies fatigue, weakness, weight loss.  CV: Denies palpitations, syncope, peripheral edema, and claudication. Endorses L chest pain with radiation in L arm and L neck at times Resp: Denies dyspnea at rest, cough, wheezing, coughing up blood, and  pleurisy. GI: Denies vomiting blood, jaundice, and fecal incontinence. Denies dysphagia or odynophagia. Derm: Denies rash, itching, dry skin Psych: Denies depression, anxiety, memory loss, confusion. No homicidal or suicidal ideation.  Heme: Denies bruising, bleeding, and enlarged lymph nodes.  Physical Exam: BP (!) 166/57 (BP Location: Right Arm, Patient Position: Sitting, Cuff Size: Normal)   Pulse 73   Temp 99.3 F (37.4 C) (Oral)   Ht '5\' 7"'$  (1.702 m)  Wt 133 lb (60.3 kg)   BMI 20.83 kg/m  General:   Alert. No distress noted. Pleasant and cooperative.  Head:  Normocephalic and atraumatic. Eyes:  Conjuctiva clear without scleral icterus. Mouth:  Oral mucosa pink and moist. Good dentition. No lesions. Heart: Normal rate and rhythm, s1 and s2 heart sounds present.  Lungs: Clear lung sounds in all lobes. Respirations equal and unlabored. Abdomen:  +BS, soft, non-tender and non-distended. No rebound or guarding. No HSM or masses noted. Derm: No palmar erythema or jaundice Msk:  Symmetrical without gross deformities. Normal posture. Extremities:  Without edema. Neurologic:  Alert and  oriented x4 Psych:  Alert and cooperative. Normal mood and affect.  ASSESSMENT: RONDALE WASSENBERG is a 87 y.o. male presenting today for follow up of acalculous cholecystitis, GERD and new c/o dysphagia. Patient also reporting intermittent L sided chest pain that radiates to L neck and L arm.  Doing well on urso '300mg'$  TID. No complaints of abdominal pain, nausea or vomiting. Patient denies any R shoulder or R upper back pain. No fevers. Patient's wife reports he eats well and appears to have a good appetite.  Protonix '40mg'$  once daily. Patient's wife reports maybe 3 episodes of breakthrough reflux per month, takes pepcid occasionally as needed. We will continue protonix '40mg'$  once daily at this time, try adding pepcid '20mg'$  in the evening prior to dinner, every night. If reflux is not controlled with this  regimen, we will consider switching to a stronger PPI.  Patient reporting intermittent L sided chest, arm and neck pain x1 week that is intermittent, sometimes better if he does not move his L arm. He denies any shortness of breath or episodes of syncope. We discussed hospital indications such as worsening/severe chest pain, shortness of breath, syncope. We will send referral to cardiology for further evaluation as this could be musculoskeletal, however, cardiac etiology cannot be ruled out. Patient should also follow with PCP regarding this pain.  Some dysphagia reported at times. Patient states that typically if he chews his foods thoroughly he does not have any issues getting choked, but sometimes feels as though his food travels down the wrong way (patient points to mid to L chest). Previous barium esophagram study in January 2021 showed esophageal dysmotility likely presbyesophagus with recommendation of SLP evaluation. Will order modified barium swallow with SLP eval.   PLAN:  Continue urso '300mg'$  TID 2. Referral to cardiology 3. Continue protonix '40mg'$  daily 4. Take pepcid '20mg'$  in the evening before dinner 5. Follow up with PCP regarding chest pain 6. Will order combined modified barium and SLP eval  Follow up 1 year  Case discussed with Dr. Jenetta Downer who is in agreement with plan of care, as outlined above.   Sultana Tierney L. Alver Sorrow, MSN, APRN, AGNP-C Adult-Gerontology Nurse Practitioner [facility]

## 2020-10-21 ENCOUNTER — Other Ambulatory Visit (HOSPITAL_COMMUNITY): Payer: Self-pay | Admitting: Specialist

## 2020-10-21 ENCOUNTER — Ambulatory Visit (INDEPENDENT_AMBULATORY_CARE_PROVIDER_SITE_OTHER): Payer: PPO | Admitting: Gastroenterology

## 2020-10-21 DIAGNOSIS — R1312 Dysphagia, oropharyngeal phase: Secondary | ICD-10-CM

## 2020-10-30 ENCOUNTER — Ambulatory Visit: Payer: PPO | Admitting: Student

## 2020-10-30 DIAGNOSIS — D485 Neoplasm of uncertain behavior of skin: Secondary | ICD-10-CM | POA: Diagnosis not present

## 2020-10-30 DIAGNOSIS — Z85828 Personal history of other malignant neoplasm of skin: Secondary | ICD-10-CM | POA: Diagnosis not present

## 2020-10-30 DIAGNOSIS — L821 Other seborrheic keratosis: Secondary | ICD-10-CM | POA: Diagnosis not present

## 2020-10-30 DIAGNOSIS — D692 Other nonthrombocytopenic purpura: Secondary | ICD-10-CM | POA: Diagnosis not present

## 2020-10-30 DIAGNOSIS — D1801 Hemangioma of skin and subcutaneous tissue: Secondary | ICD-10-CM | POA: Diagnosis not present

## 2020-10-30 DIAGNOSIS — L570 Actinic keratosis: Secondary | ICD-10-CM | POA: Diagnosis not present

## 2020-10-30 DIAGNOSIS — L988 Other specified disorders of the skin and subcutaneous tissue: Secondary | ICD-10-CM | POA: Diagnosis not present

## 2020-11-01 ENCOUNTER — Other Ambulatory Visit (HOSPITAL_COMMUNITY): Payer: PPO

## 2020-11-01 ENCOUNTER — Ambulatory Visit (HOSPITAL_COMMUNITY): Payer: PPO | Admitting: Speech Pathology

## 2020-11-01 ENCOUNTER — Telehealth (HOSPITAL_COMMUNITY): Payer: Self-pay | Admitting: Speech Pathology

## 2020-11-01 NOTE — Telephone Encounter (Signed)
S/w Daughter she is aware of time adjustment on MBSS for 11/15/2020

## 2020-11-15 ENCOUNTER — Other Ambulatory Visit (INDEPENDENT_AMBULATORY_CARE_PROVIDER_SITE_OTHER): Payer: Self-pay | Admitting: Gastroenterology

## 2020-11-15 ENCOUNTER — Other Ambulatory Visit: Payer: Self-pay

## 2020-11-15 ENCOUNTER — Ambulatory Visit (HOSPITAL_COMMUNITY)
Admission: RE | Admit: 2020-11-15 | Discharge: 2020-11-15 | Disposition: A | Discharge status: Home / Self Care | Payer: PPO | Source: Ambulatory Visit | Attending: Gastroenterology | Admitting: Gastroenterology

## 2020-11-15 ENCOUNTER — Ambulatory Visit (HOSPITAL_COMMUNITY): Payer: PPO | Attending: Gastroenterology | Admitting: Speech Pathology

## 2020-11-15 ENCOUNTER — Encounter (HOSPITAL_COMMUNITY): Payer: Self-pay | Admitting: Speech Pathology

## 2020-11-15 ENCOUNTER — Ambulatory Visit (HOSPITAL_COMMUNITY): Payer: PPO | Admitting: Speech Pathology

## 2020-11-15 DIAGNOSIS — R1310 Dysphagia, unspecified: Secondary | ICD-10-CM

## 2020-11-15 DIAGNOSIS — R1312 Dysphagia, oropharyngeal phase: Secondary | ICD-10-CM | POA: Diagnosis not present

## 2020-11-15 NOTE — Therapy (Signed)
[facility] [address] [place], [place], [address] Phone: [phone]   Fax:  [phone]  Modified Barium Swallow  Patient Details  Name: Carlos Brooks MRN: [id number] Date of Birth: February 13, 1927 No data recorded  Encounter Date: 11/15/2020   End of Session - 11/15/20 1406     Visit Number 1    Number of Visits 1    Activity Tolerance Patient tolerated treatment well             Past Medical History:  Past Medical History:  Diagnosis Date   BPH (benign prostatic hyperplasia)    Cancer (HCC)    skin   Cataract    COPD (chronic obstructive pulmonary disease) (HCC)    Generalized headaches    GERD (gastroesophageal reflux disease)    Hiatal hernia    Hyperlipidemia    Past Surgical History:  Past Surgical History:  Procedure Laterality Date   EYE SURGERY     HERNIA REPAIR     x 3, inguinal    skin cancer removal     head   HPI: Carlos Brooks is a 87 y.o. male with pmh of BPH, skin cancer, COPD, GERD, HLD, Hiatal hernia, dementia and acalculous cholecystitis. Per recent visit to GI, "patient states that he has some issues with food getting stuck when it goes down. Reports that when he chews thoroughly he does not have much issue with choking however, he sometimes feels as though the food is going down the wrong way." Based on this report to GI, MBS was requested to objectively assess the oropharyngeal swallowing function.   No data recorded   Assessment / Plan / Recommendation  CHL IP CLINICAL IMPRESSIONS 11/15/2020  Clinical Impression Pt presents with mild oropharyngeal dysphagia characterized by poor oral coordination with thin liquids characterized by lingual pumping, poor bolus cohesion, premature spillage to the level of the pyriforms followed by decreased pharyngeal queeze resulting in valleculae and pyriform residue requiring multiple swallows to completely clear thin liquids; further note flash penetration  with most thin presentations. Despite the above noted impairment, no aspiration was visualized. With puree and solid textures note prolonged oral prep and trace to minimal pharyngeal residue that is cleared with a reflexive second dry swallow. With all presentations of thin, puree and solid textures, Pt demonstrates facial grimacing, reports of discomfort in his chest and globus sensation. Esophageal sweep X2 once after thin trials towards the beginning of the study (reports of discomfort) and once after pill administration both revealed consistencies passing through without incident - radiologist present to confirm. SLP reviewed results at length with Pt, Pt's daughter and Pt's wife including recommendations re: continue with soft diet/moist textures, avoid dry or known problematic solids, thoroughly masticate textures, alternate bites and sips, bolus cohesion was slightly improved with straw/thin administration - this was communicated. Try meds whole with puree/yogurt/pudding if of benefit. Defer to GI for further recommendations and treatment of Esophageal dysphagia and discomfort. There is no further ST f/u needed at this time, thank you for this referral.  SLP Visit Diagnosis Dysphagia, unspecified (R13.10)  Attention and concentration deficit following --  Frontal lobe and executive function deficit following --  Impact on safety and function Mild aspiration risk      CHL IP TREATMENT RECOMMENDATION 11/15/2020  Treatment Recommendations No treatment recommended at this time     No flowsheet data found.  CHL IP DIET RECOMMENDATION 11/15/2020  SLP Diet Recommendations Dysphagia 3 (Mech soft) solids;Thin  liquid  Liquid Administration via Cup;Straw  Medication Administration Whole meds with liquid  Compensations Minimize environmental distractions;Slow rate;Small sips/bites;Follow solids with liquid;Clear throat intermittently  Postural Changes Seated upright at 90 degrees      CHL IP OTHER  RECOMMENDATIONS 11/15/2020  Recommended Consults Consider esophageal assessment  Oral Care Recommendations Oral care BID  Other Recommendations --      No flowsheet data found.    No flowsheet data found.         CHL IP ORAL PHASE 11/15/2020  Oral Phase Impaired  Oral - Pudding Teaspoon --  Oral - Pudding Cup --  Oral - Honey Teaspoon --  Oral - Honey Cup --  Oral - Nectar Teaspoon --  Oral - Nectar Cup --  Oral - Nectar Straw --  Oral - Thin Teaspoon Lingual pumping;Lingual/palatal residue;Decreased bolus cohesion;Premature spillage  Oral - Thin Cup Lingual pumping;Lingual/palatal residue;Decreased bolus cohesion;Premature spillage  Oral - Thin Straw Lingual pumping;Lingual/palatal residue;Decreased bolus cohesion;Premature spillage  Oral - Puree Decreased bolus cohesion;Lingual pumping  Oral - Mech Soft NT  Oral - Regular Decreased bolus cohesion;Lingual pumping  Oral - Multi-Consistency NT  Oral - Pill WFL  Oral Phase - Comment --    CHL IP PHARYNGEAL PHASE 11/15/2020  Pharyngeal Phase Impaired  Pharyngeal- Pudding Teaspoon --  Pharyngeal --  Pharyngeal- Pudding Cup --  Pharyngeal --  Pharyngeal- Honey Teaspoon --  Pharyngeal --  Pharyngeal- Honey Cup --  Pharyngeal --  Pharyngeal- Nectar Teaspoon --  Pharyngeal --  Pharyngeal- Nectar Cup --  Pharyngeal --  Pharyngeal- Nectar Straw --  Pharyngeal --  Pharyngeal- Thin Teaspoon --  Pharyngeal --  Pharyngeal- Thin Cup --  Pharyngeal --  Pharyngeal- Thin Straw --  Pharyngeal --  Pharyngeal- Puree Pharyngeal residue - valleculae;Pharyngeal residue - pyriform  Pharyngeal --  Pharyngeal- Mechanical Soft NT  Pharyngeal --  Pharyngeal- Regular Pharyngeal residue - valleculae;Pharyngeal residue - pyriform  Pharyngeal --  Pharyngeal- Multi-consistency --  Pharyngeal --  Pharyngeal- Pill --  Pharyngeal --  Pharyngeal Comment --     No flowsheet data found.  Bailie Christenbury H. Romie Levee, CCC-SLP Speech Language  Pathologist   Carlos Brooks 11/15/2020, 2:07 PM                        Carlos Brooks 11/15/2020, 2:07 PM  [facility] [address] [place], [place], [address] Phone: [phone]   Fax:  [phone]  Name: MUKUL KRAHMER MRN: [id number] Date of Birth: [birth year]

## 2020-11-18 ENCOUNTER — Ambulatory Visit (INDEPENDENT_AMBULATORY_CARE_PROVIDER_SITE_OTHER): Payer: PPO

## 2020-11-18 ENCOUNTER — Other Ambulatory Visit: Payer: Self-pay

## 2020-11-18 DIAGNOSIS — E538 Deficiency of other specified B group vitamins: Secondary | ICD-10-CM

## 2020-11-18 NOTE — Progress Notes (Signed)
Cyanocobalamin injection given to left deltoid.  Patient tolerated well. 

## 2020-11-21 ENCOUNTER — Ambulatory Visit (INDEPENDENT_AMBULATORY_CARE_PROVIDER_SITE_OTHER): Payer: PPO

## 2020-11-21 ENCOUNTER — Other Ambulatory Visit: Payer: Self-pay

## 2020-11-21 DIAGNOSIS — Z23 Encounter for immunization: Secondary | ICD-10-CM

## 2020-12-06 ENCOUNTER — Other Ambulatory Visit: Payer: Self-pay

## 2020-12-06 ENCOUNTER — Encounter: Payer: Self-pay | Admitting: Student

## 2020-12-06 ENCOUNTER — Ambulatory Visit: Payer: PPO | Admitting: Student

## 2020-12-06 VITALS — BP 142/82 | HR 66 | Ht 67.0 in | Wt 135.0 lb

## 2020-12-06 DIAGNOSIS — I6523 Occlusion and stenosis of bilateral carotid arteries: Secondary | ICD-10-CM

## 2020-12-06 DIAGNOSIS — I447 Left bundle-branch block, unspecified: Secondary | ICD-10-CM | POA: Diagnosis not present

## 2020-12-06 DIAGNOSIS — E782 Mixed hyperlipidemia: Secondary | ICD-10-CM

## 2020-12-06 DIAGNOSIS — R079 Chest pain, unspecified: Secondary | ICD-10-CM

## 2020-12-06 MED ORDER — ISOSORBIDE MONONITRATE ER 30 MG PO TB24: 30.0000 mg | ORAL_TABLET | Freq: Every day | ORAL | 3 refills | Status: DC

## 2020-12-06 NOTE — Patient Instructions (Signed)
Medication Instructions:   START Imdur 30 mg daily   *If you need a refill on your cardiac medications before your next appointment, please call your pharmacy*   Lab Work:  None today  If you have labs (blood work) drawn today and your tests are completely normal, you will receive your results only by: [place] (if you have MyChart) OR A paper copy in the mail If you have any lab test that is abnormal or we need to change your treatment, we will call you to review the results.   Testing/Procedures: Your physician has requested that you have an echocardiogram. Echocardiography is a painless test that uses sound waves to create images of your heart. It provides your doctor with information about the size and shape of your heart and how well your heart's chambers and valves are working. This procedure takes approximately one hour. There are no restrictions for this procedure.    Follow-Up: At [facility], you and your health needs are our priority.  As part of our continuing mission to provide you with exceptional heart care, we have created designated Provider Care Teams.  These Care Teams include your primary Cardiologist (physician) and Advanced Practice Providers (APPs -  Physician Assistants and Nurse Practitioners) who all work together to provide you with the care you need, when you need it.  We recommend signing up for the patient portal called "MyChart".  Sign up information is provided on this After Visit Summary.  MyChart is used to connect with patients for Virtual Visits (Telemedicine).  Patients are able to view lab/test results, encounter notes, upcoming appointments, etc.  Non-urgent messages can be sent to your provider as well.   To learn more about what you can do with MyChart, go to [email or web address].    Your next appointment:   6 month(s)  The format for your next appointment:   In Person  Provider:   Bernerd Pho, PA-C in [place], or  Dr.Hochrein in [place]    Other Instructions None

## 2020-12-06 NOTE — Progress Notes (Signed)
Cardiology Office Note    Date:  12/06/2020   ID:  MYLIK PRO, DOB [id number], MRN [id number]  PCP:  [name], FNP  Cardiologist: Minus Breeding, MD    Chief Complaint  Patient presents with   Follow-up    Recent chest pain    History of Present Illness:    Carlos Brooks is a 87 y.o. male with past medical history of carotid artery stenosis, OSA, HLD, dementia and GERD who presents to the office today for evaluation of chest pain.   He was last examined by Dr. Percival Spanish in 02/2019 and denied any recent chest pain or palpitations. He did describe episodes of difficulty swallowing and getting choked, therefore there were concerns for aspiration. No changes were made to his medication regimen at that time and he was encouraged to start Repatha injections as previously prescribed.  By review of notes, he was evaluated by GI in 10/2020 and reported intermittent episodes of left chest pain with radiation to his arm and neck for the past week. He was referred back to Cardiology for further evaluation.  In talking with the patient and his family members today (accompanied by his wife and daughter), they report he has been experiencing intermittent episodes of chest pain radiating into his left arm sporadically over the past several months. Last episode was a few weeks ago. He frequently references his prior history of long-term esophageal issues during today's encounter but they report he has distinctly mentioned episodes of discomfort along his chest and left shoulder. This typically occurs at rest.  He does not exercise routinely but remains active at baseline for his age and helps with household chores. No specific reports of exertional pain. He reports his breathing has overall been stable and no recent orthopnea, PND or pitting edema. He does continue to have episodes of dysphagia which has been occurring for several years.   Past Medical History:  Diagnosis Date    BPH (benign prostatic hyperplasia)    Cancer (HCC)    skin   Cataract    COPD (chronic obstructive pulmonary disease) (HCC)    Generalized headaches    GERD (gastroesophageal reflux disease)    Hiatal hernia    Hyperlipidemia     Past Surgical History:  Procedure Laterality Date   EYE SURGERY     HERNIA REPAIR     x 3, inguinal    skin cancer removal     head    Current Medications: Outpatient Medications Prior to Visit  Medication Sig Dispense Refill   aspirin 81 MG EC tablet Take 1 tablet (81 mg total) by mouth daily. 30 tablet 12   donepezil (ARICEPT) 5 MG tablet TAKE ONE TABLET AT BEDTIME 30 tablet 5   finasteride (PROSCAR) 5 MG tablet Take 1 tablet (5 mg total) by mouth daily. 90 tablet 1   fluticasone (FLONASE) 50 MCG/ACT nasal spray Place 2 sprays into both nostrils daily. 16 g 6   Fluticasone-Umeclidin-Vilant (TRELEGY ELLIPTA) 100-62.5-25 MCG/INH AEPB Inhale 1 puff into the lungs daily. 28 each 11   guaiFENesin (MUCINEX) 600 MG 12 hr tablet Take 2 tablets (1,200 mg total) by mouth 2 (two) times daily as needed.     levocetirizine (XYZAL) 5 MG tablet TAKE 1/2 TABLET ONCE DAILY AS NEEDED 30 tablet 5   LORazepam (ATIVAN) 0.5 MG tablet Take nightly as needed for sleep 30 tablet 5   memantine (NAMENDA) 5 MG tablet Take 1 tablet (5 mg total) by mouth in the  morning, at noon, and at bedtime. 90 tablet 5   OVER THE COUNTER MEDICATION Vit b 12 1000 IM Q month     pantoprazole (PROTONIX) 40 MG tablet Take 1 tablet (40 mg total) by mouth daily. 30 tablet 5   PROAIR HFA 108 (90 Base) MCG/ACT inhaler 2 PUFFS EVERY 6 HOURS AS NEEDED FOR WHEEZING OR SHORTNESS OF BREATH 8.5 g 11   terazosin (HYTRIN) 5 MG capsule TAKE (1) CAPSULE DAILY 90 capsule 1   ursodiol (ACTIGALL) 300 MG capsule Take 1 capsule (300 mg total) by mouth 3 (three) times daily. (Patient not taking: Reported on 12/06/2020) 90 capsule 11   Facility-Administered Medications Prior to Visit  Medication Dose Route Frequency  Provider Last Rate Last Admin   cyanocobalamin ((VITAMIN B-12)) injection 1,000 mcg  1,000 mcg Intramuscular Q30 days [name], FNP   1,000 mcg at 11/18/20 6063     Allergies:   Clarithromycin, Crestor [rosuvastatin calcium], Lipitor [atorvastatin calcium], Niaspan [niacin er], Pneumovax [pneumococcal polysaccharide vaccine], Pravachol, Ranitidine hcl, and Simvastatin   Social History   Socioeconomic History   Marital status: Married    Spouse name: Not on file   Number of children: 5   Years of education: Not on file   Highest education level: 8th grade  Occupational History   Occupation: Retired  Tobacco Use   Smoking status: Former    Types: Cigarettes    Quit date: 04/13/1993    Years since quitting: 27.6   Smokeless tobacco: Never  Vaping Use   Vaping Use: Never used  Substance and Sexual Activity   Alcohol use: No   Drug use: No   Sexual activity: Not Currently  Other Topics Concern   Not on file  Social History Narrative   Not on file   Social Determinants of Health   Financial Resource Strain: Not on file  Food Insecurity: Not on file  Transportation Needs: Not on file  Physical Activity: Not on file  Stress: Not on file  Social Connections: Not on file     Family History:  The patient's family history includes Aneurysm in his brother; Asthma in his son; Cancer in his brother, father, and mother; Heart attack (age of onset: 34) in his son; Heart attack (age of onset: 31) in his brother; Heart attack (age of onset: 25) in his brother; Heart disease in his son; Hypertension in his son; Obesity in his son.   Review of Systems:    Please see the history of present illness.     All other systems reviewed and are otherwise negative except as noted above.   Physical Exam:    VS:  BP (!) 142/82   Pulse 66   Ht 5\' 7"  (1.702 m)   Wt 135 lb (61.2 kg)   SpO2 96%   BMI 21.14 kg/m    General: Pleasant elderly male appearing in no acute  distress. Head: Normocephalic, atraumatic. Neck: Right carotid bruit.  JVD not elevated.  Lungs: Respirations regular and unlabored, without wheezes or rales.  Heart: Regular rate and rhythm. No S3 or S4.  2/6 systolic murmur along RUSB.  Abdomen: Appears non-distended. No obvious abdominal masses. Msk:  Strength and tone appear normal for age. No obvious joint deformities or effusions. Extremities: No clubbing or cyanosis. No pitting edema.  Distal pedal pulses are 2+ bilaterally. Neuro: Alert and oriented X 3. Moves all extremities spontaneously. No focal deficits noted. Psych:  Responds to questions appropriately with a normal affect. Skin: No  rashes or lesions noted  Wt Readings from Last 3 Encounters:  12/06/20 135 lb (61.2 kg)  10/17/20 133 lb (60.3 kg)  08/09/20 129 lb (58.5 kg)     Studies/Labs Reviewed:   EKG:  EKG is ordered today.  The ekg ordered today demonstrates NSR, HR 63 with known LBBB.   Recent Labs: 01/04/2020: Magnesium 2.1 08/09/2020: ALT 8; BUN 24; Creatinine, Ser 1.07; Hemoglobin 13.5; Platelets 157; Potassium 4.5; Sodium 143   Lipid Panel    Component Value Date/Time   CHOL 210 (H) 08/09/2020 1258   CHOL 197 08/04/2012 1151   TRIG 143 08/09/2020 1258   TRIG 191 (H) 10/21/2015 1048   TRIG 162 (H) 08/04/2012 1151   HDL 48 08/09/2020 1258   HDL 46 10/21/2015 1048   HDL 45 08/04/2012 1151   CHOLHDL 4.4 08/09/2020 1258   LDLCALC 136 (H) 08/09/2020 1258   LDLCALC 154 (H) 12/05/2013 0920   LDLCALC 120 (H) 08/04/2012 1151    Additional studies/ records that were reviewed today include:   Carotid Dopplers: 12/2018 IMPRESSION: 1. Less than 50% stenosis of the right ICA by both grayscale and color imaging. 2. Less than 50% stenosis of the left ICA by both grayscale and color imaging. 3. Ulcerative plaques are noted within the left carotid bulb and proximal left ICA.   Assessment:    1. Chest pain of uncertain etiology   2. LBBB (left bundle branch  block)   3. Mixed hyperlipidemia   4. Bilateral carotid artery stenosis      Plan:   In order of problems listed above:  1. Chest Pain with Mixed Features - His pain has mixed qualities as it typically occurs at rest and spontaneously resolves within a few minutes but does radiate into his shoulder at times. He did have coronary calcification by prior CT in 12/2019. Reviewed options with the patient and his family today and they overall prefer a more conservative approach which I am in agreement with given his age and dementia. Therefore, will plan to obtain an echocardiogram for assessment of any structural abnormalities. If EF reduced, would anticipate medical therapy. Will add Imdur 30mg  daily for now to see if this helps with his symptoms and it may also assist with his esophageal issues as well. They do have SL NTG available at the home if needed and we reviewed how to use this.   2. LBBB - Noted on EKG today and overall similar to prior tracings.   3. HLD - He was previously on Repatha but family members report they decided to discontinue this last year due to his advanced age and dementia which certainly seems reasonable.   4. Carotid Artery Stenosis - Dopplers in 12/2018 showed < 50% stenosis bilaterally. He has wished to focus on a more conservative approach. Remains on ASA 81mg  daily. No longer on statin therapy.   Medication Adjustments/Labs and Tests Ordered: Current medicines are reviewed at length with the patient today.  Concerns regarding medicines are outlined above.  Medication changes, Labs and Tests ordered today are listed in the Patient Instructions below. Patient Instructions  Medication Instructions:   START Imdur 30 mg daily   *If you need a refill on your cardiac medications before your next appointment, please call your pharmacy*   Lab Work:  None today  If you have labs (blood work) drawn today and your tests are completely normal, you will receive  your results only by: [place] (if you have MyChart) OR A  paper copy in the mail If you have any lab test that is abnormal or we need to change your treatment, we will call you to review the results.   Testing/Procedures: Your physician has requested that you have an echocardiogram. Echocardiography is a painless test that uses sound waves to create images of your heart. It provides your doctor with information about the size and shape of your heart and how well your heart's chambers and valves are working. This procedure takes approximately one hour. There are no restrictions for this procedure.    Follow-Up: At [facility], you and your health needs are our priority.  As part of our continuing mission to provide you with exceptional heart care, we have created designated Provider Care Teams.  These Care Teams include your primary Cardiologist (physician) and Advanced Practice Providers (APPs -  Physician Assistants and Nurse Practitioners) who all work together to provide you with the care you need, when you need it.  We recommend signing up for the patient portal called "MyChart".  Sign up information is provided on this After Visit Summary.  MyChart is used to connect with patients for Virtual Visits (Telemedicine).  Patients are able to view lab/test results, encounter notes, upcoming appointments, etc.  Non-urgent messages can be sent to your provider as well.   To learn more about what you can do with MyChart, go to [email or web address].    Your next appointment:   6 month(s)  The format for your next appointment:   In Person  Provider:   Bernerd Pho, PA-C in [place], or Dr.Hochrein in [place]    Other Instructions None    Signed, Erma Heritage, Hershal Coria  12/06/2020 7:59 PM    [place] [address] [place], [place] [address] Phone: [phone] Fax: [phone]

## 2020-12-09 ENCOUNTER — Ambulatory Visit (HOSPITAL_COMMUNITY)
Admission: RE | Admit: 2020-12-09 | Discharge: 2020-12-09 | Disposition: A | Discharge status: Home / Self Care | Payer: PPO | Source: Ambulatory Visit | Attending: Nurse Practitioner | Admitting: Nurse Practitioner

## 2020-12-09 ENCOUNTER — Other Ambulatory Visit: Payer: Self-pay

## 2020-12-09 DIAGNOSIS — R079 Chest pain, unspecified: Secondary | ICD-10-CM | POA: Diagnosis not present

## 2020-12-09 LAB — ECHOCARDIOGRAM COMPLETE
Area-P 1/2: 1.98 cm2
S' Lateral: 3.1 cm

## 2020-12-09 NOTE — Progress Notes (Signed)
*  PRELIMINARY RESULTS* Echocardiogram 2D Echocardiogram has been performed.  Carlos Brooks 12/09/2020, 2:40 PM

## 2020-12-18 ENCOUNTER — Ambulatory Visit (INDEPENDENT_AMBULATORY_CARE_PROVIDER_SITE_OTHER): Payer: PPO | Admitting: Family Medicine

## 2020-12-18 ENCOUNTER — Other Ambulatory Visit: Payer: Self-pay

## 2020-12-18 DIAGNOSIS — E538 Deficiency of other specified B group vitamins: Secondary | ICD-10-CM

## 2021-01-20 ENCOUNTER — Other Ambulatory Visit: Payer: Self-pay

## 2021-01-20 ENCOUNTER — Ambulatory Visit (INDEPENDENT_AMBULATORY_CARE_PROVIDER_SITE_OTHER): Payer: PPO | Admitting: *Deleted

## 2021-01-20 DIAGNOSIS — E538 Deficiency of other specified B group vitamins: Secondary | ICD-10-CM

## 2021-01-20 NOTE — Progress Notes (Signed)
Vitamin b12 injection given and patient tolerated well.  

## 2021-01-21 DIAGNOSIS — Z23 Encounter for immunization: Secondary | ICD-10-CM | POA: Diagnosis not present

## 2021-01-23 NOTE — Progress Notes (Signed)
Cardiology Office Note    Date:  01/25/2021   ID:  Carlos Brooks, DOB 1926/10/17, MRN [id number]  PCP:  [name], FNP  Cardiologist: Minus Breeding, MD    Chief Complaint  Patient presents with   Follow-up    6 week visit    History of Present Illness:    Carlos Brooks is a 87 y.o. male with past medical history of carotid artery stenosis, LBBB, OSA, HLD, dementia and GERD who presents to the office today for 6-week follow-up.  He was examined by myself in 11/2020 and most history was provided by his daughter and wife in the setting of his dementia. He did report intermittent episodes of chest pain radiating into his left arm over the past several months which had occurred at rest. The patient and his family overall preferred a conservative approach given his age and dementia which certainly seemed reasonable. Therefore he was started on Imdur 30 mg daily and a follow-up echocardiogram was recommended to reassess EF and anticipate medical therapy pending results. His echocardiogram did show a reduced EF of 40 to 45% with no prior images available for comparison. He also had global hypokinesis, Grade 1 DD, normal RV function, mild MR and mild AI.  In talking with the patient and his family members today (accompanied by his daughter and wife), his main complaint is pain along his left shoulder which is typically worse with positional changes and improves with heat or if he applies pressure to the area. Daughter reports he has known severe arthritis and spinal issues. He denies any specific chest pain and his wife says he has not asked for SL NTG in 2-3 weeks. No reported orthopnea, PND or pitting edema. He does have baseline dyspnea on exertion.    Past Medical History:  Diagnosis Date   BPH (benign prostatic hyperplasia)    Cancer (HCC)    skin   Cataract    COPD (chronic obstructive pulmonary disease) (HCC)    Generalized headaches    GERD (gastroesophageal  reflux disease)    Hiatal hernia    Hyperlipidemia     Past Surgical History:  Procedure Laterality Date   EYE SURGERY     HERNIA REPAIR     x 3, inguinal    skin cancer removal     head    Current Medications: Outpatient Medications Prior to Visit  Medication Sig Dispense Refill   aspirin 81 MG EC tablet Take 1 tablet (81 mg total) by mouth daily. 30 tablet 12   donepezil (ARICEPT) 5 MG tablet TAKE ONE TABLET AT BEDTIME 30 tablet 5   finasteride (PROSCAR) 5 MG tablet Take 1 tablet (5 mg total) by mouth daily. 90 tablet 1   fluticasone (FLONASE) 50 MCG/ACT nasal spray Place 2 sprays into both nostrils daily. 16 g 6   Fluticasone-Umeclidin-Vilant (TRELEGY ELLIPTA) 100-62.5-25 MCG/INH AEPB Inhale 1 puff into the lungs daily. 28 each 11   guaiFENesin (MUCINEX) 600 MG 12 hr tablet Take 2 tablets (1,200 mg total) by mouth 2 (two) times daily as needed.     isosorbide mononitrate (IMDUR) 30 MG 24 hr tablet Take 1 tablet (30 mg total) by mouth daily. 90 tablet 3   levocetirizine (XYZAL) 5 MG tablet TAKE 1/2 TABLET ONCE DAILY AS NEEDED 30 tablet 5   LORazepam (ATIVAN) 0.5 MG tablet Take nightly as needed for sleep 30 tablet 5   memantine (NAMENDA) 5 MG tablet Take 1 tablet (5 mg total) by mouth  in the morning, at noon, and at bedtime. 90 tablet 5   pantoprazole (PROTONIX) 40 MG tablet Take 1 tablet (40 mg total) by mouth daily. 30 tablet 5   PROAIR HFA 108 (90 Base) MCG/ACT inhaler 2 PUFFS EVERY 6 HOURS AS NEEDED FOR WHEEZING OR SHORTNESS OF BREATH 8.5 g 11   terazosin (HYTRIN) 5 MG capsule TAKE (1) CAPSULE DAILY 90 capsule 1   ursodiol (ACTIGALL) 300 MG capsule Take 1 capsule (300 mg total) by mouth 3 (three) times daily. 90 capsule 11   OVER THE COUNTER MEDICATION Vit b 12 1000 IM Q month (Patient not taking: Reported on 01/24/2021)     Facility-Administered Medications Prior to Visit  Medication Dose Route Frequency Provider Last Rate Last Admin   cyanocobalamin ((VITAMIN B-12))  injection 1,000 mcg  1,000 mcg Intramuscular Q30 days [name], FNP   1,000 mcg at 01/20/21 1610     Allergies:   Clarithromycin, Crestor [rosuvastatin calcium], Lipitor [atorvastatin calcium], Niaspan [niacin er], Pneumovax [pneumococcal polysaccharide vaccine], Pravachol, Ranitidine hcl, and Simvastatin   Social History   Socioeconomic History   Marital status: Married    Spouse name: Not on file   Number of children: 5   Years of education: Not on file   Highest education level: 8th grade  Occupational History   Occupation: Retired  Tobacco Use   Smoking status: Former    Types: Cigarettes    Quit date: 04/13/1993    Years since quitting: 27.8   Smokeless tobacco: Never  Vaping Use   Vaping Use: Never used  Substance and Sexual Activity   Alcohol use: No   Drug use: No   Sexual activity: Not Currently  Other Topics Concern   Not on file  Social History Narrative   Not on file   Social Determinants of Health   Financial Resource Strain: Not on file  Food Insecurity: Not on file  Transportation Needs: Not on file  Physical Activity: Not on file  Stress: Not on file  Social Connections: Not on file     Family History:  The patient's family history includes Aneurysm in his brother; Asthma in his son; Cancer in his brother, father, and mother; Heart attack (age of onset: 17) in his son; Heart attack (age of onset: 61) in his brother; Heart attack (age of onset: 8) in his brother; Heart disease in his son; Hypertension in his son; Obesity in his son.   Review of Systems:    Please see the history of present illness.     All other systems reviewed and are otherwise negative except as noted above.   Physical Exam:    VS:  BP 136/60   Pulse 60   Wt 132 lb (59.9 kg)   SpO2 96%   BMI 20.67 kg/m    General: Pleasant elderly male appearing in no acute distress. Head: Normocephalic, atraumatic. Neck: No carotid bruits. JVD not elevated.  Lungs:  Respirations regular and unlabored, without wheezes or rales.  Heart: Regular rate and rhythm. No S3 or S4.  No murmur, no rubs, or gallops appreciated. Abdomen: Appears non-distended. No obvious abdominal masses. Msk:  Strength and tone appear normal for age. No obvious joint deformities or effusions. Tender to palpation along left shoulder.  Extremities: No clubbing or cyanosis. No pitting edema.  Distal pedal pulses are 2+ bilaterally. Neuro: Alert and oriented X 3. Moves all extremities spontaneously. No focal deficits noted. Psych:  Responds to questions appropriately with a normal affect. Skin:  No rashes or lesions noted  Wt Readings from Last 3 Encounters:  01/24/21 132 lb (59.9 kg)  12/06/20 135 lb (61.2 kg)  10/17/20 133 lb (60.3 kg)     Studies/Labs Reviewed:   EKG:  EKG is not ordered today.   Recent Labs: 08/09/2020: ALT 8; BUN 24; Creatinine, Ser 1.07; Hemoglobin 13.5; Platelets 157; Potassium 4.5; Sodium 143   Lipid Panel    Component Value Date/Time   CHOL 210 (H) 08/09/2020 1258   CHOL 197 08/04/2012 1151   TRIG 143 08/09/2020 1258   TRIG 191 (H) 10/21/2015 1048   TRIG 162 (H) 08/04/2012 1151   HDL 48 08/09/2020 1258   HDL 46 10/21/2015 1048   HDL 45 08/04/2012 1151   CHOLHDL 4.4 08/09/2020 1258   LDLCALC 136 (H) 08/09/2020 1258   LDLCALC 154 (H) 12/05/2013 0920   LDLCALC 120 (H) 08/04/2012 1151    Additional studies/ records that were reviewed today include:   Echocardiogram: 12/09/2020 IMPRESSIONS     1. Left ventricular ejection fraction, by estimation, is 40 to 45%. The  left ventricle has mildly decreased function. The left ventricle  demonstrates global hypokinesis. Left ventricular diastolic parameters are  consistent with Grade I diastolic  dysfunction (impaired relaxation).   2. Right ventricular systolic function is normal. The right ventricular  size is normal. Tricuspid regurgitation signal is inadequate for assessing  PA pressure.   3.  The mitral valve is grossly normal. Mild mitral valve regurgitation.   4. The aortic valve is tricuspid. There is mild calcification of the  aortic valve. Aortic valve regurgitation is mild.   5. The inferior vena cava is normal in size with greater than 50%  respiratory variability, suggesting right atrial pressure of 3 mmHg.   Comparison(s): No prior Echocardiogram.   Assessment:    1. Chest pain of uncertain etiology   2. Chronic systolic heart failure ([place])   3. Bilateral carotid artery stenosis   4. Mixed hyperlipidemia      Plan:   In order of problems listed above:  1. Chest Pain with Mixed Features - He denies any recent chest pain and his main concern is pain along his left shoulder and this seems most consistent with MSK pain given it is worse with movement, reproducible and improves with heat. He was previously followed by Orthopedics and we discussed possible follow-up with them to see if he would benefit from an injection.  - Continue ASA 81mg  daily and Imdur 30mg  daily since he has not requested SL NTG since being on this. Can titrate further in the future if needed.   3. HFmrEF - Recent echo showed a reduced EF of 40 to 45% with no prior images available for comparison. He has baseline dyspnea on exertion but denies any orthopnea, PND or pitting edema. Appears euvolemic on examination today.  - Reviewed echo results with the patient and his family and they prefer conservative management as previously discussed given his age and dementia and I agree with this. We did discuss medication options and they are open to this. Therefore, would start Losartan 12.5mg  daily. Recheck BMET in 2-3 weeks. Would avoid BB use given his LBBB and baseline HR in the 50's to 60's.   3. Carotid Artery Stenosis - He did have < 50% stenosis bilaterally in 12/2018. No bruit appreciated on examination today. Would not anticipate further imaging at this time as it would not change the treatment  course as he is no longer on medications  for cholesterol management and would not want surgical intervention. Continue ASA 81mg  daily.   4. HLD - He was intolerant to statins and was on Repatha two years ago but the patient and his family decided to discontinue this in the interim given his age and dementia.    Medication Adjustments/Labs and Tests Ordered: Current medicines are reviewed at length with the patient today.  Concerns regarding medicines are outlined above.  Medication changes, Labs and Tests ordered today are listed in the Patient Instructions below. Patient Instructions  Medication Instructions:  Your physician has recommended you make the following change in your medication:  Start Losartan 12.5 mg tablets daily   *If you need a refill on your cardiac medications before your next appointment, please call your pharmacy*   Lab Work: BMET 2-3 weeks If you have labs (blood work) drawn today and your tests are completely normal, you will receive your results only by: [place] (if you have MyChart) OR A paper copy in the mail If you have any lab test that is abnormal or we need to change your treatment, we will call you to review the results.   Testing/Procedures: None   Follow-Up: At [facility], you and your health needs are our priority.  As part of our continuing mission to provide you with exceptional heart care, we have created designated Provider Care Teams.  These Care Teams include your primary Cardiologist (physician) and Advanced Practice Providers (APPs -  Physician Assistants and Nurse Practitioners) who all work together to provide you with the care you need, when you need it.  We recommend signing up for the patient portal called "MyChart".  Sign up information is provided on this After Visit Summary.  MyChart is used to connect with patients for Virtual Visits (Telemedicine).  Patients are able to view lab/test results, encounter notes, upcoming  appointments, etc.  Non-urgent messages can be sent to your provider as well.   To learn more about what you can do with MyChart, go to [email or web address].    Your next appointment:   Keep Scheduled follow up with Bernerd Pho, PA-C1}    Other Instructions     Signed, Erma Heritage, PA-C  01/25/2021 1:30 PM    [place] [address] [place], [place] [address] Phone: [phone] Fax: [phone]

## 2021-01-24 ENCOUNTER — Encounter: Payer: Self-pay | Admitting: Student

## 2021-01-24 ENCOUNTER — Ambulatory Visit: Payer: PPO | Admitting: Student

## 2021-01-24 ENCOUNTER — Other Ambulatory Visit: Payer: Self-pay

## 2021-01-24 VITALS — BP 136/60 | HR 60 | Wt 132.0 lb

## 2021-01-24 DIAGNOSIS — R079 Chest pain, unspecified: Secondary | ICD-10-CM | POA: Diagnosis not present

## 2021-01-24 DIAGNOSIS — I6523 Occlusion and stenosis of bilateral carotid arteries: Secondary | ICD-10-CM

## 2021-01-24 DIAGNOSIS — I5022 Chronic systolic (congestive) heart failure: Secondary | ICD-10-CM

## 2021-01-24 DIAGNOSIS — E782 Mixed hyperlipidemia: Secondary | ICD-10-CM | POA: Diagnosis not present

## 2021-01-24 MED ORDER — NITROGLYCERIN 0.4 MG SL SUBL: 0.4000 mg | SUBLINGUAL_TABLET | SUBLINGUAL | 2 refills | Status: DC | PRN

## 2021-01-24 MED ORDER — LOSARTAN POTASSIUM 25 MG PO TABS: 12.5000 mg | ORAL_TABLET | Freq: Every day | ORAL | 3 refills | Status: DC

## 2021-01-24 NOTE — Patient Instructions (Signed)
Medication Instructions:  Your physician has recommended you make the following change in your medication:  Start Losartan 12.5 mg tablets daily   *If you need a refill on your cardiac medications before your next appointment, please call your pharmacy*   Lab Work: BMET 2-3 weeks If you have labs (blood work) drawn today and your tests are completely normal, you will receive your results only by: [place] (if you have MyChart) OR A paper copy in the mail If you have any lab test that is abnormal or we need to change your treatment, we will call you to review the results.   Testing/Procedures: None   Follow-Up: At [facility], you and your health needs are our priority.  As part of our continuing mission to provide you with exceptional heart care, we have created designated Provider Care Teams.  These Care Teams include your primary Cardiologist (physician) and Advanced Practice Providers (APPs -  Physician Assistants and Nurse Practitioners) who all work together to provide you with the care you need, when you need it.  We recommend signing up for the patient portal called "MyChart".  Sign up information is provided on this After Visit Summary.  MyChart is used to connect with patients for Virtual Visits (Telemedicine).  Patients are able to view lab/test results, encounter notes, upcoming appointments, etc.  Non-urgent messages can be sent to your provider as well.   To learn more about what you can do with MyChart, go to [email or web address].    Your next appointment:   Keep Scheduled follow up with Bernerd Pho, PA-C1}    Other Instructions

## 2021-01-25 ENCOUNTER — Encounter: Payer: Self-pay | Admitting: Student

## 2021-02-11 ENCOUNTER — Encounter: Payer: Self-pay | Admitting: Nurse Practitioner

## 2021-02-11 ENCOUNTER — Ambulatory Visit (INDEPENDENT_AMBULATORY_CARE_PROVIDER_SITE_OTHER): Payer: PPO | Admitting: Nurse Practitioner

## 2021-02-11 VITALS — BP 118/57 | HR 73 | Temp 97.6°F | Resp 20 | Ht 67.0 in | Wt 131.0 lb

## 2021-02-11 DIAGNOSIS — F5101 Primary insomnia: Secondary | ICD-10-CM

## 2021-02-11 DIAGNOSIS — E559 Vitamin D deficiency, unspecified: Secondary | ICD-10-CM

## 2021-02-11 DIAGNOSIS — G629 Polyneuropathy, unspecified: Secondary | ICD-10-CM | POA: Diagnosis not present

## 2021-02-11 DIAGNOSIS — I700 Atherosclerosis of aorta: Secondary | ICD-10-CM | POA: Diagnosis not present

## 2021-02-11 DIAGNOSIS — E538 Deficiency of other specified B group vitamins: Secondary | ICD-10-CM | POA: Diagnosis not present

## 2021-02-11 DIAGNOSIS — R413 Other amnesia: Secondary | ICD-10-CM

## 2021-02-11 DIAGNOSIS — G252 Other specified forms of tremor: Secondary | ICD-10-CM

## 2021-02-11 DIAGNOSIS — N400 Enlarged prostate without lower urinary tract symptoms: Secondary | ICD-10-CM | POA: Diagnosis not present

## 2021-02-11 DIAGNOSIS — E785 Hyperlipidemia, unspecified: Secondary | ICD-10-CM

## 2021-02-11 DIAGNOSIS — M549 Dorsalgia, unspecified: Secondary | ICD-10-CM

## 2021-02-11 DIAGNOSIS — J449 Chronic obstructive pulmonary disease, unspecified: Secondary | ICD-10-CM | POA: Diagnosis not present

## 2021-02-11 DIAGNOSIS — K219 Gastro-esophageal reflux disease without esophagitis: Secondary | ICD-10-CM | POA: Diagnosis not present

## 2021-02-11 DIAGNOSIS — I10 Essential (primary) hypertension: Secondary | ICD-10-CM

## 2021-02-11 MED ORDER — MEMANTINE HCL 5 MG PO TABS: 5.0000 mg | ORAL_TABLET | Freq: Three times a day (TID) | ORAL | 1 refills | Status: DC

## 2021-02-11 MED ORDER — FINASTERIDE 5 MG PO TABS: 5.0000 mg | ORAL_TABLET | Freq: Every day | ORAL | 1 refills | Status: DC

## 2021-02-11 MED ORDER — PANTOPRAZOLE SODIUM 40 MG PO TBEC: 40.0000 mg | DELAYED_RELEASE_TABLET | Freq: Every day | ORAL | 5 refills | Status: DC

## 2021-02-11 MED ORDER — LOSARTAN POTASSIUM 25 MG PO TABS: 12.5000 mg | ORAL_TABLET | Freq: Every day | ORAL | 3 refills | Status: DC

## 2021-02-11 MED ORDER — TERAZOSIN HCL 5 MG PO CAPS: ORAL_CAPSULE | ORAL | 1 refills | Status: DC

## 2021-02-11 MED ORDER — DONEPEZIL HCL 5 MG PO TABS: 5.0000 mg | ORAL_TABLET | Freq: Every day | ORAL | 1 refills | Status: DC

## 2021-02-11 MED ORDER — LORAZEPAM 0.5 MG PO TABS: ORAL_TABLET | ORAL | 5 refills | Status: DC

## 2021-02-11 MED ORDER — FLUTICASONE-UMECLIDIN-VILANT 100-62.5-25 MCG/ACT IN AEPB: 1.0000 | INHALATION_SPRAY | Freq: Every day | RESPIRATORY_TRACT | 11 refills | Status: DC

## 2021-02-11 NOTE — Patient Instructions (Signed)

## 2021-02-11 NOTE — Progress Notes (Signed)
 Subjective:    Patient ID: Carlos Brooks, male    DOB: 03/11/1926, 87 y.o.   MRN: [id number]    Chief Complaint: medical management of chronic issues     HPI:  1. Thoracic aorta atherosclerosis (HCC) 2. Aortic atherosclerosis (HCC) Patient saw cardiology on 12/06/20. He was having some chest pain at the time. EKG was NSR. They did echo cardiogram and that showed slight weaking of the hearst. They started him on Imdur. And he has been doing well. He is to follow up with them in May.  3. COPD (chronic obstructive pulmonary disease) with chronic bronchitis (HCC) He uses his trelegy daily and is doing well. He is not very active.  4. Gastroesophageal reflux disease without esophagitis Is on protonix daily for GERD_ works well to keep symptoms under control.  5. Neuropathy Has numbness and tingling in bil feet mainly form his chronic back pain.   6. Hyperlipidemia LDL goal <100 Does not have a very big appetite.is not able to do much exercise. Lab Results  Component Value Date   CHOL 210 (H) 08/09/2020   HDL 48 08/09/2020   LDLCALC 136 (H) 08/09/2020   TRIG 143 08/09/2020   CHOLHDL 4.4 08/09/2020     7. Memory loss Is on namenda and aricept and is maintaining at this point.  8. Primary insomnia Takes ativan at night to sleep and that seems to work well for him.  9. Vitamin B12 deficiency Is on b12 injestions monthly Lab Results  Component Value Date   VITAMINB12 368 11/10/2019     10. Vitamin D deficiency Is on daily vitamin d supplement  11. Tremor, coarse Has tremors, but doe snot seem to be any worse  12. Benign prostatic hyperplasia without lower urinary tract symptoms No problems voiding  13. Back pain, unspecified back location, unspecified back pain laterality, unspecified chronicity Just takes motrin or tylenol for his back pain when he needs it. Rest helps sometimes.    Outpatient Encounter Medications as of 02/11/2021  Medication Sig    aspirin 81 MG EC tablet Take 1 tablet (81 mg total) by mouth daily.   donepezil (ARICEPT) 5 MG tablet TAKE ONE TABLET AT BEDTIME   finasteride (PROSCAR) 5 MG tablet Take 1 tablet (5 mg total) by mouth daily.   fluticasone (FLONASE) 50 MCG/ACT nasal spray Place 2 sprays into both nostrils daily.   Fluticasone-Umeclidin-Vilant (TRELEGY ELLIPTA) 100-62.5-25 MCG/INH AEPB Inhale 1 puff into the lungs daily.   guaiFENesin (MUCINEX) 600 MG 12 hr tablet Take 2 tablets (1,200 mg total) by mouth 2 (two) times daily as needed.   isosorbide mononitrate (IMDUR) 30 MG 24 hr tablet Take 1 tablet (30 mg total) by mouth daily.   levocetirizine (XYZAL) 5 MG tablet TAKE 1/2 TABLET ONCE DAILY AS NEEDED   LORazepam (ATIVAN) 0.5 MG tablet Take nightly as needed for sleep   losartan (COZAAR) 25 MG tablet Take 0.5 tablets (12.5 mg total) by mouth daily.   memantine (NAMENDA) 5 MG tablet Take 1 tablet (5 mg total) by mouth in the morning, at noon, and at bedtime.   nitroGLYCERIN (NITROSTAT) 0.4 MG SL tablet Place 1 tablet (0.4 mg total) under the tongue every 5 (five) minutes as needed for chest pain.   OVER THE COUNTER MEDICATION Vit b 12 1000 IM Q month (Patient not taking: Reported on 01/24/2021)   pantoprazole (PROTONIX) 40 MG tablet Take 1 tablet (40 mg total) by mouth daily.   PROAIR HFA 108 (90 Base)   MCG/ACT inhaler 2 PUFFS EVERY 6 HOURS AS NEEDED FOR WHEEZING OR SHORTNESS OF BREATH   terazosin (HYTRIN) 5 MG capsule TAKE (1) CAPSULE DAILY   ursodiol (ACTIGALL) 300 MG capsule Take 1 capsule (300 mg total) by mouth 3 (three) times daily.   Facility-Administered Encounter Medications as of 02/11/2021  Medication   cyanocobalamin ((VITAMIN B-12)) injection 1,000 mcg    Past Surgical History:  Procedure Laterality Date   EYE SURGERY     HERNIA REPAIR     x 3, inguinal    skin cancer removal     head    Family History  Problem Relation Age of Onset   Cancer Mother        ovairian   Cancer Father         lung   Heart attack Brother 36       Not clear details   Heart attack Son 47       Died with an MI   Heart disease Son    Cancer Brother        liver   Heart attack Brother 69   Aneurysm Brother    Hypertension Son    Obesity Son    Asthma Son     New complaints: None today  Social history: Lives with his wife. His daughter checks on them daily  Controlled substance contract: n/a     Review of Systems  Constitutional:  Negative for diaphoresis.  Eyes:  Negative for pain.  Respiratory:  Negative for shortness of breath.   Cardiovascular:  Negative for chest pain, palpitations and leg swelling.  Gastrointestinal:  Negative for abdominal pain.  Endocrine: Negative for polydipsia.  Skin:  Negative for rash.  Neurological:  Negative for dizziness, weakness and headaches.  Hematological:  Does not bruise/bleed easily.  All other systems reviewed and are negative.     Objective:   Physical Exam Vitals and nursing note reviewed.  Constitutional:      Appearance: Normal appearance. He is well-developed.  HENT:     Head: Normocephalic.     Nose: Nose normal.     Mouth/Throat:     Mouth: Mucous membranes are moist.     Pharynx: Oropharynx is clear.  Eyes:     Pupils: Pupils are equal, round, and reactive to light.  Neck:     Thyroid: No thyroid mass or thyromegaly.     Vascular: No carotid bruit or JVD.     Trachea: Phonation normal.  Cardiovascular:     Rate and Rhythm: Normal rate and regular rhythm.  Pulmonary:     Effort: Pulmonary effort is normal. No respiratory distress.     Breath sounds: Normal breath sounds.  Abdominal:     General: Bowel sounds are normal.     Palpations: Abdomen is soft.     Tenderness: There is no abdominal tenderness.  Musculoskeletal:        General: Normal range of motion.     Cervical back: Normal range of motion and neck supple.  Lymphadenopathy:     Cervical: No cervical adenopathy.  Skin:    General: Skin is warm and  dry.  Neurological:     Mental Status: He is alert and oriented to person, place, and time.  Psychiatric:        Behavior: Behavior normal.        Thought Content: Thought content normal.        Judgment: Judgment normal.   BP (!) 118/57  Pulse 73   Temp 97.6 F (36.4 C) (Temporal)   Resp 20   Ht 5' 7" (1.702 m)   Wt 131 lb (59.4 kg)   SpO2 94%   BMI 20.52 kg/m         Assessment & Plan:  Carlos Brooks comes in today with chief complaint of Medical Management of Chronic Issues   Diagnosis and orders addressed:  1. Thoracic aorta atherosclerosis ([place]) 2. Aortic atherosclerosis ([place]) Keep follow up with cardiology  3. COPD (chronic obstructive pulmonary disease) with chronic bronchitis ([place]) - Fluticasone-Umeclidin-Vilant (TRELEGY ELLIPTA) 100-62.5-25 MCG/ACT AEPB; Inhale 1 puff into the lungs daily.  Dispense: 28 each; Refill: 11  4. Gastroesophageal reflux disease without esophagitis Avoid spicy foods Do not eat 2 hours prior to bedtime - pantoprazole (PROTONIX) 40 MG tablet; Take 1 tablet (40 mg total) by mouth daily.  Dispense: 30 tablet; Refill: 5  5. Neuropathy Do not go barefooted  6. Hyperlipidemia LDL goal <100 Low fat diet - CBC with Differential/Platelet - CMP14+EGFR - Lipid panel  7. Memory loss No change - donepezil (ARICEPT) 5 MG tablet; Take 1 tablet (5 mg total) by mouth at bedtime.  Dispense: 90 tablet; Refill: 1 - memantine (NAMENDA) 5 MG tablet; Take 1 tablet (5 mg total) by mouth in the morning, at noon, and at bedtime.  Dispense: 180 tablet; Refill: 1  8. Primary insomnia Bed time routine - LORazepam (ATIVAN) 0.5 MG tablet; Take nightly as needed for sleep  Dispense: 30 tablet; Refill: 5  9. Vitamin B12 deficiency Is on b12 injections monthly  10. Vitamin D deficiency Continue vitamin d   39. Tremor, coarse  12. Benign prostatic hyperplasia without lower urinary tract symptoms Report any problems voiding - finasteride  (PROSCAR) 5 MG tablet; Take 1 tablet (5 mg total) by mouth daily.  Dispense: 90 tablet; Refill: 1 - terazosin (HYTRIN) 5 MG capsule; TAKE (1) CAPSULE DAILY  Dispense: 90 capsule; Refill: 1  13. Back pain, unspecified back location, unspecified back pain laterality, unspecified chronicity Tylenol as needed  14. Primary hypertension Low sodium diet - losartan (COZAAR) 25 MG tablet; Take 0.5 tablets (12.5 mg total) by mouth daily.  Dispense: 45 tablet; Refill: 3   Labs pending Health Maintenance reviewed Diet and exercise encouraged  Follow up plan: 6 months   Mary-Margaret Hassell Done, FNP

## 2021-02-12 LAB — CBC WITH DIFFERENTIAL/PLATELET
Basophils Absolute: 0.1 10*3/uL (ref 0.0–0.2)
Basos: 1 %
EOS (ABSOLUTE): 1.1 10*3/uL — ABNORMAL HIGH (ref 0.0–0.4)
Eos: 14 %
Hematocrit: 39.6 % (ref 37.5–51.0)
Hemoglobin: 12.8 g/dL — ABNORMAL LOW (ref 13.0–17.7)
Immature Grans (Abs): 0 10*3/uL (ref 0.0–0.1)
Immature Granulocytes: 0 %
Lymphocytes Absolute: 1.5 10*3/uL (ref 0.7–3.1)
Lymphs: 20 %
MCH: 30.1 pg (ref 26.6–33.0)
MCHC: 32.3 g/dL (ref 31.5–35.7)
MCV: 93 fL (ref 79–97)
Monocytes Absolute: 0.8 10*3/uL (ref 0.1–0.9)
Monocytes: 10 %
Neutrophils Absolute: 4.2 10*3/uL (ref 1.4–7.0)
Neutrophils: 55 %
Platelets: 185 10*3/uL (ref 150–450)
RBC: 4.25 x10E6/uL (ref 4.14–5.80)
RDW: 13.1 % (ref 11.6–15.4)
WBC: 7.7 10*3/uL (ref 3.4–10.8)

## 2021-02-12 LAB — CMP14+EGFR
ALT: 11 IU/L (ref 0–44)
AST: 17 IU/L (ref 0–40)
Albumin/Globulin Ratio: 2 (ref 1.2–2.2)
Albumin: 4.4 g/dL (ref 3.5–4.6)
Alkaline Phosphatase: 75 IU/L (ref 44–121)
BUN/Creatinine Ratio: 17 (ref 10–24)
BUN: 21 mg/dL (ref 10–36)
Bilirubin Total: 0.3 mg/dL (ref 0.0–1.2)
CO2: 27 mmol/L (ref 20–29)
Calcium: 9.3 mg/dL (ref 8.6–10.2)
Chloride: 105 mmol/L (ref 96–106)
Creatinine, Ser: 1.22 mg/dL (ref 0.76–1.27)
Globulin, Total: 2.2 g/dL (ref 1.5–4.5)
Glucose: 100 mg/dL — ABNORMAL HIGH (ref 70–99)
Potassium: 4.3 mmol/L (ref 3.5–5.2)
Sodium: 143 mmol/L (ref 134–144)
Total Protein: 6.6 g/dL (ref 6.0–8.5)
eGFR: 56 mL/min/{1.73_m2} — ABNORMAL LOW (ref >59)

## 2021-02-12 LAB — LIPID PANEL
Chol/HDL Ratio: 4.4 ratio (ref 0.0–5.0)
Cholesterol, Total: 209 mg/dL — ABNORMAL HIGH (ref 100–199)
HDL: 47 mg/dL (ref >39)
LDL Chol Calc (NIH): 134 mg/dL — ABNORMAL HIGH (ref 0–99)
Triglycerides: 157 mg/dL — ABNORMAL HIGH (ref 0–149)
VLDL Cholesterol Cal: 28 mg/dL (ref 5–40)

## 2021-02-20 ENCOUNTER — Ambulatory Visit (INDEPENDENT_AMBULATORY_CARE_PROVIDER_SITE_OTHER): Payer: PPO

## 2021-02-20 DIAGNOSIS — E538 Deficiency of other specified B group vitamins: Secondary | ICD-10-CM | POA: Diagnosis not present

## 2021-02-20 NOTE — Progress Notes (Signed)
Cyanocobalamin injection given to right deltoid.  Patient tolerated well. 

## 2021-02-21 ENCOUNTER — Other Ambulatory Visit: Payer: Self-pay | Admitting: Nurse Practitioner

## 2021-02-21 DIAGNOSIS — F5101 Primary insomnia: Secondary | ICD-10-CM

## 2021-02-26 DIAGNOSIS — Z515 Encounter for palliative care: Secondary | ICD-10-CM | POA: Diagnosis not present

## 2021-02-26 DIAGNOSIS — F0150 Vascular dementia without behavioral disturbance: Secondary | ICD-10-CM | POA: Diagnosis not present

## 2021-02-26 DIAGNOSIS — J449 Chronic obstructive pulmonary disease, unspecified: Secondary | ICD-10-CM | POA: Diagnosis not present

## 2021-02-26 DIAGNOSIS — G309 Alzheimer's disease, unspecified: Secondary | ICD-10-CM | POA: Diagnosis not present

## 2021-03-13 DIAGNOSIS — L821 Other seborrheic keratosis: Secondary | ICD-10-CM | POA: Diagnosis not present

## 2021-03-13 DIAGNOSIS — K130 Diseases of lips: Secondary | ICD-10-CM | POA: Diagnosis not present

## 2021-03-13 DIAGNOSIS — D1801 Hemangioma of skin and subcutaneous tissue: Secondary | ICD-10-CM | POA: Diagnosis not present

## 2021-03-13 DIAGNOSIS — Z85828 Personal history of other malignant neoplasm of skin: Secondary | ICD-10-CM | POA: Diagnosis not present

## 2021-03-24 ENCOUNTER — Ambulatory Visit (INDEPENDENT_AMBULATORY_CARE_PROVIDER_SITE_OTHER): Payer: PPO

## 2021-03-24 DIAGNOSIS — E538 Deficiency of other specified B group vitamins: Secondary | ICD-10-CM

## 2021-03-24 NOTE — Progress Notes (Signed)
Cyanocobalamin injection given to left deltoid.  Patient tolerated well. 

## 2021-04-24 ENCOUNTER — Ambulatory Visit (INDEPENDENT_AMBULATORY_CARE_PROVIDER_SITE_OTHER): Payer: PPO

## 2021-04-24 DIAGNOSIS — E538 Deficiency of other specified B group vitamins: Secondary | ICD-10-CM

## 2021-04-24 NOTE — Progress Notes (Signed)
Cyanocobalamin injection given to right deltoid.  Patient tolerated well. 

## 2021-05-22 ENCOUNTER — Telehealth: Payer: Self-pay

## 2021-05-22 ENCOUNTER — Ambulatory Visit (INDEPENDENT_AMBULATORY_CARE_PROVIDER_SITE_OTHER): Payer: PPO

## 2021-05-22 DIAGNOSIS — E538 Deficiency of other specified B group vitamins: Secondary | ICD-10-CM | POA: Diagnosis not present

## 2021-05-22 NOTE — Telephone Encounter (Signed)
Patient would like to have his B12 level checked again.  Last lab for this was in September 2021.  Please advise. ?

## 2021-05-22 NOTE — Telephone Encounter (Signed)
We can check it at his next visit ?

## 2021-05-22 NOTE — Progress Notes (Signed)
Cyanocobalamin injection given to left deltoid.  Patient tolerated well. 

## 2021-05-23 ENCOUNTER — Other Ambulatory Visit: Payer: Self-pay

## 2021-05-23 DIAGNOSIS — E538 Deficiency of other specified B group vitamins: Secondary | ICD-10-CM

## 2021-05-23 NOTE — Telephone Encounter (Signed)
Patients daughter notified that we will place a future order and he can have labs drawn when he comes in for his next B12 shot. Daughter verbalized understanding ?

## 2021-06-05 NOTE — Progress Notes (Signed)
? ?Cardiology Office Note   ? ?Date:  06/07/2021  ? ?ID:  Carlos Brooks, DOB [birth year], MRN [id number] ? ?PCP:  [name], FNP  ?Cardiologist: Minus Breeding, MD   ? ?Chief Complaint  ?Patient presents with  ? Follow-up  ?  6 month visit  ? ? ?History of Present Illness:   ? ?Carlos Brooks is a 88 y.o. male  with past medical history of carotid artery stenosis, HFmrEF (EF 40-45% by echo in 12/2020), LBBB, OSA, HLD, dementia and GERD who presents to the office today for 37-monthfollow-up. ? ?He was last examined by myself for follow-up from his recent echocardiogram which showed that his EF was mildly reduced at 40 to 45% with global hypokinesis. He reported occasional pain along his left shoulder which would improve with heat and denied any specific chest pain or dyspnea on exertion. He was continued on ASA and Imdur and was started on Losartan 12.5 mg daily for his cardiomyopathy. He was not started on a beta-blocker given his LBBB and baseline heart rate in the 50's along with not being started on an SGLT2 inhibitor given his variable PO intake. ? ?In talking with the patient, his wife and his daughter today, most history is provided by family secondary to his dementia. They report that he still has intermittent episodes of chest pain and was having several episodes last week which resolved with nitroglycerin. He has overall felt well this week and has not requested any. No reported dyspnea on exertion, orthopnea, PND or pitting edema. He is still physically active around the house and likes to help cook and clean but has short-term memory loss in the setting of his dementia.  ? ? ?Past Medical History:  ?Diagnosis Date  ? BPH (benign prostatic hyperplasia)   ? Cancer ([facility]   ? skin  ? Cataract   ? COPD (chronic obstructive pulmonary disease) ([place]   ? Generalized headaches   ? GERD (gastroesophageal reflux disease)   ? Hiatal hernia   ? Hyperlipidemia   ? ? ?Past Surgical History:  ?Procedure  Laterality Date  ? EYE SURGERY    ? HERNIA REPAIR    ? x 3, inguinal   ? skin cancer removal    ? head  ? ? ?Current Medications: ?Outpatient Medications Prior to Visit  ?Medication Sig Dispense Refill  ? aspirin 81 MG EC tablet Take 1 tablet (81 mg total) by mouth daily. 30 tablet 12  ? donepezil (ARICEPT) 5 MG tablet Take 1 tablet (5 mg total) by mouth at bedtime. 90 tablet 1  ? finasteride (PROSCAR) 5 MG tablet Take 1 tablet (5 mg total) by mouth daily. 90 tablet 1  ? fluticasone (FLONASE) 50 MCG/ACT nasal spray Place 2 sprays into both nostrils daily. 16 g 6  ? Fluticasone-Umeclidin-Vilant (TRELEGY ELLIPTA) 100-62.5-25 MCG/ACT AEPB Inhale 1 puff into the lungs daily. 28 each 11  ? guaiFENesin (MUCINEX) 600 MG 12 hr tablet Take 2 tablets (1,200 mg total) by mouth 2 (two) times daily as needed.    ? levocetirizine (XYZAL) 5 MG tablet TAKE 1/2 TABLET ONCE DAILY AS NEEDED 30 tablet 5  ? LORazepam (ATIVAN) 0.5 MG tablet Take nightly as needed for sleep 30 tablet 5  ? losartan (COZAAR) 25 MG tablet Take 0.5 tablets (12.5 mg total) by mouth daily. 45 tablet 3  ? memantine (NAMENDA) 5 MG tablet Take 1 tablet (5 mg total) by mouth in the morning, at noon, and at bedtime. 180 tablet 1  ?  nitroGLYCERIN (NITROSTAT) 0.4 MG SL tablet Place 1 tablet (0.4 mg total) under the tongue every 5 (five) minutes as needed for chest pain. 25 tablet 2  ? OVER THE COUNTER MEDICATION Vit b 12 1000 IM Q month    ? pantoprazole (PROTONIX) 40 MG tablet Take 1 tablet (40 mg total) by mouth daily. 30 tablet 5  ? PROAIR HFA 108 (90 Base) MCG/ACT inhaler 2 PUFFS EVERY 6 HOURS AS NEEDED FOR WHEEZING OR SHORTNESS OF BREATH 8.5 g 11  ? terazosin (HYTRIN) 5 MG capsule TAKE (1) CAPSULE DAILY 90 capsule 1  ? ursodiol (ACTIGALL) 300 MG capsule Take 1 capsule (300 mg total) by mouth 3 (three) times daily. 90 capsule 11  ? isosorbide mononitrate (IMDUR) 30 MG 24 hr tablet Take 1 tablet (30 mg total) by mouth daily. 90 tablet 3  ? ?Facility-Administered  Medications Prior to Visit  ?Medication Dose Route Frequency Provider Last Rate Last Admin  ? cyanocobalamin ((VITAMIN B-12)) injection 1,000 mcg  1,000 mcg Intramuscular Q30 days [name], FNP   1,000 mcg at 05/22/21 0935  ?  ? ?Allergies:   Clarithromycin, Crestor [rosuvastatin calcium], Lipitor [atorvastatin calcium], Niaspan [niacin er], Pneumovax [pneumococcal polysaccharide vaccine], Pravachol, Ranitidine hcl, and Simvastatin  ? ?Social History  ? ?Socioeconomic History  ? Marital status: Married  ?  Spouse name: Not on file  ? Number of children: 5  ? Years of education: Not on file  ? Highest education level: 8th grade  ?Occupational History  ? Occupation: Retired  ?Tobacco Use  ? Smoking status: Former  ?  Types: Cigarettes  ?  Quit date: 04/13/1993  ?  Years since quitting: 28.1  ? Smokeless tobacco: Never  ?Vaping Use  ? Vaping Use: Never used  ?Substance and Sexual Activity  ? Alcohol use: No  ? Drug use: No  ? Sexual activity: Not Currently  ?Other Topics Concern  ? Not on file  ?Social History Narrative  ? Not on file  ? ?Social Determinants of Health  ? ?Financial Resource Strain: Not on file  ?Food Insecurity: Not on file  ?Transportation Needs: Not on file  ?Physical Activity: Not on file  ?Stress: Not on file  ?Social Connections: Not on file  ?  ? ?Family History:  The patient's family history includes Aneurysm in his brother; Asthma in his son; Cancer in his brother, father, and mother; Heart attack (age of onset: 25) in his son; Heart attack (age of onset: 22) in his brother; Heart attack (age of onset: 6) in his brother; Heart disease in his son; Hypertension in his son; Obesity in his son.  ? ?Review of Systems:   ? ?Please see the history of present illness.    ? ?All other systems reviewed and are otherwise negative except as noted above. ? ? ?Physical Exam:   ? ?VS:  BP (!) 116/50   Pulse (!) 54   Ht '5\' 7"'$  (1.702 m)   Wt 133 lb (60.3 kg)   SpO2 97%   BMI 20.83 kg/m?     ?General: Pleasant elderly male appearing in no acute distress. ?Head: Normocephalic, atraumatic. ?Neck: No carotid bruits. JVD not elevated.  ?Lungs: Respirations regular and unlabored, without wheezes or rales.  ?Heart: Regular rate and rhythm. No S3 or S4.  No murmur, no rubs, or gallops appreciated. ?Abdomen: Appears non-distended. No obvious abdominal masses. ?Msk:  Strength and tone appear normal for age. No obvious joint deformities or effusions. ?Extremities: No clubbing or cyanosis. No  pitting edema.  Distal pedal pulses are 2+ bilaterally. ?Neuro: Alert and oriented X 3. Moves all extremities spontaneously. No focal deficits noted. ?Psych:  Responds to questions appropriately with a normal affect. ?Skin: No rashes or lesions noted ? ?Wt Readings from Last 3 Encounters:  ?06/06/21 133 lb (60.3 kg)  ?02/11/21 131 lb (59.4 kg)  ?01/24/21 132 lb (59.9 kg)  ?  ? ?Studies/Labs Reviewed:  ? ?EKG:  EKG is not ordered today.   ? ?Recent Labs: ?02/11/2021: ALT 11; BUN 21; Creatinine, Ser 1.22; Hemoglobin 12.8; Platelets 185; Potassium 4.3; Sodium 143  ? ?Lipid Panel ?   ?Component Value Date/Time  ? CHOL 209 (H) 02/11/2021 1233  ? CHOL 197 08/04/2012 1151  ? TRIG 157 (H) 02/11/2021 1233  ? TRIG 191 (H) 10/21/2015 1048  ? TRIG 162 (H) 08/04/2012 1151  ? HDL 47 02/11/2021 1233  ? HDL 46 10/21/2015 1048  ? HDL 45 08/04/2012 1151  ? CHOLHDL 4.4 02/11/2021 1233  ? LDLCALC 134 (H) 02/11/2021 1233  ? [place] 154 (H) 12/05/2013 0920  ? LDLCALC 120 (H) 08/04/2012 1151  ? ? ?Additional studies/ records that were reviewed today include:  ? ?Echocardiogram: 12/2020 ?IMPRESSIONS  ? ? ? 1. Left ventricular ejection fraction, by estimation, is 40 to 45%. The  ?left ventricle has mildly decreased function. The left ventricle  ?demonstrates global hypokinesis. Left ventricular diastolic parameters are  ?consistent with Grade I diastolic  ?dysfunction (impaired relaxation).  ? 2. Right ventricular systolic function is normal. The  right ventricular  ?size is normal. Tricuspid regurgitation signal is inadequate for assessing  ?PA pressure.  ? 3. The mitral valve is grossly normal. Mild mitral valve regurgitation.  ? 4. The aortic valve is tricu

## 2021-06-06 ENCOUNTER — Encounter: Payer: Self-pay | Admitting: Student

## 2021-06-06 ENCOUNTER — Ambulatory Visit: Payer: PPO | Admitting: Student

## 2021-06-06 VITALS — BP 116/50 | HR 54 | Ht 67.0 in | Wt 133.0 lb

## 2021-06-06 DIAGNOSIS — R079 Chest pain, unspecified: Secondary | ICD-10-CM | POA: Diagnosis not present

## 2021-06-06 DIAGNOSIS — I429 Cardiomyopathy, unspecified: Secondary | ICD-10-CM

## 2021-06-06 DIAGNOSIS — I6523 Occlusion and stenosis of bilateral carotid arteries: Secondary | ICD-10-CM

## 2021-06-06 MED ORDER — ISOSORBIDE MONONITRATE ER 60 MG PO TB24: 60.0000 mg | ORAL_TABLET | Freq: Every day | ORAL | 3 refills | Status: DC

## 2021-06-06 NOTE — Patient Instructions (Signed)
Medication Instructions:  ?INCREASE Imdur to 60 mg daily ? ?Labwork: ?None today ? ?Testing/Procedures: ?None today ? ?Follow-Up: ?6 months ? ?Any Other Special Instructions Will Be Listed Below (If Applicable). ? ?If you need a refill on your cardiac medications before your next appointment, please call your pharmacy. ? ?

## 2021-06-07 ENCOUNTER — Encounter: Payer: Self-pay | Admitting: Student

## 2021-06-12 DIAGNOSIS — H40033 Anatomical narrow angle, bilateral: Secondary | ICD-10-CM | POA: Diagnosis not present

## 2021-06-12 DIAGNOSIS — H04123 Dry eye syndrome of bilateral lacrimal glands: Secondary | ICD-10-CM | POA: Diagnosis not present

## 2021-06-23 ENCOUNTER — Ambulatory Visit (INDEPENDENT_AMBULATORY_CARE_PROVIDER_SITE_OTHER): Payer: PPO | Admitting: Family Medicine

## 2021-06-23 ENCOUNTER — Other Ambulatory Visit: Payer: Self-pay | Admitting: Nurse Practitioner

## 2021-06-23 DIAGNOSIS — E538 Deficiency of other specified B group vitamins: Secondary | ICD-10-CM | POA: Diagnosis not present

## 2021-06-23 MED ORDER — DONEPEZIL HCL 10 MG PO TABS: 10.0000 mg | ORAL_TABLET | Freq: Every day | ORAL | 1 refills | Status: DC

## 2021-06-24 LAB — VITAMIN B12: Vitamin B-12: 445 pg/mL (ref 232–1245)

## 2021-06-25 DIAGNOSIS — J449 Chronic obstructive pulmonary disease, unspecified: Secondary | ICD-10-CM | POA: Diagnosis not present

## 2021-06-25 DIAGNOSIS — Z515 Encounter for palliative care: Secondary | ICD-10-CM | POA: Diagnosis not present

## 2021-06-25 DIAGNOSIS — G309 Alzheimer's disease, unspecified: Secondary | ICD-10-CM | POA: Diagnosis not present

## 2021-06-25 DIAGNOSIS — F0150 Vascular dementia without behavioral disturbance: Secondary | ICD-10-CM | POA: Diagnosis not present

## 2021-07-24 ENCOUNTER — Ambulatory Visit (INDEPENDENT_AMBULATORY_CARE_PROVIDER_SITE_OTHER): Payer: PPO | Admitting: Emergency Medicine

## 2021-07-24 DIAGNOSIS — E538 Deficiency of other specified B group vitamins: Secondary | ICD-10-CM | POA: Diagnosis not present

## 2021-08-12 ENCOUNTER — Encounter: Payer: Self-pay | Admitting: Nurse Practitioner

## 2021-08-12 ENCOUNTER — Ambulatory Visit (INDEPENDENT_AMBULATORY_CARE_PROVIDER_SITE_OTHER): Payer: PPO | Admitting: Nurse Practitioner

## 2021-08-12 VITALS — BP 107/39 | HR 59 | Temp 97.6°F | Ht 67.0 in | Wt 132.2 lb

## 2021-08-12 DIAGNOSIS — E559 Vitamin D deficiency, unspecified: Secondary | ICD-10-CM

## 2021-08-12 DIAGNOSIS — R1319 Other dysphagia: Secondary | ICD-10-CM | POA: Diagnosis not present

## 2021-08-12 DIAGNOSIS — M542 Cervicalgia: Secondary | ICD-10-CM | POA: Diagnosis not present

## 2021-08-12 DIAGNOSIS — N400 Enlarged prostate without lower urinary tract symptoms: Secondary | ICD-10-CM | POA: Diagnosis not present

## 2021-08-12 DIAGNOSIS — R413 Other amnesia: Secondary | ICD-10-CM

## 2021-08-12 DIAGNOSIS — J449 Chronic obstructive pulmonary disease, unspecified: Secondary | ICD-10-CM | POA: Diagnosis not present

## 2021-08-12 DIAGNOSIS — I700 Atherosclerosis of aorta: Secondary | ICD-10-CM | POA: Diagnosis not present

## 2021-08-12 DIAGNOSIS — F5101 Primary insomnia: Secondary | ICD-10-CM | POA: Diagnosis not present

## 2021-08-12 DIAGNOSIS — E538 Deficiency of other specified B group vitamins: Secondary | ICD-10-CM

## 2021-08-12 DIAGNOSIS — I10 Essential (primary) hypertension: Secondary | ICD-10-CM

## 2021-08-12 DIAGNOSIS — I447 Left bundle-branch block, unspecified: Secondary | ICD-10-CM

## 2021-08-12 DIAGNOSIS — M549 Dorsalgia, unspecified: Secondary | ICD-10-CM

## 2021-08-12 DIAGNOSIS — K819 Cholecystitis, unspecified: Secondary | ICD-10-CM

## 2021-08-12 DIAGNOSIS — E785 Hyperlipidemia, unspecified: Secondary | ICD-10-CM

## 2021-08-12 DIAGNOSIS — G629 Polyneuropathy, unspecified: Secondary | ICD-10-CM

## 2021-08-12 DIAGNOSIS — G252 Other specified forms of tremor: Secondary | ICD-10-CM

## 2021-08-12 DIAGNOSIS — R29898 Other symptoms and signs involving the musculoskeletal system: Secondary | ICD-10-CM

## 2021-08-12 DIAGNOSIS — K219 Gastro-esophageal reflux disease without esophagitis: Secondary | ICD-10-CM

## 2021-08-12 MED ORDER — MEMANTINE HCL 5 MG PO TABS: 5.0000 mg | ORAL_TABLET | Freq: Three times a day (TID) | ORAL | 1 refills | Status: DC

## 2021-08-12 MED ORDER — LOSARTAN POTASSIUM 25 MG PO TABS: 12.5000 mg | ORAL_TABLET | Freq: Every day | ORAL | 3 refills | Status: DC

## 2021-08-12 MED ORDER — DONEPEZIL HCL 10 MG PO TABS: 10.0000 mg | ORAL_TABLET | Freq: Every day | ORAL | 1 refills | Status: DC

## 2021-08-12 MED ORDER — URSODIOL 300 MG PO CAPS: 300.0000 mg | ORAL_CAPSULE | Freq: Three times a day (TID) | ORAL | 11 refills | Status: DC

## 2021-08-12 MED ORDER — PANTOPRAZOLE SODIUM 40 MG PO TBEC: 40.0000 mg | DELAYED_RELEASE_TABLET | Freq: Every day | ORAL | 5 refills | Status: DC

## 2021-08-12 MED ORDER — TERAZOSIN HCL 5 MG PO CAPS: ORAL_CAPSULE | ORAL | 1 refills | Status: DC

## 2021-08-12 MED ORDER — FINASTERIDE 5 MG PO TABS: 5.0000 mg | ORAL_TABLET | Freq: Every day | ORAL | 1 refills | Status: DC

## 2021-08-12 MED ORDER — LORAZEPAM 0.5 MG PO TABS: ORAL_TABLET | ORAL | 5 refills | Status: DC

## 2021-08-12 MED ORDER — FLUTICASONE-UMECLIDIN-VILANT 100-62.5-25 MCG/ACT IN AEPB: 1.0000 | INHALATION_SPRAY | Freq: Every day | RESPIRATORY_TRACT | 11 refills | Status: DC

## 2021-08-12 NOTE — Patient Instructions (Signed)

## 2021-08-12 NOTE — Progress Notes (Signed)
Subjective:    Patient ID: Carlos Brooks, male    DOB: Oct 23, 1926, 88 y.o.   MRN: [id number]   Chief Complaint: No chief complaint on file.    HPI:  Carlos Brooks is a 88 y.o. who identifies as a male who was assigned male at birth.   Social history: Lives with: wife- daughter check on them daily Work history: retired from [employer] in today for follow up of the following chronic medical issues:  1. LBBB (left bundle branch block) 2. Aortic atherosclerosis ([place]) 3. Thoracic aorta atherosclerosis ([place]) Last saw cardiology on 06/06/21. He was having chest pain intermittetly prior to appointment. They decided to do conservative therapy. He was given  nitroglycerin to take as needed and his imdur was increased to '60mg'$  daily  4. COPD (chronic obstructive pulmonary disease) with chronic bronchitis ([place]) He has occasional SOB with activity but thinks is heart related. Denies cough and /or wheezing. Uses his trelegy daily  5. Gastroesophageal reflux disease without esophagitis Is on protonix daily and has no reflux symptoms as long as he takes his meds.  6. Esophageal dysphagia No recent swallowing issues  7. Hyperlipidemia LDL goal <100 Eats whatever family brings them to eat. No longer on statin or repatha Lab Results  Component Value Date   CHOL 209 (H) 02/11/2021   HDL 47 02/11/2021   LDLCALC 134 (H) 02/11/2021   TRIG 157 (H) 02/11/2021   CHOLHDL 4.4 02/11/2021     8. Back pain, unspecified back location, unspecified back pain laterality, unspecified chronicity 9. Neck pain Has chronic back and neck pain. Just takes tylenol as needed.  10. Benign prostatic hyperplasia without lower urinary tract symptoms No voiding issues. takes hytrin and proscar daily.  11. Primary insomnia Takes ativan at bedtime to sleep. Sleeps about 6-7 hours a night  12. Tremor, coarse No better and no worse  13. Vitamin D deficiency Is on daly vitamin d supplement  14.  Vitamin B12 deficiency Use to be on b12 injections but is no longer taking.  15. Weakness of right leg Has not had any falls  16. Neuropathy Burning in bil feet   New complaints: None today  Allergies  Allergen Reactions   Clarithromycin    Crestor [Rosuvastatin Calcium]    Lipitor [Atorvastatin Calcium]    Niaspan [Niacin Er]    Pneumovax [Pneumococcal Polysaccharide Vaccine]     Arm swelling and rash   Pravachol    Ranitidine Hcl    Simvastatin    Outpatient Encounter Medications as of 08/12/2021  Medication Sig   aspirin 81 MG EC tablet Take 1 tablet (81 mg total) by mouth daily.   donepezil (ARICEPT) 10 MG tablet Take 1 tablet (10 mg total) by mouth at bedtime.   finasteride (PROSCAR) 5 MG tablet Take 1 tablet (5 mg total) by mouth daily.   fluticasone (FLONASE) 50 MCG/ACT nasal spray Place 2 sprays into both nostrils daily.   Fluticasone-Umeclidin-Vilant (TRELEGY ELLIPTA) 100-62.5-25 MCG/ACT AEPB Inhale 1 puff into the lungs daily.   guaiFENesin (MUCINEX) 600 MG 12 hr tablet Take 2 tablets (1,200 mg total) by mouth 2 (two) times daily as needed.   isosorbide mononitrate (IMDUR) 60 MG 24 hr tablet Take 1 tablet (60 mg total) by mouth daily.   levocetirizine (XYZAL) 5 MG tablet TAKE 1/2 TABLET ONCE DAILY AS NEEDED   LORazepam (ATIVAN) 0.5 MG tablet Take nightly as needed for sleep   losartan (COZAAR) 25 MG tablet Take  0.5 tablets (12.5 mg total) by mouth daily.   memantine (NAMENDA) 5 MG tablet Take 1 tablet (5 mg total) by mouth in the morning, at noon, and at bedtime.   nitroGLYCERIN (NITROSTAT) 0.4 MG SL tablet Place 1 tablet (0.4 mg total) under the tongue every 5 (five) minutes as needed for chest pain.   OVER THE COUNTER MEDICATION Vit b 12 1000 IM Q month   pantoprazole (PROTONIX) 40 MG tablet Take 1 tablet (40 mg total) by mouth daily.   PROAIR HFA 108 (90 Base) MCG/ACT inhaler 2 PUFFS EVERY 6 HOURS AS NEEDED FOR WHEEZING OR SHORTNESS OF BREATH   terazosin (HYTRIN)  5 MG capsule TAKE (1) CAPSULE DAILY   ursodiol (ACTIGALL) 300 MG capsule Take 1 capsule (300 mg total) by mouth 3 (three) times daily.   Facility-Administered Encounter Medications as of 08/12/2021  Medication   cyanocobalamin ((VITAMIN B-12)) injection 1,000 mcg    Past Surgical History:  Procedure Laterality Date   EYE SURGERY     HERNIA REPAIR     x 3, inguinal    skin cancer removal     head    Family History  Problem Relation Age of Onset   Cancer Mother        ovairian   Cancer Father        lung   Heart attack Brother 82       Not clear details   Heart attack Son 65       Died with an MI   Heart disease Son    Cancer Brother        liver   Heart attack Brother 71   Aneurysm Brother    Hypertension Son    Obesity Son    Asthma Son       Controlled substance contract: 02/12/21     Review of Systems  Constitutional:  Negative for diaphoresis.  Eyes:  Negative for pain.  Respiratory:  Negative for shortness of breath.   Cardiovascular:  Negative for chest pain, palpitations and leg swelling.  Gastrointestinal:  Negative for abdominal pain.  Endocrine: Negative for polydipsia.  Skin:  Negative for rash.  Neurological:  Negative for dizziness, weakness and headaches.  Hematological:  Does not bruise/bleed easily.  All other systems reviewed and are negative.     Objective:   Physical Exam Vitals and nursing note reviewed.  Constitutional:      Appearance: Normal appearance. He is well-developed.  HENT:     Head: Normocephalic.     Nose: Nose normal.     Mouth/Throat:     Mouth: Mucous membranes are moist.     Pharynx: Oropharynx is clear.  Eyes:     Pupils: Pupils are equal, round, and reactive to light.  Neck:     Thyroid: No thyroid mass or thyromegaly.     Vascular: No carotid bruit or JVD.     Trachea: Phonation normal.  Cardiovascular:     Rate and Rhythm: Normal rate and regular rhythm.  Pulmonary:     Effort: Pulmonary effort is  normal. No respiratory distress.     Breath sounds: Normal breath sounds.  Abdominal:     General: Bowel sounds are normal.     Palpations: Abdomen is soft.     Tenderness: There is no abdominal tenderness.  Musculoskeletal:        General: Normal range of motion.     Cervical back: Normal range of motion and neck supple.  Lymphadenopathy:  Cervical: No cervical adenopathy.  Skin:    General: Skin is warm and dry.  Neurological:     Mental Status: He is alert and oriented to person, place, and time.     Comments: Constant movement during visit- not able to seat still at all.  Psychiatric:        Behavior: Behavior normal.        Thought Content: Thought content normal.        Judgment: Judgment normal.     BP (!) 107/39   Pulse (!) 59   Temp 97.6 F (36.4 C) (Oral)   Ht '5\' 7"'$  (1.702 m)   Wt 132 lb 3.2 oz (60 kg)   SpO2 97%   BMI 20.71 kg/m       Assessment & Plan:   LONNY EISEN comes in today with chief complaint of Medical Management of Chronic Issues   Diagnosis and orders addressed:  1. LBBB (left bundle branch block) 2. Aortic atherosclerosis ([place]) 3. Thoracic aorta atherosclerosis ([place]) Needs to follow up with catdiology  4. COPD (chronic obstructive pulmonary disease) with chronic bronchitis ([place]) - Fluticasone-Umeclidin-Vilant (TRELEGY ELLIPTA) 100-62.5-25 MCG/ACT AEPB; Inhale 1 puff into the lungs daily.  Dispense: 28 each; Refill: 11  5. Gastroesophageal reflux disease without esophagitis Avoid spicy foods Do not eat 2 hours prior to bedtime - pantoprazole (PROTONIX) 40 MG tablet; Take 1 tablet (40 mg total) by mouth daily.  Dispense: 30 tablet; Refill: 5  6. Esophageal dysphagia Chew food well before swallowing  7. Hyperlipidemia LDL goal <100 Low fat diet  8. Back pain, unspecified back location, unspecified back pain laterality, unspecified chronicity 9. Neck pain Continue tylenol as needed  10. Benign prostatic hyperplasia without  lower urinary tract symptoms - finasteride (PROSCAR) 5 MG tablet; Take 1 tablet (5 mg total) by mouth daily.  Dispense: 90 tablet; Refill: 1 - terazosin (HYTRIN) 5 MG capsule; TAKE (1) CAPSULE DAILY  Dispense: 90 capsule; Refill: 1  11. Primary insomnia Bedtime routine - LORazepam (ATIVAN) 0.5 MG tablet; Take nightly as needed for sleep  Dispense: 30 tablet; Refill: 5  12. Tremor, coarse  13. Vitamin D deficiency Continue daily vitamin d supplement  14. Vitamin B12 deficiency  15. Weakness of right leg Fall prevention  16. Neuropathy Check feet daily  17. Acalculous cholecystitis - ursodiol (ACTIGALL) 300 MG capsule; Take 1 capsule (300 mg total) by mouth 3 (three) times daily.  Dispense: 90 capsule; Refill: 11  18. Memory loss [place] daily - memantine (NAMENDA) 5 MG tablet; Take 1 tablet (5 mg total) by mouth in the morning, at noon, and at bedtime.  Dispense: 180 tablet; Refill: 1 - donepezil (ARICEPT) 10 MG tablet; Take 1 tablet (10 mg total) by mouth at bedtime.  Dispense: 90 tablet; Refill: 1  19. Primary hypertension Low sodium diet - losartan (COZAAR) 25 MG tablet; Take 0.5 tablets (12.5 mg total) by mouth daily.  Dispense: 45 tablet; Refill: 3   Labs pending Health Maintenance reviewed Diet and exercise encouraged  Follow up plan: 6 months   Mary-Margaret Hassell Done, FNP

## 2021-08-25 ENCOUNTER — Ambulatory Visit (INDEPENDENT_AMBULATORY_CARE_PROVIDER_SITE_OTHER): Payer: PPO | Admitting: Emergency Medicine

## 2021-08-25 DIAGNOSIS — E538 Deficiency of other specified B group vitamins: Secondary | ICD-10-CM

## 2021-08-26 ENCOUNTER — Other Ambulatory Visit: Payer: Self-pay | Admitting: Nurse Practitioner

## 2021-08-26 DIAGNOSIS — J301 Allergic rhinitis due to pollen: Secondary | ICD-10-CM

## 2021-09-24 ENCOUNTER — Ambulatory Visit (INDEPENDENT_AMBULATORY_CARE_PROVIDER_SITE_OTHER): Payer: PPO | Admitting: *Deleted

## 2021-09-24 DIAGNOSIS — E538 Deficiency of other specified B group vitamins: Secondary | ICD-10-CM

## 2021-09-24 NOTE — Progress Notes (Signed)
Patient in today for monthly B 12 injection. 1000 mcg given IM in right deltoid. Patient tolerated well.

## 2021-09-29 IMAGING — CT CT ANGIO CHEST-ABD-PELV FOR DISSECTION W/ AND WO/W CM
2 of 7 series · 11 of 46 positions shown, 12 images · IV contrast (Omnipaque or Isovue)
Comparison: Chest CT April 25, 2009

CLINICAL DATA: Chest and abdominal pain

EXAM:
CT ANGIOGRAPHY CHEST, ABDOMEN AND PELVIS
TECHNIQUE: Non-contrast CT of the chest was initially obtained.

[Series 7: axial arterial · axial · arterial · 0.83mm/px · z∈[+774,+1320]mm · 8 of 211 slices shown, 9 images]
[im 15/211  soft-tissue]
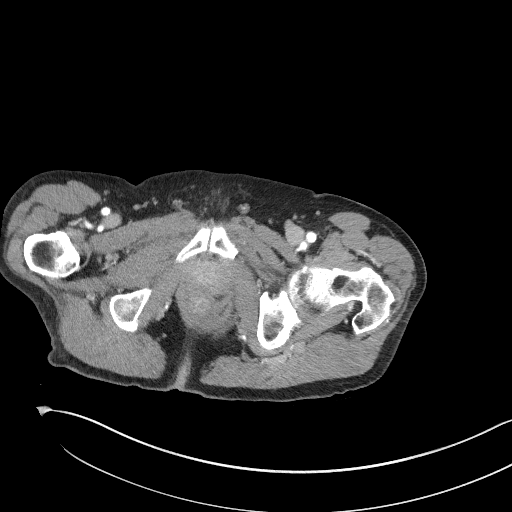
[im 15/211  bone]
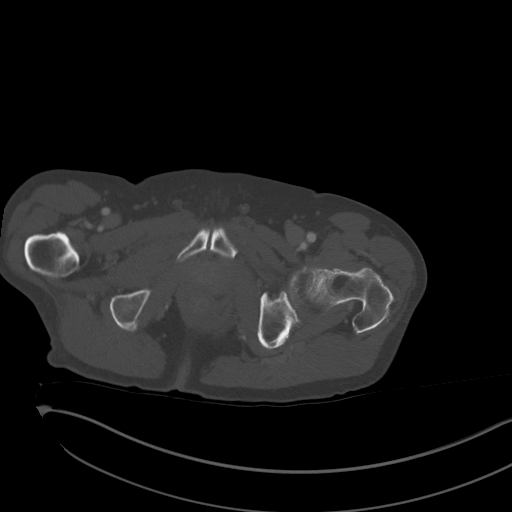
[im 43/211  soft-tissue]
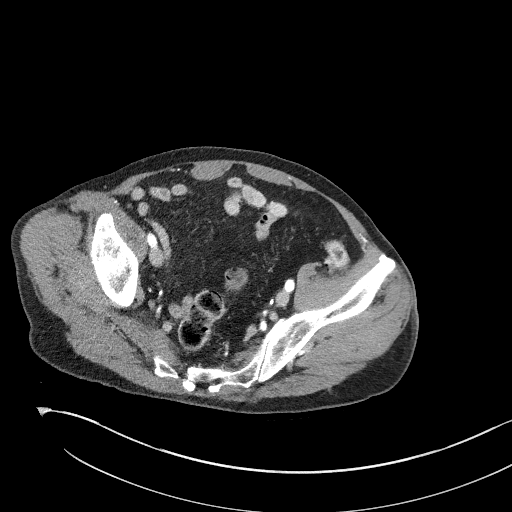
[im 71/211  soft-tissue]
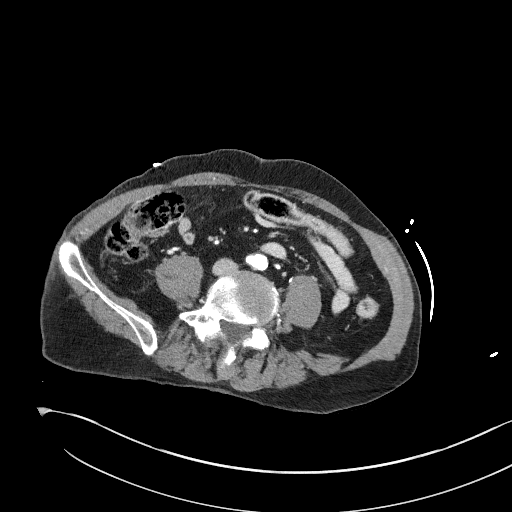
[im 99/211  soft-tissue]
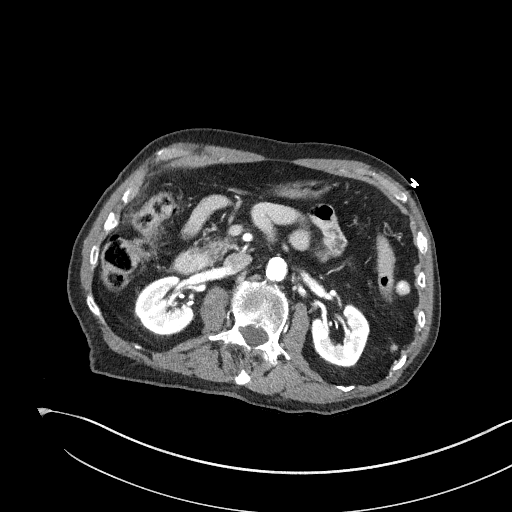
[im 113/211  soft-tissue]
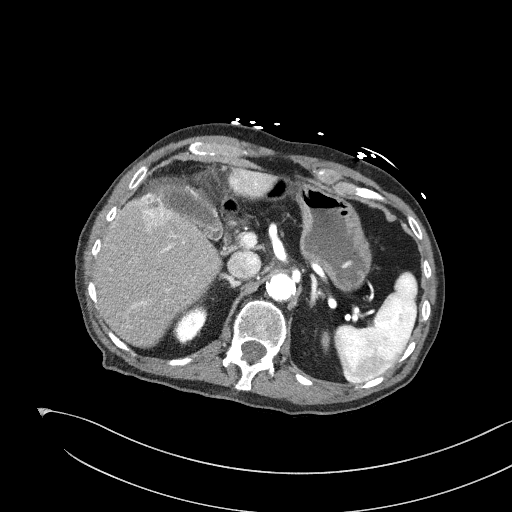
[im 141/211  soft-tissue]
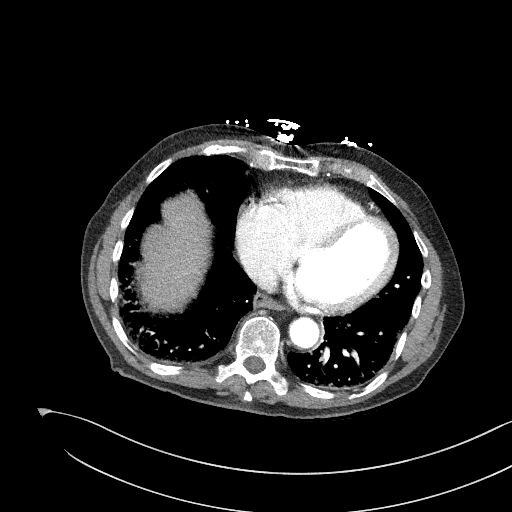
[im 169/211  soft-tissue]
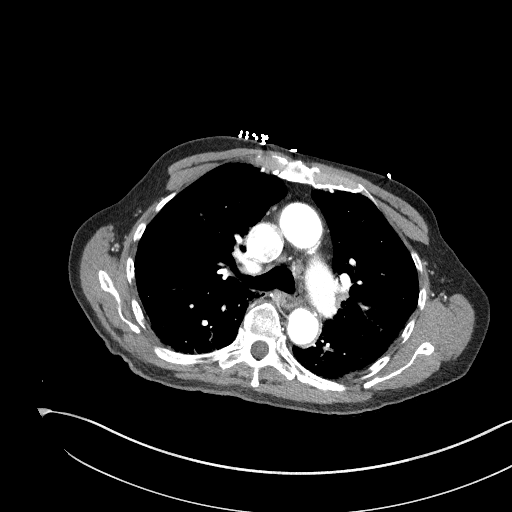
[im 197/211  soft-tissue]
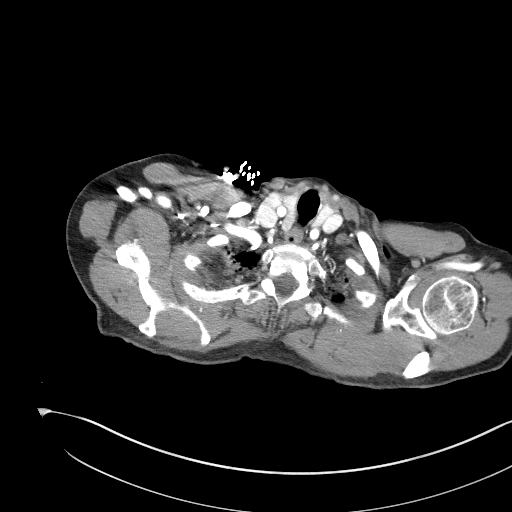

[Series 11: cor soft · coronal · 0.89mm/px · 3 of 128 slices shown]
[im 32/128  soft-tissue]
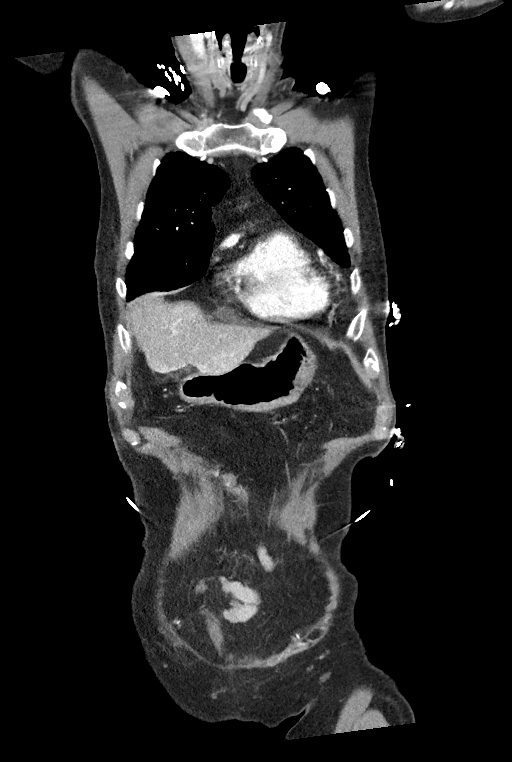
[im 64/128  soft-tissue]
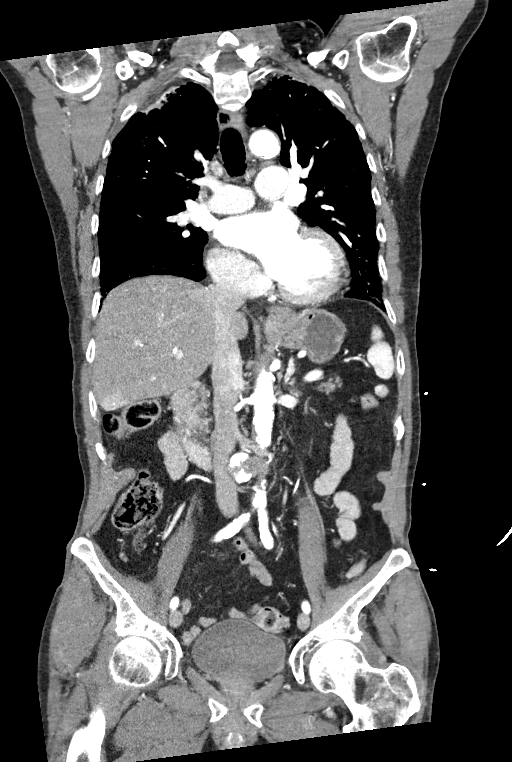
[im 96/128  soft-tissue]
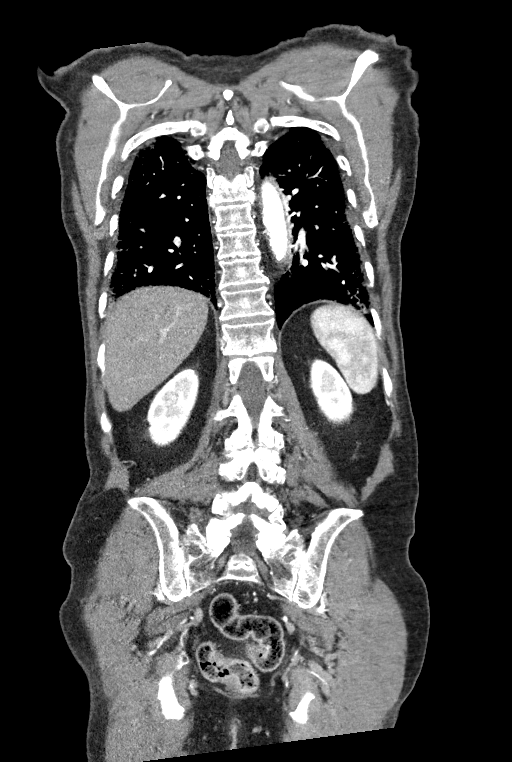

[11 of 46 positions shown; findings below may reference images not displayed]

Multidetector CT imaging through the chest, abdomen and pelvis was
performed using the standard protocol during bolus administration of
intravenous contrast. Multiplanar reconstructed images and MIPs were
obtained and reviewed to evaluate the vascular anatomy.

CONTRAST:  150mL OMNIPAQUE IOHEXOL 350 MG/ML SOLN
FINDINGS: CTA CHEST FINDINGS

Cardiovascular: There is no intramural hematoma on noncontrast
enhanced study. There is no evident mediastinal hematoma. There is
no thoracic aortic aneurysm or dissection. There are scattered foci
of calcification in visualized great vessels. There is aortic
atherosclerosis as well as scattered foci of coronary artery
calcification. There is no appreciable pericardial effusion or
pericardial thickening. No demonstrable pulmonary embolus.

Mediastinum/Nodes: Thyroid appears normal. There is no appreciable
thoracic adenopathy. There is a focal hiatal hernia.

Lungs/Pleura: There is scarring in each lung apex. More severe on
the right than on the left. A degree of superimposed infiltrate in
the apical segment of the right upper lobe cannot be excluded. There
are scattered areas of scarring and bullous disease in the upper
lobe regions. There is patchy bibasilar atelectatic change. No
appreciable pleural effusions. There is mild bronchiectatic change
in the right middle lobe.

Musculoskeletal: No blastic or lytic bone lesions. No appreciable
fracture or dislocation. No chest wall lesions.

Review of the MIP images confirms the above findings.

CTA ABDOMEN AND PELVIS FINDINGS

VASCULAR

Aorta: No evident abdominal aortic aneurysm or dissection. There is
atherosclerotic calcification throughout the course of the aorta
without hemodynamically significant obstruction.

Celiac: The celiac artery and its branches show no evident
dissection or aneurysm. There is moderate plaque at the origin of
the celiac artery. Other major branches of the celiac artery appear
widely patent.

SMA: Superior mesenteric artery and its branches are patent with
slight atherosclerotic plaque at the origin of the superior
mesenteric artery. Major superior mesenteric artery branches
elsewhere are patent. No aneurysm or dissection involving the
superior mesenteric artery or its branches.

Renals: There is a single renal artery on each side. There is
atherosclerotic calcification at the origin the right renal artery
causing approximately 70% diameter stenosis. There is 75-80%
diameter stenosis at the origin of the left renal artery. No
aneurysm or dissection involving the renal arteries and their
branches. No fibromuscular dysplasia evident.

IMA: Inferior mesenteric artery is patent although there is moderate
atherosclerotic plaque at the origin of this vessel. More distally,
branches of the inferior mesenteric artery are patent. No aneurysm
or dissection involving these branches.

Inflow: There is atherosclerotic plaque in both common iliac
arteries with several areas of 40-50% diameter stenosis in these
arteries. There are foci of atherosclerotic plaque in both internal
iliac and external iliac arteries with moderate least severe
narrowing at the origin of the right and left hypogastric arteries
but less than 50% diameter stenosis in the external iliac arteries.
There is mild atherosclerotic plaque in each common femoral artery
with less than 50% diameter stenosis. There is atherosclerotic
plaque in each proximal superficial femoral artery with areas of
50-60% diameter stenosis. No aneurysm or dissection involving these
vessels.

Veins: No obvious venous abnormality within the limitations of this
arterial phase study.

Review of the MIP images confirms the above findings.

NON-VASCULAR

Hepatobiliary: Fatty infiltration is noted in the liver. There is a
probable cyst near the dome of the liver on the right measuring 6
mm. Note that the wall of the gallbladder appears mildly thickened
and equivocally edematous. No appreciable biliary duct dilatation
evident.

Pancreas: There is no pancreatic mass or inflammatory focus.

Spleen: No splenic lesions are evident.

Adrenals/Urinary Tract: Adrenals bilaterally appear normal. There is
no appreciable renal mass or hydronephrosis on either side. No
appreciable renal or ureteral calculus evident on either side. Note
that contrast within the collecting systems and ureters may mask
smaller calculi. Urinary bladder wall thickness is within normal
limits.

Stomach/Bowel: There are sigmoid diverticula without evident
diverticulitis. There is no appreciable bowel wall or mesenteric
thickening. No evident bowel obstruction. The terminal ileum appears
normal. There is no appreciable free air or portal venous air.

Lymphatic: There is no evident adenopathy in the abdomen or pelvis.

Reproductive: Prostate and seminal vesicles are normal in size and
configuration.

Other: Appendix appears normal. No appreciable abscess or ascites in
the abdomen or pelvis.

Musculoskeletal: There is degenerative change in the thoracic spine.
There is 4 mm of anterolisthesis of L3 on L4, felt to be due to
underlying spondylosis. No evident blastic or lytic bone lesions. No
intramuscular or abdominal wall lesions are evident.

Review of the MIP images confirms the above findings.
IMPRESSION: CT angiogram chest:

1. No demonstrable thoracic aortic aneurysm or dissection. No
mediastinal hematoma or intramural lesion. There is aortic
atherosclerosis as well as foci of great vessel and coronary artery
calcification.
2. Apical scarring bilaterally. A degree of superimposed infiltrate
in the apical segment right upper lobe cannot be excluded. Scattered
areas of atelectatic change. Bullous disease in the apices noted.
Mild bronchiectasis right middle lobe.
3. Hiatal hernia present.
4. No demonstrable thoracic adenopathy.

CT angiogram abdomen; CT angiogram pelvis:

1. Atherosclerotic calcification throughout the aorta as well as at
multiple sites in mesenteric and pelvic arterial vessels.
Atherosclerotic plaque is most notable in the proximal renal
arteries as well as in the common and internal iliac arteries
bilaterally. No aneurysm or dissection evident in the aorta, major
mesenteric, and major pelvic arterial vessels. No evident
fibromuscular dysplasia.

2. Gallbladder wall appears mildly thickened with questionable
gallbladder wall edema. This appearance potentially could indicate a
degree of acalculus cholecystitis. Ultrasound evaluation of
gallbladder advised in this regard.

3.  Fatty infiltration in liver.

4. Sigmoid diverticula without diverticulitis. No bowel wall
thickening or bowel obstruction. No abscess in the abdomen or
pelvis. Appendix appears normal.

5. No hydronephrosis on either side. No renal or ureteral calculi
evident. Note that small calculi could be masked by intravenous
contrast. No urinary bladder wall thickening.

Aortic Atherosclerosis (XEP7Q-Y48.8).

## 2021-10-22 ENCOUNTER — Ambulatory Visit: Payer: PPO

## 2021-10-22 ENCOUNTER — Ambulatory Visit (INDEPENDENT_AMBULATORY_CARE_PROVIDER_SITE_OTHER): Payer: PPO | Admitting: *Deleted

## 2021-10-22 DIAGNOSIS — E538 Deficiency of other specified B group vitamins: Secondary | ICD-10-CM | POA: Diagnosis not present

## 2021-10-22 NOTE — Progress Notes (Signed)
Vitamin b12 injection given and patient tolerated well.  

## 2021-10-24 DIAGNOSIS — F0150 Vascular dementia without behavioral disturbance: Secondary | ICD-10-CM | POA: Diagnosis not present

## 2021-10-24 DIAGNOSIS — Z515 Encounter for palliative care: Secondary | ICD-10-CM | POA: Diagnosis not present

## 2021-10-24 DIAGNOSIS — J449 Chronic obstructive pulmonary disease, unspecified: Secondary | ICD-10-CM | POA: Diagnosis not present

## 2021-10-24 DIAGNOSIS — G309 Alzheimer's disease, unspecified: Secondary | ICD-10-CM | POA: Diagnosis not present

## 2021-10-28 ENCOUNTER — Encounter: Payer: Self-pay | Admitting: Nurse Practitioner

## 2021-10-28 ENCOUNTER — Ambulatory Visit (INDEPENDENT_AMBULATORY_CARE_PROVIDER_SITE_OTHER): Payer: PPO | Admitting: Nurse Practitioner

## 2021-10-28 VITALS — BP 150/67 | HR 70 | Temp 97.9°F | Resp 20 | Ht 67.0 in | Wt 130.0 lb

## 2021-10-28 DIAGNOSIS — M545 Low back pain, unspecified: Secondary | ICD-10-CM

## 2021-10-28 DIAGNOSIS — N400 Enlarged prostate without lower urinary tract symptoms: Secondary | ICD-10-CM | POA: Diagnosis not present

## 2021-10-28 LAB — URINALYSIS, COMPLETE
Bilirubin, UA: NEGATIVE
Glucose, UA: NEGATIVE
Leukocytes,UA: NEGATIVE
Nitrite, UA: NEGATIVE
Protein,UA: NEGATIVE
RBC, UA: NEGATIVE
Specific Gravity, UA: 1.025 (ref 1.005–1.030)
Urobilinogen, Ur: 0.2 mg/dL (ref 0.2–1.0)
pH, UA: 6 (ref 5.0–7.5)

## 2021-10-28 LAB — MICROSCOPIC EXAMINATION
RBC, Urine: NONE SEEN /hpf (ref 0–2)
Renal Epithel, UA: NONE SEEN /hpf

## 2021-10-28 NOTE — Progress Notes (Signed)
   Subjective:    Patient ID: Carlos Brooks, male    DOB: 12-10-1926, 88 y.o.   MRN: [id number]   Chief Complaint: urinary issues                       HPI Patient states that his family thought he had a kidney infection. He was goig requently with some hesitancy.    Review of Systems  Constitutional:  Negative for chills, diaphoresis and fever.  Eyes:  Negative for pain.  Respiratory:  Negative for shortness of breath.   Cardiovascular:  Negative for chest pain, palpitations and leg swelling.  Gastrointestinal:  Negative for abdominal pain.  Endocrine: Negative for polydipsia.  Genitourinary:  Negative for dysuria, flank pain, frequency, hematuria, penile pain, scrotal swelling and testicular pain.  Skin:  Negative for rash.  Neurological:  Negative for dizziness, weakness and headaches.  Hematological:  Does not bruise/bleed easily.  All other systems reviewed and are negative.      Objective:   Physical Exam Constitutional:      Appearance: Normal appearance.  Cardiovascular:     Rate and Rhythm: Normal rate and regular rhythm.     Heart sounds: Normal heart sounds.  Pulmonary:     Effort: Pulmonary effort is normal.     Breath sounds: Normal breath sounds.  Skin:    General: Skin is warm.  Neurological:     General: No focal deficit present.     Mental Status: He is alert and oriented to person, place, and time.  Psychiatric:        Mood and Affect: Mood normal.        Behavior: Behavior normal.    BP (!) 150/67   Pulse 70   Temp 97.9 F (36.6 C) (Temporal)   Resp 20   Ht '5\' 7"'$  (1.702 m)   Wt 130 lb (59 kg)   SpO2 91%   BMI 20.36 kg/m    Urine clear     Assessment & Plan:   Carlos Brooks in today with chief complaint of No chief complaint on file.   1. Acute bilateral low back pain without sciatica Tylenol OTC Moist heat  rest - Urinalysis, Complete - Urine Culture    The above assessment and management plan was discussed with the  patient. The patient verbalized understanding of and has agreed to the management plan. Patient is aware to call the clinic if symptoms persist or worsen. Patient is aware when to return to the clinic for a follow-up visit. Patient educated on when it is appropriate to go to the emergency department.   Mary-Margaret Hassell Done, FNP

## 2021-10-28 NOTE — Patient Instructions (Signed)
Acute Back Pain, Adult Acute back pain is sudden and usually short-lived. It is often caused by an injury to the muscles and tissues in the back. The injury may result from: A muscle, tendon, or ligament getting overstretched or torn. Ligaments are tissues that connect bones to each other. Lifting something improperly can cause a back strain. Wear and tear (degeneration) of the spinal disks. Spinal disks are circular tissue that provide cushioning between the bones of the spine (vertebrae). Twisting motions, such as while playing sports or doing yard work. A hit to the back. Arthritis. You may have a physical exam, lab tests, and imaging tests to find the cause of your pain. Acute back pain usually goes away with rest and home care. Follow these instructions at home: Managing pain, stiffness, and swelling Take over-the-counter and prescription medicines only as told by your health care provider. Treatment may include medicines for pain and inflammation that are taken by mouth or applied to the skin, or muscle relaxants. Your health care provider may recommend applying ice during the first 24-48 hours after your pain starts. To do this: Put ice in a plastic bag. Place a towel between your skin and the bag. Leave the ice on for 20 minutes, 2-3 times a day. Remove the ice if your skin turns bright red. This is very important. If you cannot feel pain, heat, or cold, you have a greater risk of damage to the area. If directed, apply heat to the affected area as often as told by your health care provider. Use the heat source that your health care provider recommends, such as a moist heat pack or a heating pad. Place a towel between your skin and the heat source. Leave the heat on for 20-30 minutes. Remove the heat if your skin turns bright red. This is especially important if you are unable to feel pain, heat, or cold. You have a greater risk of getting burned. Activity  Do not stay in bed. Staying in  bed for more than 1-2 days can delay your recovery. Sit up and stand up straight. Avoid leaning forward when you sit or hunching over when you stand. If you work at a desk, sit close to it so you do not need to lean over. Keep your chin tucked in. Keep your neck drawn back, and keep your elbows bent at a 90-degree angle (right angle). Sit high and close to the steering wheel when you drive. Add lower back (lumbar) support to your car seat, if needed. Take short walks on even surfaces as soon as you are able. Try to increase the length of time you walk each day. Do not sit, drive, or stand in one place for more than 30 minutes at a time. Sitting or standing for long periods of time can put stress on your back. Do not drive or use heavy machinery while taking prescription pain medicine. Use proper lifting techniques. When you bend and lift, use positions that put less stress on your back: Bend your knees. Keep the load close to your body. Avoid twisting. Exercise regularly as told by your health care provider. Exercising helps your back heal faster and helps prevent back injuries by keeping muscles strong and flexible. Work with a physical therapist to make a safe exercise program, as recommended by your health care provider. Do any exercises as told by your physical therapist. Lifestyle Maintain a healthy weight. Extra weight puts stress on your back and makes it difficult to have good   posture. Avoid activities or situations that make you feel anxious or stressed. Stress and anxiety increase muscle tension and can make back pain worse. Learn ways to manage anxiety and stress, such as through exercise. General instructions Sleep on a firm mattress in a comfortable position. Try lying on your side with your knees slightly bent. If you lie on your back, put a pillow under your knees. Keep your head and neck in a straight line with your spine (neutral position) when using electronic equipment like  smartphones or pads. To do this: Raise your smartphone or pad to look at it instead of bending your head or neck to look down. Put the smartphone or pad at the level of your face while looking at the screen. Follow your treatment plan as told by your health care provider. This may include: Cognitive or behavioral therapy. Acupuncture or massage therapy. Meditation or yoga. Contact a health care provider if: You have pain that is not relieved with rest or medicine. You have increasing pain going down into your legs or buttocks. Your pain does not improve after 2 weeks. You have pain at night. You lose weight without trying. You have a fever or chills. You develop nausea or vomiting. You develop abdominal pain. Get help right away if: You develop new bowel or bladder control problems. You have unusual weakness or numbness in your arms or legs. You feel faint. These symptoms may represent a serious problem that is an emergency. Do not wait to see if the symptoms will go away. Get medical help right away. Call your local emergency services (911 in the [place] Do not drive yourself to the hospital. Summary Acute back pain is sudden and usually short-lived. Use proper lifting techniques. When you bend and lift, use positions that put less stress on your back. Take over-the-counter and prescription medicines only as told by your health care provider, and apply heat or ice as told. This information is not intended to replace advice given to you by your health care provider. Make sure you discuss any questions you have with your health care provider. Document Revised: 05/17/2020 Document Reviewed: 05/17/2020 [employer] Patient Education  2023 [employer]  

## 2021-10-29 LAB — URINE CULTURE: Organism ID, Bacteria: NO GROWTH

## 2021-11-24 ENCOUNTER — Ambulatory Visit: Payer: PPO

## 2021-11-28 DIAGNOSIS — F0150 Vascular dementia without behavioral disturbance: Secondary | ICD-10-CM | POA: Diagnosis not present

## 2021-11-28 DIAGNOSIS — Z515 Encounter for palliative care: Secondary | ICD-10-CM | POA: Diagnosis not present

## 2021-11-28 DIAGNOSIS — G309 Alzheimer's disease, unspecified: Secondary | ICD-10-CM | POA: Diagnosis not present

## 2021-11-28 DIAGNOSIS — J449 Chronic obstructive pulmonary disease, unspecified: Secondary | ICD-10-CM | POA: Diagnosis not present

## 2021-12-03 ENCOUNTER — Ambulatory Visit: Payer: PPO | Admitting: Cardiology

## 2021-12-04 NOTE — Progress Notes (Signed)
Cardiology Office Note    Date:  12/05/2021   ID:  Carlos Brooks, DOB 04-10-1926, MRN [id number]  PCP:  Carlos Pretty, FNP  Cardiologist: Carlos Breeding, MD    Chief Complaint  Patient presents with   Follow-up    6 month visit    History of Present Illness:    Carlos Brooks is a 88 y.o. male with past medical history of carotid artery stenosis, HFmrEF (EF 40-45% by echo in 12/2020), LBBB, OSA, HLD, dementia, COPD and GERD who presents to the office today for 79-monthfollow-up.   He was examined by myself in 05/2021 and history was provided by family secondary to his dementia. He reported having occasional episodes of chest pain which would improve with nitroglycerin but denied any dyspnea on exertion or pitting edema. Conservative measures had previously been pursued in the setting of his dementia and Imdur was titrated from 30 mg daily to 60 mg daily to see if this would help with his symptoms. He was continued on ASA 81 mg daily and had previously elected to stop Repatha given his age (intolerant to statins).  In talking with the patient, his wife and his daughter today, his biggest issue has been left shoulder pain which is reproducible with palpation and worse with movement. He has reported to his wife having chest pain at times but he is unable to elaborate on frequency of symptoms given his dementia. They do give him nitroglycerin and symptoms improve. He experiences intermittent dyspnea and they report he was previously followed by Dr. HLuan Pullingprior to his retirement but never established with a pulmonologist after this. He does not like to use his inhaler but previously used a nebulizer with improvement in symptoms but does not have this currently. He denies any specific orthopnea, PND or pitting edema.   Past Medical History:  Diagnosis Date   BPH (benign prostatic hyperplasia)    Cancer (HCC)    skin   Cataract    CHF (congestive heart failure) (HCC)    a.  EF 40-45% by echo in 12/2020   COPD (chronic obstructive pulmonary disease) (HCC)    Generalized headaches    GERD (gastroesophageal reflux disease)    Hiatal hernia    Hyperlipidemia     Past Surgical History:  Procedure Laterality Date   EYE SURGERY     HERNIA REPAIR     x 3, inguinal    skin cancer removal     head    Current Medications: Outpatient Medications Prior to Visit  Medication Sig Dispense Refill   aspirin 81 MG EC tablet Take 1 tablet (81 mg total) by mouth daily. 30 tablet 12   donepezil (ARICEPT) 10 MG tablet Take 1 tablet (10 mg total) by mouth at bedtime. 90 tablet 1   finasteride (PROSCAR) 5 MG tablet Take 1 tablet (5 mg total) by mouth daily. 90 tablet 1   fluticasone (FLONASE) 50 MCG/ACT nasal spray Place 2 sprays into both nostrils daily. 16 g 6   Fluticasone-Umeclidin-Vilant (TRELEGY ELLIPTA) 100-62.5-25 MCG/ACT AEPB Inhale 1 puff into the lungs daily. 28 each 11   guaiFENesin (MUCINEX) 600 MG 12 hr tablet Take 2 tablets (1,200 mg total) by mouth 2 (two) times daily as needed.     isosorbide mononitrate (IMDUR) 60 MG 24 hr tablet Take 1 tablet (60 mg total) by mouth daily. 90 tablet 3   levocetirizine (XYZAL) 5 MG tablet TAKE 1/2 TABLET ONCE DAILY AS NEEDED 30 tablet 11  LORazepam (ATIVAN) 0.5 MG tablet Take nightly as needed for sleep 30 tablet 5   losartan (COZAAR) 25 MG tablet Take 0.5 tablets (12.5 mg total) by mouth daily. 45 tablet 3   memantine (NAMENDA) 5 MG tablet Take 1 tablet (5 mg total) by mouth in the morning, at noon, and at bedtime. 180 tablet 1   terazosin (HYTRIN) 5 MG capsule TAKE (1) CAPSULE DAILY 90 capsule 1   ursodiol (ACTIGALL) 300 MG capsule Take 1 capsule (300 mg total) by mouth 3 (three) times daily. 90 capsule 11   nitroGLYCERIN (NITROSTAT) 0.4 MG SL tablet Place 1 tablet (0.4 mg total) under the tongue every 5 (five) minutes as needed for chest pain. 25 tablet 2   OVER THE COUNTER MEDICATION Vit b 12 1000 IM Q month (Patient not  taking: Reported on 12/05/2021)     pantoprazole (PROTONIX) 40 MG tablet Take 1 tablet (40 mg total) by mouth daily. (Patient not taking: Reported on 12/05/2021) 30 tablet 5   PROAIR HFA 108 (90 Base) MCG/ACT inhaler 2 PUFFS EVERY 6 HOURS AS NEEDED FOR WHEEZING OR SHORTNESS OF BREATH (Patient not taking: Reported on 12/05/2021) 8.5 g 11   Facility-Administered Medications Prior to Visit  Medication Dose Route Frequency Provider Last Rate Last Admin   cyanocobalamin ((VITAMIN B-12)) injection 1,000 mcg  1,000 mcg Intramuscular Q30 days Carlos Pretty, FNP   1,000 mcg at 10/22/21 1026     Allergies:   Clarithromycin, Crestor [rosuvastatin calcium], Lipitor [atorvastatin calcium], Niaspan [niacin er], Pneumovax [pneumococcal polysaccharide vaccine], Pravachol, Ranitidine hcl, and Simvastatin   Social History   Socioeconomic History   Marital status: Married    Spouse name: Not on file   Number of children: 5   Years of education: Not on file   Highest education level: 8th grade  Occupational History   Occupation: Retired  Tobacco Use   Smoking status: Former    Types: Cigarettes    Quit date: 04/13/1993    Years since quitting: 28.6   Smokeless tobacco: Never  Vaping Use   Vaping Use: Never used  Substance and Sexual Activity   Alcohol use: No   Drug use: No   Sexual activity: Not Currently  Other Topics Concern   Not on file  Social History Narrative   Not on file   Social Determinants of Health   Financial Resource Strain: Low Risk  (10/13/2017)   Overall Financial Resource Strain (CARDIA)    Difficulty of Paying Living Expenses: Not hard at all  Food Insecurity: No Food Insecurity (10/13/2017)   Hunger Vital Sign    Worried About Running Out of Food in the Last Year: Never true    Ran Out of Food in the Last Year: Never true  Transportation Needs: No Transportation Needs (10/13/2017)   PRAPARE - Hydrologist (Medical): No    Lack of  Transportation (Non-Medical): No  Physical Activity: Inactive (10/13/2017)   Exercise Vital Sign    Days of Exercise per Week: 0 days    Minutes of Exercise per Session: 0 min  Stress: No Stress Concern Present (10/13/2017)   [place]    Feeling of Stress : Not at all  Social Connections: [place] (10/13/2017)   Social Connection and Isolation Panel [NHANES]    Frequency of Communication with Friends and Family: More than three times a week    Frequency of Social Gatherings with Friends and Family: More  than three times a week    Attends Religious Services: More than 4 times per year    Active Member of Clubs or Organizations: Yes    Attends Music therapist: More than 4 times per year    Marital Status: Married     Family History:  The patient's family history includes Aneurysm in his brother; Asthma in his son; Cancer in his brother, father, and mother; Heart attack (age of onset: 20) in his son; Heart attack (age of onset: 63) in his brother; Heart attack (age of onset: 41) in his brother; Heart disease in his son; Hypertension in his son; Obesity in his son.   Review of Systems:    Please see the history of present illness.     All other systems reviewed and are otherwise negative except as noted above.   Physical Exam:    VS:  BP (!) 142/60   Pulse 63   Ht '5\' 6"'$  (1.676 m)   Wt 125 lb 12.8 oz (57.1 kg)   SpO2 96%   BMI 20.30 kg/m    General: Pleasant, elderly male appearing in no acute distress. Head: Normocephalic, atraumatic. Neck: No carotid bruits. JVD not elevated.  Lungs: Respirations regular and unlabored, without wheezes or rales.  Heart: Regular rate and rhythm. No S3 or S4.  No murmur, no rubs, or gallops appreciated. Abdomen: Appears non-distended. No obvious abdominal masses. Msk:  Strength and tone appear normal for age. No obvious joint deformities or  effusions. Extremities: No clubbing or cyanosis. No pitting edema.  Distal pedal pulses are 2+ bilaterally. Neuro: Alert and oriented X 3. Moves all extremities spontaneously. No focal deficits noted. Psych:  Responds to questions appropriately with a normal affect. Skin: No rashes or lesions noted  Wt Readings from Last 3 Encounters:  12/05/21 125 lb 12.8 oz (57.1 kg)  10/28/21 130 lb (59 kg)  08/12/21 132 lb 3.2 oz (60 kg)     Studies/Labs Reviewed:   EKG:  EKG is ordered today.  The ekg ordered today demonstrates sinus bradycardia, HR 58 with LAD and known LBBB.   Recent Labs: 02/11/2021: ALT 11; BUN 21; Creatinine, Ser 1.22; Hemoglobin 12.8; Platelets 185; Potassium 4.3; Sodium 143   Lipid Panel    Component Value Date/Time   CHOL 209 (H) 02/11/2021 1233   CHOL 197 08/04/2012 1151   TRIG 157 (H) 02/11/2021 1233   TRIG 191 (H) 10/21/2015 1048   TRIG 162 (H) 08/04/2012 1151   HDL 47 02/11/2021 1233   HDL 46 10/21/2015 1048   HDL 45 08/04/2012 1151   CHOLHDL 4.4 02/11/2021 1233   LDLCALC 134 (H) 02/11/2021 1233   LDLCALC 154 (H) 12/05/2013 0920   LDLCALC 120 (H) 08/04/2012 1151    Additional studies/ records that were reviewed today include:   Echocardiogram: 12/09/2020 IMPRESSIONS     1. Left ventricular ejection fraction, by estimation, is 40 to 45%. The  left ventricle has mildly decreased function. The left ventricle  demonstrates global hypokinesis. Left ventricular diastolic parameters are  consistent with Grade I diastolic  dysfunction (impaired relaxation).   2. Right ventricular systolic function is normal. The right ventricular  size is normal. Tricuspid regurgitation signal is inadequate for assessing  PA pressure.   3. The mitral valve is grossly normal. Mild mitral valve regurgitation.   4. The aortic valve is tricuspid. There is mild calcification of the  aortic valve. Aortic valve regurgitation is mild.   5. The inferior vena cava  is normal in size  with greater than 50%  respiratory variability, suggesting right atrial pressure of 3 mmHg.   Comparison(s): No prior Echocardiogram.   Assessment:    1. HFrEF (heart failure with reduced ejection fraction) (HCC)   2. Chest pain of uncertain etiology   3. Bilateral carotid artery stenosis   4. Mixed hyperlipidemia   5. Chronic obstructive pulmonary disease, unspecified COPD type ([place])      Plan:   In order of problems listed above:  1. HFmrEF/Chest Pain with Mixed Features - His EF was at 40-45% in 12/2020 and in discussion with the patient and his family, medical management has been pursued as compared to an aggressive approach given his age and dementia.  - He does experience intermittent chest pain at times but is unable to elaborate on the details surrounding this. I do think his shoulder pain is due to MSK/rotator cuff issues as it is reproducible and worse with movement.  - Will continue current medical therapy for now with ASA '81mg'$  daily, Imdur '60mg'$  daily and Losartan 12.'5mg'$  daily. Will update his Rx for SL NTG. He has not been on a beta-blocker due to baseline bradycardia and has not been on an SGLT2 inhibitor due to his body habitus and decreased PO intake.   2. Carotid Artery Stenosis - Dopplers in 2020 showed less than 50% stenosis bilaterally and repeat imaging has not been pursued as he does not wish to undergo additional procedures. Remains on ASA. No longer on statin therapy.   3. HLD - He has been intolerant to statins and was previously on Repatha but wished to stop this given his age which seems very reasonable.   4. COPD - He does experience intermittent dyspnea and previously had improvement with using a nebulizer but has been without this. Will send in an updated Rx for his nebulizer and Albuterol. Can consider a Pulmonology referral but it is unclear if he will be open to using the nebulizer or different inhalers as he currently does not use his current inhaler  routinely.    Medication Adjustments/Labs and Tests Ordered: Current medicines are reviewed at length with the patient today.  Concerns regarding medicines are outlined above.  Medication changes, Labs and Tests ordered today are listed in the Patient Instructions below. Patient Instructions  Medication Instructions:  Your physician recommends that you continue on your current medications as directed. Please refer to the Current Medication list given to you today.  *If you need a refill on your cardiac medications before your next appointment, please call your pharmacy*   Lab Work: NONE   If you have labs (blood work) drawn today and your tests are completely normal, you will receive your results only by: [place] (if you have MyChart) OR A paper copy in the mail If you have any lab test that is abnormal or we need to change your treatment, we will call you to review the results.   Testing/Procedures: NONE    Follow-Up: At [facility], you and your health needs are our priority.  As part of our continuing mission to provide you with exceptional heart care, we have created designated Provider Care Teams.  These Care Teams include your primary Cardiologist (physician) and Advanced Practice Providers (APPs -  Physician Assistants and Nurse Practitioners) who all work together to provide you with the care you need, when you need it.  We recommend signing up for the patient portal called "MyChart".  Sign up information is  provided on this After Visit Summary.  MyChart is used to connect with patients for Virtual Visits (Telemedicine).  Patients are able to view lab/test results, encounter notes, upcoming appointments, etc.  Non-urgent messages can be sent to your provider as well.   To learn more about what you can do with MyChart, go to [email or web address].    Your next appointment:   6 month(s)  The format for your next appointment:   In Person  Provider:    Minus Breeding, MD    Other Instructions Thank you for choosing [facility]!    Important Information About Sugar         Signed, Erma Heritage, PA-C  12/05/2021 9:08 PM    [facility]. [address] [place], [place] [address] Phone: [phone] Fax: [phone]

## 2021-12-05 ENCOUNTER — Ambulatory Visit: Payer: PPO | Attending: Cardiology | Admitting: Student

## 2021-12-05 ENCOUNTER — Encounter: Payer: Self-pay | Admitting: Student

## 2021-12-05 VITALS — BP 142/60 | HR 63 | Ht 66.0 in | Wt 125.8 lb

## 2021-12-05 DIAGNOSIS — R079 Chest pain, unspecified: Secondary | ICD-10-CM | POA: Diagnosis not present

## 2021-12-05 DIAGNOSIS — E782 Mixed hyperlipidemia: Secondary | ICD-10-CM | POA: Diagnosis not present

## 2021-12-05 DIAGNOSIS — I6523 Occlusion and stenosis of bilateral carotid arteries: Secondary | ICD-10-CM

## 2021-12-05 DIAGNOSIS — J449 Chronic obstructive pulmonary disease, unspecified: Secondary | ICD-10-CM

## 2021-12-05 DIAGNOSIS — I5020 Unspecified systolic (congestive) heart failure: Secondary | ICD-10-CM | POA: Diagnosis not present

## 2021-12-05 MED ORDER — ALBUTEROL SULFATE 0.63 MG/3ML IN NEBU: 1.0000 | INHALATION_SOLUTION | Freq: Four times a day (QID) | RESPIRATORY_TRACT | 3 refills | Status: DC | PRN

## 2021-12-05 MED ORDER — NEBULIZER/TUBING/MOUTHPIECE KIT: 1.0000 [IU] | PACK | Freq: Four times a day (QID) | 0 refills | Status: DC

## 2021-12-05 MED ORDER — NITROGLYCERIN 0.4 MG SL SUBL: 0.4000 mg | SUBLINGUAL_TABLET | SUBLINGUAL | 2 refills | Status: DC | PRN

## 2021-12-05 NOTE — Patient Instructions (Signed)
Medication Instructions:  Your physician recommends that you continue on your current medications as directed. Please refer to the Current Medication list given to you today.  *If you need a refill on your cardiac medications before your next appointment, please call your pharmacy*   Lab Work: NONE   If you have labs (blood work) drawn today and your tests are completely normal, you will receive your results only by: [place] (if you have MyChart) OR A paper copy in the mail If you have any lab test that is abnormal or we need to change your treatment, we will call you to review the results.   Testing/Procedures: NONE    Follow-Up: At [facility], you and your health needs are our priority.  As part of our continuing mission to provide you with exceptional heart care, we have created designated Provider Care Teams.  These Care Teams include your primary Cardiologist (physician) and Advanced Practice Providers (APPs -  Physician Assistants and Nurse Practitioners) who all work together to provide you with the care you need, when you need it.  We recommend signing up for the patient portal called "MyChart".  Sign up information is provided on this After Visit Summary.  MyChart is used to connect with patients for Virtual Visits (Telemedicine).  Patients are able to view lab/test results, encounter notes, upcoming appointments, etc.  Non-urgent messages can be sent to your provider as well.   To learn more about what you can do with MyChart, go to [email or web address].    Your next appointment:   6 month(s)  The format for your next appointment:   In Person  Provider:   Minus Breeding, MD    Other Instructions Thank you for choosing [facility]!    Important Information About Sugar

## 2021-12-26 ENCOUNTER — Other Ambulatory Visit: Payer: Self-pay | Admitting: Nurse Practitioner

## 2021-12-26 DIAGNOSIS — R413 Other amnesia: Secondary | ICD-10-CM

## 2022-01-09 DIAGNOSIS — I5020 Unspecified systolic (congestive) heart failure: Secondary | ICD-10-CM | POA: Diagnosis not present

## 2022-01-09 DIAGNOSIS — Z515 Encounter for palliative care: Secondary | ICD-10-CM | POA: Diagnosis not present

## 2022-01-09 DIAGNOSIS — F0150 Vascular dementia without behavioral disturbance: Secondary | ICD-10-CM | POA: Diagnosis not present

## 2022-01-09 DIAGNOSIS — G309 Alzheimer's disease, unspecified: Secondary | ICD-10-CM | POA: Diagnosis not present

## 2022-01-09 DIAGNOSIS — R079 Chest pain, unspecified: Secondary | ICD-10-CM | POA: Diagnosis not present

## 2022-01-09 DIAGNOSIS — J449 Chronic obstructive pulmonary disease, unspecified: Secondary | ICD-10-CM | POA: Diagnosis not present

## 2022-01-09 DIAGNOSIS — R634 Abnormal weight loss: Secondary | ICD-10-CM | POA: Diagnosis not present

## 2022-01-25 ENCOUNTER — Encounter (INDEPENDENT_AMBULATORY_CARE_PROVIDER_SITE_OTHER): Payer: Self-pay | Admitting: Gastroenterology

## 2022-02-12 ENCOUNTER — Other Ambulatory Visit: Payer: Self-pay | Admitting: Nurse Practitioner

## 2022-02-12 ENCOUNTER — Ambulatory Visit (INDEPENDENT_AMBULATORY_CARE_PROVIDER_SITE_OTHER): Payer: PPO | Admitting: Nurse Practitioner

## 2022-02-12 ENCOUNTER — Encounter: Payer: Self-pay | Admitting: Nurse Practitioner

## 2022-02-12 VITALS — BP 144/68 | HR 65 | Temp 96.7°F | Resp 20 | Ht 66.0 in | Wt 118.0 lb

## 2022-02-12 DIAGNOSIS — I447 Left bundle-branch block, unspecified: Secondary | ICD-10-CM | POA: Diagnosis not present

## 2022-02-12 DIAGNOSIS — F5101 Primary insomnia: Secondary | ICD-10-CM | POA: Diagnosis not present

## 2022-02-12 DIAGNOSIS — K219 Gastro-esophageal reflux disease without esophagitis: Secondary | ICD-10-CM | POA: Diagnosis not present

## 2022-02-12 DIAGNOSIS — E785 Hyperlipidemia, unspecified: Secondary | ICD-10-CM | POA: Diagnosis not present

## 2022-02-12 DIAGNOSIS — E538 Deficiency of other specified B group vitamins: Secondary | ICD-10-CM | POA: Diagnosis not present

## 2022-02-12 DIAGNOSIS — R1319 Other dysphagia: Secondary | ICD-10-CM

## 2022-02-12 DIAGNOSIS — I10 Essential (primary) hypertension: Secondary | ICD-10-CM

## 2022-02-12 DIAGNOSIS — G629 Polyneuropathy, unspecified: Secondary | ICD-10-CM

## 2022-02-12 DIAGNOSIS — J4489 Other specified chronic obstructive pulmonary disease: Secondary | ICD-10-CM

## 2022-02-12 DIAGNOSIS — I700 Atherosclerosis of aorta: Secondary | ICD-10-CM | POA: Diagnosis not present

## 2022-02-12 DIAGNOSIS — R413 Other amnesia: Secondary | ICD-10-CM

## 2022-02-12 DIAGNOSIS — Z23 Encounter for immunization: Secondary | ICD-10-CM

## 2022-02-12 DIAGNOSIS — E559 Vitamin D deficiency, unspecified: Secondary | ICD-10-CM

## 2022-02-12 DIAGNOSIS — N400 Enlarged prostate without lower urinary tract symptoms: Secondary | ICD-10-CM

## 2022-02-12 DIAGNOSIS — K819 Cholecystitis, unspecified: Secondary | ICD-10-CM

## 2022-02-12 MED ORDER — DONEPEZIL HCL 10 MG PO TABS: 10.0000 mg | ORAL_TABLET | Freq: Every day | ORAL | 1 refills | Status: AC

## 2022-02-12 MED ORDER — PANTOPRAZOLE SODIUM 40 MG PO TBEC: 40.0000 mg | DELAYED_RELEASE_TABLET | Freq: Every day | ORAL | 5 refills | Status: DC

## 2022-02-12 MED ORDER — FINASTERIDE 5 MG PO TABS: 5.0000 mg | ORAL_TABLET | Freq: Every day | ORAL | 1 refills | Status: DC

## 2022-02-12 MED ORDER — TERAZOSIN HCL 5 MG PO CAPS: ORAL_CAPSULE | ORAL | 1 refills | Status: DC

## 2022-02-12 MED ORDER — FLUTICASONE-UMECLIDIN-VILANT 100-62.5-25 MCG/ACT IN AEPB: 1.0000 | INHALATION_SPRAY | Freq: Every day | RESPIRATORY_TRACT | 11 refills | Status: DC

## 2022-02-12 MED ORDER — LOSARTAN POTASSIUM 25 MG PO TABS: 12.5000 mg | ORAL_TABLET | Freq: Every day | ORAL | 3 refills | Status: DC

## 2022-02-12 MED ORDER — LORAZEPAM 0.5 MG PO TABS: ORAL_TABLET | ORAL | 5 refills | Status: DC

## 2022-02-12 MED ORDER — URSODIOL 300 MG PO CAPS: 300.0000 mg | ORAL_CAPSULE | Freq: Three times a day (TID) | ORAL | 11 refills | Status: DC

## 2022-02-12 MED ORDER — MEMANTINE HCL 5 MG PO TABS: 5.0000 mg | ORAL_TABLET | Freq: Three times a day (TID) | ORAL | 0 refills | Status: DC

## 2022-02-12 NOTE — Progress Notes (Signed)
Subjective:    Patient ID: Carlos Brooks, male    DOB: 1926-10-24, 88 y.o.   MRN: [id number]   Chief Complaint: Medical Management of Chronic Issues    HPI:  Carlos Brooks is a 88 y.o. who identifies as a male who was assigned male at birth.   Social history: Lives with: wife- daughter checks on them daily Work history: retired   Scientist, forensic in today for follow up of the following chronic medical issues:  1. Hyperlipidemia LDL goal <100 Eats whatever his family brings him  to eat. Lab Results  Component Value Date   CHOL 209 (H) 02/11/2021   HDL 47 02/11/2021   LDLCALC 134 (H) 02/11/2021   TRIG 157 (H) 02/11/2021   CHOLHDL 4.4 02/11/2021     2. Aortic atherosclerosis ([place]) 3. LBBB (left bundle branch block) 4. Thoracic aorta atherosclerosis [facility]) Last saw cardiology in September. Review of office note showed no change in plan of care.  5. COPD (chronic obstructive pulmonary disease) with chronic bronchitis ([place]) He is giving hisself breathing treatments as needed.   6. Esophageal dysphagia No swallowing issues  7. Gastroesophageal reflux disease without esophagitis Eats what ever family brings him  36. Neuropathy Has numbness in feet. Make shim a fall risk  9. Benign prostatic hyperplasia without lower urinary tract symptoms No voiding issues  10. Memory loss Has been getting worse.  11. Vitamin B12 deficiency Gets b12 injections monthly  12. Vitamin D deficiency Is on daily viatmin d supplement  13. Primary insomnia Sleeps well most nights.   New complaints: Hospice is coming out to evaluate him tomorrow.  Allergies  Allergen Reactions   Clarithromycin    Crestor [Rosuvastatin Calcium]    Lipitor [Atorvastatin Calcium]    Niaspan [Niacin Er]    Pneumovax [Pneumococcal Polysaccharide Vaccine]     Arm swelling and rash   Pravachol    Ranitidine Hcl    Simvastatin    Outpatient Encounter Medications as of 02/12/2022  Medication Sig    albuterol (ACCUNEB) 0.63 MG/3ML nebulizer solution Take 3 mLs (0.63 mg total) by nebulization every 6 (six) hours as needed for wheezing.   aspirin 81 MG EC tablet Take 1 tablet (81 mg total) by mouth daily.   donepezil (ARICEPT) 10 MG tablet Take 1 tablet (10 mg total) by mouth at bedtime.   finasteride (PROSCAR) 5 MG tablet Take 1 tablet (5 mg total) by mouth daily.   fluticasone (FLONASE) 50 MCG/ACT nasal spray Place 2 sprays into both nostrils daily.   Fluticasone-Umeclidin-Vilant (TRELEGY ELLIPTA) 100-62.5-25 MCG/ACT AEPB Inhale 1 puff into the lungs daily.   guaiFENesin (MUCINEX) 600 MG 12 hr tablet Take 2 tablets (1,200 mg total) by mouth 2 (two) times daily as needed.   levocetirizine (XYZAL) 5 MG tablet TAKE 1/2 TABLET ONCE DAILY AS NEEDED   LORazepam (ATIVAN) 0.5 MG tablet Take nightly as needed for sleep   losartan (COZAAR) 25 MG tablet Take 0.5 tablets (12.5 mg total) by mouth daily.   memantine (NAMENDA) 5 MG tablet TAKE ONE TABLET THREE TIMES DAILY   nitroGLYCERIN (NITROSTAT) 0.4 MG SL tablet Place 1 tablet (0.4 mg total) under the tongue every 5 (five) minutes as needed for chest pain.   OVER THE COUNTER MEDICATION Vit b 12 1000 IM Q month   pantoprazole (PROTONIX) 40 MG tablet Take 1 tablet (40 mg total) by mouth daily.   Respiratory Therapy Supplies (NEBULIZER/TUBING/MOUTHPIECE) KIT 1 Units by Does not apply route every 6 (six)  hours.   terazosin (HYTRIN) 5 MG capsule TAKE (1) CAPSULE DAILY   ursodiol (ACTIGALL) 300 MG capsule Take 1 capsule (300 mg total) by mouth 3 (three) times daily.   isosorbide mononitrate (IMDUR) 60 MG 24 hr tablet Take 1 tablet (60 mg total) by mouth daily.   Facility-Administered Encounter Medications as of 02/12/2022  Medication   cyanocobalamin ((VITAMIN B-12)) injection 1,000 mcg    Past Surgical History:  Procedure Laterality Date   EYE SURGERY     HERNIA REPAIR     x 3, inguinal    skin cancer removal     head    Family History  Problem  Relation Age of Onset   Cancer Mother        ovairian   Cancer Father        lung   Heart attack Brother 17       Not clear details   Heart attack Son 83       Died with an MI   Heart disease Son    Cancer Brother        liver   Heart attack Brother 38   Aneurysm Brother    Hypertension Son    Obesity Son    Asthma Son       Controlled substance contract: n/a     Review of Systems  Constitutional:  Negative for diaphoresis.  Eyes:  Negative for pain.  Respiratory:  Negative for shortness of breath.   Cardiovascular:  Negative for chest pain, palpitations and leg swelling.  Gastrointestinal:  Negative for abdominal pain.  Endocrine: Negative for polydipsia.  Skin:  Negative for rash.  Neurological:  Negative for dizziness, weakness and headaches.  Hematological:  Does not bruise/bleed easily.  All other systems reviewed and are negative.      Objective:   Physical Exam Vitals and nursing note reviewed.  Constitutional:      Appearance: Normal appearance. He is well-developed.  HENT:     Head: Normocephalic.     Nose: Nose normal.     Mouth/Throat:     Mouth: Mucous membranes are moist.     Pharynx: Oropharynx is clear.  Eyes:     Pupils: Pupils are equal, round, and reactive to light.  Neck:     Thyroid: No thyroid mass or thyromegaly.     Vascular: No carotid bruit or JVD.     Trachea: Phonation normal.  Cardiovascular:     Rate and Rhythm: Normal rate and regular rhythm.  Pulmonary:     Effort: Pulmonary effort is normal. No respiratory distress.     Breath sounds: Normal breath sounds.  Abdominal:     General: Bowel sounds are normal.     Palpations: Abdomen is soft.     Tenderness: There is no abdominal tenderness.  Musculoskeletal:        General: Normal range of motion.     Cervical back: Normal range of motion and neck supple.     Comments: Very unsteady gait.  Lymphadenopathy:     Cervical: No cervical adenopathy.  Skin:    General: Skin  is warm and dry.  Neurological:     Mental Status: He is alert and oriented to person, place, and time.  Psychiatric:        Behavior: Behavior normal.        Thought Content: Thought content normal.        Judgment: Judgment normal.     BP (!) 144/68  Pulse 65   Temp (!) 96.7 F (35.9 C) (Temporal)   Resp 20   Ht _0  (1.676 m)   Wt 118 lb (53.5 kg)   SpO2 97%   BMI 19.05 kg/m        Assessment & Plan:   DEMETREUS LOTHAMER comes in today with chief complaint of Medical Management of Chronic Issues   Diagnosis and orders addressed:  1. Hyperlipidemia LDL goal <100 Low fat diet  2. Aortic atherosclerosis ([place]) 3. LBBB (left bundle branch block) 4. Thoracic aorta atherosclerosis ([place])  . COPD (chronic obstructive pulmonary disease) with chronic bronchitis ([place]) - Fluticasone-Umeclidin-Vilant (TRELEGY ELLIPTA) 100-62.5-25 MCG/ACT AEPB; Inhale 1 puff into the lungs daily.  Dispense: 28 each; Refill: 11  6. Esophageal dysphagia Chew food well  7. Gastroesophageal reflux disease without esophagitis Avoid spicy foods Do not eat 2 hours prior to bedtime  - pantoprazole (PROTONIX) 40 MG tablet; Take 1 tablet (40 mg total) by mouth daily.  Dispense: 30 tablet; Refill: 5  8. Neuropathy Do not go barefooted  9. Benign prostatic hyperplasia without lower urinary tract symptoms Report any voiding issues - finasteride (PROSCAR) 5 MG tablet; Take 1 tablet (5 mg total) by mouth daily.  Dispense: 90 tablet; Refill: 1 - terazosin (HYTRIN) 5 MG capsule; TAKE (1) CAPSULE DAILY  Dispense: 90 capsule; Refill: 1  10. Memory loss [place] daily - memantine (NAMENDA) 5 MG tablet; Take 1 tablet (5 mg total) by mouth 3 (three) times daily.  Dispense: 180 tablet; Refill: 0 - donepezil (ARICEPT) 10 MG tablet; Take 1 tablet (10 mg total) by mouth at bedtime.  Dispense: 90 tablet; Refill: 1  11. Vitamin B12 deficiency Continue b12  injections  12. Vitamin D deficiency  13. Primary  insomnia Bedtime routine - LORazepam (ATIVAN) 0.5 MG tablet; Take nightly as needed for sleep  Dispense: 30 tablet; Refill: 5  14. Acalculous cholecystitis  - ursodiol (ACTIGALL) 300 MG capsule; Take 1 capsule (300 mg total) by mouth 3 (three) times daily.  Dispense: 90 capsule; Refill: 11  15. Primary hypertension - losartan (COZAAR) 25 MG tablet; Take 0.5 tablets (12.5 mg total) by mouth daily.  Dispense: 45 tablet; Refill: 3   Labs pending Health Maintenance reviewed Diet and exercise encouraged  Follow up plan: 6 months   Mary-Margaret Hassell Done, FNP

## 2022-02-13 DIAGNOSIS — Z23 Encounter for immunization: Secondary | ICD-10-CM | POA: Diagnosis not present

## 2022-04-22 ENCOUNTER — Other Ambulatory Visit: Payer: Self-pay | Admitting: Nurse Practitioner

## 2022-04-22 DIAGNOSIS — R413 Other amnesia: Secondary | ICD-10-CM

## 2022-04-28 ENCOUNTER — Encounter: Payer: Self-pay | Admitting: Nurse Practitioner

## 2022-04-28 ENCOUNTER — Ambulatory Visit (INDEPENDENT_AMBULATORY_CARE_PROVIDER_SITE_OTHER): Payer: PRIVATE HEALTH INSURANCE | Admitting: Nurse Practitioner

## 2022-04-28 VITALS — BP 138/73 | HR 65 | Temp 97.2°F | Resp 20 | Ht 66.0 in | Wt 112.0 lb

## 2022-04-28 DIAGNOSIS — R634 Abnormal weight loss: Secondary | ICD-10-CM | POA: Diagnosis not present

## 2022-04-28 DIAGNOSIS — R300 Dysuria: Secondary | ICD-10-CM

## 2022-04-28 DIAGNOSIS — F03B0 Unspecified dementia, moderate, without behavioral disturbance, psychotic disturbance, mood disturbance, and anxiety: Secondary | ICD-10-CM

## 2022-04-28 LAB — MICROSCOPIC EXAMINATION
Bacteria, UA: NONE SEEN
RBC, Urine: NONE SEEN /hpf (ref 0–2)
Renal Epithel, UA: NONE SEEN /hpf

## 2022-04-28 LAB — URINALYSIS, COMPLETE
Bilirubin, UA: NEGATIVE
Glucose, UA: NEGATIVE
Ketones, UA: NEGATIVE
Leukocytes,UA: NEGATIVE
Nitrite, UA: NEGATIVE
RBC, UA: NEGATIVE
Specific Gravity, UA: >1.03 — ABNORMAL HIGH (ref 1.005–1.030)
Urobilinogen, Ur: 1 mg/dL (ref 0.2–1.0)
pH, UA: 5.5 (ref 5.0–7.5)

## 2022-04-28 NOTE — Patient Instructions (Signed)
Dementia Caregiver Guide Dementia is a term used to describe a number of symptoms that affect memory and thinking. The most common symptoms include: Memory loss. Trouble with language and communication. Trouble concentrating. Poor judgment and problems with reasoning. Wandering from home or public places. Extreme anxiety or depression. Being suspicious or having angry outbursts and accusations. Child-like behavior and language. Dementia can be frightening and confusing. And taking care of someone with dementia can be challenging. This guide provides tips to help you when providing care for a person with dementia. How to help manage lifestyle changes Dementia usually gets worse slowly over time. In the early stages, people with dementia can stay independent and safe with some help. In later stages, they need help with daily tasks such as dressing, grooming, and using the bathroom. There are actions you can take to help a person manage his or her life while living with this condition. Communicating When the person is talking or seems frustrated, make eye contact and hold the person's hand. Ask specific questions that need yes or no answers. Use simple words, short sentences, and a calm voice. Only give one direction at a time. When offering choices, limit the person to just one or two. Avoid correcting the person in a negative way. If the person is struggling to find the right words, gently try to help him or her. Preventing injury  Keep floors clear of clutter. Remove rugs, magazine racks, and floor lamps. Keep hallways well lit, especially at night. Put a handrail and nonslip mat in the bathtub or shower. Put childproof locks on cabinets that contain dangerous items, such as medicines, alcohol, guns, toxic cleaning items, sharp tools or utensils, matches, and lighters. For doors to the outside of the house, put the locks in places where the person cannot see or reach them easily. This will  help ensure that the person does not wander out of the house and get lost. Be prepared for emergencies. Keep a list of emergency phone numbers and addresses in a convenient area. Remove car keys and lock garage doors so that the person does not try to get in the car and drive. Have the person wear a bracelet that tracks locations and identifies the person as having memory problems. This should be worn at all times for safety. Helping with daily life  Keep the person on track with his or her routine. Try to identify areas where the person may need help. Be supportive, patient, calm, and encouraging. Gently remind the person that adjusting to changes takes time. Help with the tasks that the person has asked for help with. Keep the person involved in daily tasks and decisions as much as possible. Encourage conversation, but try not to get frustrated if the person struggles to find words or does not seem to appreciate your help. How to recognize stress Look for signs of stress in yourself and in the person you are caring for. If you notice signs of stress, take steps to manage it. Symptoms of stress include: Feeling anxious, irritable, frustrated, or angry. Denying that the person has dementia or that his or her symptoms will not improve. Feeling depressed, hopeless, or unappreciated. Difficulty sleeping. Difficulty concentrating. Developing stress-related health problems. Feeling like you have too little time for your own life. Follow these instructions at home: Take care of your health Make sure that you and the person you are caring for: Get regular sleep. Exercise regularly. Eat regular, nutritious meals. Take over-the-counter and prescription medicines only  as told by your health care providers. Drink enough fluid to keep your urine pale yellow. Attend all scheduled health care appointments.  General instructions Join a support group with others who are caregivers. Ask about  respite care resources. Respite care can provide short-term care for the person so that you can have a regular break from the stress of caregiving. Consider any safety risks and take steps to avoid them. Organize medicines in a pill box for each day of the week. Create a plan to handle any legal or financial matters. Get legal or financial advice if needed. Keep a calendar in a central location to remind the person of appointments or other activities. Where to find support: Many individuals and organizations offer support. These include: Support groups for people with dementia. Support groups for caregivers. Counselors or therapists. Home health care services. Adult day care centers. Where to find more information Centers for Disease Control and Prevention: http://www.wolf.info/ Alzheimer's Association: [employer] Family Caregiver Alliance: www.caregiver.[place]: www.alzfdn.org Contact a health care provider if: The person's health is rapidly getting worse. You are no longer able to care for the person. Caring for the person is affecting your physical and emotional health. You are feeling depressed or anxious about caring for the person. Get help right away if: The person threatens himself or herself, you, or anyone else. You feel depressed or sad, or feel that you want to harm yourself. If you ever feel like your loved one may hurt himself or herself or others, or if he or she shares thoughts about taking his or her own life, get help right away. You can go to your nearest emergency department or: Call your local emergency services (911 in the [place] Call a suicide crisis helpline, such as the [place] at 940-425-5877 or 988 in the [place]. This is open 24 hours a day in the [place] Text the Crisis Text Line at 442 451 7947 (in the [place].). Summary Dementia is a term used to describe a number of symptoms that affect memory and thinking. Dementia  usually gets worse slowly over time. Take steps to reduce the person's risk of injury and to plan for future care. Caregivers need support, relief from caregiving, and time for their own lives. This information is not intended to replace advice given to you by your health care provider. Make sure you discuss any questions you have with your health care provider. Document Revised: 09/18/2020 Document Reviewed: 07/10/2019 Elsevier Patient Education  [place].

## 2022-04-28 NOTE — Progress Notes (Signed)
   Subjective:    Patient ID: Carlos Brooks, male    DOB: 24-Jun-1926, 89 y.o.   MRN: [id number]   Chief Complaint: dysuria  Urinary Tract Infection  This is a new problem. The current episode started in the past 7 days. The problem occurs intermittently. The problem has been waxing and waning. The quality of the pain is described as burning. The pain is at a severity of 4/10. The pain is mild. There has been no fever. He is Not sexually active. There is No history of pyelonephritis. Associated symptoms include hesitancy and urgency. Pertinent negatives include no hematuria. He has tried nothing for the symptoms. The treatment provided no relief.   Confusion has worsened  Wt Readings from Last 3 Encounters:  04/28/22 112 lb (50.8 kg)  02/12/22 118 lb (53.5 kg)  12/05/21 125 lb 12.8 oz (57.1 kg)    Patient Active Problem List   Diagnosis Date Noted   Weakness of right leg 01/11/2020   Vitamin B12 deficiency    Acalculous cholecystitis    Vitamin D deficiency 06/15/2019   Tremor, coarse 02/17/2019   Memory loss 02/16/2019   Bruit 11/30/2018   Thoracic aorta atherosclerosis ([place]) 10/17/2018   Primary insomnia 10/17/2018   Controlled substance agreement signed 10/17/2018   LBBB (left bundle branch block) 07/21/2017   Primary osteoarthritis involving multiple joints 08/10/2016   Back pain 08/10/2016   Neuropathy 10/21/2015   Aortic atherosclerosis ([place]) 10/21/2015   Dysphagia 06/20/2007   Allergic rhinitis 04/28/2007   LUNG NODULE 04/28/2007   Hyperlipidemia LDL goal <100 03/28/2007   COPD (chronic obstructive pulmonary disease) with chronic bronchitis ([place]) 03/28/2007   Gastroesophageal reflux disease without esophagitis 03/28/2007   BPH (benign prostatic hyperplasia) 03/28/2007      Review of Systems  Genitourinary:  Positive for hesitancy and urgency. Negative for hematuria.       Objective:   Physical Exam Vitals reviewed.  Constitutional:      Appearance:  Normal appearance.  Cardiovascular:     Rate and Rhythm: Normal rate and regular rhythm.     Heart sounds: Normal heart sounds.  Pulmonary:     Effort: Pulmonary effort is normal.     Breath sounds: Normal breath sounds.  Skin:    General: Skin is warm.  Neurological:     General: No focal deficit present.     Mental Status: He is alert and oriented to person, place, and time.  Psychiatric:        Mood and Affect: Mood normal.        Behavior: Behavior normal.     Urine clear       Assessment & Plan:   RAAM LINDY in today with chief complaint of No chief complaint on file.   1. Dysuria - Urinalysis, Complete - Urine Culture  2. Moderate dementia without behavioral disturbance, psychotic disturbance, mood disturbance, or anxiety, unspecified dementia type ([place]) Orient as much as possible  3. Weight loss Increase calories Ensure daily    The above assessment and management plan was discussed with the patient. The patient verbalized understanding of and has agreed to the management plan. Patient is aware to call the clinic if symptoms persist or worsen. Patient is aware when to return to the clinic for a follow-up visit. Patient educated on when it is appropriate to go to the emergency department.   Mary-Margaret Hassell Done, FNP

## 2022-04-30 LAB — URINE CULTURE

## 2022-05-28 ENCOUNTER — Ambulatory Visit: Payer: PPO | Admitting: Internal Medicine

## 2022-06-17 ENCOUNTER — Telehealth: Payer: Self-pay | Admitting: Nurse Practitioner

## 2022-06-17 NOTE — Telephone Encounter (Signed)
Carlos Brooks with Carlos Brooks called stating that patients BP has been trending down for the last 2 weeks. BP today was 88/53. She wants to know if they are ok holding isosorbide for now and continue to monitor his bp.

## 2022-06-17 NOTE — Telephone Encounter (Signed)
That is ok if he is not symptomatic or having any bleeding.

## 2022-06-17 NOTE — Telephone Encounter (Signed)
Carlos Brooks aware of provider feedback and voiced understanding.

## 2022-06-24 ENCOUNTER — Other Ambulatory Visit: Payer: Self-pay | Admitting: Nurse Practitioner

## 2022-06-24 DIAGNOSIS — R413 Other amnesia: Secondary | ICD-10-CM

## 2022-06-30 ENCOUNTER — Other Ambulatory Visit: Payer: Self-pay | Admitting: Nurse Practitioner

## 2022-06-30 DIAGNOSIS — F5101 Primary insomnia: Secondary | ICD-10-CM

## 2022-06-30 NOTE — Telephone Encounter (Signed)
pharmacy: NEED NEW RX. PT ONLY GETS 15 TABS/FILL AND WE CAN ONLY REFILL 5 TIMES. THANKS  Last OV & RF 02/13/23 Next OV 08/21/22

## 2022-07-08 ENCOUNTER — Other Ambulatory Visit: Payer: Self-pay | Admitting: Student

## 2022-08-04 ENCOUNTER — Other Ambulatory Visit: Payer: Self-pay | Admitting: Student

## 2022-08-19 ENCOUNTER — Other Ambulatory Visit: Payer: Self-pay | Admitting: Cardiology

## 2022-08-19 ENCOUNTER — Other Ambulatory Visit: Payer: Self-pay | Admitting: Nurse Practitioner

## 2022-08-19 DIAGNOSIS — K219 Gastro-esophageal reflux disease without esophagitis: Secondary | ICD-10-CM

## 2022-08-21 ENCOUNTER — Ambulatory Visit: Payer: PPO | Admitting: Nurse Practitioner

## 2022-08-26 ENCOUNTER — Other Ambulatory Visit: Payer: Self-pay | Admitting: Nurse Practitioner

## 2022-08-26 DIAGNOSIS — N400 Enlarged prostate without lower urinary tract symptoms: Secondary | ICD-10-CM

## 2022-08-29 ENCOUNTER — Other Ambulatory Visit: Payer: Self-pay | Admitting: Nurse Practitioner

## 2022-08-29 DIAGNOSIS — J301 Allergic rhinitis due to pollen: Secondary | ICD-10-CM

## 2022-09-17 ENCOUNTER — Other Ambulatory Visit: Payer: Self-pay | Admitting: Nurse Practitioner

## 2022-09-17 ENCOUNTER — Other Ambulatory Visit: Payer: Self-pay | Admitting: Cardiology

## 2022-09-17 DIAGNOSIS — N400 Enlarged prostate without lower urinary tract symptoms: Secondary | ICD-10-CM

## 2022-09-17 DIAGNOSIS — R413 Other amnesia: Secondary | ICD-10-CM

## 2022-09-17 DIAGNOSIS — K219 Gastro-esophageal reflux disease without esophagitis: Secondary | ICD-10-CM

## 2022-10-21 ENCOUNTER — Telehealth: Payer: Self-pay | Admitting: *Deleted

## 2022-10-21 NOTE — Telephone Encounter (Signed)
I connected with pt daughter on 8/14 at [phone] by telephone and verified that I am speaking with the correct person using two identifiers. According to the patient's chart they are due for follow up  with [facility]. Pt is under pallative care. Nothing further was needed at the end of our conversation.

## 2022-10-31 ENCOUNTER — Other Ambulatory Visit: Payer: Self-pay | Admitting: Nurse Practitioner

## 2022-10-31 DIAGNOSIS — R413 Other amnesia: Secondary | ICD-10-CM

## 2023-03-11 ENCOUNTER — Telehealth: Payer: Self-pay | Admitting: Nurse Practitioner

## 2023-03-11 DIAGNOSIS — Z0279 Encounter for issue of other medical certificate: Secondary | ICD-10-CM

## 2023-03-11 NOTE — Telephone Encounter (Signed)
 Nathanel Core (nurse from [employer]) faxed off FMLA forms to be completed and signed.  Form Fee Paid? (Y/N) yes         If NO, form is placed on front office manager desk to hold until payment received. If YES, then form will be placed in the RX/HH Nurse Coordinators box for completion.  Form will not be processed until payment is received

## 2023-03-12 NOTE — Telephone Encounter (Signed)
 Form completed and sent to Bennie Pierini Copy of payment sent to [facility]

## 2023-03-16 NOTE — Telephone Encounter (Signed)
PCP completed and signed FMLA forms. They have been faxed to [employer] at fax number [phone]. Patient has been contacted and informed they are complete.  

## 2023-07-08 DEATH — deceased

## 2023-09-16 ENCOUNTER — Encounter: Payer: Self-pay | Admitting: Nurse Practitioner
